# Patient Record
Sex: Female | Born: 1955
Health system: Southern US, Community
[De-identification: ages and names within clinical notes are randomized; demographics above are authoritative.]

## PROBLEM LIST (undated history)

## (undated) DIAGNOSIS — E785 Hyperlipidemia, unspecified: Secondary | ICD-10-CM

## (undated) DIAGNOSIS — J45909 Unspecified asthma, uncomplicated: Secondary | ICD-10-CM

## (undated) DIAGNOSIS — R5381 Other malaise: Secondary | ICD-10-CM

## (undated) DIAGNOSIS — E559 Vitamin D deficiency, unspecified: Secondary | ICD-10-CM

## (undated) DIAGNOSIS — R569 Unspecified convulsions: Secondary | ICD-10-CM

## (undated) DIAGNOSIS — I10 Essential (primary) hypertension: Secondary | ICD-10-CM

## (undated) DIAGNOSIS — I498 Other specified cardiac arrhythmias: Secondary | ICD-10-CM

## (undated) DIAGNOSIS — M1990 Unspecified osteoarthritis, unspecified site: Secondary | ICD-10-CM

## (undated) DIAGNOSIS — G40909 Epilepsy, unspecified, not intractable, without status epilepticus: Secondary | ICD-10-CM

## (undated) DIAGNOSIS — Z9289 Personal history of other medical treatment: Secondary | ICD-10-CM

## (undated) DIAGNOSIS — D649 Anemia, unspecified: Secondary | ICD-10-CM

## (undated) DIAGNOSIS — T7840XA Allergy, unspecified, initial encounter: Secondary | ICD-10-CM

## (undated) DIAGNOSIS — R001 Bradycardia, unspecified: Secondary | ICD-10-CM

## (undated) DIAGNOSIS — R5383 Other fatigue: Secondary | ICD-10-CM

## (undated) HISTORY — DX: Hyperlipidemia, unspecified: E78.5

## (undated) HISTORY — DX: Allergy, unspecified, initial encounter: T78.40XA

## (undated) HISTORY — DX: Unspecified osteoarthritis, unspecified site: M19.90

## (undated) HISTORY — DX: Other malaise: R53.83

## (undated) HISTORY — PX: VAGINAL HYSTERECTOMY: SUR661

## (undated) HISTORY — DX: Essential (primary) hypertension: I10

## (undated) HISTORY — DX: Anemia, unspecified: D64.9

## (undated) HISTORY — PX: APPENDECTOMY: SHX54

## (undated) HISTORY — PX: ABDOMINAL HYSTERECTOMY: SHX81

## (undated) HISTORY — DX: Vitamin D deficiency, unspecified: E55.9

## (undated) HISTORY — DX: Other specified cardiac arrhythmias: I49.8

## (undated) HISTORY — PX: BREAST EXCISIONAL BIOPSY: SUR124

## (undated) HISTORY — DX: Other malaise: R53.81

## (undated) HISTORY — DX: Unspecified convulsions: R56.9

## (undated) HISTORY — PX: BREAST LUMPECTOMY: SHX2

## (undated) HISTORY — DX: Bradycardia, unspecified: R00.1

---

## 1995-06-26 HISTORY — PX: BREAST LUMPECTOMY: SHX2

## 2004-06-25 DIAGNOSIS — I498 Other specified cardiac arrhythmias: Secondary | ICD-10-CM

## 2004-06-25 HISTORY — DX: Other specified cardiac arrhythmias: I49.8

## 2008-08-04 ENCOUNTER — Emergency Department (HOSPITAL_COMMUNITY): Admission: EM | Admit: 2008-08-04 | Discharge: 2008-08-04 | Payer: Self-pay | Admitting: Family Medicine

## 2008-09-03 ENCOUNTER — Emergency Department (HOSPITAL_COMMUNITY): Admission: EM | Admit: 2008-09-03 | Discharge: 2008-09-03 | Payer: Self-pay | Admitting: Family Medicine

## 2009-02-01 ENCOUNTER — Emergency Department (HOSPITAL_COMMUNITY): Admission: EM | Admit: 2009-02-01 | Discharge: 2009-02-01 | Payer: Self-pay | Admitting: Emergency Medicine

## 2009-02-16 ENCOUNTER — Ambulatory Visit: Payer: Self-pay | Admitting: Internal Medicine

## 2009-02-16 ENCOUNTER — Encounter (INDEPENDENT_AMBULATORY_CARE_PROVIDER_SITE_OTHER): Payer: Self-pay | Admitting: Family Medicine

## 2009-02-16 LAB — CONVERTED CEMR LAB
ALT: 23 units/L (ref 0–35)
CO2: 22 meq/L (ref 19–32)
Calcium: 9.2 mg/dL (ref 8.4–10.5)
Chloride: 105 meq/L (ref 96–112)
Cholesterol: 217 mg/dL — ABNORMAL HIGH (ref 0–200)
Sodium: 141 meq/L (ref 135–145)
Total Protein: 7.9 g/dL (ref 6.0–8.3)
VLDL: 40 mg/dL (ref 0–40)

## 2009-02-21 ENCOUNTER — Ambulatory Visit: Payer: Self-pay | Admitting: *Deleted

## 2009-03-03 ENCOUNTER — Ambulatory Visit: Payer: Self-pay | Admitting: Internal Medicine

## 2009-04-14 ENCOUNTER — Ambulatory Visit: Payer: Self-pay | Admitting: Family Medicine

## 2009-04-14 ENCOUNTER — Encounter (INDEPENDENT_AMBULATORY_CARE_PROVIDER_SITE_OTHER): Payer: Self-pay | Admitting: Family Medicine

## 2009-04-14 LAB — CONVERTED CEMR LAB
Albumin: 4.3 g/dL (ref 3.5–5.2)
Alkaline Phosphatase: 70 units/L (ref 39–117)
CO2: 23 meq/L (ref 19–32)
Glucose, Bld: 141 mg/dL — ABNORMAL HIGH (ref 70–99)
LDL Cholesterol: 122 mg/dL — ABNORMAL HIGH (ref 0–99)
Potassium: 4 meq/L (ref 3.5–5.3)
Sodium: 142 meq/L (ref 135–145)
Total Protein: 7.1 g/dL (ref 6.0–8.3)
Triglycerides: 106 mg/dL (ref <150)

## 2009-05-04 ENCOUNTER — Ambulatory Visit: Payer: Self-pay | Admitting: Internal Medicine

## 2009-05-06 ENCOUNTER — Ambulatory Visit: Payer: Self-pay | Admitting: Internal Medicine

## 2009-05-18 ENCOUNTER — Ambulatory Visit (HOSPITAL_COMMUNITY): Admission: RE | Admit: 2009-05-18 | Discharge: 2009-05-18 | Payer: Self-pay | Admitting: Internal Medicine

## 2009-05-30 ENCOUNTER — Encounter (INDEPENDENT_AMBULATORY_CARE_PROVIDER_SITE_OTHER): Payer: Self-pay | Admitting: Adult Health

## 2009-05-30 ENCOUNTER — Ambulatory Visit: Payer: Self-pay | Admitting: Internal Medicine

## 2009-05-30 LAB — CONVERTED CEMR LAB
CO2: 22 meq/L (ref 19–32)
Chloride: 106 meq/L (ref 96–112)
Creatinine, Ser: 0.71 mg/dL (ref 0.40–1.20)
Potassium: 3.7 meq/L (ref 3.5–5.3)

## 2009-07-22 ENCOUNTER — Ambulatory Visit: Payer: Self-pay | Admitting: Internal Medicine

## 2009-07-22 ENCOUNTER — Encounter (INDEPENDENT_AMBULATORY_CARE_PROVIDER_SITE_OTHER): Payer: Self-pay | Admitting: Adult Health

## 2009-07-22 LAB — CONVERTED CEMR LAB
Albumin: 4.3 g/dL (ref 3.5–5.2)
BUN: 8 mg/dL (ref 6–23)
CO2: 23 meq/L (ref 19–32)
Calcium: 9.1 mg/dL (ref 8.4–10.5)
Chloride: 103 meq/L (ref 96–112)
Cholesterol: 191 mg/dL (ref 0–200)
Glucose, Bld: 168 mg/dL — ABNORMAL HIGH (ref 70–99)
HDL: 53 mg/dL (ref >39)
Potassium: 3.7 meq/L (ref 3.5–5.3)
Triglycerides: 118 mg/dL (ref <150)

## 2010-05-23 ENCOUNTER — Ambulatory Visit: Payer: Self-pay | Admitting: Cardiology

## 2010-05-23 ENCOUNTER — Inpatient Hospital Stay (HOSPITAL_COMMUNITY): Admission: AC | Admit: 2010-05-23 | Discharge: 2010-05-26 | Payer: Self-pay

## 2010-05-25 ENCOUNTER — Encounter: Payer: Self-pay | Admitting: Cardiology

## 2010-09-05 LAB — GLUCOSE, CAPILLARY
Glucose-Capillary: 102 mg/dL — ABNORMAL HIGH (ref 70–99)
Glucose-Capillary: 228 mg/dL — ABNORMAL HIGH (ref 70–99)
Glucose-Capillary: 257 mg/dL — ABNORMAL HIGH (ref 70–99)
Glucose-Capillary: 266 mg/dL — ABNORMAL HIGH (ref 70–99)
Glucose-Capillary: 354 mg/dL — ABNORMAL HIGH (ref 70–99)
Glucose-Capillary: 167 mg/dL — ABNORMAL HIGH (ref 70–99)
Glucose-Capillary: 178 mg/dL — ABNORMAL HIGH (ref 70–99)
Glucose-Capillary: 227 mg/dL — ABNORMAL HIGH (ref 70–99)
Glucose-Capillary: 308 mg/dL — ABNORMAL HIGH (ref 70–99)
Glucose-Capillary: 316 mg/dL — ABNORMAL HIGH (ref 70–99)

## 2010-09-05 LAB — POCT I-STAT, CHEM 8
BUN: 9 mg/dL (ref 6–23)
Calcium, Ion: 1.09 mmol/L — ABNORMAL LOW (ref 1.12–1.32)
Glucose, Bld: 311 mg/dL — ABNORMAL HIGH (ref 70–99)
HCT: 43 % (ref 36.0–46.0)
TCO2: 22 mmol/L (ref 0–100)

## 2010-09-05 LAB — CARDIAC PANEL(CRET KIN+CKTOT+MB+TROPI)
CK, MB: 6.5 ng/mL (ref 0.3–4.0)
CK, MB: 9.1 ng/mL (ref 0.3–4.0)
Relative Index: 1.4 (ref 0.0–2.5)
Total CK: 1045 U/L — ABNORMAL HIGH (ref 7–177)
Total CK: 951 U/L — ABNORMAL HIGH (ref 7–177)

## 2010-09-05 LAB — BASIC METABOLIC PANEL
BUN: 9 mg/dL (ref 6–23)
CO2: 26 mEq/L (ref 19–32)
Calcium: 8.2 mg/dL — ABNORMAL LOW (ref 8.4–10.5)
Chloride: 107 mEq/L (ref 96–112)
Creatinine, Ser: 0.57 mg/dL (ref 0.4–1.2)
GFR calc Af Amer: >60 mL/min (ref >60)
GFR calc non Af Amer: >60 mL/min (ref >60)
Glucose, Bld: 263 mg/dL — ABNORMAL HIGH (ref 70–99)
Potassium: 4.3 mEq/L (ref 3.5–5.1)
Sodium: 139 mEq/L (ref 135–145)
Sodium: 139 mEq/L (ref 135–145)

## 2010-09-05 LAB — COMPREHENSIVE METABOLIC PANEL
Albumin: 3.5 g/dL (ref 3.5–5.2)
Alkaline Phosphatase: 99 U/L (ref 39–117)
BUN: 9 mg/dL (ref 6–23)
Calcium: 9.1 mg/dL (ref 8.4–10.5)
Glucose, Bld: 302 mg/dL — ABNORMAL HIGH (ref 70–99)
Potassium: 3.4 meq/L — ABNORMAL LOW (ref 3.5–5.1)
Total Protein: 7.2 g/dL (ref 6.0–8.3)

## 2010-09-05 LAB — CBC
HCT: 39.9 % (ref 36.0–46.0)
MCH: 25.5 pg — ABNORMAL LOW (ref 26.0–34.0)
MCHC: 32.3 g/dL (ref 30.0–36.0)
MCHC: 32.3 g/dL (ref 30.0–36.0)
MCV: 78.9 fL (ref 78.0–100.0)
MCV: 79 fL (ref 78.0–100.0)
Platelets: 187 10*3/uL (ref 150–400)
Platelets: 227 10*3/uL (ref 150–400)
RBC: 4.27 MIL/uL (ref 3.87–5.11)
RDW: 14 % (ref 11.5–15.5)
WBC: 11.1 10*3/uL — ABNORMAL HIGH (ref 4.0–10.5)

## 2010-09-05 LAB — PROTIME-INR
INR: 0.98 (ref 0.00–1.49)
Prothrombin Time: 13.2 s (ref 11.6–15.2)

## 2010-09-05 LAB — LIPID PANEL
Triglycerides: 132 mg/dL (ref <150)
VLDL: 26 mg/dL (ref 0–40)

## 2010-09-05 LAB — CK TOTAL AND CKMB (NOT AT ARMC)
CK, MB: 10.5 ng/mL (ref 0.3–4.0)
Relative Index: 1.6 (ref 0.0–2.5)
Total CK: 639 U/L — ABNORMAL HIGH (ref 7–177)

## 2010-09-05 LAB — HEMOGLOBIN A1C
Hgb A1c MFr Bld: 12.8 % — ABNORMAL HIGH (ref <5.7)
Mean Plasma Glucose: 321 mg/dL — ABNORMAL HIGH (ref <117)

## 2010-09-05 LAB — BRAIN NATRIURETIC PEPTIDE: Pro B Natriuretic peptide (BNP): <30 pg/mL (ref 0.0–100.0)

## 2010-09-05 LAB — LACTIC ACID, PLASMA: Lactic Acid, Venous: 6.2 mmol/L — ABNORMAL HIGH (ref 0.5–2.2)

## 2010-10-10 LAB — POCT I-STAT, CHEM 8
Calcium, Ion: 1.14 mmol/L (ref 1.12–1.32)
Chloride: 105 mEq/L (ref 96–112)
HCT: 42 % (ref 36.0–46.0)
Sodium: 141 mEq/L (ref 135–145)
TCO2: 26 mmol/L (ref 0–100)

## 2011-05-07 ENCOUNTER — Encounter: Payer: Self-pay | Admitting: *Deleted

## 2011-05-07 ENCOUNTER — Emergency Department (HOSPITAL_COMMUNITY)
Admission: EM | Admit: 2011-05-07 | Discharge: 2011-05-07 | Disposition: A | Discharge status: Home / Self Care | Payer: 59 | Attending: Emergency Medicine | Admitting: Emergency Medicine

## 2011-05-07 ENCOUNTER — Other Ambulatory Visit: Payer: Self-pay

## 2011-05-07 DIAGNOSIS — Z794 Long term (current) use of insulin: Secondary | ICD-10-CM | POA: Insufficient documentation

## 2011-05-07 DIAGNOSIS — E119 Type 2 diabetes mellitus without complications: Secondary | ICD-10-CM | POA: Insufficient documentation

## 2011-05-07 DIAGNOSIS — Z9114 Patient's other noncompliance with medication regimen: Secondary | ICD-10-CM

## 2011-05-07 DIAGNOSIS — Z79899 Other long term (current) drug therapy: Secondary | ICD-10-CM | POA: Insufficient documentation

## 2011-05-07 DIAGNOSIS — Z91199 Patient's noncompliance with other medical treatment and regimen due to unspecified reason: Secondary | ICD-10-CM | POA: Insufficient documentation

## 2011-05-07 DIAGNOSIS — Z9119 Patient's noncompliance with other medical treatment and regimen: Secondary | ICD-10-CM | POA: Insufficient documentation

## 2011-05-07 DIAGNOSIS — F29 Unspecified psychosis not due to a substance or known physiological condition: Secondary | ICD-10-CM | POA: Insufficient documentation

## 2011-05-07 DIAGNOSIS — G40909 Epilepsy, unspecified, not intractable, without status epilepticus: Secondary | ICD-10-CM | POA: Insufficient documentation

## 2011-05-07 DIAGNOSIS — I10 Essential (primary) hypertension: Secondary | ICD-10-CM | POA: Insufficient documentation

## 2011-05-07 DIAGNOSIS — R569 Unspecified convulsions: Secondary | ICD-10-CM

## 2011-05-07 HISTORY — DX: Epilepsy, unspecified, not intractable, without status epilepticus: G40.909

## 2011-05-07 LAB — URINALYSIS, ROUTINE W REFLEX MICROSCOPIC
Bilirubin Urine: NEGATIVE
Glucose, UA: 250 mg/dL — AB
Hgb urine dipstick: NEGATIVE
Specific Gravity, Urine: 1.021 (ref 1.005–1.030)
Urobilinogen, UA: 0.2 mg/dL (ref 0.0–1.0)
pH: 6 (ref 5.0–8.0)

## 2011-05-07 LAB — URINE MICROSCOPIC-ADD ON

## 2011-05-07 LAB — POCT I-STAT, CHEM 8
BUN: 9 mg/dL (ref 6–23)
Chloride: 106 mEq/L (ref 96–112)
Creatinine, Ser: 0.7 mg/dL (ref 0.50–1.10)
Glucose, Bld: 171 mg/dL — ABNORMAL HIGH (ref 70–99)
Potassium: 3.2 mEq/L — ABNORMAL LOW (ref 3.5–5.1)
Sodium: 141 mEq/L (ref 135–145)

## 2011-05-07 MED ORDER — SODIUM CHLORIDE 0.9 % IV SOLN
Freq: Once | INTRAVENOUS | Status: AC
  Administered 2011-05-07: 11:00:00 via INTRAVENOUS

## 2011-05-07 MED ORDER — LEVETIRACETAM 500 MG PO TABS: 500.0000 mg | ORAL_TABLET | Freq: Two times a day (BID) | ORAL | Status: DC

## 2011-05-07 MED ORDER — LEVETIRACETAM 500 MG PO TABS
500.0000 mg | ORAL_TABLET | Freq: Once | ORAL | Status: AC
  Administered 2011-05-07: 500 mg via ORAL
  Filled 2011-05-07: qty 1

## 2011-05-07 NOTE — ED Notes (Signed)
[facility] PTA 167

## 2011-05-07 NOTE — ED Notes (Signed)
Patients ekg given to dr. Patrica Duel.

## 2011-05-07 NOTE — ED Provider Notes (Signed)
Medical screening examination/treatment/procedure(s) were performed by non-physician practitioner and as supervising physician I was immediately available for consultation/collaboration.   Jerrell Hart A. Patrica Duel, MD 05/07/11 1556

## 2011-05-07 NOTE — ED Notes (Signed)
Family at bedside. 

## 2011-05-07 NOTE — ED Notes (Signed)
While at work, she yelled out, fell from standing position & had seizure. Witnessed

## 2011-05-07 NOTE — ED Notes (Signed)
Patient is resting comfortably. 

## 2011-05-07 NOTE — ED Notes (Signed)
Patient is resting comfortably. No sei

## 2011-05-07 NOTE — ED Provider Notes (Signed)
History     CSN: [id number] Arrival date & time: 05/07/2011 10:21 AM   First MD Initiated Contact with Patient 05/07/11 1043      Chief Complaint  Patient presents with  . Seizures    (Consider location/radiation/quality/duration/timing/severity/associated sxs/prior treatment) Patient is a 55 y.o. female presenting with seizures. The history is provided by the patient.  Seizures  This is a recurrent problem. The current episode started 1 to 2 hours ago. The problem has been gradually improving. There was 1 (The patient has a history of seizures and has had no Keppra since May of this year. This is the first seizure in that time. ) seizure. Associated symptoms include confusion. The seizures did not continue in the ED. Possible causes include missed seizure meds. There has been no fever.    Past Medical History  Diagnosis Date  . Diabetes mellitus   . Seizure disorder     No past surgical history on file.  No family history on file.  History  Substance Use Topics  . Smoking status: Never Smoker   . Smokeless tobacco: Not on file  . Alcohol Use: No    OB History    Grav Para Term Preterm Abortions TAB SAB Ect Mult Living                  Review of Systems  Constitutional: Negative for fever and chills.  HENT: Negative.   Respiratory: Negative.   Cardiovascular: Negative.   Gastrointestinal: Negative.   Musculoskeletal: Negative.   Skin: Negative.   Neurological: Positive for seizures.  Psychiatric/Behavioral: Positive for confusion.    Allergies  Review of patient's allergies indicates not on file.  Home Medications   Current Outpatient Rx  Name Route Sig Dispense Refill  . INSULIN GLARGINE 100 UNIT/ML [place] SOLN Subcutaneous Inject into the skin at bedtime. Per sliding scale     . LEVETIRACETAM 500 MG PO TABS Oral Take 500 mg by mouth 2 (two) times daily.      Marland Kitchen LISINOPRIL 10 MG PO TABS Oral Take 10 mg by mouth daily.      Marland Kitchen METOPROLOL TARTRATE 25 MG PO  TABS Oral Take 25 mg by mouth 2 (two) times daily.      Marland Kitchen NAPROXEN 500 MG PO TABS Oral Take 500 mg by mouth 2 (two) times daily with a meal.      . SAXAGLIPTIN-METFORMIN 2.10-998 MG PO TB24 Oral Take 2 tablets by mouth every evening. With meals       BP 144/94  Pulse 108  Temp(Src) 98 F (36.7 C) (Oral)  Resp 17  SpO2 100%  Physical Exam  Constitutional: She is oriented to person, place, and time. She appears well-developed and well-nourished.  HENT:  Head: Normocephalic.  Eyes: Pupils are equal, round, and reactive to light.  Neck: Normal range of motion. Neck supple.  Cardiovascular: Normal rate and regular rhythm.   Pulmonary/Chest: Effort normal and breath sounds normal.  Abdominal: Soft. Bowel sounds are normal. There is no tenderness. There is no rebound and no guarding.  Musculoskeletal: Normal range of motion.  Neurological: She is alert and oriented to person, place, and time. She has normal strength and normal reflexes. No cranial nerve deficit or sensory deficit. She displays a negative Romberg sign. Coordination normal.  Skin: Skin is warm and dry. No rash noted.  Psychiatric: She has a normal mood and affect.    ED Course  Procedures (including critical care time)  Labs Reviewed  POCT  I-STAT, CHEM 8 - Abnormal; Notable for the following:    Potassium 3.2 (*)    Glucose, Bld 171 (*)    All other components within normal limits  I-STAT, CHEM 8  URINALYSIS, ROUTINE W REFLEX MICROSCOPIC   No results found.   No diagnosis found.    MDM   Date: 05/07/2011  Rate: 117  Rhythm: sinus tachycardia  QRS Axis: normal  Intervals: normal  ST/T Wave abnormalities: nonspecific T wave changes  Conduction Disutrbances:none  Narrative Interpretation:   Old EKG Reviewed: none available Results for orders placed during the hospital encounter of 05/07/11  POCT I-STAT, CHEM 8      Component Value Range   Sodium 141  135 - 145 (mEq/L)   Potassium 3.2 (*) 3.5 - 5.1  (mEq/L)   Chloride 106  96 - 112 (mEq/L)   BUN 9  6 - 23 (mg/dL)   Creatinine, Ser 1.61  0.50 - 1.10 (mg/dL)   Glucose, Bld 096 (*) 70 - 99 (mg/dL)   Calcium, Ion 0.45  4.09 - 1.32 (mmol/L)   TCO2 20  0 - 100 (mmol/L)   Hemoglobin 13.3  12.0 - 15.0 (g/dL)   HCT 81.1  91.4 - 78.2 (%)     Labs normal; no seizure activity in ED. Patient can be discharged home with Keppra Rx. Referred to PCP and to neurology.        Rodena Medin, PA 05/07/11 [phone]

## 2011-05-07 NOTE — ED Notes (Signed)
Witnessed tonic clonic seizure lasting approx 45 seconds. Pt denies injury or pain. No oral trauma or urinary incontinence.  Pt reports has not had meds for "a few months".

## 2011-05-07 NOTE — ED Notes (Signed)
Attempting to obtain info regarding meds, history, allergies & to notify family

## 2011-05-07 NOTE — ED Notes (Signed)
PCP not admitting provider, entered in error

## 2011-05-29 ENCOUNTER — Other Ambulatory Visit: Payer: Self-pay | Admitting: Internal Medicine

## 2011-05-29 DIAGNOSIS — Z1231 Encounter for screening mammogram for malignant neoplasm of breast: Secondary | ICD-10-CM

## 2011-06-08 ENCOUNTER — Ambulatory Visit: Payer: 59

## 2012-09-11 ENCOUNTER — Other Ambulatory Visit: Payer: Self-pay | Admitting: *Deleted

## 2012-09-11 MED ORDER — HYDROCHLOROTHIAZIDE 25 MG PO TABS: 25.0000 mg | ORAL_TABLET | Freq: Every day | ORAL | Status: DC

## 2012-09-11 MED ORDER — LISINOPRIL 20 MG PO TABS: ORAL_TABLET | ORAL | Status: DC

## 2012-09-16 ENCOUNTER — Other Ambulatory Visit: Payer: Self-pay | Admitting: Geriatric Medicine

## 2012-09-16 MED ORDER — HYDROCHLOROTHIAZIDE 25 MG PO TABS: 25.0000 mg | ORAL_TABLET | Freq: Every day | ORAL | Status: DC

## 2012-09-16 MED ORDER — LISINOPRIL 20 MG PO TABS: ORAL_TABLET | ORAL | Status: DC

## 2012-11-04 ENCOUNTER — Ambulatory Visit: Payer: Self-pay | Admitting: Internal Medicine

## 2012-12-10 ENCOUNTER — Other Ambulatory Visit: Payer: Self-pay | Admitting: Internal Medicine

## 2012-12-12 ENCOUNTER — Other Ambulatory Visit: Payer: Self-pay | Admitting: Geriatric Medicine

## 2012-12-29 ENCOUNTER — Other Ambulatory Visit: Payer: Self-pay | Admitting: *Deleted

## 2012-12-29 ENCOUNTER — Encounter: Payer: Self-pay | Admitting: *Deleted

## 2012-12-30 ENCOUNTER — Ambulatory Visit: Payer: Self-pay | Admitting: Internal Medicine

## 2013-01-12 ENCOUNTER — Encounter: Payer: Self-pay | Admitting: *Deleted

## 2013-01-12 ENCOUNTER — Other Ambulatory Visit: Payer: Self-pay | Admitting: Internal Medicine

## 2013-01-13 ENCOUNTER — Ambulatory Visit (INDEPENDENT_AMBULATORY_CARE_PROVIDER_SITE_OTHER): Payer: 59 | Admitting: Internal Medicine

## 2013-01-13 ENCOUNTER — Other Ambulatory Visit: Payer: Self-pay | Admitting: Internal Medicine

## 2013-01-13 ENCOUNTER — Encounter: Payer: Self-pay | Admitting: Internal Medicine

## 2013-01-13 VITALS — BP 142/90 | HR 78 | Temp 98.2°F | Resp 14 | Wt 176.2 lb

## 2013-01-13 DIAGNOSIS — D649 Anemia, unspecified: Secondary | ICD-10-CM

## 2013-01-13 DIAGNOSIS — M549 Dorsalgia, unspecified: Secondary | ICD-10-CM

## 2013-01-13 DIAGNOSIS — G40909 Epilepsy, unspecified, not intractable, without status epilepticus: Secondary | ICD-10-CM

## 2013-01-13 DIAGNOSIS — E119 Type 2 diabetes mellitus without complications: Secondary | ICD-10-CM

## 2013-01-13 DIAGNOSIS — I10 Essential (primary) hypertension: Secondary | ICD-10-CM

## 2013-01-13 MED ORDER — LEVETIRACETAM 500 MG PO TABS: 500.0000 mg | ORAL_TABLET | Freq: Two times a day (BID) | ORAL | Status: DC

## 2013-01-13 MED ORDER — HYDROCHLOROTHIAZIDE 25 MG PO TABS: 25.0000 mg | ORAL_TABLET | Freq: Two times a day (BID) | ORAL | Status: DC

## 2013-01-13 MED ORDER — TRAMADOL HCL 50 MG PO TABS: 50.0000 mg | ORAL_TABLET | Freq: Two times a day (BID) | ORAL | Status: DC | PRN

## 2013-01-13 NOTE — Progress Notes (Signed)
Patient ID: Sarah Brock, female   DOB: [birth year], 57 y.o.   MRN: [id number]   Code Status: full code  Allergies  Allergen Reactions  . Shrimp (Shellfish Allergy)     Chief Complaint  Patient presents with  . Medical Managment of Chronic Issues    HPI:  57 y/o female patient is here for routine visit. She remains seizure free. Se has not checked her bp at home. bp elevated this office visit. Blood sugar controlled and , 200. Compliant with her meds  Review of Systems  Constitutional: Negative for fever, chills, weight loss, malaise/fatigue and diaphoresis.  HENT: Negative for congestion.   Eyes: Negative for blurred vision.  Respiratory: Negative for cough and shortness of breath.   Cardiovascular: Negative for chest pain and leg swelling.  Gastrointestinal: Negative for heartburn, abdominal pain, diarrhea and constipation.  Genitourinary: Negative for dysuria and flank pain.  Musculoskeletal: Negative for myalgias and falls.  Skin: Negative for itching and rash.  Neurological: Negative for dizziness, sensory change, weakness and headaches.  Psychiatric/Behavioral: Negative for depression and memory loss. The patient is not nervous/anxious.      Past Medical History  Diagnosis Date  . Diabetes mellitus   . Seizure disorder   . Other specified cardiac dysrhythmias(427.89)   . Osteoarthrosis, unspecified whether generalized or localized, unspecified site   . Unspecified vitamin D deficiency   . Other and unspecified hyperlipidemia   . Anemia, unspecified   . Unspecified essential hypertension   . Other convulsions   . Other malaise and fatigue    Past Surgical History  Procedure Laterality Date  . Abdominal hysterectomy    . Breast lumpectomy Right 1997   Social History:   reports that she has never smoked. She does not have any smokeless tobacco history on file. She reports that she does not drink alcohol or use illicit drugs.  Family History  Problem Relation  Age of Onset  . Cancer Father     Esophageal    Medications: Patient's Medications  New Prescriptions   No medications on file  Previous Medications   ASPIRIN 81 MG TABLET    Take 81 mg by mouth daily.   HYDROCHLOROTHIAZIDE (HYDRODIURIL) 25 MG TABLET    Take 1 tablet (25 mg total) by mouth daily.   LEVETIRACETAM (KEPPRA) 500 MG TABLET    Take 1 tablet (500 mg total) by mouth every 12 (twelve) hours.   LISINOPRIL (PRINIVIL,ZESTRIL) 20 MG TABLET    Take one tablet by mouth once daily for blood pressure   METFORMIN (GLUCOPHAGE-XR) 500 MG 24 HR TABLET    TAKE 1 TABLET BY MOUTH DAILY   NAPROXEN (NAPROSYN) 500 MG TABLET    Take 500 mg by mouth 2 (two) times daily with a meal.    Modified Medications   No medications on file  Discontinued Medications   INSULIN GLARGINE (LANTUS) 100 UNIT/ML INJECTION    Inject into the skin at bedtime. Per sliding scale    METOPROLOL TARTRATE (LOPRESSOR) 25 MG TABLET    Take 25 mg by mouth 2 (two) times daily.     SAXAGLIPTIN-METFORMIN (KOMBIGLYZE XR) 2.10-998 MG TB24    Take 2 tablets by mouth every evening. With meals       Filed Vitals:   01/13/13 1555  BP: 142/90  Pulse: 78  Temp: 98.2 F (36.8 C)  TempSrc: Oral  Resp: 14  Weight: 176 lb 3.2 oz (79.924 kg)   Physical Exam  Constitutional: She is oriented to person,  place, and time. She appears well-developed and well-nourished. No distress.  HENT:  Head: Normocephalic and atraumatic.  Mouth/Throat: Oropharynx is clear and moist. No oropharyngeal exudate.  Eyes: Conjunctivae and EOM are normal. Pupils are equal, round, and reactive to light.  Neck: Normal range of motion. Neck supple. No JVD present. No thyromegaly present.  Cardiovascular: Normal rate, regular rhythm, normal heart sounds and intact distal pulses.   No murmur heard. Pulmonary/Chest: Effort normal and breath sounds normal. No respiratory distress. She has no wheezes. She has no rales. She exhibits no tenderness.  Abdominal:  Soft. Bowel sounds are normal. She exhibits no distension and no mass.  Musculoskeletal: Normal range of motion. She exhibits no edema and no tenderness.  Lymphadenopathy:    She has no cervical adenopathy.  Neurological: She is alert and oriented to person, place, and time. No cranial nerve deficit.  Skin: Skin is warm and dry. She is not diaphoretic. No erythema.  Psychiatric: She has a normal mood and affect. Her behavior is normal.     Assessment/Plan  Seizure- continue keppra at 500 mg bid for now. Consider decreasing the dose next visit if remains seizure free  Dm- recheck a1c today. Continue metformin for now and monitor cbg. On lisinopril and asa. ldl at goal last visit. Recheck flp  htn- bp remains elevated on recheck. Goal , 130/80. Will change hctz to 25 mg bid and continue lisinopril  Back pain- will d/c naprosyn as this can also elevate the bp. Will start tramadol 50 mg every 12 hr prn for now and reassess  Labs/tests ordered- a1c, cmp, cbc

## 2013-01-23 LAB — CBC WITH DIFFERENTIAL
Basos: 1 % (ref 0–3)
Eos: 6 % — ABNORMAL HIGH (ref 0–5)
Hemoglobin: 10.9 g/dL — ABNORMAL LOW (ref 11.1–15.9)
Immature Grans (Abs): 0 10*3/uL (ref 0.0–0.1)
Immature Granulocytes: 0 % (ref 0–2)
Lymphs: 34 % (ref 14–46)
Neutrophils Relative %: 53 % (ref 40–74)
Platelets: 271 10*3/uL (ref 150–379)
RBC: 4.38 x10E6/uL (ref 3.77–5.28)
WBC: 6.6 10*3/uL (ref 3.4–10.8)

## 2013-01-23 LAB — COMPREHENSIVE METABOLIC PANEL
ALT: 22 IU/L (ref 0–32)
AST: 26 IU/L (ref 0–40)
Albumin/Globulin Ratio: 1.4 (ref 1.1–2.5)
CO2: 27 mmol/L (ref 18–29)
Calcium: 9.6 mg/dL (ref 8.7–10.2)
Creatinine, Ser: 0.89 mg/dL (ref 0.57–1.00)
GFR calc non Af Amer: 72 mL/min/{1.73_m2} (ref >59)
Glucose: 93 mg/dL (ref 65–99)
Potassium: 3.7 mmol/L (ref 3.5–5.2)
Sodium: 144 mmol/L (ref 134–144)
Total Protein: 7.1 g/dL (ref 6.0–8.5)

## 2013-02-11 ENCOUNTER — Telehealth: Payer: Self-pay | Admitting: Geriatric Medicine

## 2013-02-11 ENCOUNTER — Other Ambulatory Visit: Payer: Self-pay | Admitting: Geriatric Medicine

## 2013-02-11 DIAGNOSIS — D649 Anemia, unspecified: Secondary | ICD-10-CM

## 2013-02-12 ENCOUNTER — Other Ambulatory Visit: Payer: Self-pay

## 2013-02-18 ENCOUNTER — Other Ambulatory Visit: Payer: 59

## 2013-02-18 DIAGNOSIS — D649 Anemia, unspecified: Secondary | ICD-10-CM

## 2013-02-19 LAB — IRON AND TIBC
TIBC: 272 ug/dL (ref 250–450)
UIBC: 203 ug/dL (ref 150–375)

## 2013-02-19 LAB — FERRITIN: Ferritin: 32 ng/mL (ref 15–150)

## 2013-03-19 ENCOUNTER — Other Ambulatory Visit: Payer: Self-pay | Admitting: *Deleted

## 2013-03-19 MED ORDER — HYDROCHLOROTHIAZIDE 25 MG PO TABS: ORAL_TABLET | ORAL | Status: DC

## 2013-03-19 MED ORDER — LISINOPRIL 20 MG PO TABS: ORAL_TABLET | ORAL | Status: DC

## 2013-04-15 ENCOUNTER — Ambulatory Visit (INDEPENDENT_AMBULATORY_CARE_PROVIDER_SITE_OTHER): Payer: 59 | Admitting: Internal Medicine

## 2013-04-15 ENCOUNTER — Encounter: Payer: Self-pay | Admitting: Internal Medicine

## 2013-04-15 VITALS — BP 136/84 | HR 86 | Temp 98.0°F | Resp 16 | Ht 62.25 in | Wt 172.2 lb

## 2013-04-15 DIAGNOSIS — G40909 Epilepsy, unspecified, not intractable, without status epilepticus: Secondary | ICD-10-CM

## 2013-04-15 DIAGNOSIS — I10 Essential (primary) hypertension: Secondary | ICD-10-CM

## 2013-04-15 DIAGNOSIS — K5900 Constipation, unspecified: Secondary | ICD-10-CM

## 2013-04-15 DIAGNOSIS — E119 Type 2 diabetes mellitus without complications: Secondary | ICD-10-CM

## 2013-04-15 DIAGNOSIS — D649 Anemia, unspecified: Secondary | ICD-10-CM

## 2013-04-15 NOTE — Progress Notes (Signed)
Patient ID: Sarah Brock, female   DOB: [birth year], 57 y.o.   MRN: [id number]  Chief Complaint  Patient presents with  . Medical Managment of Chronic Issues    3 month f/u, labs printed    Allergies  Allergen Reactions  . Shrimp [Shellfish Allergy]    HPI:   57 y/o female patient is here for routine visit. She remains seizure free. bp well controlled in office today. She mentions about a small palpable lump in in RUQ few days back while straining to have a bowel movement and this was reducible. She has not felt the lump thereafter. Denies any pain or tenderness associated with this  Review of Systems  Constitutional: Negative for fever, chills, weight loss, malaise/fatigue and diaphoresis.  HENT: Negative for congestion.   Eyes: Negative for blurred vision.  Respiratory: Negative for cough and shortness of breath.   Cardiovascular: Negative for chest pain and leg swelling.  Gastrointestinal: Negative for heartburn, abdominal pain, diarrhea and constipation.  Genitourinary: Negative for dysuria and flank pain.  Musculoskeletal: Negative for myalgias and falls.  Skin: Negative for itching and rash.  Neurological: Negative for dizziness, sensory change, weakness and headaches.  Psychiatric/Behavioral: Negative for depression and memory loss. The patient is not nervous/anxious.    Past Medical History  Diagnosis Date  . Diabetes mellitus   . Seizure disorder   . Other specified cardiac dysrhythmias(427.89)   . Osteoarthrosis, unspecified whether generalized or localized, unspecified site   . Unspecified vitamin D deficiency   . Other and unspecified hyperlipidemia   . Anemia, unspecified   . Unspecified essential hypertension   . Other convulsions   . Other malaise and fatigue    Past Surgical History  Procedure Laterality Date  . Abdominal hysterectomy    . Breast lumpectomy Right 1997   Current Outpatient Prescriptions on File Prior to Visit  Medication Sig Dispense  Refill  . aspirin 81 MG tablet Take 81 mg by mouth daily.      . hydrochlorothiazide (HYDRODIURIL) 25 MG tablet Take one tablet by mouth twice daily to control blood pressure  60 tablet  5  . levETIRAcetam (KEPPRA) 500 MG tablet Take 1 tablet (500 mg total) by mouth every 12 (twelve) hours.  60 tablet  6  . lisinopril (PRINIVIL,ZESTRIL) 20 MG tablet Take one tablet by mouth once daily for blood pressure  30 tablet  5  . metFORMIN (GLUCOPHAGE-XR) 500 MG 24 hr tablet TAKE 1 TABLET BY MOUTH DAILY  30 tablet  5  . traMADol (ULTRAM) 50 MG tablet Take 1 tablet (50 mg total) by mouth every 12 (twelve) hours as needed for pain.  60 tablet  0   No current facility-administered medications on file prior to visit.    Physical Exam  BP 136/84  Pulse 86  Temp(Src) 98 F (36.7 C) (Oral)  Resp 16  Ht 5' 2.25" (1.581 m)  Wt 172 lb 3.2 oz (78.109 kg)  BMI 31.25 kg/m2  SpO2 96%  Constitutional: She is oriented to person, place, and time. She appears well-developed and well-nourished. No distress.  HENT:   Head: Normocephalic and atraumatic.   Mouth/Throat: Oropharynx is clear and moist. No oropharyngeal exudate.  Eyes: Conjunctivae and EOM are normal. Pupils are equal, round, and reactive to light.  Neck: Normal range of motion. Neck supple. No JVD present. No thyromegaly present.  Cardiovascular: Normal rate, regular rhythm, normal heart sounds and intact distal pulses.    No murmur heard. Pulmonary/Chest: Effort normal and breath  sounds normal. No respiratory distress. She has no wheezes. She has no rales. She exhibits no tenderness.  Abdominal: Soft. Bowel sounds are normal. She exhibits no distension and no mass.  Musculoskeletal: Normal range of motion. She exhibits no edema and no tenderness.  Lymphadenopathy:    She has no cervical adenopathy.  Neurological: She is alert and oriented to person, place, and time. No cranial nerve deficit.  Skin: Skin is warm and dry. She is not diaphoretic. No  erythema.  Psychiatric: She has a normal mood and affect. Her behavior is normal.   Labs- CBC    Component Value Date/Time   WBC 6.6 01/13/2013 1637   WBC 8.6 05/24/2010 0615   RBC 4.38 01/13/2013 1637   RBC 4.27 05/24/2010 0615   HGB 10.9* 01/13/2013 1637   HCT 33.9* 01/13/2013 1637   PLT 271 01/13/2013 1637   MCV 77* 01/13/2013 1637   MCH 24.9* 01/13/2013 1637   MCH 25.5* 05/24/2010 0615   MCHC 32.2 01/13/2013 1637   MCHC 32.3 05/24/2010 0615   RDW 14.4 01/13/2013 1637   RDW 13.9 05/24/2010 0615   LYMPHSABS 2.3 01/13/2013 1637   EOSABS 0.4 01/13/2013 1637   BASOSABS 0.1 01/13/2013 1637    CMP     Component Value Date/Time   NA 144 01/13/2013 1637   NA 141 05/07/2011 1225   K 3.7 01/13/2013 1637   CL 104 01/13/2013 1637   CO2 27 01/13/2013 1637   GLUCOSE 93 01/13/2013 1637   GLUCOSE 171* 05/07/2011 1225   BUN 11 01/13/2013 1637   BUN 9 05/07/2011 1225   CREATININE 0.89 01/13/2013 1637   CALCIUM 9.6 01/13/2013 1637   PROT 7.1 01/13/2013 1637   PROT 7.2 05/23/2010 1603   ALBUMIN 3.5 05/23/2010 1603   AST 26 01/13/2013 1637   ALT 22 01/13/2013 1637   ALKPHOS 64 01/13/2013 1637   BILITOT 0.2 01/13/2013 1637   GFRNONAA 72 01/13/2013 1637   GFRAA 83 01/13/2013 1637    Assessment/Plan  Hypertension- bp well controlled. Continue lisinopril 20 mg daily and hctz 25 mg daily  Seizure- continue keppra at 500 mg bid for now.   Dm- recheck a1c today. Continue metformin for now and monitor cbg. On lisinopril and asa. ldl at goal last visit. Normal foot exam. Check urine microalbumin  Constipation- encouraged fiber intake for now and adequate water intake

## 2013-04-16 LAB — HEMOGLOBIN A1C: Hgb A1c MFr Bld: 7 % — ABNORMAL HIGH (ref 4.8–5.6)

## 2013-04-16 LAB — MICROALBUMIN / CREATININE URINE RATIO
MICROALB/CREAT RATIO: 5.9 mg/g creat (ref 0.0–30.0)
Microalbumin, Urine: 7.7 ug/mL (ref 0.0–17.0)

## 2013-04-17 ENCOUNTER — Encounter: Payer: Self-pay | Admitting: *Deleted

## 2013-04-17 ENCOUNTER — Other Ambulatory Visit: Payer: Self-pay | Admitting: *Deleted

## 2013-04-17 MED ORDER — METFORMIN HCL 500 MG PO TABS: 500.0000 mg | ORAL_TABLET | Freq: Two times a day (BID) | ORAL | Status: DC

## 2013-05-07 ENCOUNTER — Encounter: Payer: Self-pay | Admitting: Internal Medicine

## 2013-06-24 ENCOUNTER — Other Ambulatory Visit: Payer: Self-pay | Admitting: *Deleted

## 2013-06-24 DIAGNOSIS — E119 Type 2 diabetes mellitus without complications: Secondary | ICD-10-CM

## 2013-06-24 MED ORDER — GLUCOSE BLOOD VI STRP: ORAL_STRIP | Status: DC

## 2013-06-24 NOTE — Telephone Encounter (Signed)
rx sent to the pharmacy. 

## 2013-08-19 ENCOUNTER — Ambulatory Visit: Payer: Self-pay | Admitting: Internal Medicine

## 2013-09-03 ENCOUNTER — Other Ambulatory Visit: Payer: Self-pay | Admitting: Internal Medicine

## 2013-10-22 ENCOUNTER — Other Ambulatory Visit: Payer: Self-pay | Admitting: Internal Medicine

## 2013-10-23 ENCOUNTER — Other Ambulatory Visit: Payer: Self-pay | Admitting: *Deleted

## 2013-10-23 ENCOUNTER — Telehealth: Payer: Self-pay | Admitting: *Deleted

## 2013-10-23 MED ORDER — LEVETIRACETAM 500 MG PO TABS: 500.0000 mg | ORAL_TABLET | Freq: Two times a day (BID) | ORAL | Status: DC

## 2013-10-23 NOTE — Telephone Encounter (Signed)
Spoke with patient regarding medication, explained to her that I would call Dr. Bubba Camp to get a okay for the [facility] script.

## 2013-10-27 ENCOUNTER — Other Ambulatory Visit: Payer: Self-pay | Admitting: *Deleted

## 2013-10-27 ENCOUNTER — Ambulatory Visit (INDEPENDENT_AMBULATORY_CARE_PROVIDER_SITE_OTHER): Payer: 59 | Admitting: Internal Medicine

## 2013-10-27 VITALS — BP 144/80 | HR 84 | Temp 98.2°F | Wt 180.4 lb

## 2013-10-27 DIAGNOSIS — E119 Type 2 diabetes mellitus without complications: Secondary | ICD-10-CM

## 2013-10-27 DIAGNOSIS — K5900 Constipation, unspecified: Secondary | ICD-10-CM

## 2013-10-27 DIAGNOSIS — D649 Anemia, unspecified: Secondary | ICD-10-CM

## 2013-10-27 DIAGNOSIS — G40909 Epilepsy, unspecified, not intractable, without status epilepticus: Secondary | ICD-10-CM

## 2013-10-27 DIAGNOSIS — I10 Essential (primary) hypertension: Secondary | ICD-10-CM

## 2013-10-27 MED ORDER — METFORMIN HCL 500 MG PO TABS: ORAL_TABLET | ORAL | Status: DC

## 2013-10-27 MED ORDER — LEVETIRACETAM 500 MG PO TABS: 500.0000 mg | ORAL_TABLET | Freq: Two times a day (BID) | ORAL | Status: DC

## 2013-10-27 NOTE — Progress Notes (Signed)
Patient ID: Sarah Brock, female   DOB: 30-Nov-1955, 58 y.o.   MRN: [id number]     Chief Complaint  Patient presents with  . Follow-up    f/u and medication refills   Allergies  Allergen Reactions  . Shrimp [Shellfish Allergy]    HPI 58 y/o female patient is here for follow up. She has hx of seizure, HTN, DM. She denies any seizure activities at present. Has not been checking her cbg. She has been feeling good overall. bp has been stable.  Review of Systems  Constitutional: Negative for fever, chills, weight loss, malaise/fatigue and diaphoresis.  HENT: Negative for congestion, hearing loss and sore throat.   Eyes: Negative for blurred vision, double vision and discharge.  Respiratory: Negative for cough, sputum production, shortness of breath and wheezing.   Cardiovascular: Negative for chest pain, palpitations, orthopnea and leg swelling.  Gastrointestinal: Negative for heartburn, nausea, vomiting, abdominal pain, diarrhea and constipation.  Genitourinary: Negative for dysuria, urgency, frequency and flank pain.  Musculoskeletal: Negative for back pain, falls, joint pain and myalgias.  Skin: Negative for itching and rash.  Neurological: Negative for dizziness, tingling, focal weakness and headaches.  Psychiatric/Behavioral: Negative for depression and memory loss. The patient is not nervous/anxious.    Past Medical History  Diagnosis Date  . Diabetes mellitus   . Seizure disorder   . Other specified cardiac dysrhythmias(427.89)   . Osteoarthrosis, unspecified whether generalized or localized, unspecified site   . Unspecified vitamin D deficiency   . Other and unspecified hyperlipidemia   . Anemia, unspecified   . Unspecified essential hypertension   . Other convulsions   . Other malaise and fatigue    Past Surgical History  Procedure Laterality Date  . Abdominal hysterectomy    . Breast lumpectomy Right 1997   Medication reviewed. See [facility]  Physical exam BP 144/80   Pulse 84  Temp(Src) 98.2 F (36.8 C) (Oral)  Wt 180 lb 6.4 oz (81.829 kg)  SpO2 98%  Constitutional: She is oriented to person, place, and time. She appears well-developed and well-nourished. No distress.   HENT:   Head: Normocephalic and atraumatic.   Mouth/Throat: Oropharynx is clear and moist. No oropharyngeal exudate.   Eyes: Conjunctivae and EOM are normal. Pupils are equal, round, and reactive to light.   Neck: Normal range of motion. Neck supple. No JVD present. No thyromegaly present.   Cardiovascular: Normal rate, regular rhythm, normal heart sounds and intact distal pulses.    No murmur heard. Pulmonary/Chest: Effort normal and breath sounds normal. No respiratory distress. She has no wheezes. She has no rales. She exhibits no tenderness.   Abdominal: Soft. Bowel sounds are normal. She exhibits no distension and no mass.  Musculoskeletal: Normal range of motion. She exhibits no edema and no tenderness.  Lymphadenopathy:    She has no cervical adenopathy.  Neurological: She is alert and oriented to person, place, and time. No cranial nerve deficit.   Skin: Skin is warm and dry. She is not diaphoretic. No erythema.  Psychiatric: She has a normal mood and affect. Her behavior is normal.   Labs- Lab Results  Component Value Date   HGBA1C 7.0* 04/15/2013   CMP     Component Value Date/Time   NA 144 01/13/2013 1637   NA 141 05/07/2011 1225   K 3.7 01/13/2013 1637   CL 104 01/13/2013 1637   CO2 27 01/13/2013 1637   GLUCOSE 93 01/13/2013 1637   GLUCOSE 171* 05/07/2011 1225   BUN  11 01/13/2013 1637   BUN 9 05/07/2011 1225   CREATININE 0.89 01/13/2013 1637   CALCIUM 9.6 01/13/2013 1637   PROT 7.1 01/13/2013 1637   PROT 7.2 05/23/2010 1603   ALBUMIN 3.5 05/23/2010 1603   AST 26 01/13/2013 1637   ALT 22 01/13/2013 1637   ALKPHOS 64 01/13/2013 1637   BILITOT 0.2 01/13/2013 1637   GFRNONAA 72 01/13/2013 1637   GFRAA 83 01/13/2013 1637   Assessment/plan  1. Seizure  disorder Continue keppra 500 mg bid, check keppra level. Patient has remained seizure free for more than 2 years, if level is therapeutic, consider lower the dose - CBC with Differential - CMP - Vitamin D, 1,25-dihydroxy - Hemoglobin A1c - Levetiracetam level  2. Anemia, unspecified Stable. Check cbc. Also has had low vit d level in past, check vit d level - Vitamin D, 1,25-dihydroxy  3. Diabetes mellitus type 2, controlled, without complications Continue metformin with lisinopril and ASA. Check a1c and urine microalbumin - Hemoglobin A1c - Microalbumin/Creatinine Ratio, Urine  4. Unspecified constipation Stable. Off all medications  5. Unspecified essential hypertension Continue lisinopril and assess renal function. ekg next visit

## 2013-10-27 NOTE — Telephone Encounter (Signed)
[facility]

## 2013-10-29 LAB — CBC WITH DIFFERENTIAL/PLATELET
BASOS ABS: 0.1 10*3/uL (ref 0.0–0.2)
Basos: 1 %
EOS ABS: 0.5 10*3/uL — AB (ref 0.0–0.4)
Eos: 8 %
HCT: 32.5 % — ABNORMAL LOW (ref 34.0–46.6)
Hemoglobin: 10.9 g/dL — ABNORMAL LOW (ref 11.1–15.9)
Immature Grans (Abs): 0 10*3/uL (ref 0.0–0.1)
Immature Granulocytes: 0 %
LYMPHS ABS: 2.4 10*3/uL (ref 0.7–3.1)
LYMPHS: 36 %
MCH: 25.1 pg — AB (ref 26.6–33.0)
MCHC: 33.5 g/dL (ref 31.5–35.7)
MCV: 75 fL — ABNORMAL LOW (ref 79–97)
Monocytes Absolute: 0.4 10*3/uL (ref 0.1–0.9)
Monocytes: 6 %
Neutrophils Absolute: 3.4 10*3/uL (ref 1.4–7.0)
Neutrophils Relative %: 49 %
RBC: 4.35 x10E6/uL (ref 3.77–5.28)
RDW: 14.9 % (ref 12.3–15.4)
WBC: 6.7 10*3/uL (ref 3.4–10.8)

## 2013-10-29 LAB — COMPREHENSIVE METABOLIC PANEL
ALBUMIN: 4.1 g/dL (ref 3.5–5.5)
ALK PHOS: 66 IU/L (ref 39–117)
ALT: 17 IU/L (ref 0–32)
AST: 22 IU/L (ref 0–40)
Albumin/Globulin Ratio: 1.5 (ref 1.1–2.5)
BUN / CREAT RATIO: 16 (ref 9–23)
BUN: 13 mg/dL (ref 6–24)
CO2: 25 mmol/L (ref 18–29)
CREATININE: 0.81 mg/dL (ref 0.57–1.00)
Calcium: 9.3 mg/dL (ref 8.7–10.2)
Chloride: 102 mmol/L (ref 97–108)
GFR calc Af Amer: 93 mL/min/{1.73_m2} (ref >59)
GFR calc non Af Amer: 80 mL/min/{1.73_m2} (ref >59)
GLOBULIN, TOTAL: 2.7 g/dL (ref 1.5–4.5)
Glucose: 99 mg/dL (ref 65–99)
Potassium: 3.4 mmol/L — ABNORMAL LOW (ref 3.5–5.2)
Sodium: 141 mmol/L (ref 134–144)
Total Bilirubin: 0.2 mg/dL (ref 0.0–1.2)
Total Protein: 6.8 g/dL (ref 6.0–8.5)

## 2013-10-29 LAB — MICROALBUMIN / CREATININE URINE RATIO
CREATININE UR: 213 mg/dL (ref 15.0–278.0)
MICROALB/CREAT RATIO: 11.2 mg/g creat (ref 0.0–30.0)
MICROALBUM., U, RANDOM: 23.9 ug/mL — AB (ref 0.0–17.0)

## 2013-10-29 LAB — LEVETIRACETAM LEVEL: LEVETIRACETAM: 17 ug/mL (ref 5.0–63.0)

## 2013-10-29 LAB — VITAMIN D 1,25 DIHYDROXY: Vit D, 1,25-Dihydroxy: 48.6 pg/mL (ref 19.9–79.3)

## 2013-10-29 LAB — HEMOGLOBIN A1C
Est. average glucose Bld gHb Est-mCnc: 146 mg/dL
HEMOGLOBIN A1C: 6.7 % — AB (ref 4.8–5.6)

## 2013-11-06 ENCOUNTER — Telehealth: Payer: Self-pay | Admitting: *Deleted

## 2013-11-06 NOTE — Telephone Encounter (Signed)
Patient called this afternoon (3:45) and stated that she had severe chest pains this morning, but went on to work at [employer]. Stated that she was not having them bad now, but wanted to know if she could be seen. Told patient that our office closed at 12 today and that I would advise her to go to the urgent care to be evaluated. She agreed.

## 2013-11-11 ENCOUNTER — Ambulatory Visit: Payer: 59 | Admitting: Internal Medicine

## 2013-11-26 ENCOUNTER — Other Ambulatory Visit: Payer: Self-pay | Admitting: Internal Medicine

## 2013-11-26 NOTE — Telephone Encounter (Signed)
Patient Requested Refill

## 2014-01-04 ENCOUNTER — Other Ambulatory Visit: Payer: Self-pay

## 2014-01-05 ENCOUNTER — Other Ambulatory Visit: Payer: 59

## 2014-01-05 DIAGNOSIS — E876 Hypokalemia: Secondary | ICD-10-CM

## 2014-01-06 LAB — BASIC METABOLIC PANEL
BUN / CREAT RATIO: 16 (ref 9–23)
BUN: 14 mg/dL (ref 6–24)
CHLORIDE: 99 mmol/L (ref 97–108)
CO2: 24 mmol/L (ref 18–29)
Calcium: 9.5 mg/dL (ref 8.7–10.2)
Creatinine, Ser: 0.86 mg/dL (ref 0.57–1.00)
GFR calc Af Amer: 86 mL/min/{1.73_m2} (ref >59)
GFR calc non Af Amer: 75 mL/min/{1.73_m2} (ref >59)
Glucose: 207 mg/dL — ABNORMAL HIGH (ref 65–99)
POTASSIUM: 3.5 mmol/L (ref 3.5–5.2)
SODIUM: 140 mmol/L (ref 134–144)

## 2014-02-24 ENCOUNTER — Ambulatory Visit (INDEPENDENT_AMBULATORY_CARE_PROVIDER_SITE_OTHER): Payer: 59 | Admitting: Internal Medicine

## 2014-02-24 ENCOUNTER — Encounter: Payer: Self-pay | Admitting: Internal Medicine

## 2014-02-24 VITALS — BP 142/88 | HR 95 | Temp 97.9°F | Resp 20 | Ht 62.5 in | Wt 179.0 lb

## 2014-02-24 DIAGNOSIS — E118 Type 2 diabetes mellitus with unspecified complications: Secondary | ICD-10-CM

## 2014-02-24 DIAGNOSIS — R5381 Other malaise: Secondary | ICD-10-CM

## 2014-02-24 DIAGNOSIS — R5383 Other fatigue: Secondary | ICD-10-CM

## 2014-02-24 DIAGNOSIS — L659 Nonscarring hair loss, unspecified: Secondary | ICD-10-CM

## 2014-02-24 DIAGNOSIS — E1165 Type 2 diabetes mellitus with hyperglycemia: Secondary | ICD-10-CM

## 2014-02-24 DIAGNOSIS — IMO0002 Reserved for concepts with insufficient information to code with codable children: Secondary | ICD-10-CM

## 2014-02-24 MED ORDER — GLUCOSE BLOOD VI STRP: ORAL_STRIP | Status: DC

## 2014-02-24 NOTE — Progress Notes (Signed)
Patient ID: Sarah Brock, female   DOB: 1956/04/11, 58 y.o.   MRN: [id number]   Chief Complaint  Patient presents with  . Acute Visit    hair loss   Allergies  Allergen Reactions  . Shrimp [Shellfish Allergy]    HPI 58 y/o female pt is here with concerns for hair loss. She has been losing a lot of hair everytime she brushes her hair. She has been using a new hair relaxer kit for few months. Earlier she used to go to a Probation officer but has not been able to do this for past 3-4 months and has now been noticing more hair loss She also feels low in term of her energy. occassional constipation now.  Taking her seizure meds  ROS No fever or chills Denies dyspnea denies chest pain or palpitations bp reading stable  Past Medical History  Diagnosis Date  . Diabetes mellitus   . Seizure disorder   . Other specified cardiac dysrhythmias(427.89)   . Osteoarthrosis, unspecified whether generalized or localized, unspecified site   . Unspecified vitamin D deficiency   . Other and unspecified hyperlipidemia   . Anemia, unspecified   . Unspecified essential hypertension   . Other convulsions   . Other malaise and fatigue    Current Outpatient Prescriptions on File Prior to Visit  Medication Sig Dispense Refill  . aspirin 81 MG tablet Take 81 mg by mouth daily.      . hydrochlorothiazide (HYDRODIURIL) 25 MG tablet Take one tablet by mouth twice daily to control blood pressure  60 tablet  5  . levETIRAcetam (KEPPRA) 500 MG tablet Take 1 tablet (500 mg total) by mouth every 12 (twelve) hours.  60 tablet  2  . lisinopril (PRINIVIL,ZESTRIL) 20 MG tablet TAKE 1 TABLET BY MOUTH ONCE DAILY FOR BLOOD PRESSURE  30 tablet  4  . metFORMIN (GLUCOPHAGE) 500 MG tablet TAKE 1 TABLET BY MOUTH TWICE DAILY WITH MEALS  60 tablet  4   No current facility-administered medications on file prior to visit.   Physical exam BP 142/88  Pulse 95  Temp(Src) 97.9 F (36.6 C) (Oral)  Resp 20  Ht 5' 2.5" (1.588 m)   Wt 179 lb (81.194 kg)  BMI 32.20 kg/m2  SpO2 96%  General- elderly female in no acute distress Head- atraumatic, normocephalic, no patches of hair loss, no signs of skin infection on scalp Neck- no lymphadenopathy, no thyromegaly Cardiovascular- normal s1,s2, no murmurs Respiratory- bilateral clear to auscultation Musculoskeletal- able to move all 4 extremities Neurological- no focal deficit Skin- warm and dry Psychiatry- alert and oriented to person, place and time, normal mood and affect  Lab Results  Component Value Date   TSH 1.526 05/25/2010   CBC Latest Ref Rng 10/27/2013 01/13/2013 05/07/2011  WBC 3.4 - 10.8 x10E3/uL 6.7 6.6 -  Hemoglobin 11.1 - 15.9 g/dL 10.9(L) 10.9(L) 13.3  Hematocrit 34.0 - 46.6 % 32.5(L) 33.9(L) 39.0  Platelets 150 - 379 x10E3/uL - 271 -   Lab Results  Component Value Date   HGBA1C 6.7* 10/27/2013    Assessment/plan  1. Type II or unspecified type diabetes mellitus with unspecified complication, uncontrolled - glucose blood test strip; Use as instructed  Dispense: 100 each; Refill: 12 - Hemoglobin A1c - continue metformin  2. Hair loss Rule out thyroid problem and anemia. Advised to avoid chemical treatment to her hair for now and consider seeing hair stylist for hair friendly products - TSH - CBC with Differential  3. Other malaise  and fatigue Rule out hypothyroidism. Has hx of anemia in past and been on iron supplement in past. Recheck cbc - TSH - CBC with Differential

## 2014-02-25 LAB — CBC WITH DIFFERENTIAL/PLATELET
BASOS: 1 %
Basophils Absolute: 0 10*3/uL (ref 0.0–0.2)
Eos: 6 %
Eosinophils Absolute: 0.4 10*3/uL (ref 0.0–0.4)
HCT: 34 % (ref 34.0–46.6)
Hemoglobin: 11.1 g/dL (ref 11.1–15.9)
Immature Grans (Abs): 0 10*3/uL (ref 0.0–0.1)
Immature Granulocytes: 0 %
LYMPHS ABS: 1.9 10*3/uL (ref 0.7–3.1)
Lymphs: 28 %
MCH: 24.9 pg — AB (ref 26.6–33.0)
MCHC: 32.6 g/dL (ref 31.5–35.7)
MCV: 76 fL — AB (ref 79–97)
Monocytes Absolute: 0.5 10*3/uL (ref 0.1–0.9)
Monocytes: 8 %
NEUTROS ABS: 3.8 10*3/uL (ref 1.4–7.0)
NEUTROS PCT: 57 %
RBC: 4.46 x10E6/uL (ref 3.77–5.28)
RDW: 14.5 % (ref 12.3–15.4)
WBC: 6.7 10*3/uL (ref 3.4–10.8)

## 2014-02-25 LAB — HEMOGLOBIN A1C
ESTIMATED AVERAGE GLUCOSE: 157 mg/dL
Hgb A1c MFr Bld: 7.1 % — ABNORMAL HIGH (ref 4.8–5.6)

## 2014-02-25 LAB — TSH: TSH: 1.15 u[IU]/mL (ref 0.450–4.500)

## 2014-02-26 ENCOUNTER — Telehealth: Payer: Self-pay | Admitting: *Deleted

## 2014-02-26 ENCOUNTER — Other Ambulatory Visit: Payer: Self-pay | Admitting: *Deleted

## 2014-02-26 MED ORDER — FERROUS SULFATE 325 (65 FE) MG PO TABS: 325.0000 mg | ORAL_TABLET | Freq: Two times a day (BID) | ORAL | Status: AC

## 2014-02-26 NOTE — Telephone Encounter (Signed)
Message copied by Eilene Ghazi on Fri Feb 26, 2014  1:11 PM ------      Message from: [place], [place]: Thu Feb 25, 2014 11:13 PM       Your blood work has normal thyroid level. You have anemia. i would like to start you on ferrous sulfate 325 mg twice a day for now. Your sugar has worsened from before. Continue metofmrin for now and check your blood sugar atleast twice a week and bring reading sfor review on next OV ------

## 2014-02-26 NOTE — Telephone Encounter (Signed)
Called left message for patient to call the office for lab results and prescription has been sent to the pharmacy for pick up.

## 2014-03-02 NOTE — Telephone Encounter (Signed)
Patient called back and addressed labs. Agreed.

## 2014-04-05 ENCOUNTER — Other Ambulatory Visit: Payer: Self-pay | Admitting: Internal Medicine

## 2014-04-05 NOTE — Telephone Encounter (Signed)
Patient aware rx sent to pharmacy.  

## 2014-04-07 ENCOUNTER — Other Ambulatory Visit: Payer: Self-pay | Admitting: Internal Medicine

## 2014-05-06 ENCOUNTER — Other Ambulatory Visit: Payer: Self-pay | Admitting: Nurse Practitioner

## 2014-05-11 ENCOUNTER — Encounter: Payer: 59 | Admitting: Internal Medicine

## 2014-05-19 ENCOUNTER — Ambulatory Visit (INDEPENDENT_AMBULATORY_CARE_PROVIDER_SITE_OTHER): Payer: 59 | Admitting: Internal Medicine

## 2014-05-19 ENCOUNTER — Encounter: Payer: Self-pay | Admitting: Internal Medicine

## 2014-05-19 VITALS — BP 150/100 | HR 94 | Temp 98.3°F | Resp 20 | Ht 62.5 in | Wt 176.4 lb

## 2014-05-19 DIAGNOSIS — Z0000 Encounter for general adult medical examination without abnormal findings: Secondary | ICD-10-CM

## 2014-05-19 DIAGNOSIS — G40909 Epilepsy, unspecified, not intractable, without status epilepticus: Secondary | ICD-10-CM

## 2014-05-19 DIAGNOSIS — Z23 Encounter for immunization: Secondary | ICD-10-CM

## 2014-05-19 DIAGNOSIS — E119 Type 2 diabetes mellitus without complications: Secondary | ICD-10-CM

## 2014-05-19 DIAGNOSIS — I10 Essential (primary) hypertension: Secondary | ICD-10-CM

## 2014-05-19 DIAGNOSIS — D509 Iron deficiency anemia, unspecified: Secondary | ICD-10-CM | POA: Insufficient documentation

## 2014-05-19 NOTE — Progress Notes (Signed)
Patient ID: Sarah Brock, female   DOB: 1956-04-29, 58 y.o.   MRN: [id number]    Chief Complaint  Patient presents with  . Annual Exam   Allergies  Allergen Reactions  . Shrimp [Shellfish Allergy]    HPI 58 y/o female patient is here for annual exam. She has hx of HTN, DM type 2, seizures, iron def anemia. She denies any concerns today. She has been taking her iron supplement. Her energy level is improved. Her joint aches has also improved. Complaint with her meds. Her bp is elevated today, pt mentions bp at home < 140/90. She also mentions rushing today, has been busy cleaning house, doing shopping as her kids are coming over and feels excited overall. Denies chest pain, sob, abdominal pain or headache. Repeat bp check unchanged  Health maintenance Colonoscopy- never done mammogram- not done in several years Influenza vaccine in sept 2015 dexa scan not done  Review of Systems  Constitutional: Negative for fever, chills, weight loss, malaise/fatigue and diaphoresis.  HENT: Negative for congestion, hearing loss and sore throat.   Eyes: Negative for blurred vision, double vision and discharge. Has not seen an eye doctor in more than a year  Respiratory: Negative for cough, sputum production, shortness of breath and wheezing.   Cardiovascular: Negative for chest pain, palpitations, orthopnea and leg swelling.  Gastrointestinal: Negative for heartburn, nausea, vomiting, abdominal pain, diarrhea and constipation.  Genitourinary: Negative for dysuria, urgency, frequency and flank pain.  Musculoskeletal: Negative for back pain, falls, joint pain and myalgias.  Skin: Negative for itching and rash.  Neurological: Negative for dizziness, tingling, focal weakness and headaches.  Psychiatric/Behavioral: Negative for depression and memory loss. The patient is not nervous/anxious.    Past Medical History  Diagnosis Date  . Diabetes mellitus   . Seizure disorder   . Other specified cardiac  dysrhythmias(427.89)   . Osteoarthrosis, unspecified whether generalized or localized, unspecified site   . Unspecified vitamin D deficiency   . Other and unspecified hyperlipidemia   . Anemia, unspecified   . Unspecified essential hypertension   . Other convulsions   . Other malaise and fatigue    Past Surgical History  Procedure Laterality Date  . Abdominal hysterectomy    . Breast lumpectomy Right 1997   Current Outpatient Prescriptions on File Prior to Visit  Medication Sig Dispense Refill  . aspirin 81 MG tablet Take 81 mg by mouth daily.    . ferrous sulfate 325 (65 FE) MG tablet Take 1 tablet (325 mg total) by mouth 2 (two) times daily with a meal. 60 tablet 2  . glucose blood test strip Use as instructed 100 each 12  . hydrochlorothiazide (HYDRODIURIL) 25 MG tablet TAKE 1 TABLET BY MOUTH TWICE DAILY TO CONTROL BLOOD PRESSURE 60 tablet 5  . levETIRAcetam (KEPPRA) 500 MG tablet TAKE 1 TABLET BY MOUTH EVERY 12 HOURS 60 tablet 5  . lisinopril (PRINIVIL,ZESTRIL) 20 MG tablet TAKE 1 TABLET BY MOUTH ONCE DAILY FOR BLOOD PRESSURE 30 tablet 4  . metFORMIN (GLUCOPHAGE) 500 MG tablet TAKE 1 TABLET BY MOUTH TWICE DAILY WITH MEALS 60 tablet 5   No current facility-administered medications on file prior to visit.   History   Social History  . Marital Status: Widowed    Spouse Name: N/A    Number of Children: N/A  . Years of Education: N/A   Occupational History  . Not on file.   Social History Main Topics  . Smoking status: Never Smoker   .  Smokeless tobacco: Not on file  . Alcohol Use: No  . Drug Use: No  . Sexual Activity: Not on file   Other Topics Concern  . Not on file   Social History Narrative   Family History  Problem Relation Age of Onset  . Cancer Father     Esophageal   Physical exam BP 150/100 mmHg  Pulse 114  Temp(Src) 98.3 F (36.8 C) (Oral)  Resp 20  Ht 5' 2.5" (1.588 m)  Wt 176 lb 6.4 oz (80.015 kg)  BMI 31.73 kg/m2  SpO2 97%  General- adult  female in no acute distress Head- atraumatic, normocephalic Eyes- PERRLA, EOMI, no pallor, no icterus, no discharge Ears- left ear normal tympanic membrane and normal external ear canal , right ear normal tympanic membrane and normal external ear canal Neck- no lymphadenopathy, no thyromegaly, no jugular vein distension, no carotid bruit Nose- normal nasal mucosa, no maxillary sinus tenderness, no frontal sinus tenderness Mouth- normal mucus membrane, no oral thrush, normal oropharynx, sore on her hard palate Chest- no chest wall deformities, no chest wall tenderness Breast- no masses, no palpable lumps, normal nipple and areola exam, no axillary lymphadenopathy Cardiovascular- normal s1,s2, no murmurs/ rubs/ gallops, normal distal pulses Respiratory- bilateral clear to auscultation, no wheeze, no rhonchi, no crackles Abdomen- bowel sounds present, soft, non tender, no organomegaly, no abdominal bruits, no guarding or rigidity, no CVA tenderness Pelvic exam- refused Musculoskeletal- able to move all 4 extremities, no spinal and paraspinal tenderness, steady gait, no use of assistive device, normal range of motion, no leg edema Neurological- no focal deficit, normal reflexes, normal muscle strength, normal sensation to fine touch and vibration Skin- warm and dry skin Psychiatry- alert and oriented to person, place and time, normal mood and affect  Labs- CBC    Component Value Date/Time   WBC 6.7 02/24/2014 1001   WBC 8.6 05/24/2010 0615   RBC 4.46 02/24/2014 1001   RBC 4.27 05/24/2010 0615   HGB 11.1 02/24/2014 1001   HCT 34.0 02/24/2014 1001   PLT 271 01/13/2013 1637   MCV 76* 02/24/2014 1001   MCH 24.9* 02/24/2014 1001   MCH 25.5* 05/24/2010 0615   MCHC 32.6 02/24/2014 1001   MCHC 32.3 05/24/2010 0615   RDW 14.5 02/24/2014 1001   RDW 13.9 05/24/2010 0615   LYMPHSABS 1.9 02/24/2014 1001   EOSABS 0.4 02/24/2014 1001   BASOSABS 0.0 02/24/2014 1001    CMP     Component Value  Date/Time   NA 140 01/05/2014 0822   NA 141 05/07/2011 1225   K 3.5 01/05/2014 0822   CL 99 01/05/2014 0822   CO2 24 01/05/2014 0822   GLUCOSE 207* 01/05/2014 0822   GLUCOSE 171* 05/07/2011 1225   BUN 14 01/05/2014 0822   BUN 9 05/07/2011 1225   CREATININE 0.86 01/05/2014 0822   CALCIUM 9.5 01/05/2014 0822   PROT 6.8 10/27/2013 1641   PROT 7.2 05/23/2010 1603   ALBUMIN 3.5 05/23/2010 1603   AST 22 10/27/2013 1641   ALT 17 10/27/2013 1641   ALKPHOS 66 10/27/2013 1641   BILITOT 0.2 10/27/2013 1641   GFRNONAA 75 01/05/2014 0822   GFRAA 86 01/05/2014 0822   Lab Results  Component Value Date   HGBA1C 7.1* 02/24/2014   Lipid Panel     Component Value Date/Time   CHOL  05/25/2010 0453    171        ATP III CLASSIFICATION:  <200     mg/dL  Desirable  200-239  mg/dL   Borderline High  >=240    mg/dL   High          TRIG 132 05/25/2010 0453   HDL 43 05/25/2010 0453   CHOLHDL 4.0 05/25/2010 0453   VLDL 26 05/25/2010 0453   LDLCALC * 05/25/2010 0453    102        Total Cholesterol/HDL:CHD Risk Coronary Heart Disease Risk Table                     Men   Women  1/2 Average Risk   3.4   3.3  Average Risk       5.0   4.4  2 X Average Risk   9.6   7.1  3 X Average Risk  23.4   11.0        Use the calculated Patient Ratio above and the CHD Risk Table to determine the patient's CHD Risk.        ATP III CLASSIFICATION (LDL):  <100     mg/dL   Optimal  100-129  mg/dL   Near or Above                    Optimal  130-159  mg/dL   Borderline  160-189  mg/dL   High  >190     mg/dL   Very High   Lab Results  Component Value Date   TSH 1.150 02/24/2014   Assessment/plan  1. Diabetes mellitus type 2, controlled, without complications E3P reviewed. Continue metformin 500 mg bid for now with aspirin. Has microalbuminuira. Continue lisinopril 20 mg daily. Not on statin, check lipid . Give prevnar today. uptodate on flu vaccine. Advised to avoid skipping meals as she did this  today and yesterday. Pt agrees  2. Seizure disorder Stable, remains seizure free. Continue keppra current regimen  3. Anemia, iron deficiency Continue ferrous sulfate for now, regular bowel movement, monitor clinically  4. Essential hypertension Elevated bp in office. Denies associated symptom. Does not want med adjsutment. Her anxiousness could be contributing to this. Advised on bp check and cutting down on salt intake at home.  Continue hctz and lisinopril.  5. Annual physical exam the patient was counseled regarding the appropriate use of alcohol, regular self-examination of the breasts on a monthly basis, prevention of dental and periodontal disease, diet, regular sustained exercise for at least 30 minutes 5 times per week, routine screening interval for mammogram as recommended by the [facility] and ACOG, the proper use of sunscreen and protective clothing, tobacco use, and recommended schedule for GI hemoccult testing, colonoscopy, cholesterol, thyroid and diabetes screening. Refused colonoscopy. FOBT card provided. Mammogram and dexa to be scheduled. Pt mentions having pap smear in 2013- will check in old system. uptodate with influenza vaccine. Give prevnar today

## 2014-06-04 ENCOUNTER — Encounter: Payer: Self-pay | Admitting: Internal Medicine

## 2014-10-07 ENCOUNTER — Other Ambulatory Visit: Payer: Self-pay | Admitting: Internal Medicine

## 2014-12-10 ENCOUNTER — Other Ambulatory Visit: Payer: Self-pay | Admitting: Internal Medicine

## 2014-12-10 NOTE — Telephone Encounter (Signed)
Patient called and requested to be sent to pharmacy. Stated that she was out of her seizure medication.

## 2015-01-26 ENCOUNTER — Ambulatory Visit (INDEPENDENT_AMBULATORY_CARE_PROVIDER_SITE_OTHER): Payer: 59 | Admitting: Internal Medicine

## 2015-01-26 ENCOUNTER — Encounter: Payer: Self-pay | Admitting: Internal Medicine

## 2015-01-26 VITALS — BP 150/90 | HR 77 | Temp 97.9°F | Resp 18 | Ht 63.0 in | Wt 173.8 lb

## 2015-01-26 DIAGNOSIS — D509 Iron deficiency anemia, unspecified: Secondary | ICD-10-CM

## 2015-01-26 DIAGNOSIS — I10 Essential (primary) hypertension: Secondary | ICD-10-CM

## 2015-01-26 DIAGNOSIS — E119 Type 2 diabetes mellitus without complications: Secondary | ICD-10-CM | POA: Diagnosis not present

## 2015-01-26 DIAGNOSIS — G40909 Epilepsy, unspecified, not intractable, without status epilepticus: Secondary | ICD-10-CM | POA: Diagnosis not present

## 2015-01-26 NOTE — Progress Notes (Signed)
Patient ID: Sarah Brock, female   DOB: [birth year], 59 y.o.   MRN: [id number]    Location:    [facility]   Place of Service:   OFFICE  Chief Complaint  Patient presents with  . Medical Management of Chronic Issues    follw-up for medications    HPI:  59 yo female seen today for f/u. She reports feeling well overall. Maintains a healthy diet and is exercising. She take iron supplement as Rx and does not feel as tired. Hair feels brittle. She drinks about 1.5 bottles of water daily. BS are not checked at home. She takes metformin and reports no diarrhea or low BS reactions. No diaphoresis or increased jitteriness. No seizures x few years. She takes keppra BID as Rx  Checks her BP occasionally at work and usually SBP 132/81. 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 Diabetes mellitus   . Seizure disorder   . Other specified cardiac dysrhythmias(427.89)   . Osteoarthrosis, unspecified whether generalized or localized, unspecified site   . Unspecified vitamin D deficiency   . Other and unspecified hyperlipidemia   . Anemia, unspecified   . Unspecified essential hypertension   . Other convulsions   . Other malaise and fatigue     Past Surgical History  Procedure Laterality Date  . Abdominal hysterectomy    . Breast lumpectomy Right 1997    Patient Care Team: Gildardo Cranker, DO as PCP - General (Internal Medicine)  History   Social History  . Marital Status: Widowed    Spouse Name: N/A  . Number of Children: N/A  . Years of Education: N/A   Occupational History  . Not on file.   Social History Main Topics  . Smoking status: Never Smoker   . Smokeless tobacco: Never Used  . Alcohol Use: No  . Drug Use: No  . Sexual Activity: Not on file   Other Topics Concern  . Not on file   Social History Narrative     reports that she has never smoked. She has never used smokeless tobacco. She reports that she does not drink alcohol  or use illicit drugs.  Allergies  Allergen Reactions  . Shrimp [Shellfish Allergy]     Medications: Patient's Medications  New Prescriptions   No medications on file  Previous Medications   ASPIRIN 81 MG TABLET    Take 81 mg by mouth daily.   FERROUS SULFATE 325 (65 FE) MG TABLET    Take 1 tablet (325 mg total) by mouth 2 (two) times daily with a meal.   GLUCOSE BLOOD TEST STRIP    Use as instructed   HYDROCHLOROTHIAZIDE (HYDRODIURIL) 25 MG TABLET    TAKE 1 TABLET BY MOUTH TWICE DAILY TO CONTROL BLOOD PRESSURE   LEVETIRACETAM (KEPPRA) 500 MG TABLET    Take one tablet by mouth every 12 hours for seizures   LISINOPRIL (PRINIVIL,ZESTRIL) 20 MG TABLET    Take one tablet by mouth once daily for blood pressure   METFORMIN (GLUCOPHAGE) 500 MG TABLET    Take one tablet by mouth twice daily with meals to control blood sugar  Modified Medications   No medications on file  Discontinued Medications   No medications on file    Review of Systems  Constitutional: Negative for fever, chills, diaphoresis, activity change, appetite change and fatigue.  HENT: Negative for ear pain and sore throat.   Eyes: Negative for visual disturbance.  Respiratory: Negative for cough, chest tightness and shortness of breath.   Cardiovascular: Negative for chest pain, palpitations and leg swelling.  Gastrointestinal: Negative for nausea, vomiting, abdominal pain, diarrhea, constipation and blood in stool.  Genitourinary: Negative for dysuria.  Musculoskeletal: Negative for arthralgias.  Neurological: Negative for dizziness, tremors, seizures, numbness and headaches.  Psychiatric/Behavioral: Negative for sleep disturbance. The patient is not nervous/anxious.     Filed Vitals:   01/26/15 1646  BP: 150/90  Pulse: 77  Temp: 97.9 F (36.6 C)  TempSrc: Oral  Resp: 18  Height: 5\' 3"  (1.6 m)  Weight: 173 lb 12.8 oz (78.835 kg)  SpO2: 98%   Body mass index is 30.79 kg/(m^2).  Physical Exam  Constitutional:  She is oriented to person, place, and time. She appears well-developed and well-nourished.  HENT:  Mouth/Throat: Oropharynx is clear and moist. No oropharyngeal exudate.  Eyes: Pupils are equal, round, and reactive to light. No scleral icterus.  Neck: Neck supple. Carotid bruit is not present. No tracheal deviation present. No thyromegaly present.  Cardiovascular: Normal rate, regular rhythm, normal heart sounds and intact distal pulses.  Exam reveals no gallop and no friction rub.   No murmur heard. No LE edema b/l. no calf TTP.   Pulmonary/Chest: Effort normal and breath sounds normal. No stridor. No respiratory distress. She has no wheezes. She has no rales.  Abdominal: Soft. Bowel sounds are normal. She exhibits no distension and no mass. There is no hepatomegaly. There is no tenderness. There is no rebound and no guarding.  Lymphadenopathy:    She has no cervical adenopathy.  Neurological: She is alert and oriented to person, place, and time. She has normal reflexes.  Skin: Skin is warm and dry. No rash noted.  Psychiatric: She has a normal mood and affect. Her behavior is normal. Judgment and thought content normal.   Diabetic Foot Exam - Simple   Simple Foot Form  Diabetic Foot exam was performed with the following findings:  Yes 01/26/2015  5:15 PM  Visual Inspection  No deformities, no ulcerations, no other skin breakdown bilaterally:  Yes  Sensation Testing  Intact to touch and monofilament testing bilaterally:  Yes  Pulse Check  Posterior Tibialis and Dorsalis pulse intact bilaterally:  Yes  Comments       Labs reviewed: No visits with results within 3 Month(s) from this visit. Latest known visit with results is:  Office Visit on 02/24/2014  Component Date Value Ref Range Status  . TSH 02/24/2014 1.150  0.450 - 4.500 uIU/mL Final  . WBC 02/24/2014 6.7  3.4 - 10.8 x10E3/uL Final  . RBC 02/24/2014 4.46  3.77 - 5.28 x10E6/uL Final  . Hemoglobin 02/24/2014 11.1  11.1 -  15.9 g/dL Final  . HCT 02/24/2014 34.0  34.0 - 46.6 % Final  . MCV 02/24/2014 76* 79 - 97 fL Final  . MCH 02/24/2014 24.9* 26.6 - 33.0 pg Final  . MCHC 02/24/2014 32.6  31.5 - 35.7 g/dL Final  . RDW 02/24/2014 14.5  12.3 - 15.4 % Final  . Neutrophils Relative % 02/24/2014 57   Final  . Lymphs 02/24/2014 28   Final  . Monocytes 02/24/2014 8   Final  . Eos 02/24/2014 6   Final  . Basos 02/24/2014 1   Final  . Neutrophils Absolute 02/24/2014 3.8  1.4 - 7.0 x10E3/uL Final  . Lymphocytes Absolute 02/24/2014 1.9  0.7 - 3.1 x10E3/uL Final  . Monocytes Absolute 02/24/2014 0.5  0.1 - 0.9 x10E3/uL  Final  . Eosinophils Absolute 02/24/2014 0.4  0.0 - 0.4 x10E3/uL Final  . Basophils Absolute 02/24/2014 0.0  0.0 - 0.2 x10E3/uL Final  . Immature Granulocytes 02/24/2014 0   Final  . Immature Grans (Abs) 02/24/2014 0.0  0.0 - 0.1 x10E3/uL Final  . Hgb A1c MFr Bld 02/24/2014 7.1* 4.8 - 5.6 % Final   Comment:          Increased risk for diabetes: 5.7 - 6.4                                   Diabetes: >6.4                                   Glycemic control for adults with diabetes: <7.0  . Est. average glucose Bld gHb Est-m* 02/24/2014 157   Final    No results found.   Assessment/Plan   ICD-9-CM ICD-10-CM   1. Diabetes mellitus type 2, controlled, without complications - stable 426.83 E11.9 CMP     Lipid Panel     Hemoglobin A1c     TSH  2. Essential hypertension - stable 401.9 I10 CMP     TSH  3. Seizure disorder - controlled 345.90 G40.909 CMP  4. Anemia, iron deficiency - stable 280.9 D50.9 CBC with Differential     Ferritin     Iron and TIBC   Continue current medications as ordered  Recommend check BS at home at least weekly  Return for fasting labs in 2-3 weeks  Follow up in 3-4 mos for CPE  [facility] S. Perlie Gold  [facility] [address] [place], [place] [address] [phone] Cell (Monday-Friday 8 AM - 5 PM) [phone]  After 5 PM and follow prompts

## 2015-01-26 NOTE — Patient Instructions (Signed)
Continue current medications as ordered  Return for fasting labs in 2-3 weeks  Follow up in 3-4 mos for CPE

## 2015-02-21 ENCOUNTER — Other Ambulatory Visit: Payer: 59

## 2015-02-21 DIAGNOSIS — I10 Essential (primary) hypertension: Secondary | ICD-10-CM

## 2015-02-21 DIAGNOSIS — E119 Type 2 diabetes mellitus without complications: Secondary | ICD-10-CM

## 2015-02-21 DIAGNOSIS — D509 Iron deficiency anemia, unspecified: Secondary | ICD-10-CM

## 2015-02-21 DIAGNOSIS — G40909 Epilepsy, unspecified, not intractable, without status epilepticus: Secondary | ICD-10-CM

## 2015-02-22 LAB — CBC WITH DIFFERENTIAL/PLATELET
BASOS ABS: 0.1 10*3/uL (ref 0.0–0.2)
Basos: 1 %
EOS (ABSOLUTE): 0.6 10*3/uL — ABNORMAL HIGH (ref 0.0–0.4)
Eos: 9 %
Hematocrit: 35.2 % (ref 34.0–46.6)
Hemoglobin: 11.5 g/dL (ref 11.1–15.9)
IMMATURE GRANS (ABS): 0 10*3/uL (ref 0.0–0.1)
Immature Granulocytes: 0 %
LYMPHS: 29 %
Lymphocytes Absolute: 2 10*3/uL (ref 0.7–3.1)
MCH: 25.6 pg — AB (ref 26.6–33.0)
MCHC: 32.7 g/dL (ref 31.5–35.7)
MCV: 78 fL — ABNORMAL LOW (ref 79–97)
Monocytes Absolute: 0.5 10*3/uL (ref 0.1–0.9)
Monocytes: 7 %
NEUTROS ABS: 3.7 10*3/uL (ref 1.4–7.0)
Neutrophils: 54 %
PLATELETS: 270 10*3/uL (ref 150–379)
RBC: 4.5 x10E6/uL (ref 3.77–5.28)
RDW: 15 % (ref 12.3–15.4)
WBC: 6.8 10*3/uL (ref 3.4–10.8)

## 2015-02-22 LAB — LIPID PANEL
CHOLESTEROL TOTAL: 221 mg/dL — AB (ref 100–199)
Chol/HDL Ratio: 4 ratio units (ref 0.0–4.4)
HDL: 55 mg/dL (ref >39)
LDL Calculated: 146 mg/dL — ABNORMAL HIGH (ref 0–99)
TRIGLYCERIDES: 100 mg/dL (ref 0–149)
VLDL CHOLESTEROL CAL: 20 mg/dL (ref 5–40)

## 2015-02-22 LAB — COMPREHENSIVE METABOLIC PANEL
ALK PHOS: 65 IU/L (ref 39–117)
ALT: 21 IU/L (ref 0–32)
AST: 19 IU/L (ref 0–40)
Albumin/Globulin Ratio: 1.3 (ref 1.1–2.5)
Albumin: 4.1 g/dL (ref 3.5–5.5)
BILIRUBIN TOTAL: 0.2 mg/dL (ref 0.0–1.2)
BUN/Creatinine Ratio: 15 (ref 9–23)
BUN: 14 mg/dL (ref 6–24)
CHLORIDE: 100 mmol/L (ref 97–108)
CO2: 25 mmol/L (ref 18–29)
Calcium: 9.8 mg/dL (ref 8.7–10.2)
Creatinine, Ser: 0.92 mg/dL (ref 0.57–1.00)
GFR calc Af Amer: 79 mL/min/{1.73_m2} (ref >59)
GFR calc non Af Amer: 68 mL/min/{1.73_m2} (ref >59)
Globulin, Total: 3.2 g/dL (ref 1.5–4.5)
Glucose: 161 mg/dL — ABNORMAL HIGH (ref 65–99)
Potassium: 3.5 mmol/L (ref 3.5–5.2)
Sodium: 139 mmol/L (ref 134–144)
Total Protein: 7.3 g/dL (ref 6.0–8.5)

## 2015-02-22 LAB — HEMOGLOBIN A1C
Est. average glucose Bld gHb Est-mCnc: 171 mg/dL
Hgb A1c MFr Bld: 7.6 % — ABNORMAL HIGH (ref 4.8–5.6)

## 2015-02-22 LAB — IRON AND TIBC
IRON SATURATION: 22 % (ref 15–55)
Iron: 63 ug/dL (ref 27–159)
Total Iron Binding Capacity: 286 ug/dL (ref 250–450)
UIBC: 223 ug/dL (ref 131–425)

## 2015-02-22 LAB — TSH: TSH: 1.59 u[IU]/mL (ref 0.450–4.500)

## 2015-02-22 LAB — FERRITIN: Ferritin: 43 ng/mL (ref 15–150)

## 2015-02-23 ENCOUNTER — Other Ambulatory Visit: Payer: Self-pay

## 2015-02-23 MED ORDER — SIMVASTATIN 10 MG PO TABS: ORAL_TABLET | ORAL | Status: DC

## 2015-03-09 ENCOUNTER — Telehealth: Payer: Self-pay | Admitting: *Deleted

## 2015-03-09 NOTE — Telephone Encounter (Signed)
Patient stated that the Simvastatin is causing serious pain in stomach and back end. Stated that she stopped taking it last week and the pain is better. Please Advise.

## 2015-03-15 NOTE — Telephone Encounter (Signed)
Noted. Please add med to allergy list. Needs to maintain tight low fat low cholesterol diet. Please refer to nutritionist.

## 2015-03-16 NOTE — Telephone Encounter (Signed)
LMOM to return call.

## 2015-03-17 ENCOUNTER — Other Ambulatory Visit: Payer: Self-pay | Admitting: Internal Medicine

## 2015-03-25 NOTE — Telephone Encounter (Signed)
LMOM to return call.

## 2015-05-06 ENCOUNTER — Encounter: Payer: Self-pay | Admitting: Internal Medicine

## 2015-05-23 ENCOUNTER — Telehealth: Payer: Self-pay | Admitting: *Deleted

## 2015-05-23 NOTE — Telephone Encounter (Signed)
Patient called and left message stating that she has an appointment on Thursday with Oral Surgeon for a tooth extraction. She stated that she was walking up the stairs this morning and had heart palpitations. No chest pains, No dizziness, No sweating. I tried calling her back and LMOM to return call.

## 2015-06-17 ENCOUNTER — Other Ambulatory Visit: Payer: Self-pay | Admitting: Internal Medicine

## 2015-06-17 ENCOUNTER — Other Ambulatory Visit: Payer: Self-pay | Admitting: *Deleted

## 2015-06-17 MED ORDER — METFORMIN HCL 500 MG PO TABS: ORAL_TABLET | ORAL | Status: DC

## 2015-06-17 MED ORDER — LISINOPRIL 20 MG PO TABS: ORAL_TABLET | ORAL | Status: DC

## 2015-06-17 MED ORDER — LEVETIRACETAM 500 MG PO TABS: ORAL_TABLET | ORAL | Status: DC

## 2015-06-17 NOTE — Telephone Encounter (Signed)
Patient requested and faxed to pharmacy 

## 2015-07-15 MED FILL — LISINOPRIL 20 MG TABLET: 20 | 30 days supply | Qty: 30 | Fill #1

## 2015-07-15 MED FILL — metFORMIN HCL 500 MG TABS: 500 | 30 days supply | Qty: 60 | Fill #1

## 2015-07-15 MED FILL — HYDROCHLOROTHIAZIDE 25 MG T: 25 | 90 days supply | Qty: 180 | Fill #1

## 2015-07-15 MED FILL — levETIRAcetam 500 MG TABS: 500 | 30 days supply | Qty: 60 | Fill #1

## 2015-07-20 ENCOUNTER — Encounter: Payer: Self-pay | Admitting: Internal Medicine

## 2015-07-21 ENCOUNTER — Encounter: Payer: Self-pay | Admitting: Internal Medicine

## 2015-08-10 ENCOUNTER — Telehealth: Payer: Self-pay

## 2015-08-10 NOTE — Telephone Encounter (Signed)
Letter mailed to patient regarding overdue mammogram. Sarah Brock 

## 2015-08-18 MED FILL — levETIRAcetam 500 MG TABS: 500 | 30 days supply | Qty: 60 | Fill #2

## 2015-08-18 MED FILL — metFORMIN HCL 500 MG TABS: 500 | 30 days supply | Qty: 60 | Fill #2

## 2015-08-18 MED FILL — LISINOPRIL 20 MG TABLET: 20 | 30 days supply | Qty: 30 | Fill #2

## 2015-08-24 ENCOUNTER — Ambulatory Visit (INDEPENDENT_AMBULATORY_CARE_PROVIDER_SITE_OTHER): Payer: 59 | Admitting: Internal Medicine

## 2015-08-24 ENCOUNTER — Encounter: Payer: Self-pay | Admitting: Internal Medicine

## 2015-08-24 VITALS — BP 150/100 | HR 92 | Temp 97.8°F | Resp 18 | Ht 63.0 in | Wt 174.2 lb

## 2015-08-24 DIAGNOSIS — Z0000 Encounter for general adult medical examination without abnormal findings: Secondary | ICD-10-CM

## 2015-08-24 DIAGNOSIS — D509 Iron deficiency anemia, unspecified: Secondary | ICD-10-CM | POA: Diagnosis not present

## 2015-08-24 DIAGNOSIS — J301 Allergic rhinitis due to pollen: Secondary | ICD-10-CM

## 2015-08-24 DIAGNOSIS — Z1211 Encounter for screening for malignant neoplasm of colon: Secondary | ICD-10-CM

## 2015-08-24 DIAGNOSIS — G40909 Epilepsy, unspecified, not intractable, without status epilepticus: Secondary | ICD-10-CM | POA: Diagnosis not present

## 2015-08-24 DIAGNOSIS — Z1239 Encounter for other screening for malignant neoplasm of breast: Secondary | ICD-10-CM

## 2015-08-24 DIAGNOSIS — E119 Type 2 diabetes mellitus without complications: Secondary | ICD-10-CM | POA: Diagnosis not present

## 2015-08-24 DIAGNOSIS — I10 Essential (primary) hypertension: Secondary | ICD-10-CM

## 2015-08-24 MED ORDER — LISINOPRIL 30 MG PO TABS: ORAL_TABLET | ORAL | Status: DC

## 2015-08-24 NOTE — Patient Instructions (Addendum)
Encouraged her to exercise 30-45 minutes 4-5 times per week. Eat a well balanced diet. Avoid smoking. Limit alcohol intake. Wear seatbelt when riding in the car. Wear sun block (SPF >50) when spending extended times outside.  Increase lisinopril to 30mg  daily  Continue zyrtec daily for seasonal allergy  Use benadryl as needed for breakthrough allergy symptoms  Continue other medications as ordered  Return to office for fasting labs in 1-2 weeks   Follow up in 1 month for HTN

## 2015-08-24 NOTE — Progress Notes (Signed)
Patient ID: Sarah Brock, female   DOB: 1956-04-14, 60 y.o.   MRN: [id number] Subjective:     Sarah Brock is a 60 y.o. female and is here for a comprehensive physical exam. The patient reports problems - seasonal allergy sx's.  She take iron supplement as Rx and does not feel as tired. Hair feels brittle. She drinks about 1.5 bottles of water daily. BS are not checked at home. She takes metformin and reports no diarrhea or low BS reactions. No diaphoresis or increased jitteriness. No seizures x few years. She takes keppra BID as Rx  Checks her BP occasionally at work and usually SBP 150s. She works at [employer] in [employer]. she has maintained healthy diet but has not really been exercising outside of work.  Past Medical History  Diagnosis Date  . Diabetes mellitus   . Seizure disorder ([place])   . Other specified cardiac dysrhythmias(427.89)   . Osteoarthrosis, unspecified whether generalized or localized, unspecified site   . Unspecified vitamin D deficiency   . Other and unspecified hyperlipidemia   . Anemia, unspecified   . Unspecified essential hypertension   . Other convulsions   . Other malaise and fatigue    Past Surgical History  Procedure Laterality Date  . Abdominal hysterectomy    . Breast lumpectomy Right 1997   Family History  Problem Relation Age of Onset  . Cancer Father     Esophageal    Social History   Social History  . Marital Status: Widowed    Spouse Name: N/A  . Number of Children: N/A  . Years of Education: N/A   Occupational History  . Not on file.   Social History Main Topics  . Smoking status: Never Smoker   . Smokeless tobacco: Never Used  . Alcohol Use: No  . Drug Use: No  . Sexual Activity: Not on file   Other Topics Concern  . Not on file   Social History Narrative   Health Maintenance  Topic Date Due  . Hepatitis C Screening  1956-03-18  . PNEUMOCOCCAL POLYSACCHARIDE VACCINE (1) 09/09/1957  . OPHTHALMOLOGY EXAM   09/09/1965  . HIV Screening  09/10/1970  . COLONOSCOPY  09/09/2005  . MAMMOGRAM  05/19/2011  . HEMOGLOBIN A1C  08/23/2015  . INFLUENZA VACCINE  01/24/2016  . FOOT EXAM  01/26/2016  . PAP SMEAR  08/10/2018  . TETANUS/TDAP  06/26/2019   Current Outpatient Prescriptions on File Prior to Visit  Medication Sig Dispense Refill  . ferrous sulfate 325 (65 FE) MG tablet Take 1 tablet (325 mg total) by mouth 2 (two) times daily with a meal. 60 tablet 2  . glucose blood test strip Use as instructed 100 each 12  . hydrochlorothiazide (HYDRODIURIL) 25 MG tablet TAKE 1 TABLET BY MOUTH TWICE DAILY TO CONTROL BLOOD PRESSURE 60 tablet 5  . levETIRAcetam (KEPPRA) 500 MG tablet Take one tablet by mouth every 12 hours for seizures 60 tablet 5  . lisinopril (PRINIVIL,ZESTRIL) 20 MG tablet Take one tablet by mouth once daily for blood pressure 30 tablet 5  . metFORMIN (GLUCOPHAGE) 500 MG tablet Take one tablet by mouth twice daily with meals to control blood sugar 60 tablet 5   No current facility-administered medications on file prior to visit.     Review of Systems   Review of Systems  Constitutional: Negative for fever, chills and malaise/fatigue.  HENT: Negative for sore throat and tinnitus.   Eyes: Negative for blurred vision and  double vision.  Respiratory: Negative for cough, shortness of breath and wheezing.   Cardiovascular: Negative for chest pain, palpitations, orthopnea and leg swelling.  Gastrointestinal: Negative for heartburn, nausea, vomiting, abdominal pain, diarrhea, constipation and blood in stool.  Genitourinary: Negative for dysuria, urgency, frequency and hematuria.  Musculoskeletal: Negative for myalgias, joint pain and falls.  Skin: Negative for rash.  Neurological: Negative for dizziness, tingling, tremors, sensory change, focal weakness, seizures, loss of consciousness, weakness and headaches.  Endo/Heme/Allergies: Positive for environmental allergies. Does not bruise/bleed  easily.  Psychiatric/Behavioral: Negative for depression and memory loss. The patient is not nervous/anxious and does not have insomnia.      Objective:   Filed Vitals:   08/24/15 1511  BP: 150/100  Pulse: 92  Temp: 97.8 F (36.6 C)  TempSrc: Oral  Resp: 18  Height: 5\' 3"  (1.6 m)  Weight: 174 lb 3.2 oz (79.017 kg)  SpO2: 98%       Physical Exam  Constitutional: She is oriented to person, place, and time and well-developed, well-nourished, and in no distress.  HENT:  Head: Normocephalic and atraumatic.  Right Ear: External ear normal.  Left Ear: External ear normal.  Mouth/Throat: Oropharynx is clear and moist. No oropharyngeal exudate.  Eyes: Conjunctivae and EOM are normal. Pupils are equal, round, and reactive to light. No scleral icterus.  Neck: Normal range of motion. Neck supple. Carotid bruit is not present. No tracheal deviation present. No thyromegaly present.  Cardiovascular: Normal rate, regular rhythm, normal heart sounds and intact distal pulses.  Exam reveals no gallop and no friction rub.   No murmur heard. Pulmonary/Chest: Effort normal and breath sounds normal. She has no wheezes. She has no rhonchi. She has no rales. She exhibits no tenderness. Right breast exhibits no inverted nipple, no mass, no nipple discharge, no skin change and no tenderness. Left breast exhibits no inverted nipple, no mass, no nipple discharge, no skin change and no tenderness. Breasts are symmetrical.  Abdominal: Soft. Bowel sounds are normal. She exhibits no distension and no mass. There is no hepatosplenomegaly. There is no tenderness. There is no rebound and no guarding.  Genitourinary:  deferred  Musculoskeletal: Normal range of motion.  Lymphadenopathy:    She has no cervical adenopathy.  Neurological: She is alert and oriented to person, place, and time. She has normal reflexes. Gait normal.  Skin: Skin is warm and dry. No rash noted.  Psychiatric: Mood, memory, affect and  judgment normal.   Diabetic Foot Exam - Simple   Simple Foot Form  Diabetic Foot exam was performed with the following findings:  Yes 08/24/2015 12:15 PM  Visual Inspection  No deformities, no ulcerations, no other skin breakdown bilaterally:  Yes  Sensation Testing  Pulse Check  Posterior Tibialis and Dorsalis pulse intact bilaterally:  Yes  Comments         Assessment:    Healthy female exam.       ICD-9-CM ICD-10-CM   1. Well adult exam V70.0 Z00.00   2. Essential hypertension - suboptimally controlled 401.9 I10 lisinopril (PRINIVIL,ZESTRIL) 30 MG tablet     Lipid Panel  3. Allergic rhinitis due to pollen 477.0 J30.1   4. Controlled type 2 diabetes mellitus without complication, without long-term current use of insulin (HCC) 250.00 [id number] Basic Metabolic Panel     ALT     Hemoglobin A1c     Lipid Panel     Urinalysis with Reflex Microscopic     Microalbumin/Creatinine Ratio, Urine  5.  Seizure disorder ([place]) 345.90 G40.909   6. Anemia, iron deficiency 280.9 D50.9   7. Screening for colon cancer V76.51 Z12.11 Ambulatory referral to Gastroenterology  8. Breast cancer screening V76.10 Z12.39 MM DIGITAL SCREENING BILATERAL    Plan:     See After Visit Summary for Counseling Recommendations    Pt is UTD on health maintenance. Vaccinations are UTD. Pt maintains a healthy lifestyle. Encouraged pt to exercise 30-45 minutes 4-5 times per week. Eat a well balanced diet. Avoid smoking. Limit alcohol intake. Wear seatbelt when riding in the car. Wear sun block (SPF >50) when spending extended times outside.  Increase lisinopril to 30mg  daily  Continue zyrtec daily for seasonal allergy  Use benadryl as needed for breakthrough allergy symptoms  Continue other medications as ordered  Return to office for fasting labs in 1-2 weeks   Follow up in 1 month for HTN  Aime Carreras S. Perlie Gold  [facility] [address] [place], [place] [address] [phone] Cell (Monday-Friday 8 AM - 5 PM) [phone] After 5 PM and follow prompts

## 2015-09-14 ENCOUNTER — Other Ambulatory Visit: Payer: Self-pay | Admitting: Internal Medicine

## 2015-09-14 ENCOUNTER — Other Ambulatory Visit: Payer: 59

## 2015-09-14 DIAGNOSIS — E119 Type 2 diabetes mellitus without complications: Secondary | ICD-10-CM

## 2015-09-14 DIAGNOSIS — I10 Essential (primary) hypertension: Secondary | ICD-10-CM

## 2015-09-15 LAB — BASIC METABOLIC PANEL
BUN / CREAT RATIO: 13 (ref 11–26)
BUN: 11 mg/dL (ref 8–27)
CO2: 22 mmol/L (ref 18–29)
CREATININE: 0.84 mg/dL (ref 0.57–1.00)
Calcium: 9.5 mg/dL (ref 8.7–10.3)
Chloride: 96 mmol/L (ref 96–106)
GFR, EST AFRICAN AMERICAN: 87 mL/min/{1.73_m2} (ref >59)
GFR, EST NON AFRICAN AMERICAN: 76 mL/min/{1.73_m2} (ref >59)
Glucose: 167 mg/dL — ABNORMAL HIGH (ref 65–99)
Potassium: 3.5 mmol/L (ref 3.5–5.2)
Sodium: 139 mmol/L (ref 134–144)

## 2015-09-15 LAB — LIPID PANEL
CHOLESTEROL TOTAL: 225 mg/dL — AB (ref 100–199)
Chol/HDL Ratio: 3.9 ratio units (ref 0.0–4.4)
HDL: 57 mg/dL (ref >39)
LDL CALC: 142 mg/dL — AB (ref 0–99)
TRIGLYCERIDES: 130 mg/dL (ref 0–149)
VLDL Cholesterol Cal: 26 mg/dL (ref 5–40)

## 2015-09-15 LAB — ALT: ALT: 21 IU/L (ref 0–32)

## 2015-09-15 LAB — HEMOGLOBIN A1C
ESTIMATED AVERAGE GLUCOSE: 166 mg/dL
Hgb A1c MFr Bld: 7.4 % — ABNORMAL HIGH (ref 4.8–5.6)

## 2015-09-15 MED FILL — LISINOPRIL 30 MG TABLET: 30 | 30 days supply | Qty: 30 | Fill #0

## 2015-09-15 MED FILL — metFORMIN HCL 500 MG TABS: 500 | 30 days supply | Qty: 60 | Fill #3

## 2015-09-15 MED FILL — levETIRAcetam 500 MG TABS: 500 | 30 days supply | Qty: 60 | Fill #3

## 2015-09-21 ENCOUNTER — Encounter: Payer: Self-pay | Admitting: Internal Medicine

## 2015-09-29 ENCOUNTER — Encounter: Payer: Self-pay | Admitting: Internal Medicine

## 2015-09-30 ENCOUNTER — Ambulatory Visit: Payer: 59 | Admitting: Internal Medicine

## 2015-10-03 ENCOUNTER — Telehealth: Payer: Self-pay | Admitting: Internal Medicine

## 2015-10-03 NOTE — Telephone Encounter (Signed)
FYI,   Several attempts have been made by [facility] and our office to schedule patient for her referral, including sending a letter to the patient. Patient not responding to attempts to schedule referral appointment.

## 2015-10-10 ENCOUNTER — Telehealth: Payer: Self-pay | Admitting: Internal Medicine

## 2015-10-10 NOTE — Telephone Encounter (Signed)
Noted  

## 2015-10-10 NOTE — Telephone Encounter (Signed)
Several attempts have been made by [facility] and our office to schedule patient for her referral, including sending a letter to the patient. Patient not responding to attempts to schedule appointment

## 2015-10-17 ENCOUNTER — Other Ambulatory Visit: Payer: Self-pay | Admitting: Internal Medicine

## 2015-10-17 ENCOUNTER — Other Ambulatory Visit: Payer: Self-pay | Admitting: *Deleted

## 2015-10-17 MED ORDER — LEVETIRACETAM 500 MG PO TABS: ORAL_TABLET | ORAL | Status: DC

## 2015-10-17 MED ORDER — HYDROCHLOROTHIAZIDE 25 MG PO TABS: ORAL_TABLET | ORAL | Status: DC

## 2015-10-17 MED FILL — LISINOPRIL 30 MG TABLET: 30 | 90 days supply | Qty: 90 | Fill #1

## 2015-10-17 MED FILL — metFORMIN HCL 500 MG TABS: 500 | 90 days supply | Qty: 180 | Fill #4

## 2015-10-17 MED FILL — levETIRAcetam 500 MG TABS: 500 | 90 days supply | Qty: 180 | Fill #0

## 2015-10-17 MED FILL — HYDROCHLOROTHIAZIDE 25 MG T: 25 | 90 days supply | Qty: 180 | Fill #0

## 2015-10-17 NOTE — Telephone Encounter (Signed)
[facility] called wanting refills on medications. Given.

## 2015-10-31 MED ADMIN — midazolam (VERSED) injection: 2 | INTRAVENOUS | @ 20:00:00 | NDC 00641605701

## 2015-10-31 MED ADMIN — vancomycin (VANCOCIN) injection: 1000 | INTRAVENOUS | @ 20:00:00 | NDC 00409653301

## 2015-10-31 MED ADMIN — dexamethasone (DECADRON) injection: 4 | INTRAVENOUS | @ 20:00:00 | NDC 63323016505

## 2015-10-31 MED ADMIN — ondansetron (ZOFRAN) injection: 4 | INTRAVENOUS | @ 22:00:00 | NDC 23155054731

## 2015-10-31 MED ADMIN — fentaNYL (PF) (SUBLIMAZE) injection 25-50 mcg: 25 ug | INTRAVENOUS | @ 23:00:00 | NDC 00641602701

## 2015-10-31 MED ADMIN — HYDROmorphone (DILAUDID) injection syringe 0.2-0.6 mg: 0.5 mg | INTRAVENOUS | @ 23:00:00 | NDC 00409128303

## 2015-10-31 MED ADMIN — phenylephrine (NEO-SYNEPHRINE) 10 mg in sodium chloride 0.9 % 100 mL infusion (Anesthesia Provider ONLY-Not for RN use): 25 mg | INTRAVENOUS | @ 20:00:00 | NDC 00641614201

## 2015-10-31 MED ADMIN — lidocaine (PF) (XYLOCAINE) 100 mg/5 mL (2 %) injection syringe: 60 | INTRAVENOUS | @ 20:00:00 | NDC 00409132305

## 2015-10-31 MED ADMIN — ceFAZolin (ANCEF) injection: 2000 | INTRAVENOUS | @ 20:00:00 | NDC 00143992490

## 2015-11-01 MED ADMIN — 0.9 % sodium chloride flush injection syringe: 3 mL | INTRAVENOUS | @ 04:00:00

## 2015-11-01 MED ADMIN — polyvinyl alcohol (LIQUITEARS) 1.4 % ophthalmic solution 2 drop: 2 [drp] | OPHTHALMIC | @ 03:00:00 | NDC 00904649235

## 2015-11-01 MED ADMIN — acetaminophen (TYLENOL) tablet/gelcap 500 mg: 500 mg | ORAL | @ 03:00:00 | NDC 00904581660

## 2015-11-01 MED ADMIN — 0.9 % sodium chloride flush injection syringe: 3 mL | INTRAVENOUS | @ 13:00:00

## 2015-11-01 MED ADMIN — traMADol (ULTRAM) tablet 50-100 mg: 50 mg | ORAL | @ 04:00:00 | NDC 51079099101

## 2015-11-28 ENCOUNTER — Ambulatory Visit (INDEPENDENT_AMBULATORY_CARE_PROVIDER_SITE_OTHER): Payer: 59 | Admitting: Nurse Practitioner

## 2015-11-28 ENCOUNTER — Encounter: Payer: Self-pay | Admitting: Nurse Practitioner

## 2015-11-28 VITALS — BP 148/90 | HR 91 | Temp 97.9°F | Resp 18 | Ht 63.0 in | Wt 175.8 lb

## 2015-11-28 DIAGNOSIS — L600 Ingrowing nail: Secondary | ICD-10-CM | POA: Diagnosis not present

## 2015-11-28 MED ORDER — DOXYCYCLINE HYCLATE 100 MG PO TABS: 100.0000 mg | ORAL_TABLET | Freq: Two times a day (BID) | ORAL | Status: DC

## 2015-11-28 MED FILL — DOXYCYCLINE HYCLATE 100 MG: 100 | 7 days supply | Qty: 14 | Fill #0

## 2015-11-28 NOTE — Patient Instructions (Signed)
Epsom salt soaks three times daily for 20- 30 mins  Doxycycline 100 mg twice daily for 1 week  Ingrown Toenail An ingrown toenail occurs when the corner or sides of your toenail grow into the surrounding skin. The big toe is most commonly affected, but it can happen to any of your toes. If your ingrown toenail is not treated, you will be at risk for infection. CAUSES This condition may be caused by:  Wearing shoes that are too small or tight.  Injury or trauma, such as stubbing your toe or having your toe stepped on.  Improper cutting or care of your toenails.  Being born with (congenital) nail or foot abnormalities, such as having a nail that is too big for your toe. RISK FACTORS Risk factors for an ingrown toenail include:  Age. Your nails tend to thicken as you get older, so ingrown nails are more common in older people.  Diabetes.  Cutting your toenails incorrectly.  Blood circulation problems. SYMPTOMS Symptoms may include:  Pain, soreness, or tenderness.  Redness.  Swelling.  Hardening of the skin surrounding the toe. Your ingrown toenail may be infected if there is fluid, pus, or drainage. DIAGNOSIS  An ingrown toenail may be diagnosed by medical history and physical exam. If your toenail is infected, your health care provider may test a sample of the drainage. TREATMENT Treatment depends on the severity of your ingrown toenail. Some ingrown toenails may be treated at home. More severe or infected ingrown toenails may require surgery to remove all or part of the nail. Infected ingrown toenails may also be treated with antibiotic medicines. HOME CARE INSTRUCTIONS  If you were prescribed an antibiotic medicine, finish all of it even if you start to feel better.  Soak your foot in warm soapy water for 20 minutes, 3 times per day or as directed by your health care provider.  Carefully lift the edge of the nail away from the sore skin by wedging a small piece of  cotton under the corner of the nail. This may help with the pain. Be careful not to cause more injury to the area.  Wear shoes that fit well. If your ingrown toenail is causing you pain, try wearing sandals, if possible.  Trim your toenails regularly and carefully. Do not cut them in a curved shape. Cut your toenails straight across. This prevents injury to the skin at the corners of the toenail.  Keep your feet clean and dry.  If you are having trouble walking and are given crutches by your health care provider, use them as directed.  Do not pick at your toenail or try to remove it yourself.  Take medicines only as directed by your health care provider.  Keep all follow-up visits as directed by your health care provider. This is important. SEEK MEDICAL CARE IF:  Your symptoms do not improve with treatment. SEEK IMMEDIATE MEDICAL CARE IF:  You have red streaks that start at your foot and go up your leg.  You have a fever.  You have increased redness, swelling, or pain.  You have fluid, blood, or pus coming from your toenail.   This information is not intended to replace advice given to you by your health care provider. Make sure you discuss any questions you have with your health care provider.   Document Released: 06/08/2000 Document Revised: 10/26/2014 Document Reviewed: 05/05/2014 Elsevier Interactive Patient Education [employer].

## 2015-11-28 NOTE — Progress Notes (Signed)
Patient ID: Sarah Brock, female   DOB: [birth year], 60 y.o.   MRN: [id number]    PCP: Gildardo Cranker, DO  Advanced Directive information Does patient have an advance directive?: Yes, Type of Advance Directive: Healthcare Power of Attorney  Allergies  Allergen Reactions  . Shrimp [Shellfish Allergy]   . Simvastatin Other (See Comments)    Stomach pain, low back pain     Chief Complaint  Patient presents with  . Acute Visit    right great toe pain; possible ingrown toenail. bleeding, draining pus x 2 weeks     HPI: Patient is a 60 y.o. female seen in the office today due to ongoing right toe pain. Pt with hx of DM, HTN, hyperlipidemia.  Pulled part toenail off about 3 weeks ago to left great toe and has been irritated since. 3 weeks ago draining puss and blood and she applied mupirocin ointment which helped. Still swollen and tender. Increased bleeding 3 days. Has stayed off of foot over the last 3 days. Keeping area dry and letting it get air. No fevers or chills.    Review of Systems:  Review of Systems  Constitutional: Negative for fever, chills, activity change, appetite change and fatigue.  Respiratory: Negative for shortness of breath.   Cardiovascular: Negative for chest pain and leg swelling.  Musculoskeletal: Positive for myalgias.  Skin: Positive for wound. Rash: open area beside nail to right great toe.    Past Medical History  Diagnosis Date  . Diabetes mellitus   . Seizure disorder ([place])   . Other specified cardiac dysrhythmias(427.89)   . Osteoarthrosis, unspecified whether generalized or localized, unspecified site   . Unspecified vitamin D deficiency   . Other and unspecified hyperlipidemia   . Anemia, unspecified   . Unspecified essential hypertension   . Other convulsions   . Other malaise and fatigue    Past Surgical History  Procedure Laterality Date  . Abdominal hysterectomy    . Breast lumpectomy Right 1997   Social History:   reports that  she has never smoked. She has never used smokeless tobacco. She reports that she does not drink alcohol or use illicit drugs.  Family History  Problem Relation Age of Onset  . Cancer Father     Esophageal    Medications: Patient's Medications  New Prescriptions   No medications on file  Previous Medications   FERROUS SULFATE 325 (65 FE) MG TABLET    Take 1 tablet (325 mg total) by mouth 2 (two) times daily with a meal.   GLUCOSE BLOOD TEST STRIP    Use as instructed   HYDROCHLOROTHIAZIDE (HYDRODIURIL) 25 MG TABLET    Take one tablet by mouth twice daily to control blood pressure   LEVETIRACETAM (KEPPRA) 500 MG TABLET    Take one tablet by mouth every 12 hours for seizures   LISINOPRIL (PRINIVIL,ZESTRIL) 30 MG TABLET    Take one tablet by mouth once daily for blood pressure   METFORMIN (GLUCOPHAGE) 500 MG TABLET    Take one tablet by mouth twice daily with meals to control blood sugar  Modified Medications   No medications on file  Discontinued Medications   No medications on file     Physical Exam:  Filed Vitals:   11/28/15 1106  BP: 148/90  Pulse: 91  Temp: 97.9 F (36.6 C)  TempSrc: Oral  Resp: 18  Height: 5\' 3"  (1.6 m)  Weight: 175 lb 12.8 oz (79.742 kg)  SpO2: 97%  Body mass index is 31.15 kg/(m^2).  Physical Exam  Constitutional: She is oriented to person, place, and time. She appears well-developed and well-nourished. No distress.  Cardiovascular: Normal rate, regular rhythm and normal heart sounds.   Pulmonary/Chest: Effort normal and breath sounds normal.  Musculoskeletal: She exhibits no edema.  Neurological: She is alert and oriented to person, place, and time.  Skin: Skin is warm and dry. She is not diaphoretic.  Exposed tissue to medial aspect of right great toenail, slight tenderness noted. no drainage noted.    Psychiatric: She has a normal mood and affect.    Labs reviewed: Basic Metabolic Panel:  Recent Labs  02/21/15 0810 09/14/15 0802    NA 139 139  K 3.5 3.5  CL 100 96  CO2 25 22  GLUCOSE 161* 167*  BUN 14 11  CREATININE 0.92 0.84  CALCIUM 9.8 9.5  TSH 1.590  --    Liver Function Tests:  Recent Labs  02/21/15 0810 09/14/15 0802  AST 19  --   ALT 21 21  ALKPHOS 65  --   BILITOT 0.2  --   PROT 7.3  --   ALBUMIN 4.1  --    No results for input(s): LIPASE, AMYLASE in the last 8760 hours. No results for input(s): AMMONIA in the last 8760 hours. CBC:  Recent Labs  02/21/15 0810  WBC 6.8  NEUTROABS 3.7  HCT 35.2  MCV 78*  PLT 270   Lipid Panel:  Recent Labs  02/21/15 0810 09/14/15 0802  CHOL 221* 225*  HDL 55 57  LDLCALC 146* 142*  TRIG 100 130  CHOLHDL 4.0 3.9   TSH:  Recent Labs  02/21/15 0810  TSH 1.590   A1C: Lab Results  Component Value Date   HGBA1C 7.4* 09/14/2015     Assessment/Plan 1. Ingrown right greater toenail -pt with hx of diabetes will treat with doxycycline 100 mg BID for 7 days  -warm epsom salt soaks 20 mins three times daily -avoid tight shoes -to not pick at toenails  - doxycycline (VIBRA-TABS) 100 MG tablet; Take 1 tablet (100 mg total) by mouth 2 (two) times daily.  Dispense: 14 tablet; Refill: 0 - to call if no improvement after 3-4 days may need podiatry referral  -call sooner if worsening -follow up in 1 week  Anna-Marie Coller K. Harle Battiest  [facility] [phone] 8 am - 5 pm) [phone] (after hours)

## 2015-12-05 ENCOUNTER — Encounter: Payer: Self-pay | Admitting: Nurse Practitioner

## 2015-12-05 ENCOUNTER — Ambulatory Visit (INDEPENDENT_AMBULATORY_CARE_PROVIDER_SITE_OTHER): Payer: 59 | Admitting: Nurse Practitioner

## 2015-12-05 VITALS — BP 128/88 | HR 73 | Temp 97.9°F | Resp 18 | Ht 63.0 in | Wt 175.2 lb

## 2015-12-05 DIAGNOSIS — L600 Ingrowing nail: Secondary | ICD-10-CM | POA: Diagnosis not present

## 2015-12-05 NOTE — Progress Notes (Signed)
Patient ID: RONIQUA MARRUFFO, female   DOB: 1956/04/03, 60 y.o.   MRN: [id number]    PCP: Gildardo Cranker, DO  Advanced Directive information Does patient have an advance directive?: No  Allergies  Allergen Reactions  . Shrimp [Shellfish Allergy]   . Simvastatin Other (See Comments)    Stomach pain, low back pain     Chief Complaint  Patient presents with  . Medical Management of Chronic Issues    Follow up on R large toe. No pain but there is a small amt of blood/drainage after bumping it     HPI: Patient is a 60 y.o. female seen in the office today for follow up right toe ingrown toenail. Hx of diabetes and was seen last week due to nonhealing right ingrown toenail with drainage. Pt completes her doxycycline today. Doing much better.  No pain, tenderness or drainage noted. Tripped yesterday and bumped her toe and thought it may become tender but it did not. No fever noted.   Review of Systems:  Review of Systems  Constitutional: Negative for fever, chills, activity change, appetite change and fatigue.  Respiratory: Negative for shortness of breath.   Cardiovascular: Negative for chest pain and leg swelling.  Musculoskeletal: Negative for myalgias.  Skin: Positive for wound (still remains with small open area beside great toenail).    Past Medical History  Diagnosis Date  . Diabetes mellitus   . Seizure disorder ([place])   . Other specified cardiac dysrhythmias(427.89)   . Osteoarthrosis, unspecified whether generalized or localized, unspecified site   . Unspecified vitamin D deficiency   . Other and unspecified hyperlipidemia   . Anemia, unspecified   . Unspecified essential hypertension   . Other convulsions   . Other malaise and fatigue    Past Surgical History  Procedure Laterality Date  . Abdominal hysterectomy    . Breast lumpectomy Right 1997   Social History:   reports that she has never smoked. She has never used smokeless tobacco. She reports that she does  not drink alcohol or use illicit drugs.  Family History  Problem Relation Age of Onset  . Cancer Father     Esophageal    Medications: Patient's Medications  New Prescriptions   No medications on file  Previous Medications   FERROUS SULFATE 325 (65 FE) MG TABLET    Take 1 tablet (325 mg total) by mouth 2 (two) times daily with a meal.   GLUCOSE BLOOD TEST STRIP    Use as instructed   HYDROCHLOROTHIAZIDE (HYDRODIURIL) 25 MG TABLET    Take one tablet by mouth twice daily to control blood pressure   LEVETIRACETAM (KEPPRA) 500 MG TABLET    Take one tablet by mouth every 12 hours for seizures   LISINOPRIL (PRINIVIL,ZESTRIL) 30 MG TABLET    Take one tablet by mouth once daily for blood pressure   METFORMIN (GLUCOPHAGE) 500 MG TABLET    Take one tablet by mouth twice daily with meals to control blood sugar  Modified Medications   No medications on file  Discontinued Medications   DOXYCYCLINE (VIBRA-TABS) 100 MG TABLET    Take 1 tablet (100 mg total) by mouth 2 (two) times daily.     Physical Exam:  Filed Vitals:   12/05/15 1621  BP: 128/88  Pulse: 73  Temp: 97.9 F (36.6 C)  TempSrc: Oral  Resp: 18  Height: 5\' 3"  (1.6 m)  Weight: 175 lb 3.2 oz (79.47 kg)  SpO2: 98%   Body mass  index is 31.04 kg/(m^2).  Physical Exam  Constitutional: She is oriented to person, place, and time. She appears well-developed and well-nourished. No distress.  Cardiovascular: Normal rate, regular rhythm and normal heart sounds.   Pulmonary/Chest: Effort normal and breath sounds normal.  Musculoskeletal: She exhibits no edema.  Neurological: She is alert and oriented to person, place, and time.  Skin: Skin is warm and dry. She is not diaphoretic.  Small amt of exposed tissue to medial aspect of right great toenail, nontender and no drainage noted.    Psychiatric: She has a normal mood and affect.    Labs reviewed: Basic Metabolic Panel:  Recent Labs  02/21/15 0810 09/14/15 0802  NA 139  139  K 3.5 3.5  CL 100 96  CO2 25 22  GLUCOSE 161* 167*  BUN 14 11  CREATININE 0.92 0.84  CALCIUM 9.8 9.5  TSH 1.590  --    Liver Function Tests:  Recent Labs  02/21/15 0810 09/14/15 0802  AST 19  --   ALT 21 21  ALKPHOS 65  --   BILITOT 0.2  --   PROT 7.3  --   ALBUMIN 4.1  --    No results for input(s): LIPASE, AMYLASE in the last 8760 hours. No results for input(s): AMMONIA in the last 8760 hours. CBC:  Recent Labs  02/21/15 0810  WBC 6.8  NEUTROABS 3.7  HCT 35.2  MCV 78*  PLT 270   Lipid Panel:  Recent Labs  02/21/15 0810 09/14/15 0802  CHOL 221* 225*  HDL 55 57  LDLCALC 146* 142*  TRIG 100 130  CHOLHDL 4.0 3.9   TSH:  Recent Labs  02/21/15 0810  TSH 1.590   A1C: Lab Results  Component Value Date   HGBA1C 7.4* 09/14/2015     Assessment/Plan 1. Ingrown right greater toenail -improved, great toe still with minimal exposed tissue but no erythema, drainage or tenderness. To cont epsom salt soaks for an additional week. To go to podiatry if does not completely heal after 1 week, tenderness or redness reoccurs.     Carlos American. Harle Battiest  [facility] [phone] 8 am - 5 pm) [phone] (after hours)

## 2015-12-05 NOTE — Patient Instructions (Signed)
Cont epsom salt soaks for another week or until completely healed Podiatry if pain or redness reoccurs or does not completely heal

## 2016-01-19 MED FILL — LISINOPRIL 30 MG TABLET: 30 | 90 days supply | Qty: 90 | Fill #2

## 2016-01-19 MED FILL — HYDROCHLOROTHIAZIDE 25 MG T: 25 | 90 days supply | Qty: 180 | Fill #1

## 2016-01-19 MED FILL — metFORMIN HCL 500 MG TABS: 500 | 90 days supply | Qty: 180 | Fill #5

## 2016-01-19 MED FILL — levETIRAcetam 500 MG TABS: 500 | 90 days supply | Qty: 180 | Fill #1

## 2016-02-17 ENCOUNTER — Emergency Department (HOSPITAL_COMMUNITY)
Admission: EM | Admit: 2016-02-17 | Discharge: 2016-02-17 | Disposition: A | Discharge status: Home / Self Care | Payer: 59 | Attending: Emergency Medicine | Admitting: Emergency Medicine

## 2016-02-17 ENCOUNTER — Emergency Department (HOSPITAL_COMMUNITY): Payer: 59

## 2016-02-17 ENCOUNTER — Encounter (HOSPITAL_COMMUNITY): Payer: Self-pay | Admitting: Vascular Surgery

## 2016-02-17 DIAGNOSIS — I10 Essential (primary) hypertension: Secondary | ICD-10-CM | POA: Insufficient documentation

## 2016-02-17 DIAGNOSIS — Z7984 Long term (current) use of oral hypoglycemic drugs: Secondary | ICD-10-CM | POA: Diagnosis not present

## 2016-02-17 DIAGNOSIS — E119 Type 2 diabetes mellitus without complications: Secondary | ICD-10-CM | POA: Diagnosis not present

## 2016-02-17 DIAGNOSIS — K8020 Calculus of gallbladder without cholecystitis without obstruction: Secondary | ICD-10-CM | POA: Diagnosis not present

## 2016-02-17 DIAGNOSIS — Z79899 Other long term (current) drug therapy: Secondary | ICD-10-CM | POA: Insufficient documentation

## 2016-02-17 DIAGNOSIS — M545 Low back pain, unspecified: Secondary | ICD-10-CM

## 2016-02-17 DIAGNOSIS — K8050 Calculus of bile duct without cholangitis or cholecystitis without obstruction: Secondary | ICD-10-CM | POA: Diagnosis not present

## 2016-02-17 DIAGNOSIS — R1030 Lower abdominal pain, unspecified: Secondary | ICD-10-CM | POA: Diagnosis not present

## 2016-02-17 LAB — COMPREHENSIVE METABOLIC PANEL
ALK PHOS: 64 U/L (ref 38–126)
ALT: 18 U/L (ref 14–54)
AST: 18 U/L (ref 15–41)
Albumin: 4 g/dL (ref 3.5–5.0)
Anion gap: 11 (ref 5–15)
BUN: 11 mg/dL (ref 6–20)
CALCIUM: 9.4 mg/dL (ref 8.9–10.3)
CHLORIDE: 99 mmol/L — AB (ref 101–111)
CO2: 26 mmol/L (ref 22–32)
CREATININE: 0.86 mg/dL (ref 0.44–1.00)
GFR calc Af Amer: >60 mL/min (ref >60)
GFR calc non Af Amer: >60 mL/min (ref >60)
Glucose, Bld: 167 mg/dL — ABNORMAL HIGH (ref 65–99)
Potassium: 3.3 mmol/L — ABNORMAL LOW (ref 3.5–5.1)
SODIUM: 136 mmol/L (ref 135–145)
Total Bilirubin: 0.8 mg/dL (ref 0.3–1.2)
Total Protein: 7.7 g/dL (ref 6.5–8.1)

## 2016-02-17 LAB — CBC
HCT: 38.2 % (ref 36.0–46.0)
Hemoglobin: 12.1 g/dL (ref 12.0–15.0)
MCH: 25.3 pg — AB (ref 26.0–34.0)
MCHC: 31.7 g/dL (ref 30.0–36.0)
MCV: 79.9 fL (ref 78.0–100.0)
PLATELETS: 284 10*3/uL (ref 150–400)
RBC: 4.78 MIL/uL (ref 3.87–5.11)
RDW: 13.4 % (ref 11.5–15.5)
WBC: 7.4 10*3/uL (ref 4.0–10.5)

## 2016-02-17 LAB — URINALYSIS, ROUTINE W REFLEX MICROSCOPIC
BILIRUBIN URINE: NEGATIVE
GLUCOSE, UA: NEGATIVE mg/dL
HGB URINE DIPSTICK: NEGATIVE
KETONES UR: 40 mg/dL — AB
Leukocytes, UA: NEGATIVE
Nitrite: NEGATIVE
PROTEIN: NEGATIVE mg/dL
Specific Gravity, Urine: 1.027 (ref 1.005–1.030)
pH: 5.5 (ref 5.0–8.0)

## 2016-02-17 LAB — I-STAT TROPONIN, ED: Troponin i, poc: 0.01 ng/mL (ref 0.00–0.08)

## 2016-02-17 LAB — LIPASE, BLOOD: LIPASE: 24 U/L (ref 11–51)

## 2016-02-17 MED ORDER — GI COCKTAIL ~~LOC~~
30.0000 mL | Freq: Once | ORAL | Status: AC
  Administered 2016-02-17: 30 mL via ORAL
  Filled 2016-02-17: qty 30

## 2016-02-17 MED ORDER — CYCLOBENZAPRINE HCL 10 MG PO TABS: 10.0000 mg | ORAL_TABLET | Freq: Two times a day (BID) | ORAL | 0 refills | Status: DC | PRN

## 2016-02-17 MED ORDER — ONDANSETRON 4 MG PO TBDP: 4.0000 mg | ORAL_TABLET | Freq: Three times a day (TID) | ORAL | 0 refills | Status: DC | PRN

## 2016-02-17 MED ORDER — SODIUM CHLORIDE 0.9 % IV BOLUS (SEPSIS)
1000.0000 mL | Freq: Once | INTRAVENOUS | Status: AC
  Administered 2016-02-17: 1000 mL via INTRAVENOUS

## 2016-02-17 MED ORDER — IBUPROFEN 800 MG PO TABS: 800.0000 mg | ORAL_TABLET | Freq: Three times a day (TID) | ORAL | 0 refills | Status: DC

## 2016-02-17 MED ORDER — MORPHINE SULFATE (PF) 4 MG/ML IV SOLN
4.0000 mg | Freq: Once | INTRAVENOUS | Status: AC
Start: 2016-02-17 — End: 2016-02-17
  Administered 2016-02-17: 4 mg via INTRAVENOUS
  Filled 2016-02-17: qty 1

## 2016-02-17 MED ORDER — OXYCODONE-ACETAMINOPHEN 5-325 MG PO TABS: 1.0000 | ORAL_TABLET | Freq: Three times a day (TID) | ORAL | 0 refills | Status: DC | PRN

## 2016-02-17 MED ORDER — ONDANSETRON HCL 4 MG/2ML IJ SOLN
4.0000 mg | Freq: Once | INTRAMUSCULAR | Status: AC
  Administered 2016-02-17: 4 mg via INTRAVENOUS
  Filled 2016-02-17: qty 2

## 2016-02-17 NOTE — ED Notes (Signed)
PA at bedside.

## 2016-02-17 NOTE — ED Notes (Signed)
Pt out of room to xray 

## 2016-02-17 NOTE — ED Triage Notes (Signed)
Pt reports to the ED for eval of generalized abd pain and low back pain since Saturday. She reports the pain started in her right flank area and now she has progressively developed abd pain and low back pain. Reports nausea but denies any active V/D. She has been urinating more frequently. Denies any dysuria. Pt A&Ox4, resp e/u, and skin warm and dry. Hx of gall stones, states this feels similar.

## 2016-02-17 NOTE — ED Notes (Signed)
EDP at bedside  

## 2016-02-17 NOTE — ED Provider Notes (Signed)
[facility] DEPT Provider Note   CSN: [id number] Arrival date & time: 02/17/16  1442     History   Chief Complaint Chief Complaint  Patient presents with  . Back Pain    HPI Sarah Brock is a 60 y.o. female.  HPI  Sarah Brock is a 56-year-old female with history of diabetes, seizure disorder, and hypertension, she presents emergency Department with complaint of rapidly worsening low back pain with radiation to her side and abdomen. She describes generalized abdominal pain currently that is constant throughout the day associated with nausea and dyspepsia. She denies urinary symptoms, normal bowel movement this morning without melena or hematochezia. She denies vomiting.  Her pain initially started in her low back and feels achy like a muscle spasm. It has intermittently radiated sharp pains to her flank, shoulder blades, and right upper quadrant.  This occurred 2 times today.  Pain is 10/10, worse with laying flat on her back and improved with sitting upright and leaning forward.  She denies any reflux symptoms.  She did not eat much today, secondary to nausea, however before coming to the ER she did eat a banana and states that she did not notice any change in her pain.  Patient denies any fever, sweats, chills, chest pain, shortness of breath.  No other acute or associated symptoms.   Past Medical History:  Diagnosis Date  . Anemia, unspecified   . Diabetes mellitus   . Osteoarthrosis, unspecified whether generalized or localized, unspecified site   . Other and unspecified hyperlipidemia   . Other convulsions   . Other malaise and fatigue   . Other specified cardiac dysrhythmias(427.89)   . Seizure disorder ([place])   . Unspecified essential hypertension   . Unspecified vitamin D deficiency     Patient Active Problem List   Diagnosis Date Noted  . Anemia, iron deficiency 05/19/2014  . Essential hypertension 05/19/2014  . Annual physical exam 05/19/2014  . Unspecified  constipation 04/15/2013  . Diabetes mellitus type 2, controlled, without complications ([place]) 99991111  . Seizure disorder ([place])   . Anemia, unspecified   . Unspecified essential hypertension     Past Surgical History:  Procedure Laterality Date  . ABDOMINAL HYSTERECTOMY    . BREAST LUMPECTOMY Right 1997    OB History    No data available       Home Medications    Prior to Admission medications   Medication Sig Start Date End Date Taking? Authorizing Provider  cyclobenzaprine (FLEXERIL) 10 MG tablet Take 1 tablet (10 mg total) by mouth 2 (two) times daily as needed for muscle spasms. 02/17/16   Delsa Grana, PA-C  ferrous sulfate 325 (65 FE) MG tablet Take 1 tablet (325 mg total) by mouth 2 (two) times daily with a meal. 02/26/14   Blanchie Serve, MD  glucose blood test strip Use as instructed 02/24/14   Blanchie Serve, MD  hydrochlorothiazide (HYDRODIURIL) 25 MG tablet Take one tablet by mouth twice daily to control blood pressure 10/17/15   Gildardo Cranker, DO  ibuprofen (ADVIL,MOTRIN) 800 MG tablet Take 1 tablet (800 mg total) by mouth 3 (three) times daily. 02/17/16   Delsa Grana, PA-C  levETIRAcetam (KEPPRA) 500 MG tablet Take one tablet by mouth every 12 hours for seizures 10/17/15   Gildardo Cranker, DO  lisinopril (PRINIVIL,ZESTRIL) 30 MG tablet Take one tablet by mouth once daily for blood pressure 08/24/15   Gildardo Cranker, DO  metFORMIN (GLUCOPHAGE) 500 MG tablet Take one tablet by mouth twice  daily with meals to control blood sugar 06/17/15   Gildardo Cranker, DO  ondansetron (ZOFRAN ODT) 4 MG disintegrating tablet Take 1 tablet (4 mg total) by mouth every 8 (eight) hours as needed for nausea or vomiting. 02/17/16   Delsa Grana, PA-C  oxyCODONE-acetaminophen (PERCOCET) 5-325 MG tablet Take 1 tablet by mouth every 8 (eight) hours as needed for severe pain. 02/17/16   Delsa Grana, PA-C    Family History Family History  Problem Relation Age of Onset  . Cancer Father     Esophageal     Social History Social History  Substance Use Topics  . Smoking status: Never Smoker  . Smokeless tobacco: Never Used  . Alcohol use No     Allergies   Shrimp [shellfish allergy] and Simvastatin   Review of Systems Review of Systems  All other systems reviewed and are negative.    Physical Exam Updated Vital Signs BP 175/89   Pulse 72   Temp 98.3 F (36.8 C) (Oral)   Resp 14   Ht 5\' 2"  (1.575 m)   Wt 79 kg   SpO2 94%   BMI 31.86 kg/m   Physical Exam  Constitutional: She is oriented to person, place, and time. She appears well-developed and well-nourished. No distress.  Uncomfortable appearing female, holding upper abdomen, rocking back and forth in ER gurney, non-toxic appearing, NAD  HENT:  Head: Normocephalic and atraumatic.  Nose: Nose normal.  Mouth/Throat: Oropharynx is clear and moist. No oropharyngeal exudate.  Eyes: Conjunctivae and EOM are normal. Pupils are equal, round, and reactive to light. Right eye exhibits no discharge. Left eye exhibits no discharge. No scleral icterus.  Neck: Normal range of motion. Neck supple. No JVD present.  Cardiovascular: Normal rate, regular rhythm, normal heart sounds and intact distal pulses.  Exam reveals no gallop and no friction rub.   No murmur heard. Pulmonary/Chest: Effort normal and breath sounds normal. No respiratory distress. She has no wheezes. She has no rales. She exhibits no tenderness.  Abdominal: Soft. Bowel sounds are normal. She exhibits no distension and no mass. There is tenderness. There is no rebound.  Obese abdomen, nondistended, soft, generalized tenderness to palpation, most tender with voluntary guarding in right upper quadrant.  Abdomen somewhat tympanic to percussion, no CVA tenderness bilaterally  Musculoskeletal: Normal range of motion. She exhibits no edema or tenderness.  Lymphadenopathy:    She has no cervical adenopathy.  Neurological: She is alert and oriented to person, place, and  time. She exhibits normal muscle tone. Coordination normal.  Skin: Skin is warm and dry. Capillary refill takes less than 2 seconds. No rash noted. She is not diaphoretic. No erythema. No pallor.  Psychiatric: She has a normal mood and affect. Her behavior is normal. Judgment and thought content normal.  Nursing note and vitals reviewed.    ED Treatments / Results  Labs (all labs ordered are listed, but only abnormal results are displayed) Labs Reviewed  COMPREHENSIVE METABOLIC PANEL - Abnormal; Notable for the following:       Result Value   Potassium 3.3 (*)    Chloride 99 (*)    Glucose, Bld 167 (*)    All other components within normal limits  CBC - Abnormal; Notable for the following:    MCH 25.3 (*)    All other components within normal limits  URINALYSIS, ROUTINE W REFLEX MICROSCOPIC (NOT AT [facility]) - Abnormal; Notable for the following:    Ketones, ur 40 (*)    All  other components within normal limits  LIPASE, BLOOD  I-STAT TROPOININ, ED    EKG  EKG Interpretation None       Radiology Dg Abdomen Acute W/chest  Result Date: 02/17/2016 CLINICAL DATA:  Lower abdominal pain, nausea for 1 week. EXAM: DG ABDOMEN ACUTE W/ 1V CHEST COMPARISON:  None. FINDINGS: Cardiomediastinal silhouette is normal. Lungs are clear, no pleural effusions. No pneumothorax. Soft tissue planes and included osseous structures are unremarkable. Bowel gas pattern is nondilated and nonobstructive. No intra-abdominal mass effect, or free air. At least 4 cm faintly calcified potentially laminated mass RIGHT mid abdomen. Soft tissue planes and included osseous structures are non-suspicious. IMPRESSION: Normal chest. Normal bowel gas pattern. Calcified mass RIGHT abdomen likely represents gallstone. Electronically Signed   By: Elon Alas M.D.   On: 02/17/2016 22:44   US Abdomen Limited Ruq  Result Date: 02/17/2016 CLINICAL DATA:  Nausea for 1 week, RIGHT upper quadrant tenderness. EXAM: US ABDOMEN  LIMITED - RIGHT UPPER QUADRANT COMPARISON:  None. FINDINGS: Gallbladder: 4.3 cm echogenic gallstone with acoustic shadowing. Additional 2.1 cm gallstone at the gallbladder neck. Layering gallbladder sludge. Mild gallbladder wall thickening. No gallbladder distension. No pericholecystic fluid or sonographic Murphy's sign elicited. Common bile duct: Diameter: 3 mm Liver: No focal lesion identified. Within normal limits in parenchymal echogenicity. Hepatopetal portal vein. IMPRESSION: Cholelithiasis and gallbladder wall thickening without corroborative findings of acute cholecystitis. Electronically Signed   By: Elon Alas M.D.   On: 02/17/2016 22:46    Procedures Procedures (including critical care time)  Medications Ordered in ED Medications  sodium chloride 0.9 % bolus 1,000 mL (0 mLs Intravenous Stopped 02/17/16 2323)  ondansetron (ZOFRAN) injection 4 mg (4 mg Intravenous Given 02/17/16 2214)  morphine 4 MG/ML injection 4 mg (4 mg Intravenous Given 02/17/16 2214)  gi cocktail (Maalox,Lidocaine,Donnatal) (30 mLs Oral Given 02/17/16 2214)     Initial Impression / Assessment and Plan / ED Course  I have reviewed the triage vital signs and the nursing notes.  Pertinent labs & imaging results that were available during my care of the patient were reviewed by me and considered in my medical decision making (see chart for details).  Clinical Course  60 year old female history of diabetes, hypertension and seizure disorder, presents with 6 days of gradually worsening low back pain with radiation to right side, flank and now generalized abdominal pain, has intermittent episodes of briefly worsening pain. Labs obtained prior to time of evaluation I significant for mild hypokalemia and urinalysis were known for mild ketones otherwise unremarkable. On exam patient has generalized abdominal tenderness to palpation, more tender in right upper quadrant with voluntary guarding, she is unable to lay flat  stating this exacerbates her pain.   Will evaluate with plain films of chest and abdomen, RUQ [place], EKG was obtained earlier, will add troponin, with 60 y/o female with DM. Pain managed with morphine, zofran given, GI cocktail given, will re-evaluated   Patient feels much better after fluids and pain medication. Ultrasound was significant for cholelithiasis without evidence of cholecystitis. Symptoms are consistent with biliary colic, will send her to general surgery for follow-up.  Patient is tolerating PO's.  Discharged in good condition.  Return precautions reviewed.  Final Clinical Impressions(s) / ED Diagnoses   Final diagnoses:  Biliary colic  Midline low back pain without sciatica    New Prescriptions New Prescriptions   CYCLOBENZAPRINE (FLEXERIL) 10 MG TABLET    Take 1 tablet (10 mg total) by mouth 2 (two) times daily  as needed for muscle spasms.   IBUPROFEN (ADVIL,MOTRIN) 800 MG TABLET    Take 1 tablet (800 mg total) by mouth 3 (three) times daily.   ONDANSETRON (ZOFRAN ODT) 4 MG DISINTEGRATING TABLET    Take 1 tablet (4 mg total) by mouth every 8 (eight) hours as needed for nausea or vomiting.   OXYCODONE-ACETAMINOPHEN (PERCOCET) 5-325 MG TABLET    Take 1 tablet by mouth every 8 (eight) hours as needed for severe pain.     Delsa Grana, PA-C 02/17/16 2325    Dorie Rank, MD 02/18/16 [phone]

## 2016-02-23 ENCOUNTER — Ambulatory Visit: Payer: Self-pay | Admitting: General Surgery

## 2016-02-23 DIAGNOSIS — K8020 Calculus of gallbladder without cholecystitis without obstruction: Secondary | ICD-10-CM | POA: Diagnosis not present

## 2016-02-24 ENCOUNTER — Encounter (HOSPITAL_COMMUNITY): Payer: Self-pay

## 2016-02-24 ENCOUNTER — Encounter (HOSPITAL_COMMUNITY)
Admission: RE | Admit: 2016-02-24 | Discharge: 2016-02-24 | Disposition: A | Discharge status: Home / Self Care | Payer: 59 | Source: Ambulatory Visit | Attending: General Surgery | Admitting: General Surgery

## 2016-02-24 DIAGNOSIS — Z01812 Encounter for preprocedural laboratory examination: Secondary | ICD-10-CM | POA: Insufficient documentation

## 2016-02-24 DIAGNOSIS — K8000 Calculus of gallbladder with acute cholecystitis without obstruction: Secondary | ICD-10-CM | POA: Diagnosis not present

## 2016-02-24 HISTORY — DX: Personal history of other medical treatment: Z92.89

## 2016-02-24 HISTORY — DX: Unspecified asthma, uncomplicated: J45.909

## 2016-02-24 LAB — GLUCOSE, CAPILLARY: Glucose-Capillary: 199 mg/dL — ABNORMAL HIGH (ref 65–99)

## 2016-02-24 NOTE — Pre-Procedure Instructions (Addendum)
Sarah Brock  02/24/2016     Your procedure is scheduled on Tuesday, September 5.  Report to [facility] Admitting at  9:45 A.M.                Your surgery or procedure is scheduled for 11:45 AM   Call this number if you have problems the morning of [phone]   Remember:  Do not eat food or drink liquids after midnight Monday, September 4.  Take these medicines the morning of surgery with A SIP OF WATER :levETIRAcetam (KEPPRA).               Take if needed:cyclobenzaprine (FLEXERIL), ondansetron (ZOFRAN ODT). Take if needed and you tolerate on an empty stomach: oxyCODONE-acetaminophen (PERCOCET).                Do not take oral diabetes medicines (pills) the morning of surgery. top taking Aspirin,and Herbal  medications.  Do not take any NSAIDs ie: Ibuprofen,  Advil,Naproxen or any medication containing Aspirin. How to Manage Your Diabetes Before and After Surgery Why is it important to control my blood sugar before and after surgery? . Improving blood sugar levels before and after surgery helps healing and can limit problems. . A way of improving blood sugar control is eating a healthy diet by: o  Eating less sugar and carbohydrates o  Increasing activity/exercise o  Talking with your doctor about reaching your blood sugar goals . High blood sugars (greater than 180 mg/dL) can raise your risk of infections and slow your recovery, so you will need to focus on controlling your diabetes during the weeks before surgery. . Make sure that the doctor who takes care of your diabetes knows about your planned surgery including the date and location.  How do I manage my blood sugar before surgery? . Check your blood sugar at least 4 times a day, starting 2 days before surgery, to make sure that the level is not too high or low. o Check your blood sugar the morning of your surgery when you wake up and every 2 hours until you get to the Short Stay unit. . If your blood  sugar is less than 70 mg/dL, you will need to treat for low blood sugar: o Do not take insulin. o Treat a low blood sugar (less than 70 mg/dL) with  cup of clear juice (cranberry or apple), 4 glucose tablets, OR glucose gel. o Recheck blood sugar in 15 minutes after treatment (to make sure it is greater than 70 mg/dL). If your blood sugar is not greater than 70 mg/dL on recheck, call [phone] for further instructions. . Report your blood sugar to the short stay nurse when you get to Short Stay.  . If you are admitted to the hospital after surgery: o Your blood sugar will be checked by the staff and you will probably be given insulin after surgery (instead of oral diabetes medicines) to make sure you have good blood sugar levels. o The goal for blood sugar control after surgery is 80-180 mg/dL.  WHAT DO I DO ABOUT MY DIABETES MEDICATION?  Marland Kitchen Do not take oral diabetes medicines (pills) the morning of surgery. . The day of surgery, do not take other diabetes injectables, including Byetta (exenatide), Bydureon (exenatide ER), Victoza (liraglutide), or Trulicity (dulaglutide).  Patient Signature:  Date:   Nurse Signature:  Date:   Do not wear jewelry, make-up or nail polish.  Do not wear lotions, powders,  or perfumes, or deoderant.  Do not shave 48 hours prior to surgery.  Men may shave face and neck.  Do not bring valuables to the hospital.  [facility] is not responsible for any belongings or valuables.  Contacts, dentures or bridgework may not be worn into surgery.  Leave your suitcase in the car.  After surgery it may be brought to your room.  For patients admitted to the hospital, discharge time will be determined by your treatment team.  Patients discharged the day of surgery will not be allowed to drive home.  Special instructions: Review  [name] - Preparing For Surgery. Please read over the following fact sheets that you were given:  Review  [name] - Preparing For  Surgery , Pain Management, Coughing and Deep Breathing

## 2016-02-25 LAB — HEMOGLOBIN A1C
Hgb A1c MFr Bld: 9.5 % — ABNORMAL HIGH (ref 4.8–5.6)
Mean Plasma Glucose: 226 mg/dL

## 2016-02-28 ENCOUNTER — Ambulatory Visit (HOSPITAL_COMMUNITY): Payer: 59 | Admitting: Certified Registered Nurse Anesthetist

## 2016-02-28 ENCOUNTER — Ambulatory Visit (HOSPITAL_COMMUNITY)
Admission: RE | Admit: 2016-02-28 | Discharge: 2016-02-28 | Disposition: A | Discharge status: Home / Self Care | Payer: 59 | Source: Ambulatory Visit | Attending: General Surgery | Admitting: General Surgery

## 2016-02-28 ENCOUNTER — Encounter (HOSPITAL_COMMUNITY)
Admission: RE | Disposition: A | Discharge status: Home / Self Care | Payer: Self-pay | Source: Ambulatory Visit | Attending: General Surgery

## 2016-02-28 ENCOUNTER — Encounter (HOSPITAL_COMMUNITY): Payer: Self-pay | Admitting: *Deleted

## 2016-02-28 DIAGNOSIS — I10 Essential (primary) hypertension: Secondary | ICD-10-CM | POA: Diagnosis not present

## 2016-02-28 DIAGNOSIS — G40909 Epilepsy, unspecified, not intractable, without status epilepticus: Secondary | ICD-10-CM | POA: Insufficient documentation

## 2016-02-28 DIAGNOSIS — Z7984 Long term (current) use of oral hypoglycemic drugs: Secondary | ICD-10-CM | POA: Insufficient documentation

## 2016-02-28 DIAGNOSIS — K810 Acute cholecystitis: Secondary | ICD-10-CM | POA: Diagnosis not present

## 2016-02-28 DIAGNOSIS — M1990 Unspecified osteoarthritis, unspecified site: Secondary | ICD-10-CM | POA: Diagnosis not present

## 2016-02-28 DIAGNOSIS — Z90710 Acquired absence of both cervix and uterus: Secondary | ICD-10-CM | POA: Insufficient documentation

## 2016-02-28 DIAGNOSIS — E785 Hyperlipidemia, unspecified: Secondary | ICD-10-CM | POA: Insufficient documentation

## 2016-02-28 DIAGNOSIS — E119 Type 2 diabetes mellitus without complications: Secondary | ICD-10-CM | POA: Insufficient documentation

## 2016-02-28 DIAGNOSIS — K8010 Calculus of gallbladder with chronic cholecystitis without obstruction: Secondary | ICD-10-CM | POA: Diagnosis not present

## 2016-02-28 DIAGNOSIS — K8012 Calculus of gallbladder with acute and chronic cholecystitis without obstruction: Secondary | ICD-10-CM | POA: Insufficient documentation

## 2016-02-28 HISTORY — PX: CHOLECYSTECTOMY: SHX55

## 2016-02-28 LAB — CBC WITH DIFFERENTIAL/PLATELET
BASOS ABS: 0.1 10*3/uL (ref 0.0–0.1)
BASOS PCT: 1 %
Eosinophils Absolute: 0.3 10*3/uL (ref 0.0–0.7)
Eosinophils Relative: 5 %
HEMATOCRIT: 39.5 % (ref 36.0–46.0)
HEMOGLOBIN: 12.6 g/dL (ref 12.0–15.0)
Lymphocytes Relative: 36 %
Lymphs Abs: 2.6 10*3/uL (ref 0.7–4.0)
MCH: 25.6 pg — ABNORMAL LOW (ref 26.0–34.0)
MCHC: 31.9 g/dL (ref 30.0–36.0)
MCV: 80.1 fL (ref 78.0–100.0)
Monocytes Absolute: 0.4 10*3/uL (ref 0.1–1.0)
Monocytes Relative: 5 %
NEUTROS ABS: 3.9 10*3/uL (ref 1.7–7.7)
NEUTROS PCT: 53 %
Platelets: 426 10*3/uL — ABNORMAL HIGH (ref 150–400)
RBC: 4.93 MIL/uL (ref 3.87–5.11)
RDW: 13.3 % (ref 11.5–15.5)
WBC: 7.2 10*3/uL (ref 4.0–10.5)

## 2016-02-28 LAB — GLUCOSE, CAPILLARY
GLUCOSE-CAPILLARY: 184 mg/dL — AB (ref 65–99)
Glucose-Capillary: 189 mg/dL — ABNORMAL HIGH (ref 65–99)

## 2016-02-28 SURGERY — LAPAROSCOPIC CHOLECYSTECTOMY
Anesthesia: General | Site: Abdomen

## 2016-02-28 MED ORDER — LACTATED RINGERS IV SOLN
INTRAVENOUS | Status: DC
  Administered 2016-02-28 (×3): via INTRAVENOUS

## 2016-02-28 MED ORDER — MIDAZOLAM HCL 2 MG/2ML IJ SOLN
INTRAMUSCULAR | Status: AC
  Filled 2016-02-28: qty 2

## 2016-02-28 MED ORDER — FENTANYL CITRATE (PF) 100 MCG/2ML IJ SOLN
INTRAMUSCULAR | Status: DC
Start: 2016-02-28 — End: 2016-02-28
  Filled 2016-02-28: qty 2

## 2016-02-28 MED ORDER — PROPOFOL 10 MG/ML IV BOLUS
INTRAVENOUS | Status: AC
  Filled 2016-02-28: qty 20

## 2016-02-28 MED ORDER — LIDOCAINE 2% (20 MG/ML) 5 ML SYRINGE
INTRAMUSCULAR | Status: AC
  Filled 2016-02-28: qty 20

## 2016-02-28 MED ORDER — 0.9 % SODIUM CHLORIDE (POUR BTL) OPTIME
TOPICAL | Status: DC | PRN
  Administered 2016-02-28: 1000 mL

## 2016-02-28 MED ORDER — CEFOTETAN DISODIUM-DEXTROSE 2-2.08 GM-% IV SOLR
2.0000 g | INTRAVENOUS | Status: AC
  Administered 2016-02-28: 2 g via INTRAVENOUS
  Filled 2016-02-28: qty 50

## 2016-02-28 MED ORDER — IBUPROFEN 800 MG PO TABS: 800.0000 mg | ORAL_TABLET | Freq: Three times a day (TID) | ORAL | 0 refills | Status: DC | PRN

## 2016-02-28 MED ORDER — ONDANSETRON HCL 4 MG/2ML IJ SOLN
INTRAMUSCULAR | Status: DC | PRN
  Administered 2016-02-28: 4 mg via INTRAVENOUS

## 2016-02-28 MED ORDER — FENTANYL CITRATE (PF) 100 MCG/2ML IJ SOLN
25.0000 ug | INTRAMUSCULAR | Status: DC | PRN
  Administered 2016-02-28: 50 ug via INTRAVENOUS
  Administered 2016-02-28 (×2): 25 ug via INTRAVENOUS

## 2016-02-28 MED ORDER — CHLORHEXIDINE GLUCONATE CLOTH 2 % EX PADS: 6.0000 | MEDICATED_PAD | Freq: Once | CUTANEOUS | Status: DC

## 2016-02-28 MED ORDER — MIDAZOLAM HCL 5 MG/5ML IJ SOLN
INTRAMUSCULAR | Status: DC | PRN
  Administered 2016-02-28: 2 mg via INTRAVENOUS

## 2016-02-28 MED ORDER — LIDOCAINE 2% (20 MG/ML) 5 ML SYRINGE
INTRAMUSCULAR | Status: DC | PRN
  Administered 2016-02-28: 60 mg via INTRAVENOUS

## 2016-02-28 MED ORDER — CELECOXIB 200 MG PO CAPS
400.0000 mg | ORAL_CAPSULE | ORAL | Status: AC
  Administered 2016-02-28: 400 mg via ORAL
  Filled 2016-02-28: qty 2

## 2016-02-28 MED ORDER — SODIUM CHLORIDE 0.9 % IJ SOLN
INTRAMUSCULAR | Status: AC
  Filled 2016-02-28: qty 10

## 2016-02-28 MED ORDER — CEFAZOLIN SODIUM 1 G IJ SOLR
INTRAMUSCULAR | Status: AC
  Filled 2016-02-28: qty 20

## 2016-02-28 MED ORDER — SUGAMMADEX SODIUM 200 MG/2ML IV SOLN
INTRAVENOUS | Status: DC | PRN
  Administered 2016-02-28: 200 mg via INTRAVENOUS

## 2016-02-28 MED ORDER — SUGAMMADEX SODIUM 200 MG/2ML IV SOLN
INTRAVENOUS | Status: AC
  Filled 2016-02-28: qty 2

## 2016-02-28 MED ORDER — FENTANYL CITRATE (PF) 100 MCG/2ML IJ SOLN
INTRAMUSCULAR | Status: DC | PRN
  Administered 2016-02-28 (×4): 50 ug via INTRAVENOUS

## 2016-02-28 MED ORDER — GABAPENTIN 300 MG PO CAPS
300.0000 mg | ORAL_CAPSULE | ORAL | Status: AC
  Administered 2016-02-28: 300 mg via ORAL
  Filled 2016-02-28: qty 1

## 2016-02-28 MED ORDER — PROPOFOL 10 MG/ML IV BOLUS
INTRAVENOUS | Status: DC | PRN
  Administered 2016-02-28: 170 mg via INTRAVENOUS

## 2016-02-28 MED ORDER — BUPIVACAINE-EPINEPHRINE 0.25% -1:200000 IJ SOLN
INTRAMUSCULAR | Status: DC | PRN
  Administered 2016-02-28: 18 mL

## 2016-02-28 MED ORDER — ONDANSETRON HCL 4 MG/2ML IJ SOLN
INTRAMUSCULAR | Status: AC
  Filled 2016-02-28: qty 8

## 2016-02-28 MED ORDER — SODIUM CHLORIDE 0.9 % IR SOLN
Status: DC | PRN
  Administered 2016-02-28: 1000 mL

## 2016-02-28 MED ORDER — ROCURONIUM BROMIDE 10 MG/ML (PF) SYRINGE
PREFILLED_SYRINGE | INTRAVENOUS | Status: DC | PRN
  Administered 2016-02-28: 10 mg via INTRAVENOUS
  Administered 2016-02-28: 50 mg via INTRAVENOUS

## 2016-02-28 MED ORDER — BUPIVACAINE-EPINEPHRINE (PF) 0.25% -1:200000 IJ SOLN
INTRAMUSCULAR | Status: AC
  Filled 2016-02-28: qty 30

## 2016-02-28 MED ORDER — KETOROLAC TROMETHAMINE 30 MG/ML IJ SOLN
INTRAMUSCULAR | Status: AC
  Filled 2016-02-28: qty 1

## 2016-02-28 MED ORDER — FENTANYL CITRATE (PF) 100 MCG/2ML IJ SOLN
INTRAMUSCULAR | Status: AC
  Filled 2016-02-28: qty 4

## 2016-02-28 MED ORDER — PHENYLEPHRINE 40 MCG/ML (10ML) SYRINGE FOR IV PUSH (FOR BLOOD PRESSURE SUPPORT)
PREFILLED_SYRINGE | INTRAVENOUS | Status: AC
  Filled 2016-02-28: qty 20

## 2016-02-28 MED ORDER — KETOROLAC TROMETHAMINE 30 MG/ML IJ SOLN
INTRAMUSCULAR | Status: DC | PRN
  Administered 2016-02-28: 30 mg via INTRAVENOUS

## 2016-02-28 MED ORDER — PROMETHAZINE HCL 25 MG/ML IJ SOLN: 6.2500 mg | INTRAMUSCULAR | Status: DC | PRN

## 2016-02-28 MED ORDER — ROCURONIUM BROMIDE 10 MG/ML (PF) SYRINGE
PREFILLED_SYRINGE | INTRAVENOUS | Status: AC
  Filled 2016-02-28: qty 10

## 2016-02-28 MED ORDER — SUCCINYLCHOLINE CHLORIDE 200 MG/10ML IV SOSY
PREFILLED_SYRINGE | INTRAVENOUS | Status: AC
  Filled 2016-02-28: qty 10

## 2016-02-28 MED ORDER — HYDROCODONE-ACETAMINOPHEN 5-325 MG PO TABS: 1.0000 | ORAL_TABLET | Freq: Four times a day (QID) | ORAL | 0 refills | Status: DC | PRN

## 2016-02-28 MED ORDER — ACETAMINOPHEN 500 MG PO TABS
1000.0000 mg | ORAL_TABLET | ORAL | Status: AC
  Administered 2016-02-28: 1000 mg via ORAL
  Filled 2016-02-28: qty 2

## 2016-02-28 SURGICAL SUPPLY — 34 items
CANISTER SUCTION 2500CC (MISCELLANEOUS) ×3 IMPLANT
CHLORAPREP W/TINT 26ML (MISCELLANEOUS) ×3 IMPLANT
CLIP LIGATING HEMO LOK XL GOLD (MISCELLANEOUS) ×3 IMPLANT
COVER SURGICAL LIGHT HANDLE (MISCELLANEOUS) ×3 IMPLANT
ELECT REM PT RETURN 9FT ADLT (ELECTROSURGICAL) ×3
ELECTRODE REM PT RTRN 9FT ADLT (ELECTROSURGICAL) ×1 IMPLANT
GLOVE BIOGEL PI IND STRL 6.5 (GLOVE) ×2 IMPLANT
GLOVE BIOGEL PI IND STRL 7.0 (GLOVE) ×3 IMPLANT
GLOVE BIOGEL PI INDICATOR 6.5 (GLOVE) ×4
GLOVE BIOGEL PI INDICATOR 7.0 (GLOVE) ×6
GLOVE ECLIPSE 7.5 STRL STRAW (GLOVE) ×3 IMPLANT
GLOVE SURG SS PI 6.5 STRL IVOR (GLOVE) ×9 IMPLANT
GLOVE SURG SS PI 7.0 STRL IVOR (GLOVE) ×3 IMPLANT
GOWN STRL REUS W/ TWL LRG LVL3 (GOWN DISPOSABLE) ×4 IMPLANT
GOWN STRL REUS W/TWL LRG LVL3 (GOWN DISPOSABLE) ×8
GRASPER SUT TROCAR 14GX15 (MISCELLANEOUS) ×3 IMPLANT
KIT BASIN OR (CUSTOM PROCEDURE TRAY) ×3 IMPLANT
KIT ROOM TURNOVER OR (KITS) ×3 IMPLANT
LIQUID BAND (GAUZE/BANDAGES/DRESSINGS) ×3 IMPLANT
NS IRRIG 1000ML POUR BTL (IV SOLUTION) ×3 IMPLANT
PAD ARMBOARD 7.5X6 YLW CONV (MISCELLANEOUS) ×3 IMPLANT
POUCH RETRIEVAL ECOSAC 10 (ENDOMECHANICALS) ×1 IMPLANT
POUCH RETRIEVAL ECOSAC 10MM (ENDOMECHANICALS) ×2
SCISSORS LAP 5X35 DISP (ENDOMECHANICALS) ×3 IMPLANT
SET IRRIG TUBING LAPAROSCOPIC (IRRIGATION / IRRIGATOR) ×3 IMPLANT
SLEEVE ENDOPATH XCEL 5M (ENDOMECHANICALS) ×6 IMPLANT
SPECIMEN JAR SMALL (MISCELLANEOUS) ×3 IMPLANT
SUT MNCRL AB 4-0 PS2 18 (SUTURE) ×3 IMPLANT
TOWEL OR 17X24 6PK STRL BLUE (TOWEL DISPOSABLE) ×3 IMPLANT
TOWEL OR 17X26 10 PK STRL BLUE (TOWEL DISPOSABLE) ×3 IMPLANT
TRAY LAPAROSCOPIC MC (CUSTOM PROCEDURE TRAY) ×3 IMPLANT
TROCAR XCEL 12X100 BLDLESS (ENDOMECHANICALS) ×3 IMPLANT
TROCAR XCEL NON-BLD 5MMX100MML (ENDOMECHANICALS) ×3 IMPLANT
TUBING INSUFFLATION (TUBING) ×3 IMPLANT

## 2016-02-28 NOTE — Op Note (Signed)
PATIENT:  Sarah Brock  60 y.o. female  PRE-OPERATIVE DIAGNOSIS:  acute cholecystitis w calculus  POST-OPERATIVE DIAGNOSIS:  acute cholecystitis w calculus  PROCEDURE:  Procedure(s): LAPAROSCOPIC CHOLECYSTECTOMY   SURGEON:  Surgeon(s): Arta Bruce Jadore Mcguffin, MD  ASSISTANT: none  ANESTHESIA:   local and general  Indications for procedure: Sarah Brock is a 59 y.o. female with symptoms of Abdominal pain and RUQ pain consistent with gallbladder disease, Confirmed by Ultrasound.  Description of procedure: The patient was brought into the operative suite, placed supine. Anesthesia was administered with endotracheal tube. Patient was strapped in place and foot board was secured. All pressure points were offloaded by foam padding. The patient was prepped and draped in the usual sterile fashion.  A small incision was made to the right of the umbilicus. A 27mm trocar was inserted into the peritoneal cavity with optical entry. Pneumoperitoneum was applied with high flow low pressure. 2 36mm trocars were placed in the RUQ. A 35mm trocar was placed in the subxiphoid space. All trocars sites were first anesthesized with 0.25% marcaine with epinephrine in the subcutaneous and preperitoneal layers. Next the patient was placed in reverse trendelenberg. The gallbladder was tense and inflamed. On retraction the tip of the gallbladder tore and large amount of thick bilious fluid was suctioned out allowing better retraction.  The gallbladder was retracted cephalad and lateral. The peritoneum was reflected off the infundibulum working lateral to medial. The cystic duct and cystic artery were identified and further dissection revealed a critical view. The cystic duct and cystic artery were doubly clipped and ligated.   The gallbladder was removed off the liver bed with cautery. The Gallbladder was placed in a specimen bag. The gallbladder fossa was irrigated and hemostasis was applied with cautery. The  gallbladder was removed via the 48mm trocar. The fascial defect was closed with interrupted 0 vicryl suture via laparoscopic trans-fascial suture passer. Pneumoperitoneum was removed, all trocar were removed. All incisions were closed with 4-0 monocryl subcuticular stitch. The patient woke from anesthesia and was brought to PACU in stable condition. All counts were correct  Findings: inflamed gallbladder  Specimen: gallbladder  Blood loss: Total I/O In: 1000 [I.V.:1000] Out: -  ml  Local anesthesia: 18 ml 0.25% marcaine with epinephrine  Complications: none  PLAN OF CARE: Discharge to home after PACU  PATIENT DISPOSITION:  PACU - hemodynamically stable.  Gurney Maxin, M.D. General, Bariatric, & Minimally Invasive Surgery [facility]

## 2016-02-28 NOTE — Anesthesia Postprocedure Evaluation (Signed)
Anesthesia Post Note  Patient: Sarah Brock  Procedure(s) Performed: Procedure(s) (LRB): LAPAROSCOPIC CHOLECYSTECTOMY (N/A)  Patient location during evaluation: PACU Anesthesia Type: General Level of consciousness: awake and alert Pain management: pain level controlled Vital Signs Assessment: post-procedure vital signs reviewed and stable Respiratory status: spontaneous breathing, nonlabored ventilation, respiratory function stable and patient connected to nasal cannula oxygen Cardiovascular status: blood pressure returned to baseline and stable Postop Assessment: no signs of nausea or vomiting Anesthetic complications: no    Last Vitals:  Vitals:   02/28/16 1405 02/28/16 1420  BP: 135/70 135/75  Pulse: 93 95  Resp: 18 18  Temp:      Last Pain:  Vitals:   02/28/16 1430  TempSrc:   PainSc: 5                  Catalina Gravel

## 2016-02-28 NOTE — Anesthesia Preprocedure Evaluation (Addendum)
Anesthesia Evaluation  Patient identified by MRN, date of birth, ID band Patient awake    Reviewed: Allergy & Precautions, NPO status , Patient's Chart, lab work & pertinent test results  Airway Mallampati: II  TM Distance: >3 FB Neck ROM: Full    Dental  (+) Teeth Intact, Dental Advisory Given, Partial Upper, Missing   Pulmonary asthma ,    Pulmonary exam normal breath sounds clear to auscultation       Cardiovascular hypertension, Pt. on medications Normal cardiovascular exam Rhythm:Regular Rate:Normal     Neuro/Psych Seizures - (last sz 3-14yrs ago), Well Controlled,  negative psych ROS   GI/Hepatic negative GI ROS, Neg liver ROS,   Endo/Other  diabetes, Type 2, Oral Hypoglycemic AgentsObesity   Renal/GU negative Renal ROS     Musculoskeletal  (+) Arthritis , Osteoarthritis,    Abdominal   Peds  Hematology negative hematology ROS (+)   Anesthesia Other Findings Day of surgery medications reviewed with the patient.  Reproductive/Obstetrics negative OB ROS                            Anesthesia Physical Anesthesia Plan  ASA: III  Anesthesia Plan: General   Post-op Pain Management:    Induction: Intravenous  Airway Management Planned: Oral ETT  Additional Equipment:   Intra-op Plan:   Post-operative Plan: Extubation in OR  Informed Consent: I have reviewed the patients History and Physical, chart, labs and discussed the procedure including the risks, benefits and alternatives for the proposed anesthesia with the patient or authorized representative who has indicated his/her understanding and acceptance.   Dental advisory given  Plan Discussed with: CRNA  Anesthesia Plan Comments: (Risks/benefits of general anesthesia discussed with patient including risk of damage to teeth, lips, gum, and tongue, nausea/vomiting, allergic reactions to medications, and the possibility of heart  attack, stroke and death.  All patient questions answered.  Patient wishes to proceed.)        Anesthesia Quick Evaluation

## 2016-02-28 NOTE — H&P (Signed)
Sarah Brock is an 60 y.o. female.   Chief Complaint: abdominal pain HPI: 61 yo female with 1 month of intermittent abdominal pain. She has had multiple similar bouts in the past. She was scheduled to have her gallbladder removed in the past, but cancelled due to social situation. She has nausea but not vomiting. She denies diarrhea or fevers.  Past Medical History:  Diagnosis Date  . Anemia, unspecified   . Asthma    as a child  . Diabetes mellitus    Type II  . History of blood transfusion    pre hysterectomy  . Osteoarthrosis, unspecified whether generalized or localized, unspecified site   . Other and unspecified hyperlipidemia   . Other convulsions    last one 2013  . Other malaise and fatigue   . Other specified cardiac dysrhythmias(427.89) 2006   "history of Bradycardia" .  wore a mointor nothing found  . Seizure disorder ([place])   . Unspecified essential hypertension   . Unspecified vitamin D deficiency     Past Surgical History:  Procedure Laterality Date  . ABDOMINAL HYSTERECTOMY    . BREAST LUMPECTOMY Right 1997    Family History  Problem Relation Age of Onset  . Cancer Father     Esophageal   Social History:  reports that she has never smoked. She has never used smokeless tobacco. She reports that she does not drink alcohol or use drugs.  Allergies:  Allergies  Allergen Reactions  . Simvastatin Other (See Comments)    Stomach pain, low back pain   . Shrimp [Shellfish Allergy] Other (See Comments)    UNSPECIFIED REACTION     Medications Prior to Admission  Medication Sig Dispense Refill  . cyclobenzaprine (FLEXERIL) 10 MG tablet Take 1 tablet (10 mg total) by mouth 2 (two) times daily as needed for muscle spasms. 20 tablet 0  . ferrous sulfate 325 (65 FE) MG tablet Take 1 tablet (325 mg total) by mouth 2 (two) times daily with a meal. 60 tablet 2  . glucose blood test strip Use as instructed 100 each 12  . hydrochlorothiazide (HYDRODIURIL) 25 MG tablet  Take one tablet by mouth twice daily to control blood pressure 180 tablet 3  . ibuprofen (ADVIL,MOTRIN) 800 MG tablet Take 1 tablet (800 mg total) by mouth 3 (three) times daily. 21 tablet 0  . levETIRAcetam (KEPPRA) 500 MG tablet Take one tablet by mouth every 12 hours for seizures 180 tablet 3  . lisinopril (PRINIVIL,ZESTRIL) 30 MG tablet Take one tablet by mouth once daily for blood pressure 30 tablet 6  . metFORMIN (GLUCOPHAGE) 500 MG tablet Take one tablet by mouth twice daily with meals to control blood sugar 60 tablet 5  . ondansetron (ZOFRAN ODT) 4 MG disintegrating tablet Take 1 tablet (4 mg total) by mouth every 8 (eight) hours as needed for nausea or vomiting. 30 tablet 0  . oxyCODONE-acetaminophen (PERCOCET) 5-325 MG tablet Take 1 tablet by mouth every 8 (eight) hours as needed for severe pain. 10 tablet 0    Results for orders placed or performed during the hospital encounter of 02/28/16 (from the past 48 hour(s))  CBC WITH DIFFERENTIAL     Status: Abnormal   Collection Time: 02/28/16  9:48 AM  Result Value Ref Range   WBC 7.2 4.0 - 10.5 K/uL   RBC 4.93 3.87 - 5.11 MIL/uL   Hemoglobin 12.6 12.0 - 15.0 g/dL   HCT 39.5 36.0 - 46.0 %   MCV 80.1  78.0 - 100.0 fL   MCH 25.6 (L) 26.0 - 34.0 pg   MCHC 31.9 30.0 - 36.0 g/dL   RDW 13.3 11.5 - 15.5 %   Platelets 426 (H) 150 - 400 K/uL   Neutrophils Relative % 53 %   Neutro Abs 3.9 1.7 - 7.7 K/uL   Lymphocytes Relative 36 %   Lymphs Abs 2.6 0.7 - 4.0 K/uL   Monocytes Relative 5 %   Monocytes Absolute 0.4 0.1 - 1.0 K/uL   Eosinophils Relative 5 %   Eosinophils Absolute 0.3 0.0 - 0.7 K/uL   Basophils Relative 1 %   Basophils Absolute 0.1 0.0 - 0.1 K/uL  Glucose, capillary     Status: Abnormal   Collection Time: 02/28/16  9:49 AM  Result Value Ref Range   Glucose-Capillary 184 (H) 65 - 99 mg/dL   No results found.  Review of Systems  Constitutional: Negative for chills and fever.  HENT: Negative for hearing loss.   Eyes:  Negative for blurred vision and double vision.  Respiratory: Negative for cough and hemoptysis.   Cardiovascular: Negative for chest pain and palpitations.  Gastrointestinal: Positive for abdominal pain and nausea. Negative for vomiting.  Genitourinary: Negative for dysuria and urgency.  Musculoskeletal: Negative for myalgias and neck pain.  Skin: Negative for itching and rash.  Neurological: Negative for dizziness, tingling and headaches.  Endo/Heme/Allergies: Does not bruise/bleed easily.  Psychiatric/Behavioral: Negative for depression and suicidal ideas.    Blood pressure 131/78, pulse (!) 105, temperature 98 F (36.7 C), temperature source Oral, resp. rate 20, height 5\' 2"  (1.575 m), weight 76.2 kg (168 lb), SpO2 100 %. Physical Exam  Vitals reviewed. Constitutional: She is oriented to person, place, and time. She appears well-developed and well-nourished.  HENT:  Head: Normocephalic and atraumatic.  Eyes: Conjunctivae and EOM are normal. Pupils are equal, round, and reactive to light.  Neck: Normal range of motion. Neck supple.  Cardiovascular: Normal rate and regular rhythm.   Respiratory: Effort normal and breath sounds normal.  GI: Soft. Bowel sounds are normal. She exhibits no distension. There is tenderness.  Musculoskeletal: Normal range of motion.  Neurological: She is alert and oriented to person, place, and time.  Skin: Skin is warm and dry.  Psychiatric: She has a normal mood and affect. Her behavior is normal.     Assessment/Plan 60 yo female with acute cholecysititis -lap chole  Mickeal Skinner, MD 02/28/2016, 10:31 AM

## 2016-02-28 NOTE — Anesthesia Procedure Notes (Signed)
Procedure Name: Intubation Date/Time: 02/28/2016 12:02 PM Performed by: Everlean Cherry A Pre-anesthesia Checklist: Patient identified, Emergency Drugs available, Suction available and Patient being monitored Patient Re-evaluated:Patient Re-evaluated prior to inductionOxygen Delivery Method: Circle system utilized Preoxygenation: Pre-oxygenation with 100% oxygen Intubation Type: IV induction Ventilation: Mask ventilation without difficulty Laryngoscope Size: Miller and 2 Grade View: Grade II Tube size: 7.0 mm Number of attempts: 1 Airway Equipment and Method: Stylet Placement Confirmation: ETT inserted through vocal cords under direct vision,  positive ETCO2 and breath sounds checked- equal and bilateral Secured at: 23 cm Tube secured with: Tape Dental Injury: Teeth and Oropharynx as per pre-operative assessment

## 2016-02-28 NOTE — Transfer of Care (Signed)
Immediate Anesthesia Transfer of Care Note  Patient: Sarah Brock  Procedure(s) Performed: Procedure(s): LAPAROSCOPIC CHOLECYSTECTOMY (N/A)  Patient Location: PACU  Anesthesia Type:General  Level of Consciousness: awake, alert , oriented and patient cooperative  Airway & Oxygen Therapy: Patient Spontanous Breathing and Patient connected to nasal cannula oxygen  Post-op Assessment: Report given to RN and Post -op Vital signs reviewed and stable  Post vital signs: Reviewed and stable  Last Vitals:  Vitals:   02/28/16 1010 02/28/16 1335  BP: 131/78 (!) 149/74  Pulse: (!) 105 96  Resp: 20 20  Temp: 36.7 C (P) 36.1 C    Last Pain:  Vitals:   02/28/16 1335  TempSrc:   PainSc: (P) 0-No pain         Complications: No apparent anesthesia complications

## 2016-02-29 ENCOUNTER — Encounter (HOSPITAL_COMMUNITY): Payer: Self-pay | Admitting: General Surgery

## 2016-04-27 MED FILL — metFORMIN HCL 500 MG TABS: 500 | 60 days supply | Qty: 120 | Fill #6

## 2016-05-14 MED FILL — levETIRAcetam 500 MG TABS: 500 | 90 days supply | Qty: 180 | Fill #2

## 2016-05-18 ENCOUNTER — Other Ambulatory Visit: Payer: Self-pay | Admitting: Internal Medicine

## 2016-05-18 DIAGNOSIS — I10 Essential (primary) hypertension: Secondary | ICD-10-CM

## 2016-05-21 MED FILL — LISINOPRIL 30 MG TABLET: 30 | 90 days supply | Qty: 90 | Fill #0

## 2016-08-06 ENCOUNTER — Other Ambulatory Visit: Payer: Self-pay | Admitting: Internal Medicine

## 2016-08-06 MED ORDER — LEVETIRACETAM 500 MG PO TABS: ORAL_TABLET | ORAL | 0 refills | Status: DC

## 2016-08-06 MED FILL — metFORMIN HCL 500 MG TABS: 500 | 30 days supply | Qty: 60 | Fill #0

## 2016-08-06 MED FILL — levETIRAcetam 500 MG TABS: 500 | 90 days supply | Qty: 180 | Fill #0

## 2016-08-06 NOTE — Telephone Encounter (Signed)
[place].

## 2016-08-16 ENCOUNTER — Telehealth: Payer: Self-pay

## 2016-08-16 NOTE — Telephone Encounter (Signed)
I called patient to remind her that she has a mammogram ordered. Left message asking patient to call the office to discuss need for mammogram.

## 2016-08-30 MED FILL — LISINOPRIL 30 MG TABLET: 30 | 90 days supply | Qty: 90 | Fill #1

## 2016-09-03 ENCOUNTER — Encounter: Payer: Self-pay | Admitting: Nurse Practitioner

## 2016-09-07 ENCOUNTER — Other Ambulatory Visit: Payer: Self-pay | Admitting: Internal Medicine

## 2016-09-18 ENCOUNTER — Ambulatory Visit (INDEPENDENT_AMBULATORY_CARE_PROVIDER_SITE_OTHER): Payer: 59 | Admitting: Nurse Practitioner

## 2016-09-18 ENCOUNTER — Encounter: Payer: Self-pay | Admitting: Nurse Practitioner

## 2016-09-18 VITALS — BP 136/88 | HR 92 | Temp 97.9°F | Resp 18 | Ht 62.0 in | Wt 169.6 lb

## 2016-09-18 DIAGNOSIS — D509 Iron deficiency anemia, unspecified: Secondary | ICD-10-CM | POA: Diagnosis not present

## 2016-09-18 DIAGNOSIS — Z0000 Encounter for general adult medical examination without abnormal findings: Secondary | ICD-10-CM | POA: Diagnosis not present

## 2016-09-18 DIAGNOSIS — G40909 Epilepsy, unspecified, not intractable, without status epilepticus: Secondary | ICD-10-CM

## 2016-09-18 DIAGNOSIS — E1165 Type 2 diabetes mellitus with hyperglycemia: Secondary | ICD-10-CM

## 2016-09-18 DIAGNOSIS — I10 Essential (primary) hypertension: Secondary | ICD-10-CM | POA: Diagnosis not present

## 2016-09-18 DIAGNOSIS — Z1211 Encounter for screening for malignant neoplasm of colon: Secondary | ICD-10-CM

## 2016-09-18 DIAGNOSIS — E1139 Type 2 diabetes mellitus with other diabetic ophthalmic complication: Secondary | ICD-10-CM

## 2016-09-18 DIAGNOSIS — IMO0002 Reserved for concepts with insufficient information to code with codable children: Secondary | ICD-10-CM

## 2016-09-18 MED ORDER — METFORMIN HCL 1000 MG PO TABS: ORAL_TABLET | ORAL | 0 refills | Status: DC

## 2016-09-18 MED FILL — metFORMIN HCL 1000 MG TABS: 1000 | 45 days supply | Qty: 90 | Fill #0

## 2016-09-18 NOTE — Progress Notes (Signed)
Provider: Gildardo Cranker, DO  Patient Care Team: Gildardo Cranker, DO as PCP - General (Internal Medicine)  Extended Emergency Contact Information Primary Emergency Contact: [facility] Address: [address]          [place], [place] [address] John[place]te Litter of [place] Phone: [phone] Relation: Relative Secondary Emergency Contact: Thomas,Michael Address: [address]          [place], [place] [address] [place] Phone: [phone] Mobile Phone: [phone] Relation: Son Allergies  Allergen Reactions  . Simvastatin Other (See Comments)    Stomach pain, low back pain   . Shrimp [Shellfish Allergy] Other (See Comments)    UNSPECIFIED REACTION    Code Status: FULL Goals of Care: Advanced Directive information Advanced Directives 09/18/2016  Does Patient Have a Medical Advance Directive? No  Type of Advance Directive -  Does patient want to make changes to medical advance directive? -  Copy of [place] in Chart? -  Would patient like information on creating a medical advance directive? -     Chief Complaint  Patient presents with  . Medical Management of Chronic Issues    Pt is being seen for complete physical.     HPI: Patient is a 61 y.o. female seen in today for an annual wellness exam.   Major illnesses or hospitalization in the last year - had gallbladder removed Sept 2017  Depression screen [facility] 2/9 09/18/2016 12/05/2015 11/28/2015 08/24/2015 05/19/2014  Decreased Interest 0 0 0 0 0  Down, Depressed, Hopeless 0 0 0 0 0  PHQ - 2 Score 0 0 0 0 0    Fall Risk  09/18/2016 12/05/2015 11/28/2015 08/24/2015 01/26/2015  Falls in the past year? _0      Health Maintenance  Topic Date Due  . Hepatitis C Screening  Aug 06, 1955  . PNEUMOCOCCAL POLYSACCHARIDE VACCINE (1) 09/09/1957  . OPHTHALMOLOGY EXAM  09/09/1965  . HIV Screening  09/10/1970  . COLONOSCOPY  09/09/2005  . MAMMOGRAM  05/19/2011  . INFLUENZA VACCINE   01/24/2016  . FOOT EXAM  08/23/2016  . HEMOGLOBIN A1C  08/23/2016  . PAP SMEAR  08/10/2018  . TETANUS/TDAP  06/26/2019   Diet? diabetic diet.   Visual Acuity Screening   Right eye Left eye Both eyes  Without correction: 20/50 20/40 20/40  With correction:     Comments: Pt is supposed to wear glasses but she did not have them on today.   Exercise: no routine exercise due to strenuous job.  Dentition: following with dentist routinely.  Pain: in her legs, on them a lot for work  Past Medical History:  Diagnosis Date  . Anemia, unspecified   . Asthma    as a child  . Diabetes mellitus    Type II  . History of blood transfusion    pre hysterectomy  . Osteoarthrosis, unspecified whether generalized or localized, unspecified site   . Other and unspecified hyperlipidemia   . Other convulsions    last one 2013  . Other malaise and fatigue   . Other specified cardiac dysrhythmias(427.89) 2006   "history of Bradycardia" .  wore a mointor nothing found  . Seizure disorder ([place])   . Unspecified essential hypertension   . Unspecified vitamin D deficiency     Past Surgical History:  Procedure Laterality Date  . ABDOMINAL HYSTERECTOMY    . BREAST LUMPECTOMY Right 1997  . CHOLECYSTECTOMY N/A 02/28/2016   Procedure: LAPAROSCOPIC CHOLECYSTECTOMY;  Surgeon: Lurena Joiner  Sondra Come, MD;  [facility];  Service: General;  Laterality: N/A;    Social History   Social History  . Marital status: Widowed    Spouse name: N/A  . Number of children: N/A  . Years of education: N/A   Social History Main Topics  . Smoking status: Never Smoker  . Smokeless tobacco: Never Used  . Alcohol use No     Comment: rarely- none on years  . Drug use: No  . Sexual activity: Not Currently   Other Topics Concern  . None   Social History Narrative  . None    Family History  Problem Relation Age of Onset  . Cancer Father     Esophageal    Review of Systems:  Review of Systems    Constitutional: Negative for activity change, appetite change, chills, diaphoresis, fatigue and fever.  HENT: Negative for ear pain and sore throat.   Eyes: Negative for visual disturbance.  Respiratory: Negative for cough, chest tightness and shortness of breath.   Cardiovascular: Negative for chest pain, palpitations and leg swelling.  Gastrointestinal: Positive for diarrhea (after gallbladder surgery). Negative for abdominal pain, blood in stool, constipation, nausea and vomiting.  Genitourinary: Negative for dysuria.  Musculoskeletal: Negative for arthralgias.       Leg spasms  Neurological: Positive for seizures (hx of seizures, none recently). Negative for dizziness, tremors, numbness and headaches.  Psychiatric/Behavioral: Negative for sleep disturbance. The patient is not nervous/anxious.      Allergies as of 09/18/2016      Reactions   Simvastatin Other (See Comments)   Stomach pain, low back pain    Shrimp [shellfish Allergy] Other (See Comments)   UNSPECIFIED REACTION       Medication List       Accurate as of 09/18/16  2:08 PM. Always use your most recent med list.          cyclobenzaprine 10 MG tablet Commonly known as:  FLEXERIL Take 1 tablet (10 mg total) by mouth 2 (two) times daily as needed for muscle spasms.   ferrous sulfate 325 (65 FE) MG tablet Take 1 tablet (325 mg total) by mouth 2 (two) times daily with a meal.   glucose blood test strip Use as instructed   levETIRAcetam 500 MG tablet Commonly known as:  KEPPRA Take one tablet by mouth every 12 hours for seizures   lisinopril 30 MG tablet Commonly known as:  PRINIVIL,ZESTRIL TAKE 1 TABLET BY MOUTH ONCE DAILY FOR BLOOD PRESSURE PRESSURE   metFORMIN 500 MG tablet Commonly known as:  GLUCOPHAGE TAKE ONE TABLET BY MOUTH TWICE DAILY WITH MEALS TO CONTROL BLOOD SUGAR         Physical Exam: Vitals:   09/18/16 1403  BP: 136/88  Pulse: 92  Resp: 18  Temp: 97.9 F (36.6 C)  TempSrc: Oral   SpO2: 97%  Weight: 169 lb 9.6 oz (76.9 kg)  Height: '5\' 2"'$  (1.575 m)   Body mass index is 31.02 kg/m. Physical Exam  Constitutional: She is oriented to person, place, and time. She appears well-developed and well-nourished.  HENT:  Head: Normocephalic and atraumatic.  Right Ear: External ear normal.  Left Ear: External ear normal.  Nose: Nose normal.  Mouth/Throat: Oropharynx is clear and moist. No oropharyngeal exudate.  Eyes: Conjunctivae and EOM are normal. Pupils are equal, round, and reactive to light. No scleral icterus.  Neck: Normal range of motion. Neck supple. No thyromegaly present.  Cardiovascular: Normal rate, regular rhythm,  normal heart sounds and intact distal pulses.   No LE edema b/l. no calf TTP.   Pulmonary/Chest: Effort normal and breath sounds normal. No respiratory distress. She has no wheezes. She has no rales.  Abdominal: Soft. Bowel sounds are normal. She exhibits no distension and no mass. There is no hepatomegaly. There is no tenderness. There is no rebound and no guarding.  Musculoskeletal: She exhibits no edema or tenderness.  Lymphadenopathy:    She has no cervical adenopathy.  Neurological: She is alert and oriented to person, place, and time. She has normal reflexes.  Skin: Skin is warm and dry. No rash noted.  Psychiatric: She has a normal mood and affect. Her behavior is normal. Judgment and thought content normal.    Labs reviewed: Basic Metabolic Panel:  Recent Labs  02/17/16 1523  NA 136  K 3.3*  CL 99*  CO2 26  GLUCOSE 167*  BUN 11  CREATININE 0.86  CALCIUM 9.4   Liver Function Tests:  Recent Labs  02/17/16 1523  AST 18  ALT 18  ALKPHOS 64  BILITOT 0.8  PROT 7.7  ALBUMIN 4.0    Recent Labs  02/17/16 1523  LIPASE 24   No results for input(s): AMMONIA in the last 8760 hours. CBC:  Recent Labs  02/17/16 1523 02/28/16 0948  WBC 7.4 7.2  NEUTROABS  --  3.9  HGB 12.1 12.6  HCT 38.2 39.5  MCV 79.9 80.1  PLT 284  426*   Lipid Panel: No results for input(s): CHOL, HDL, LDLCALC, TRIG, CHOLHDL, LDLDIRECT in the last 8760 hours. Lab Results  Component Value Date   HGBA1C 9.5 (H) 02/24/2016    Procedures: No results found.  Assessment/Plan 1. Well adult exam Doing well. Has not had time to get mammogram or go to ophthalmologist due to being out of work for gallbladder surgery. Has had a hysterectomy. Has been followed by the dentist regularly.   The patient was counseled regarding the appropriate use of alcohol, regular self-examination of the breasts on a monthly basis, prevention of dental and periodontal disease, diet, regular sustained exercise for at least 30 minutes 5 times per week,  smoking cessation, tobacco use,  and recommended schedule for GI hemoccult testing, colonoscopy, cholesterol, thyroid and diabetes screening. - Lipid panel; Future  2. Essential hypertension Blood pressure stable today; cont lisinopril daily and dietary modifications.   3. 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  - Ambulatory referral to Ophthalmology - CMP with eGFR; Future - Hemoglobin A1c; Future  4. Iron deficiency anemia, unspecified iron deficiency anemia type - conts on iron twice daily - CBC with Differential/Platelet; Future  5. Seizure disorder ([place]) No recent seizures, conts on keppra  6. Screening for colon cancer -completed form for cologuard.   7. Diarrhea -to increase fiber, may use benefiber 1-2 times daily   Kyson Kupper K. Harle Battiest  [facility] [phone] 8 am - 5 pm) [phone] (after hours)

## 2016-09-18 NOTE — Patient Instructions (Addendum)
Please schedule fasting blood work for next week. -- next Friday 09/28/2016-- also you can get pneumonia vaccine this day too (61)  To use benefiber 1-2 times daily Increase metformin to 1000 mg in am and 500 in pm for 1 week then increase to 1000 mg twice daily   Make appt for eye exam Make appt for mammogram 30 mins of cardiovascular activity 5 days a week  Health Maintenance, Female Adopting a healthy lifestyle and getting preventive care can go a long way to promote health and wellness. Talk with your health care provider about what schedule of regular examinations is right for you. This is a good chance for you to check in with your provider about disease prevention and staying healthy. In between checkups, there are plenty of things you can do on your own. Experts have done a lot of research about which lifestyle changes and preventive measures are most likely to keep you healthy. Ask your health care provider for more information. Weight and diet Eat a healthy diet  Be sure to include plenty of vegetables, fruits, low-fat dairy products, and lean protein.  Do not eat a lot of foods high in solid fats, added sugars, or salt.  Get regular exercise. This is one of the most important things you can do for your health.  Most adults should exercise for at least 150 minutes each week. The exercise should increase your heart rate and make you sweat (moderate-intensity exercise).  Most adults should also do strengthening exercises at least twice a week. This is in addition to the moderate-intensity exercise. Maintain a healthy weight  Body mass index (BMI) is a measurement that can be used to identify possible weight problems. It estimates body fat based on height and weight. Your health care provider can help determine your BMI and help you achieve or maintain a healthy weight.  For females 61 years of age and older:  A BMI below 18.5 is considered underweight.  A BMI of 18.5 to 24.9 is  normal.  A BMI of 25 to 29.9 is considered overweight.  A BMI of 30 and above is considered obese. Watch levels of cholesterol and blood lipids  You should start having your blood tested for lipids and cholesterol at 61 years of age, then have this test every 5 years.  You may need to have your cholesterol levels checked more often if:  Your lipid or cholesterol levels are high.  You are older than 61 years of age.  You are at high risk for heart disease. Cancer screening Lung Cancer  Lung cancer screening is recommended for adults 61-65 years old who are at high risk for lung cancer because of a history of smoking.  A yearly low-dose CT scan of the lungs is recommended for people who:  Currently smoke.  Have quit within the past 15 years.  Have at least a 30-pack-year history of smoking. A pack year is smoking an average of one pack of cigarettes a day for 1 year.  Yearly screening should continue until it has been 15 years since you quit.  Yearly screening should stop if you develop a health problem that would prevent you from having lung cancer treatment. Breast Cancer  Practice breast self-awareness. This means understanding how your breasts normally appear and feel.  It also means doing regular breast self-exams. Let your health care provider know about any changes, no matter how small.  If you are in your 20s or 30s, you should have  a clinical breast exam (CBE) by a health care provider every 1-3 years as part of a regular health exam.  If you are 42 or older, have a CBE every year. Also consider having a breast X-ray (mammogram) every year.  If you have a family history of breast cancer, talk to your health care provider about genetic screening.  If you are at high risk for breast cancer, talk to your health care provider about having an MRI and a mammogram every year.  Breast cancer gene (BRCA) assessment is recommended for women who have family members with  BRCA-related cancers. BRCA-related cancers include:  Breast.  Ovarian.  Tubal.  Peritoneal cancers.  Results of the assessment will determine the need for genetic counseling and BRCA1 and BRCA2 testing. Cervical Cancer  Your health care provider may recommend that you be screened regularly for cancer of the pelvic organs (ovaries, uterus, and vagina). This screening involves a pelvic examination, including checking for microscopic changes to the surface of your cervix (Pap test). You may be encouraged to have this screening done every 3 years, beginning at age 61.  For women ages 61-65, health care providers may recommend pelvic exams and Pap testing every 3 years, or they may recommend the Pap and pelvic exam, combined with testing for human papilloma virus (HPV), every 5 years. Some types of HPV increase your risk of cervical cancer. Testing for HPV may also be done on women of any age with unclear Pap test results.  Other health care providers may not recommend any screening for nonpregnant women who are considered low risk for pelvic cancer and who do not have symptoms. Ask your health care provider if a screening pelvic exam is right for you.  If you have had past treatment for cervical cancer or a condition that could lead to cancer, you need Pap tests and screening for cancer for at least 20 years after your treatment. If Pap tests have been discontinued, your risk factors (such as having a new sexual partner) need to be reassessed to determine if screening should resume. Some women have medical problems that increase the chance of getting cervical cancer. In these cases, your health care provider may recommend more frequent screening and Pap tests. Colorectal Cancer  This type of cancer can be detected and often prevented.  Routine colorectal cancer screening usually begins at 61 years of age and continues through 61 years of age.  Your health care provider may recommend screening at  an earlier age if you have risk factors for colon cancer.  Your health care provider may also recommend using home test kits to check for hidden blood in the stool.  A small camera at the end of a tube can be used to examine your colon directly (sigmoidoscopy or colonoscopy). This is done to check for the earliest forms of colorectal cancer.  Routine screening usually begins at age 53.  Direct examination of the colon should be repeated every 5-10 years through 61 years of age. However, you may need to be screened more often if early forms of precancerous polyps or small growths are found. Skin Cancer  Check your skin from head to toe regularly.  Tell your health care provider about any new moles or changes in moles, especially if there is a change in a mole's shape or color.  Also tell your health care provider if you have a mole that is larger than the size of a pencil eraser.  Always use sunscreen. Apply  sunscreen liberally and repeatedly throughout the day.  Protect yourself by wearing long sleeves, pants, a wide-brimmed hat, and sunglasses whenever you are outside. Heart disease, diabetes, and high blood pressure  High blood pressure causes heart disease and increases the risk of stroke. High blood pressure is more likely to develop in:  People who have blood pressure in the high end of the normal range (130-139/85-89 mm Hg).  People who are overweight or obese.  People who are African American.  If you are 9-25 years of age, have your blood pressure checked every 3-5 years. If you are 75 years of age or older, have your blood pressure checked every year. You should have your blood pressure measured twice-once when you are at a hospital or clinic, and once when you are not at a hospital or clinic. Record the average of the two measurements. To check your blood pressure when you are not at a hospital or clinic, you can use:  An automated blood pressure machine at a pharmacy.  A  home blood pressure monitor.  If you are between 44 years and 36 years old, ask your health care provider if you should take aspirin to prevent strokes.  Have regular diabetes screenings. This involves taking a blood sample to check your fasting blood sugar level.  If you are at a normal weight and have a low risk for diabetes, have this test once every three years after 61 years of age.  If you are overweight and have a high risk for diabetes, consider being tested at a younger age or more often. Preventing infection Hepatitis B  If you have a higher risk for hepatitis B, you should be screened for this virus. You are considered at high risk for hepatitis B if:  You were born in a country where hepatitis B is common. Ask your health care provider which countries are considered high risk.  Your parents were born in a high-risk country, and you have not been immunized against hepatitis B (hepatitis B vaccine).  You have HIV or AIDS.  You use needles to inject street drugs.  You live with someone who has hepatitis B.  You have had sex with someone who has hepatitis B.  You get hemodialysis treatment.  You take certain medicines for conditions, including cancer, organ transplantation, and autoimmune conditions. Hepatitis C  Blood testing is recommended for:  Everyone born from [birth year] through 1965.  Anyone with known risk factors for hepatitis C. Sexually transmitted infections (STIs)  You should be screened for sexually transmitted infections (STIs) including gonorrhea and chlamydia if:  You are sexually active and are younger than 61 years of age.  You are older than 61 years of age and your health care provider tells you that you are at risk for this type of infection.  Your sexual activity has changed since you were last screened and you are at an increased risk for chlamydia or gonorrhea. Ask your health care provider if you are at risk.  If you do not have HIV, but are  at risk, it may be recommended that you take a prescription medicine daily to prevent HIV infection. This is called pre-exposure prophylaxis (PrEP). You are considered at risk if:  You are sexually active and do not regularly use condoms or know the HIV status of your partner(s).  You take drugs by injection.  You are sexually active with a partner who has HIV. Talk with your health care provider about whether you are  at high risk of being infected with HIV. If you choose to begin PrEP, you should first be tested for HIV. You should then be tested every 3 months for as long as you are taking PrEP. Pregnancy  If you are premenopausal and you may become pregnant, ask your health care provider about preconception counseling.  If you may become pregnant, take 400 to 800 micrograms (mcg) of folic acid every day.  If you want to prevent pregnancy, talk to your health care provider about birth control (contraception). Osteoporosis and menopause  Osteoporosis is a disease in which the bones lose minerals and strength with aging. This can result in serious bone fractures. Your risk for osteoporosis can be identified using a bone density scan.  If you are 44 years of age or older, or if you are at risk for osteoporosis and fractures, ask your health care provider if you should be screened.  Ask your health care provider whether you should take a calcium or vitamin D supplement to lower your risk for osteoporosis.  Menopause may have certain physical symptoms and risks.  Hormone replacement therapy may reduce some of these symptoms and risks. Talk to your health care provider about whether hormone replacement therapy is right for you. Follow these instructions at home:  Schedule regular health, dental, and eye exams.  Stay current with your immunizations.  Do not use any tobacco products including cigarettes, chewing tobacco, or electronic cigarettes.  If you are pregnant, do not drink  alcohol.  If you are breastfeeding, limit how much and how often you drink alcohol.  Limit alcohol intake to no more than 1 drink per day for nonpregnant women. One drink equals 12 ounces of beer, 5 ounces of wine, or 1 ounces of hard liquor.  Do not use street drugs.  Do not share needles.  Ask your health care provider for help if you need support or information about quitting drugs.  Tell your health care provider if you often feel depressed.  Tell your health care provider if you have ever been abused or do not feel safe at home. This information is not intended to replace advice given to you by your health care provider. Make sure you discuss any questions you have with your health care provider. Document Released: 12/25/2010 Document Revised: 11/17/2015 Document Reviewed: 03/15/2015 [employer] Interactive Patient Education  2017 [employer].

## 2016-09-26 ENCOUNTER — Other Ambulatory Visit: Payer: 59

## 2016-09-26 ENCOUNTER — Ambulatory Visit (INDEPENDENT_AMBULATORY_CARE_PROVIDER_SITE_OTHER): Payer: 59

## 2016-09-26 DIAGNOSIS — E1139 Type 2 diabetes mellitus with other diabetic ophthalmic complication: Secondary | ICD-10-CM | POA: Diagnosis not present

## 2016-09-26 DIAGNOSIS — Z0000 Encounter for general adult medical examination without abnormal findings: Secondary | ICD-10-CM

## 2016-09-26 DIAGNOSIS — Z23 Encounter for immunization: Secondary | ICD-10-CM

## 2016-09-26 DIAGNOSIS — IMO0002 Reserved for concepts with insufficient information to code with codable children: Secondary | ICD-10-CM

## 2016-09-26 DIAGNOSIS — D509 Iron deficiency anemia, unspecified: Secondary | ICD-10-CM | POA: Diagnosis not present

## 2016-09-26 DIAGNOSIS — E1165 Type 2 diabetes mellitus with hyperglycemia: Secondary | ICD-10-CM

## 2016-09-26 LAB — CBC WITH DIFFERENTIAL/PLATELET
BASOS PCT: 1 %
Basophils Absolute: 62 cells/uL (ref 0–200)
EOS PCT: 7 %
Eosinophils Absolute: 434 cells/uL (ref 15–500)
HCT: 38.4 % (ref 35.0–45.0)
HEMOGLOBIN: 12.2 g/dL (ref 11.7–15.5)
LYMPHS ABS: 2046 {cells}/uL (ref 850–3900)
Lymphocytes Relative: 33 %
MCH: 25.2 pg — ABNORMAL LOW (ref 27.0–33.0)
MCHC: 31.8 g/dL — AB (ref 32.0–36.0)
MCV: 79.2 fL — ABNORMAL LOW (ref 80.0–100.0)
MONO ABS: 310 {cells}/uL (ref 200–950)
MPV: 10.2 fL (ref 7.5–12.5)
Monocytes Relative: 5 %
NEUTROS ABS: 3348 {cells}/uL (ref 1500–7800)
NEUTROS PCT: 54 %
Platelets: 297 10*3/uL (ref 140–400)
RBC: 4.85 MIL/uL (ref 3.80–5.10)
RDW: 14.9 % (ref 11.0–15.0)
WBC: 6.2 10*3/uL (ref 3.8–10.8)

## 2016-09-26 LAB — COMPLETE METABOLIC PANEL WITH GFR
ALBUMIN: 4.1 g/dL (ref 3.6–5.1)
ALK PHOS: 78 U/L (ref 33–130)
ALT: 14 U/L (ref 6–29)
AST: 14 U/L (ref 10–35)
BUN: 13 mg/dL (ref 7–25)
CHLORIDE: 106 mmol/L (ref 98–110)
CO2: 22 mmol/L (ref 20–31)
CREATININE: 0.92 mg/dL (ref 0.50–0.99)
Calcium: 9.1 mg/dL (ref 8.6–10.4)
GFR, Est African American: 78 mL/min (ref >=60)
GFR, Est Non African American: 67 mL/min (ref >=60)
GLUCOSE: 138 mg/dL — AB (ref 65–99)
POTASSIUM: 4.5 mmol/L (ref 3.5–5.3)
SODIUM: 138 mmol/L (ref 135–146)
Total Bilirubin: 0.3 mg/dL (ref 0.2–1.2)
Total Protein: 7.1 g/dL (ref 6.1–8.1)

## 2016-09-26 LAB — LIPID PANEL
CHOL/HDL RATIO: 3.7 ratio (ref <5.0)
Cholesterol: 199 mg/dL (ref <200)
HDL: 54 mg/dL (ref >50)
LDL Cholesterol: 120 mg/dL — ABNORMAL HIGH (ref <100)
Triglycerides: 126 mg/dL (ref <150)
VLDL: 25 mg/dL (ref <30)

## 2016-09-27 ENCOUNTER — Ambulatory Visit: Payer: Self-pay

## 2016-09-27 ENCOUNTER — Other Ambulatory Visit: Payer: Self-pay

## 2016-09-27 LAB — HEMOGLOBIN A1C
HEMOGLOBIN A1C: 7.8 % — AB (ref <5.7)
MEAN PLASMA GLUCOSE: 177 mg/dL

## 2016-10-01 ENCOUNTER — Other Ambulatory Visit: Payer: Self-pay

## 2016-10-01 DIAGNOSIS — E119 Type 2 diabetes mellitus without complications: Secondary | ICD-10-CM

## 2016-10-01 DIAGNOSIS — D509 Iron deficiency anemia, unspecified: Secondary | ICD-10-CM

## 2016-10-01 DIAGNOSIS — E782 Mixed hyperlipidemia: Secondary | ICD-10-CM

## 2016-10-02 NOTE — Progress Notes (Signed)
DATE OF VISIT:  10/02/2016      CHIEF COMPLAINT: Mobitz II AV block status post pacemaker, atrial fibrillation      I had the pleasure of seeing [name] in Cardiac Electrophysiology today for further evaluation of symptomatic bradycardia, status post dual-chamber pacemaker.    [name] is a 61 y.o. woman with history of thyroid nodule, IBS, and hysterectomy who presented with exertional fatigue and mild shortness of breath, as well as occasional palpitations that awakened her in the middle of the night. The exertional symptoms, which started on 08/23/2015 and persisted thereafter, were also sometimes associated with lightheadedness.  She had previously been very active, feeding and riding her horse nearly every day.  Evaluation was notable for a baseline right bundle branch block, and periods of symptomatic 2:1 AV block while reaching peak exercise on a treadmill stress test (see details below).  Holter monitoring also reportedly showed blocked PAC's with resultant bradycardia, though to my review blocked atrial activity was not typically premature (see details below).    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  A dual-chamber device was implanted without complication on 10/31/2015.  Thereafter, she noticed significant improvement in her symptoms, but had one episode very similar to her pre-pacemaker symptoms on 11/18/2015 and several milder episodes afterwards aborted by resting.  Device function was normal -- at follow-up 11/2015, she paced 5% in the RA and 1% in the RV.  She underwent breast augmentation surgery on 04/18/2016.      More recently, [name] has had some new arrhythmia issues.  She reports having a "wide complex tachycardia" for 5 minutes (up to 115 bpm) after a colonoscopy 06/2016.  She has strips of the event but did not bring them today.  She also  started having episodes of palpitations in February and specifically recalls symptomatic episodes on 2/12 and 2/24 -- the latter correlated with a >1 hour episode of atrial fibrillation recorded by the device.  She was started on aspirin 81 mg daily and recommended to take metoprolol 12.5 mg twice daily, though she opted not to start the metoprolol until after her visit today.  She notes that the palpitations have gotten better more recently and wonders if increased anxiety at work potentially played a role.  She also says that rare alcohol seems to be a trigger, as well as physical exertion.    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 - Oral    Class: Historical Med    estradiol (ESTRACE) 1 mg tablet        Sig - Route: Take 1 mg by mouth Daily. - Oral    Class: Historical Med    estradiol (  ESTRING) 2 mg (7.5 mcg /24 hour) vaginal ring        Sig - Route: Place vaginally every 3 (three) months. Use as instructed - Vaginal    Class: Historical Med          ALLERGIES     Allergies as of 10/02/2016  Review Complete On: 10/02/2016 By: Mike Craze, MD      Severity Noted Reaction Type Reactions    Sulfamethoxazole-trimethoprim Not Specified 10/18/2015    Itching    Insomnia          SOCIAL HISTORY     She reports that she has never smoked. She has never used smokeless tobacco. She reports that she drinks about 0.6 oz of alcohol per week . She reports that she does not use drugs.    She is divorced.  She has 3 children.  She lives in [place], [place].  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    She has 2 horses (a Film/video editor and a [place], ages 50 and 45).  She also wakeboards in the summer.    FAMILY HISTORY     Her father underwent CABG in his late 36's and has cardiomyopathy.  He was a heavy smoker.  He is alive at 50.    Her mother had a heart attack around age 47.  She died after complications from a head injury in June 2016 after being knocked down by a horse.  Her grandfather had colon cancer.    REVIEW OF SYSTEMS     Constitutional:   No recent change in appetite.  No fevers, chills, or diaphoresis.  No unexpected weight change.   Skin:  No rashes.   Eyes:  No swelling, pain, or redness.  No photophobia or visual disturbance.  + reading glasses.  + fuzzy vision sometimes (over the past several years).  HENT:  No neck pain or stiffness.  No hearing loss or tinnitus.  No nosebleeds.   Respiratory:  No shortness of breath at rest.    Cardiovascular:  As detailed above.  Gastrointestinal:  + IBS with cramping and gas.  No blood in the stool.  + polyps on colonoscopy in 2015.  Genitourinary:  No difficulty urinating, dysuria, or hematuria.   Neurological:  No vertigo or syncope.  + occasional headaches.  No facial asymmetry or speech difficulty.  + occasional tingling left hand.  No focal weakness.  No seizures.  Hematological:  + easy bruising on backs of hands since 2012.  Musculoskeletal:  No arthralgias or joint swelling.      Psychiatric:  No change in concentration.  No confusion.  No agitation or anxiety.     PHYSICAL EXAMINATION     Blood pressure 144/76, pulse 61, temperature 36.9 C (98.4 F), temperature source Oral, resp. rate 15, height 162.6 cm ( ), weight 58.6 kg (129 lb 3.2 oz), SpO2 100 %.    General: Well-appearing Caucasian female in no apparent distress.  Eyes: Conjunctivae and lids normal.  Pupils equal, round, and reactive to light.  ENMT:  Ears and nose externally normal.  Hearing intact.  Oropharynx  clear.   Neck:  Small mass on thyroid, right side.    Respiratory:  Normal respiratory effort.  Lungs clear to auscultation bilaterally, without rales, rhonchi, or wheezes.    Cardiovascular:  PMI not-displaced.  Regular rate and rhythm, with no murmurs.  No lower extremity edema bilaterally.  Carotid upstrokes normal, without bruits.  Distal pulses intact bilaterally.  Gastrointestinal:  Soft, non-tender, and non-distended.   Musculoskeletal:  Normal gait and station.  Digits and nails without clubbing, cyanosis, petechiae, or nodes.   Skin:  Left pectoral implant site well-healed, with no evidence of impending erosion, migration, or infection.  No rashes or ecchymoses.  Neurologic:  Strength grossly intact. Sensation grossly intact.  Psychiatric:  Normal judgement and insight.  Normal mood and affect.  Oriented to person, place, and time.    DATA     I personally reviewed a 12-lead EKG performed in the office today, 10/02/2016, which showed atrial pacing at 61 bpm with intact AV conduction and right bundle branch block.    Her Medtronic dual-chamber pacemaker was interrogated today (see separate full report in [facility]).  She paces 8.4% in the RA and 3.4% in the RV.  Atrial fibrillation burden is <0.1%.  The longest episode was 1 hour 8 minutes and correlated with her symptoms.  A second episode on 2/25 appears more consistent with atrial tachycardia with 2:1 AV block (ACL 420).  There have been 2 episodes of VT, the longest lasting 4 seconds.    I reviewed a 12-lead EKG performed 11/29/2015, which showed normal sinus rhythm with right bundle branch block at 64 bpm.    I reviewed a 12-lead EKG performed 10/18/2015, which showed sinus bradycardia at 57 bpm with right bundle branch block.    I reviewed a 12-lead EKG from 08/29/2015, which showed sinus bradycardia at 57 bpm with right bundle branch block.    I reviewed the report and tracings from a 24-hour Holter monitor 08/31/2015 [facility]):  - Average HR 64.   Minimum 38.  Maximum 97.  Predominant rhythm sinus.  - Single PVC's occasional; no runs.  - Frequent blocked PACs with resultant bradycardia.  No SVT or atrial fibrillation.    - Occasional shortness of breath during periods of bradycardia with blocked PACs.  - On my review of tracings, several strips appear more consistent with sinus rhythm with 2:1 block than with blocked PAC's based on timing (though P-wave morphology is subtly different with blocked beats).  On tracings with both 2:1 and 1:1 conduction, the P-wave CL remains constant and ventricular rate exactly 1/2 during 2:1 block.  When transitioning from 1:1 to 2:1 block, the first blocked P-wave comes slightly early, but possibly due to increase in sinus rate.  - Less frequently (particularly 2:26am), bradycardia appears due to blocked PAC's in bigeminy.  - One period of 3:2 conduction (9:28am) without clear PR prolongation    I reviewed the reports of the following tests:   Exercise stress echocardiogram 09/23/2015 [facility]):  - Resting EKG sinus rhythm with right bundle branch block   - Resting echocardiogram: Normal LV size, wall thickness, and function, EF 60-65%.  Atria normal size.  RV normal.  Mild anterior leaflet MVP and mild MR.  No other significant valvular pathology.  No pericardial effusion.  - Walked 8 min 11 sec on [place], with peak HR 118 bpm early but development of 2:1 AV block at peak exercise with HR in the 60s and associated shortness of breath (2:1 block versus atrial bigeminy with block).  Returned to 1:1 conduction in recovery.  Episodes of supraventricular bigeminy noted.  - I reviewed the tracings:  - At 2:24, there is a period of 3:2 conduction with constant P-P interval and no clear PR prolongation, that appears to persist through 5:50  - HR then drops from 89  to 74 but P-waves difficult to see  - At 6:50 there appears to be 2:1 block with occasional outflow tract PVC's  - At 7:50 there is suspicion for 2:1  conduction with blocked P-waves in prior T-waves.  - At 15 seconds into recovery there are periods with 1:1 conduction at double the rate of final exercise EKGs (5:4 and 6:5 conduction, but with little to no change in PR interval and R-R interval around blocked P-wave exactly double that of conducted intervals).  This apparent Mobitz type II second degree AV block becomes more apparent as the rate continues to slow in recovery.  - By 2 minutes into recovery there is clear 2:1 AV block that persists to 3 minutes, though with one episode of 3:2 block that appears slightly more consistent with Wenckebach based on the PR intervals and length of pause  - Stress echocardiogram:  Appropriate increase in contractility of all segments; no evidence of ischemia.     Chest CTA 09/22/2015:  - Negative for PE or other explanation for shortness of breath.  - Few liver lesions, probably cysts.       PFTs 09/08/2015:  - FVC, FEV1, FEV1/FVC ratio, and FEF25-75% within normal limits  - TLC within normal limits  - FRC and RV increased  - With bronchodilators, there is significant response.  - Diffusing capacity high.  - Conclusions: Increased diffusing capacity consistent with left heart failure or shunting.  Although flow rates are within normal limits, the overinflation and response to bronchodilators are characteristic of reactive airways ("minimal")    I reviewed laboratory evaluation from [facility] dated 08/29/2015:  Na 136, K 4.1, Cl 100, CO2 31, BUN 13, Cr 0.6, Glu 86  LFTs normal  WBC 4.1, Hb 13.9, Plt 145    ASSESSMENT AND RECOMMENDATIONS     In summary, [name] is a 61 y.o. woman with exertional fatigue and shortness of breath, with evidence of Mobitz type II second degree AV block and periods of 2:1 block on stress testing and Holter monitoring, now status post dual-chamber pacemaker implant on 10/31/2015.  She more recently had a >1 hour long episode of symptomatic atrial fibrillation.      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  Her ventricular pacing burden is low overall.    - She will continue routine pacemaker checks with Dr. Denton Brick in [place].    2) Atrial fibrillation:  - She had a prior history of relatively infrequent palpitations, but no arrhythmia episodes recorded by the device until a recent episode of symptomatic atrial fibrillation.  - I discussed the pathophysiology of atrial fibrillation and multiple management options with [name].   - Given her associated symptoms, I would recommend that efforts be directed to maintaining sinus rhythm.   - Options include: a) AV nodal blocking agents such as beta blocker or calcium channel blocker; b) addition of antiarrhythmic medication; or c) ablation of atrial fibrillation (and perhaps atrial tachycardia if a consistent trigger).      - At this point, given the low overall burden and probable PAC and atrial tachycardia triggers, a trial of low-dose beta blocker therapy would be very reasonable.  Should she have uncomfortable side effects with the medication, or increasing arrhythmia burden, we will re-visit more aggressive  rhythm-control options such as antiarrhythmic drugs or ablation.  - She will start metoprolol 12.5 mg twice daily as previously prescribed by Dr. Monna Fam.  - We will plan for remote device follow-up in the next several months to re-assess arrhythmia burden.    3) Stroke prevention:  - Her CHA2DS2-VASc score is 1, for gender only.  This corresponds to a yearly stroke risk of 1.5% with no therapy, 1.2% with aspirin alone, and 0.3-0.5% on therapeutic anticoagulation.  - Given the low burden, I think aspirin alone is appropriate for now.  However, I did advise her that my general preference is a more aggressive approach to  anticoagulation given the safety of the newer medications and the potentially devastating effects of stroke -- should she have significantly increasing burden despite metoprolol therapy, we may re-visit the potential risks and benefits of NOAC therapy.      It is a pleasure participating in [name]'s care.  Please do not hesitate to contact me with any questions or concerns.    Alric Seton. Rachelle Hora, MD, [facility], [facility]  Associate Professor of Clinical Medicine  Division of Cardiology  Cardiac Electrophysiology

## 2016-10-02 NOTE — Patient Instructions (Signed)
1.  I recommend you try the metoprolol 12.5 mg twice daily.    2.  Continue aspirin    3.  Return in 6 months to re-assess symptoms and arrhythmia burden.

## 2016-10-02 NOTE — Progress Notes (Signed)
See device clinic note under "Procedures" tab

## 2016-10-03 LAB — ECG 12-LEAD
Atrial Rate: 61 {beats}/min
Calculated P Axis: 71 degrees
Calculated R Axis: 88 degrees
Calculated T Axis: 44 degrees
P-R Interval: 156 ms
QRS Duration: 122 ms
QT Interval: 442 ms
QTcb: 444 ms
Ventricular Rate: 61 {beats}/min

## 2016-10-04 ENCOUNTER — Encounter: Payer: Self-pay | Admitting: Nurse Practitioner

## 2016-10-22 MED FILL — HYDROCODON-APAP 7.5-325: 7.5-325 | 2 days supply | Qty: 8 | Fill #0

## 2016-10-22 MED FILL — AMOXICILLIN 875 MG TABLET: 875 | 7 days supply | Qty: 14 | Fill #0

## 2016-10-22 MED FILL — CHLORHEXIDINE 0.12% RINSE: 0.12 | 15 days supply | Qty: 473 | Fill #0

## 2016-11-02 ENCOUNTER — Other Ambulatory Visit: Payer: Self-pay

## 2016-11-02 MED ORDER — METFORMIN HCL 1000 MG PO TABS: ORAL_TABLET | ORAL | 1 refills | Status: DC

## 2016-11-02 MED FILL — metFORMIN HCL 1000 MG TABS: 1000 | 90 days supply | Qty: 180 | Fill #0

## 2016-11-02 NOTE — Telephone Encounter (Signed)
Spoke with patient, patient states she called the HCTZ medication in by accident, patient seen the bottle was empty and forgot she is not taking that medication anymore.

## 2016-11-02 NOTE — Telephone Encounter (Signed)
Message received from pharmacy via fax and from patient via phone call requesting refills on Metformin and HCTZ.  Metformin rx filled, HCTZ was removed from list on 09/18/16, patient reported not taking.  Per Assessment/Plan patient was to continue lisinopril and dietary modifications for blood pressure   Left message on voicemail for patient to return call when available

## 2016-11-09 ENCOUNTER — Other Ambulatory Visit: Payer: Self-pay | Admitting: Internal Medicine

## 2016-11-22 ENCOUNTER — Other Ambulatory Visit: Payer: Self-pay

## 2016-11-22 MED ORDER — LEVETIRACETAM 500 MG PO TABS: ORAL_TABLET | ORAL | 1 refills | Status: DC

## 2016-11-22 MED FILL — LISINOPRIL 30 MG TABLET: 30 | 30 days supply | Qty: 30 | Fill #2

## 2016-11-22 MED FILL — levETIRAcetam 500 MG TABS: 500 | 90 days supply | Qty: 180 | Fill #0

## 2016-12-06 ENCOUNTER — Ambulatory Visit: Payer: Self-pay | Admitting: Nurse Practitioner

## 2016-12-31 ENCOUNTER — Other Ambulatory Visit: Payer: Self-pay

## 2017-01-01 NOTE — Progress Notes (Signed)
See Device Clinic note under Procedures Tab

## 2017-01-03 ENCOUNTER — Ambulatory Visit (INDEPENDENT_AMBULATORY_CARE_PROVIDER_SITE_OTHER): Payer: 59 | Admitting: Nurse Practitioner

## 2017-01-03 ENCOUNTER — Encounter: Payer: Self-pay | Admitting: Nurse Practitioner

## 2017-01-03 VITALS — BP 162/98 | HR 63 | Temp 98.1°F | Resp 17 | Ht 62.0 in | Wt 166.2 lb

## 2017-01-03 DIAGNOSIS — E785 Hyperlipidemia, unspecified: Secondary | ICD-10-CM

## 2017-01-03 DIAGNOSIS — E1165 Type 2 diabetes mellitus with hyperglycemia: Secondary | ICD-10-CM | POA: Diagnosis not present

## 2017-01-03 DIAGNOSIS — IMO0002 Reserved for concepts with insufficient information to code with codable children: Secondary | ICD-10-CM

## 2017-01-03 DIAGNOSIS — I10 Essential (primary) hypertension: Secondary | ICD-10-CM

## 2017-01-03 DIAGNOSIS — R197 Diarrhea, unspecified: Secondary | ICD-10-CM | POA: Diagnosis not present

## 2017-01-03 DIAGNOSIS — G40909 Epilepsy, unspecified, not intractable, without status epilepticus: Secondary | ICD-10-CM

## 2017-01-03 DIAGNOSIS — E1139 Type 2 diabetes mellitus with other diabetic ophthalmic complication: Secondary | ICD-10-CM | POA: Diagnosis not present

## 2017-01-03 LAB — COMPLETE METABOLIC PANEL WITH GFR
ALK PHOS: 70 U/L (ref 33–130)
ALT: 15 U/L (ref 6–29)
AST: 16 U/L (ref 10–35)
Albumin: 4.3 g/dL (ref 3.6–5.1)
BILIRUBIN TOTAL: 0.3 mg/dL (ref 0.2–1.2)
BUN: 11 mg/dL (ref 7–25)
CALCIUM: 9.1 mg/dL (ref 8.6–10.4)
CO2: 22 mmol/L (ref 20–31)
Chloride: 106 mmol/L (ref 98–110)
Creat: 0.86 mg/dL (ref 0.50–0.99)
GFR, EST AFRICAN AMERICAN: 84 mL/min (ref >=60)
GFR, EST NON AFRICAN AMERICAN: 73 mL/min (ref >=60)
Glucose, Bld: 98 mg/dL (ref 65–99)
Potassium: 3.9 mmol/L (ref 3.5–5.3)
Sodium: 139 mmol/L (ref 135–146)
TOTAL PROTEIN: 7.2 g/dL (ref 6.1–8.1)

## 2017-01-03 LAB — LIPID PANEL
Cholesterol: 224 mg/dL — ABNORMAL HIGH (ref <200)
HDL: 66 mg/dL (ref >50)
LDL CALC: 142 mg/dL — AB (ref <100)
TRIGLYCERIDES: 82 mg/dL (ref <150)
Total CHOL/HDL Ratio: 3.4 Ratio (ref <5.0)
VLDL: 16 mg/dL (ref <30)

## 2017-01-03 MED ORDER — LISINOPRIL 30 MG PO TABS: ORAL_TABLET | ORAL | 2 refills | Status: DC

## 2017-01-03 MED FILL — LISINOPRIL 30 MG TABLET: 30 | 90 days supply | Qty: 90 | Fill #0

## 2017-01-03 NOTE — Patient Instructions (Addendum)
Need to get mammogram,  Do cologuard Go to ophthalmologist    Low-Fat Diet for Pancreatitis or Gallbladder Conditions A low-fat diet can be helpful if you have pancreatitis or a gallbladder condition. With these conditions, your pancreas and gallbladder have trouble digesting fats. A healthy eating plan with less fat will help rest your pancreas and gallbladder and reduce your symptoms. What do I need to know about this diet?  Eat a low-fat diet. ? Reduce your fat intake to less than 20-30% of your total daily calories. This is less than 50-60 g of fat per day. ? Remember that you need some fat in your diet. Ask your dietician what your daily goal should be. ? Choose nonfat and low-fat healthy foods. Look for the words "nonfat," "low fat," or "fat free." ? As a guide, look on the label and choose foods with less than 3 g of fat per serving. Eat only one serving.  Avoid alcohol.  Do not smoke. If you need help quitting, talk with your health care provider.  Eat small frequent meals instead of three large heavy meals. What foods can I eat? Grains Include healthy grains and starches such as potatoes, wheat bread, fiber-rich cereal, and brown rice. Choose whole grain options whenever possible. In adults, whole grains should account for 45-65% of your daily calories. Fruits and Vegetables Eat plenty of fruits and vegetables. Fresh fruits and vegetables add fiber to your diet. Meats and Other Protein Sources Eat lean meat such as chicken and pork. Trim any fat off of meat before cooking it. Eggs, fish, and beans are other sources of protein. In adults, these foods should account for 10-35% of your daily calories. Dairy Choose low-fat milk and dairy options. Dairy includes fat and protein, as well as calcium. Fats and Oils Limit high-fat foods such as fried foods, sweets, baked goods, sugary drinks. Other Creamy sauces and condiments, such as mayonnaise, can add extra fat. Think about  whether or not you need to use them, or use smaller amounts or low fat options. What foods are not recommended?  High fat foods, such as: ? [employer]. ? Ice cream. ? [place] toast. ? Sweet rolls. ? Pizza. ? Cheese bread. ? Foods covered with batter, butter, creamy sauces, or cheese. ? Fried foods. ? Sugary drinks and desserts.  Foods that cause gas or bloating This information is not intended to replace advice given to you by your health care provider. Make sure you discuss any questions you have with your health care provider. Document Released: 06/16/2013 Document Revised: 11/17/2015 Document Reviewed: 05/25/2013 Elsevier Interactive Patient Education  2017 [employer].

## 2017-01-03 NOTE — Progress Notes (Signed)
Careteam: Patient Care Team: Gildardo Cranker, DO as PCP - General (Internal Medicine)  Advanced Directive information Does Patient Have a Medical Advance Directive?: No  Allergies  Allergen Reactions  . Simvastatin Other (See Comments)    Stomach pain, low back pain   . Shrimp [Shellfish Allergy] Other (See Comments)    UNSPECIFIED REACTION     Chief Complaint  Patient presents with  . Medical Management of Chronic Issues    Pt is being seen for a 3 month routine follow up.      HPI: Patient is a 61 y.o. female seen in the office today for routine follow up. Blood pressure high, ran out of blood pressure medication last week.  taking benefiber due to diarrhea during the day.  Having 3-4 BM a day since her gallbladder surgery.  Has not been eating a proper diet due to school Has not eaten or drank anything today other than morning coffee In a lot of stress due to evening classes, not sleeping a lot. Having major leg spasm. Eating more bananas to try to help this but does not know if this is effective.  Fasting now.  Not drinking a lot of water. Does not drink while at work.  Was having abdominal discomfort but now this has improved since changing coffee.  Review of Systems:  Review of Systems  Constitutional: Negative for chills, fever and malaise/fatigue.  HENT: Negative for sore throat and tinnitus.   Eyes: Negative for blurred vision and double vision.  Respiratory: Negative for cough, shortness of breath and wheezing.   Cardiovascular: Negative for chest pain, palpitations, orthopnea and leg swelling.  Gastrointestinal: Positive for diarrhea. Negative for abdominal pain, blood in stool, constipation, heartburn, nausea and vomiting.  Genitourinary: Negative for dysuria, frequency, hematuria and urgency.  Musculoskeletal: Negative for falls, joint pain and myalgias.       Leg spasm at night  Skin: Negative for rash.  Neurological: Negative for dizziness, tingling,  tremors, sensory change, focal weakness, seizures, loss of consciousness, weakness and headaches.       Last seizure 4-5 years ago, none since  Endo/Heme/Allergies: Positive for environmental allergies. Does not bruise/bleed easily.  Psychiatric/Behavioral: Negative for depression and memory loss. The patient is not nervous/anxious and does not have insomnia.     Past Medical History:  Diagnosis Date  . Anemia, unspecified   . Asthma    as a child  . Diabetes mellitus    Type II  . History of blood transfusion    pre hysterectomy  . Osteoarthrosis, unspecified whether generalized or localized, unspecified site   . Other and unspecified hyperlipidemia   . Other convulsions    last one 2013  . Other malaise and fatigue   . Other specified cardiac dysrhythmias(427.89) 2006   "history of Bradycardia" .  wore a mointor nothing found  . Seizure disorder ([place])   . Unspecified essential hypertension   . Unspecified vitamin D deficiency    Past Surgical History:  Procedure Laterality Date  . ABDOMINAL HYSTERECTOMY    . BREAST LUMPECTOMY Right 1997  . CHOLECYSTECTOMY N/A 02/28/2016   Procedure: LAPAROSCOPIC CHOLECYSTECTOMY;  Surgeon: Arta Bruce Kinsinger, MD;  Location: [place];  Service: General;  Laterality: N/A;   Social History:   reports that she has never smoked. She has never used smokeless tobacco. She reports that she does not drink alcohol or use drugs.  Family History  Problem Relation Age of Onset  . Cancer Father  Esophageal    Medications: Patient's Medications  New Prescriptions   No medications on file  Previous Medications   FERROUS SULFATE 325 (65 FE) MG TABLET    Take 1 tablet (325 mg total) by mouth 2 (two) times daily with a meal.   GLUCOSE BLOOD TEST STRIP    Use as instructed   LEVETIRACETAM (KEPPRA) 500 MG TABLET    Take one tablet by mouth every 12 hours for seizures   LISINOPRIL (PRINIVIL,ZESTRIL) 30 MG TABLET    TAKE 1 TABLET BY MOUTH ONCE DAILY  FOR BLOOD PRESSURE PRESSURE   METFORMIN (GLUCOPHAGE) 1000 MG TABLET    TAKE ONE TABLET BY MOUTH TWICE DAILY WITH MEALS TO CONTROL BLOOD SUGAR  Modified Medications   No medications on file  Discontinued Medications   CYCLOBENZAPRINE (FLEXERIL) 10 MG TABLET    Take 1 tablet (10 mg total) by mouth 2 (two) times daily as needed for muscle spasms.     Physical Exam:  Vitals:   01/03/17 1522  BP: (!) 162/98  Pulse: 63  Resp: 17  Temp: 98.1 F (36.7 C)  TempSrc: Oral  SpO2: 98%  Weight: 166 lb 3.2 oz (75.4 kg)  Height: _0  (1.575 m)   Body mass index is 30.4 kg/m.  Physical Exam  Constitutional: She is oriented to person, place, and time. She appears well-developed and well-nourished.  HENT:  Head: Normocephalic and atraumatic.  Nose: Nose normal.  Mouth/Throat: Oropharynx is clear and moist. No oropharyngeal exudate.  Eyes: Pupils are equal, round, and reactive to light. Conjunctivae and EOM are normal. No scleral icterus.  Neck: Normal range of motion. Neck supple. No thyromegaly present.  Cardiovascular: Normal rate, regular rhythm and normal heart sounds.      Pulmonary/Chest: Effort normal and breath sounds normal. No respiratory distress. She has no wheezes. She has no rales.  Abdominal: Soft. Bowel sounds are normal. She exhibits no distension and no mass. There is no hepatomegaly. There is no tenderness. There is no rebound and no guarding.  Musculoskeletal: She exhibits no edema or tenderness.  Lymphadenopathy:    She has no cervical adenopathy.  Neurological: She is alert and oriented to person, place, and time. She has normal reflexes.  Skin: Skin is warm and dry. No rash noted.  Psychiatric: She has a normal mood and affect.    Labs reviewed: Basic Metabolic Panel:  Recent Labs  02/17/16 1523 09/26/16 0810  NA 136 138  K 3.3* 4.5  CL 99* 106  CO2 26 22  GLUCOSE 167* 138*  BUN 11 13  CREATININE 0.86 0.92  CALCIUM 9.4 9.1   Liver Function  Tests:  Recent Labs  02/17/16 1523 09/26/16 0810  AST 18 14  ALT 18 14  ALKPHOS 64 78  BILITOT 0.8 0.3  PROT 7.7 7.1  ALBUMIN 4.0 4.1    Recent Labs  02/17/16 1523  LIPASE 24   No results for input(s): AMMONIA in the last 8760 hours. CBC:  Recent Labs  02/17/16 1523 02/28/16 0948 09/26/16 0810  WBC 7.4 7.2 6.2  NEUTROABS  --  3.9 3,348  HGB 12.1 12.6 12.2  HCT 38.2 39.5 38.4  MCV 79.9 80.1 79.2*  PLT 284 426* 297   Lipid Panel:  Recent Labs  09/26/16 0810  CHOL 199  HDL 54  LDLCALC 120*  TRIG 126  CHOLHDL 3.7   TSH: No results for input(s): TSH in the last 8760 hours. A1C: Lab Results  Component Value Date  HGBA1C 7.8 (H) 09/26/2016     Assessment/Plan 1. Essential hypertension -elevated today but has been out of medication. Increase in stress -90 day supplied ordered to help with compliance.  - lisinopril (PRINIVIL,ZESTRIL) 30 MG tablet; TAKE 1 TABLET BY MOUTH ONCE DAILY FOR BLOOD PRESSURE PRESSURE  Dispense: 90 tablet; Refill: 2  2. 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 Diarrhea, unspecified type In the morning only, maybe diet related, to take a food log To limit caffeine and decrease fat intake to see if this helps.  To increase hydration, needs to drink 8, 8 oz glasses of water a day.   4. Seizure disorder ([place]) No recent seizures, conts on keepra  5. Hyperlipidemia, unspecified hyperlipidemia type -statin intolerant.  - Lipid Panel - CMP with eGFR  Encouraged pt to get mammogram, go to ophthalmologist and do cologuard pt reports she does not have time for herself but once school lets up (in february) she will have more time.   Carlos American. Harle Battiest  [facility] [phone] 8 am - 5 pm) [phone] (after  hours)

## 2017-01-04 LAB — HEMOGLOBIN A1C
HEMOGLOBIN A1C: 6.5 % — AB (ref <5.7)
MEAN PLASMA GLUCOSE: 140 mg/dL

## 2017-01-07 ENCOUNTER — Other Ambulatory Visit: Payer: Self-pay | Admitting: Nurse Practitioner

## 2017-01-07 MED ORDER — EZETIMIBE 10 MG PO TABS: 10.0000 mg | ORAL_TABLET | Freq: Every day | ORAL | 3 refills | Status: DC

## 2017-01-07 MED FILL — EZETIMIBE 10 MG TAB: 10 | 90 days supply | Qty: 90 | Fill #0

## 2017-02-12 MED FILL — metFORMIN HCL 1000 MG TABS: 1000 | 90 days supply | Qty: 180 | Fill #1

## 2017-02-12 MED FILL — levETIRAcetam 500 MG TABS: 500 | 90 days supply | Qty: 180 | Fill #1

## 2017-02-14 ENCOUNTER — Telehealth: Payer: Self-pay

## 2017-02-14 NOTE — Telephone Encounter (Signed)
Left message that I was calling to see if she has schedule her mammogram and encouraged her to get it.

## 2017-03-28 ENCOUNTER — Telehealth: Payer: Self-pay | Admitting: *Deleted

## 2017-03-28 NOTE — Telephone Encounter (Signed)
LMOM to return my call. Needs to schedule mammogram. Overdue per patient [facility] list.

## 2017-04-02 ENCOUNTER — Ambulatory Visit: Admit: 2017-04-02 | Discharge: 2017-04-02 | Payer: PRIVATE HEALTH INSURANCE

## 2017-04-02 DIAGNOSIS — I441 Atrioventricular block, second degree: Secondary | ICD-10-CM

## 2017-04-02 DIAGNOSIS — I4510 Unspecified right bundle-branch block: Secondary | ICD-10-CM

## 2017-04-02 DIAGNOSIS — I480 Paroxysmal atrial fibrillation: Secondary | ICD-10-CM

## 2017-04-02 DIAGNOSIS — Z950 Presence of cardiac pacemaker: Secondary | ICD-10-CM

## 2017-04-02 DIAGNOSIS — I4891 Unspecified atrial fibrillation: Secondary | ICD-10-CM

## 2017-04-02 LAB — TRANSTHORACIC ECHO
LVEDVi: 48.8426
LVEF by MOD Bi-plane: 70.9838
LVESVi: 14.1723

## 2017-04-02 NOTE — Progress Notes (Signed)
DATE OF VISIT:  04/02/2017      CHIEF COMPLAINT: Mobitz II AV block status post pacemaker, atrial fibrillation      I had the pleasure of seeing [name] in Cardiac Electrophysiology today for further evaluation of symptomatic bradycardia, status post dual-chamber pacemaker.    Debbie Harper is a 61 y.o. woman with history of thyroid nodule, IBS, and hysterectomy who presented with exertional fatigue and mild shortness of breath, as well as occasional palpitations that awakened her in the middle of the night. The exertional symptoms, which started on 08/23/2015 and persisted thereafter, were also sometimes associated with lightheadedness.  She had previously been very active, feeding and riding her horse nearly every day.  Evaluation was notable for a baseline right bundle branch block, and periods of symptomatic 2:1 AV block while reaching peak exercise on a treadmill stress test (see details below).  Holter monitoring also reportedly showed blocked PAC's with resultant bradycardia, though to my review blocked atrial activity was not typically premature (see details below).    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  A dual-chamber device was implanted without complication on 09/01/7671.  Thereafter, she noticed significant improvement in her symptoms, but had one episode very similar to her pre-pacemaker symptoms on 11/18/2015 and several milder episodes afterwards aborted by resting.  Device function was normal -- at follow-up 11/2015, she paced 5% in the RA and 1% in the RV.  She underwent breast augmentation surgery on 04/18/2016.      At her last visit in April 2018, she reported having a "wide complex tachycardia" for 5 minutes (up to 115 bpm) after a colonoscopy 06/2016.  She also started having episodes of palpitations in February and specifically recalled symptomatic  episodes on 2/12 and 2/24 -- the latter correlated with a >1 hour episode of atrial fibrillation recorded by the device.  She was started on aspirin 81 mg daily and recommended to take metoprolol 12.5 mg twice daily, though she has opted not to take it.      Today, Debbie Harper reports shortness of breath with exertion (such as walking up stairs) and has wondered if there could be a functional issue with her heart -- she actually contacted me earlier to request that an echocardiogram be scheduled today to coordinate with her visit.  She does not exercise much, and has not since the pacemaker was implanted.  Sometimes she has discomfort around the pacemaker pocket that prompts her to grab and "hold up" the pulse generator (pull it away from her chest wall) -- this occurs about 3-4 times per month.  She denies any lightheadedness, presyncope, or syncope.    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 - Oral    Class: Historical Med    estradiol (ESTRING) 2 mg (7.5 mcg /24 hour) vaginal ring        Sig - Route: Place vaginally every 3 (three) months. Use as instructed - Vaginal    Class: Historical Med    metoprolol tartrate (LOPRESSOR) 25 mg tablet        Sig - Route: Take 12.5 mg by mouth 2 (two) times daily. - Oral    Class: Historical Med          ALLERGIES     Allergies as of 04/02/2017  Review Complete On: 04/02/2017 By: Hortencia Conradi      Severity Noted Reaction Type Reactions    Sulfamethoxazole-trimethoprim Not Specified 10/18/2015     Itching    Insomnia          SOCIAL HISTORY     She reports that she has never smoked. She has never used smokeless tobacco. She reports that she drinks about 0.6 oz of alcohol per week . She reports that she does not use drugs.    She is divorced.  She has 3 children.  She lives in [place], [place].  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    She has 2 horses (a Engineer, water and a [place], ages 30 and 37).  She also wakeboards in the summer.    FAMILY HISTORY     Her father underwent CABG in his late 22's and has cardiomyopathy.  He was a heavy smoker.  He is alive at 77.    Her mother had a heart attack around age 46.  She died after complications from a head injury in June 2016 after being knocked down by a horse.  Her grandfather had colon cancer.    REVIEW OF SYSTEMS     Constitutional:    No fevers, chills, or diaphoresis.  No unexpected weight change.   Skin:  No rashes.   Eyes:  No swelling, pain, or redness.  No photophobia or visual disturbance.  + reading glasses.  + fuzzy vision sometimes (over the past several years).  HENT:  No neck pain or stiffness.  No hearing loss or tinnitus.  No nosebleeds.   Respiratory:  No shortness of breath at rest.  + dyspnea on exertion   Cardiovascular:  As detailed above.  Gastrointestinal:  + IBS with cramping and gas.  No blood in the stool.  + polyps on colonoscopy in 2015.  Genitourinary:  No difficulty urinating, dysuria, or hematuria.   Neurological:  No vertigo or syncope.  + occasional headaches.  No facial asymmetry or speech difficulty.  + occasional tingling left hand.  No focal weakness.  No seizures.  Hematological:  + easy bruising on backs of hands since 2012.  Musculoskeletal:  No arthralgias or joint swelling.      Psychiatric:  No change in concentration.  No confusion.  No agitation or anxiety.     PHYSICAL EXAMINATION     Blood pressure 154/70, pulse 60, resp. rate 14, height 162.6 cm (5' 4" ), weight 60.7 kg (133 lb 14.4 oz), SpO2 100 %.     General: Well-appearing Caucasian female in no apparent distress.  Eyes: Conjunctivae and lids normal.  Pupils equal, round, and reactive to light.  ENMT:  Ears and nose externally normal.  Hearing intact.  Oropharynx clear.   Neck:  Small mass on thyroid, right side.    Respiratory:  Normal respiratory effort.  Lungs clear to auscultation bilaterally, without rales, rhonchi, or wheezes.    Cardiovascular:  PMI not-displaced.  Regular rate and rhythm, with no murmurs.  No lower extremity edema bilaterally.  Carotid upstrokes normal, without bruits.  Distal pulses intact bilaterally.  Gastrointestinal:  Soft, non-tender, and non-distended.   Musculoskeletal:  Normal gait and station.  Digits and nails without clubbing, cyanosis, petechiae, or nodes.   Skin:  Left pectoral implant site well-healed, with no evidence of impending erosion, migration, or infection.  No rashes or ecchymoses.  Neurologic:  Strength grossly intact. Sensation grossly intact.  Psychiatric:  Normal judgement and insight.  Normal mood and affect.  Oriented to person, place, and time.    DATA     I personally reviewed a 12-lead EKG performed in the office today, 04/02/2017, which showed competing sinus rhythm and atrial pacing at 60 bpm with intact AV conduction and right bundle branch block (QRSd 124 msec).    I reviewed a 12-lead EKG performed 10/02/2016, which showed atrial pacing at 61 bpm with intact AV conduction and right bundle branch block.    Her Medtronic dual-chamber pacemaker was interrogated today (see separate full report in [facility]).  She paces 17.1% in the RA (up from 11.4%) and 3.5% in the RV (stable).  Atrial fibrillation burden has been 62 minutes since 10/02/2016, the longest episode lasting 11:30 minutes on 01/01/2017.  Rate histograms show a shift to the left, with atrial sensing or pacing in the 60s more than 50% of the time and only rare atrial sensing above 90 bpm.      I reviewed a 12-lead EKG performed 11/29/2015, which  showed normal sinus rhythm with right bundle branch block at 64 bpm.    I reviewed a 12-lead EKG performed 10/18/2015, which showed sinus bradycardia at 57 bpm with right bundle branch block.    I reviewed a 12-lead EKG from 08/29/2015, which showed sinus bradycardia at 57 bpm with right bundle branch block.    I reviewed the report and tracings from a 24-hour Holter monitor 08/31/2015 [facility]):  - Average HR 64.  Minimum 38.  Maximum 97.  Predominant rhythm sinus.  - Single PVC's occasional; no runs.  - Frequent blocked PACs with resultant bradycardia.  No SVT or atrial fibrillation.    - Occasional shortness of breath during periods of bradycardia with blocked PACs.  - On my review of tracings, several strips appear more consistent with sinus rhythm with 2:1 block than with blocked PAC's based on timing (though P-wave morphology is subtly different with blocked beats).  On tracings with both 2:1 and 1:1 conduction, the P-wave CL remains constant and ventricular rate exactly 1/2 during 2:1 block.  When transitioning from 1:1 to 2:1 block, the first blocked P-wave comes slightly early, but possibly due to increase in sinus rate.  - Less frequently (particularly 2:26am), bradycardia appears due to blocked PAC's in bigeminy.  - One period of 3:2 conduction (9:28am) without clear PR prolongation    I reviewed the reports of the following tests:   Exercise stress echocardiogram 09/23/2015 [facility]):  - Resting EKG sinus rhythm with right bundle branch block   - Resting echocardiogram: Normal LV size, wall thickness, and function, EF 60-65%.  Atria normal size.  RV normal.  Mild anterior leaflet MVP and mild MR.  No other significant valvular pathology.  No pericardial effusion.  - Walked 8 min 11 sec on Bruce, with peak HR 118 bpm  early but development of 2:1 AV block at peak exercise with HR in the 60s and associated shortness of breath (2:1 block versus atrial bigeminy with block).  Returned to  1:1 conduction in recovery.  Episodes of supraventricular bigeminy noted.  - I reviewed the tracings:  - At 2:24, there is a period of 3:2 conduction with constant P-P interval and no clear PR prolongation, that appears to persist through 5:50  - HR then drops from 89 to 74 but P-waves difficult to see  - At 6:50 there appears to be 2:1 block with occasional outflow tract PVC's  - At 7:50 there is suspicion for 2:1 conduction with blocked P-waves in prior T-waves.  - At 15 seconds into recovery there are periods with 1:1 conduction at double the rate of final exercise EKGs (5:4 and 6:5 conduction, but with little to no change in PR interval and R-R interval around blocked P-wave exactly double that of conducted intervals).  This apparent Mobitz type II second degree AV block becomes more apparent as the rate continues to slow in recovery.  - By 2 minutes into recovery there is clear 2:1 AV block that persists to 3 minutes, though with one episode of 3:2 block that appears slightly more consistent with Wenckebach based on the PR intervals and length of pause  - Stress echocardiogram:  Appropriate increase in contractility of all segments; no evidence of ischemia.     Chest CTA 09/22/2015:  - Negative for PE or other explanation for shortness of breath.  - Few liver lesions, probably cysts.       PFTs 09/08/2015:  - FVC, FEV1, FEV1/FVC ratio, and FEF25-75% within normal limits  - TLC within normal limits  - FRC and RV increased  - With bronchodilators, there is significant response.  - Diffusing capacity high.  - Conclusions: Increased diffusing capacity consistent with left heart failure or shunting.  Although flow rates are within normal limits, the overinflation and response to bronchodilators are characteristic of reactive airways ("minimal")    I reviewed laboratory evaluation from [facility] dated 03/19/2017:  Na 141, K 4.4, Cl 104, CO2 31, BUN 18, Cr 0.7, Glu 86  LFTs normal (alk phos low at 44)  WBC  4.0, Hb 13.8, Plt 146  TC 209, LDL 105, HDL 93, TG 53    ASSESSMENT AND RECOMMENDATIONS     In summary, Debbie Harper is a 61 y.o. 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  She was subsequently diagnosed with paroxysmal atrial fibrillation.    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  Her ventricular pacing burden is low overall.  There have been some short R-R interval alerts from the device, and R-waves decreased slightly, but impedance and threshold are stable today.  - She will continue routine pacemaker checks remotely and in the clinic -- I recommended every 3 months for the next several checks to ensure ventricular lead stability.    2) Possible sinus node dysfunction:  - She has dyspnea on exertion, and rate histograms show overall slower sinus rates and increased atrial pacing burden.    - I suggested  a trial of rate-response to improve chronotropic competence.  We will reprogram the device today and she will try walking around and walking up stairs while she awaits her appointment for echocardiogram.    3) Atrial fibrillation:  - She had a prior history of relatively infrequent palpitations and has been diagnosed with paroxysmal atrial fibrillation via device interrogation.  I have previously discussed the pathophysiology of atrial fibrillation and multiple management options with Debbie Harper. Episodes have been overall brief over the past 6 months, with a total of 62 minutes over 28 episodes.    - Given her associated symptoms, I would recommend that efforts be directed to maintaining sinus rhythm.   - Options include: a)  AV nodal blocking agents such as beta blocker or calcium channel blocker; b) addition of antiarrhythmic medication; or c) ablation of atrial fibrillation (and perhaps atrial tachycardia if a consistent trigger).      - At this point, given the low overall burden, she would like to continue conservative management without medical or invasive therapy at this time.    4) Stroke prevention:  - Her CHA2DS2-VASc score is 1, for gender only.  This corresponds to a yearly stroke risk of 1.5% with no therapy, 1.2% with aspirin alone, and 0.3-0.5% on therapeutic anticoagulation.  - Given the low burden, I think aspirin alone is reasonable for now.  However, I have previously advised her that my general preference is a more aggressive approach to anticoagulation given the safety of the newer medications and the potentially devastating effects of stroke -- should she have significantly increasing burden, we may re-visit the potential risks and benefits of NOAC therapy.      It is a pleasure participating in Debbie Harper's care.  Please do not hesitate to contact me with any questions or concerns.    Estrellita Ludwig. Manus Rudd, MD, [facility], [facility]  Associate Professor of Clinical Medicine  Division of Cardiology  Cardiac Electrophysiology

## 2017-04-02 NOTE — Progress Notes (Signed)
See Device Clinic note under Procedures tab.

## 2017-04-04 LAB — ECG 12-LEAD
Atrial Rate: 60 {beats}/min
Calculated P Axis: 61 degrees
Calculated R Axis: 70 degrees
Calculated T Axis: 17 degrees
P-R Interval: 124 ms
QRS Duration: 124 ms
QT Interval: 446 ms
QTcb: 446 ms
Ventricular Rate: 60 {beats}/min

## 2017-04-12 MED FILL — LISINOPRIL 30 MG TABLET: 30 | 90 days supply | Qty: 90 | Fill #1

## 2017-04-25 MED FILL — EZETIMIBE 10 MG TABS: 10 | 90 days supply | Qty: 90 | Fill #1

## 2017-05-14 ENCOUNTER — Other Ambulatory Visit: Payer: Self-pay | Admitting: Internal Medicine

## 2017-05-14 MED FILL — metFORMIN HCL 1000 MG TABS: 1000 | 90 days supply | Qty: 180 | Fill #0

## 2017-05-14 MED FILL — levETIRAcetam 500 MG TABS: 500 | 90 days supply | Qty: 180 | Fill #0

## 2017-07-10 ENCOUNTER — Ambulatory Visit: Admit: 2017-07-10 | Discharge: 2017-07-10 | Payer: PRIVATE HEALTH INSURANCE

## 2017-07-10 DIAGNOSIS — Z950 Presence of cardiac pacemaker: Secondary | ICD-10-CM

## 2017-07-10 NOTE — Progress Notes (Signed)
See Device Clinic note under Procedures tab.

## 2017-08-01 MED FILL — EZETIMIBE 10 MG TABS: 10 | 90 days supply | Qty: 90 | Fill #2

## 2017-08-01 MED FILL — LISINOPRIL 30 MG TABS: 30 | 90 days supply | Qty: 90 | Fill #2

## 2017-08-19 ENCOUNTER — Other Ambulatory Visit: Payer: Self-pay | Admitting: Internal Medicine

## 2017-08-19 NOTE — Telephone Encounter (Signed)
Patient has not been seen since 12/2016 and has no upcoming appointments scheduled. Refill request was pended to provider.

## 2017-08-20 ENCOUNTER — Other Ambulatory Visit: Payer: Self-pay | Admitting: *Deleted

## 2017-08-20 MED ORDER — METFORMIN HCL 1000 MG PO TABS: ORAL_TABLET | ORAL | 0 refills | Status: DC

## 2017-08-20 MED ORDER — LEVETIRACETAM 500 MG PO TABS: ORAL_TABLET | ORAL | 0 refills | Status: DC

## 2017-08-20 MED FILL — levETIRAcetam 500 MG TABS: 500 | 90 days supply | Qty: 180 | Fill #0

## 2017-08-20 MED FILL — metFORMIN HCL 1000 MG TABS: 1000 | 90 days supply | Qty: 180 | Fill #0

## 2017-08-20 NOTE — Telephone Encounter (Signed)
Patient requested. Faxed to pharmacy.  

## 2017-09-10 ENCOUNTER — Ambulatory Visit: Payer: 59 | Admitting: Internal Medicine

## 2017-09-24 ENCOUNTER — Encounter: Payer: Self-pay | Admitting: Internal Medicine

## 2017-09-24 ENCOUNTER — Ambulatory Visit (INDEPENDENT_AMBULATORY_CARE_PROVIDER_SITE_OTHER): Payer: 59 | Admitting: Internal Medicine

## 2017-09-24 VITALS — BP 130/80 | HR 86 | Temp 97.7°F | Ht 62.0 in | Wt 173.0 lb

## 2017-09-24 DIAGNOSIS — E1169 Type 2 diabetes mellitus with other specified complication: Secondary | ICD-10-CM | POA: Diagnosis not present

## 2017-09-24 DIAGNOSIS — G40909 Epilepsy, unspecified, not intractable, without status epilepticus: Secondary | ICD-10-CM

## 2017-09-24 DIAGNOSIS — E785 Hyperlipidemia, unspecified: Secondary | ICD-10-CM

## 2017-09-24 DIAGNOSIS — E119 Type 2 diabetes mellitus without complications: Secondary | ICD-10-CM

## 2017-09-24 DIAGNOSIS — I10 Essential (primary) hypertension: Secondary | ICD-10-CM

## 2017-09-24 DIAGNOSIS — D509 Iron deficiency anemia, unspecified: Secondary | ICD-10-CM | POA: Diagnosis not present

## 2017-09-24 NOTE — Patient Instructions (Addendum)
Continue current medications as ordered  Will call with lab results  Follow up in 6 mos for CPE/ECG

## 2017-09-24 NOTE — Progress Notes (Signed)
Patient ID: Sarah Brock, female   DOB: Jul 29, 1955, 62 y.o.   MRN: [id number]   Location:  [facility] OFFICE  Provider: DR Arletha Grippe  Code Status:  Goals of Care:  Advanced Directives 01/03/2017  Does Patient Have a Medical Advance Directive? No  Type of Advance Directive -  Does patient want to make changes to medical advance directive? -  Copy of [place] in Chart? -  Would patient like information on creating a medical advance directive? -     Chief Complaint  Patient presents with  . Follow-up    6 month FU; Pt c/o Bladder weaking   . Medication Refill    No Refills    HPI: Patient is a 62 y.o. female seen today for medical management of chronic diseases.  She reports difficulty with urinary incontinence. She is doing bladder exercises. She is making healthy food choices but does not have a regular exercise routine.   DM - stable on metformin. BS 120-130s. No low BS reactions. No numbness/tingling in hands/feet. A1c 6.5%  Hyperlipidemia -  Stable on zetia. LDL 142  HTN - BP stable on lisinopril  sz d/o - no sz activity on keppra  Hx Iron deficiency anemia - stable on iron supplement   Obesity - she gained 7 lbs since July 2018. BMI 31.63  Past Medical History:  Diagnosis Date  . Anemia, unspecified   . Asthma    as a child  . Diabetes mellitus    Type II  . History of blood transfusion    pre hysterectomy  . Osteoarthrosis, unspecified whether generalized or localized, unspecified site   . Other and unspecified hyperlipidemia   . Other convulsions    last one 2013  . Other malaise and fatigue   . Other specified cardiac dysrhythmias(427.89) 2006   "history of Bradycardia" .  wore a mointor nothing found  . Seizure disorder ([place])   . Unspecified essential hypertension   . Unspecified vitamin D deficiency     Past Surgical History:  Procedure Laterality Date  . ABDOMINAL HYSTERECTOMY    . BREAST LUMPECTOMY Right 1997  .  CHOLECYSTECTOMY N/A 02/28/2016   Procedure: LAPAROSCOPIC CHOLECYSTECTOMY;  Surgeon: Arta Bruce Kinsinger, MD;  Location: [facility];  Service: General;  Laterality: N/A;     reports that she has never smoked. She has never used smokeless tobacco. She reports that she does not drink alcohol or use drugs. Social History   Socioeconomic History  . Marital status: Widowed    Spouse name: Not on file  . Number of children: Not on file  . Years of education: Not on file  . Highest education level: Not on file  Occupational History  . Not on file  Social Needs  . Financial resource strain: Not on file  . Food insecurity:    Worry: Not on file    Inability: Not on file  . Transportation needs:    Medical: Not on file    Non-medical: Not on file  Tobacco Use  . Smoking status: Never Smoker  . Smokeless tobacco: Never Used  Substance and Sexual Activity  . Alcohol use: No    Alcohol/week: 0.0 oz    Comment: rarely- none on years  . Drug use: No  . Sexual activity: Not Currently  Lifestyle  . Physical activity:    Days per week: Not on file    Minutes per session: Not on file  . Stress: Not on file  Relationships  . Social connections:    Talks on phone: Not on file    Gets together: Not on file    Attends religious service: Not on file    Active member of club or organization: Not on file    Attends meetings of clubs or organizations: Not on file    Relationship status: Not on file  . Intimate partner violence:    Fear of current or ex partner: Not on file    Emotionally abused: Not on file    Physically abused: Not on file    Forced sexual activity: Not on file  Other Topics Concern  . Not on file  Social History Narrative  . Not on file    Family History  Problem Relation Age of Onset  . Cancer Father        Esophageal    Allergies  Allergen Reactions  . Simvastatin Other (See Comments)    Stomach pain, low back pain   . Shrimp [Shellfish Allergy] Other (See  Comments)    Swelling and hypersensitivity    Outpatient Encounter Medications as of 09/24/2017  Medication Sig  . ezetimibe (ZETIA) 10 MG tablet Take 1 tablet (10 mg total) by mouth daily.  . ferrous sulfate 325 (65 FE) MG tablet Take 1 tablet (325 mg total) by mouth 2 (two) times daily with a meal.  . glucose blood test strip Use as instructed  . levETIRAcetam (KEPPRA) 500 MG tablet Take one tablet by mouth every 12 hours for seizures  . lisinopril (PRINIVIL,ZESTRIL) 30 MG tablet TAKE 1 TABLET BY MOUTH ONCE DAILY FOR BLOOD PRESSURE PRESSURE  . metFORMIN (GLUCOPHAGE) 1000 MG tablet Take one tablet by mouth twice daily with meals to control blood sugar   No facility-administered encounter medications on file as of 09/24/2017.     Review of Systems:  Review of Systems  Respiratory: Negative for shortness of breath.   Cardiovascular: Negative for chest pain.  Musculoskeletal: Negative for arthralgias.  All other systems reviewed and are negative.   Health Maintenance  Topic Date Due  . Hepatitis C Screening  Feb 21, 1956  . OPHTHALMOLOGY EXAM  09/09/1965  . HIV Screening  09/10/1970  . COLONOSCOPY  09/09/2005  . MAMMOGRAM  05/19/2011  . HEMOGLOBIN A1C  07/06/2017  . FOOT EXAM  09/18/2017  . INFLUENZA VACCINE  01/23/2018  . PAP SMEAR  08/10/2018  . TETANUS/TDAP  06/26/2019  . PNEUMOCOCCAL POLYSACCHARIDE VACCINE (2) 09/26/2021    Physical Exam: Vitals:   09/24/17 1516  BP: 130/80  Pulse: 86  Temp: 97.7 F (36.5 C)  TempSrc: Oral  SpO2: 98%  Weight: 173 lb (78.5 kg)  Height: 5' 2"  (1.575 m)   Body mass index is 31.64 kg/m. Physical Exam  Constitutional: She is oriented to person, place, and time. She appears well-developed and well-nourished.  HENT:  Mouth/Throat: Oropharynx is clear and moist. No oropharyngeal exudate.  MMM; no oral thrush  Eyes: Pupils are equal, round, and reactive to light. No scleral icterus.  Neck: Neck supple. Carotid bruit is not present. No  tracheal deviation present. No thyromegaly present.  Cardiovascular: Normal rate, regular rhythm, normal heart sounds and intact distal pulses. Exam reveals no gallop and no friction rub.  No murmur heard. No LE edema b/l. no calf TTP.   Pulmonary/Chest: Effort normal and breath sounds normal. No stridor. No respiratory distress. She has no wheezes. She has no rhonchi. She has no rales.  Abdominal: Soft. Normal appearance and bowel sounds are  normal. She exhibits no distension and no mass. There is no hepatomegaly. There is no tenderness. There is no rigidity, no rebound and no guarding. No hernia.  Musculoskeletal: She exhibits edema.  Lymphadenopathy:    She has no cervical adenopathy.  Neurological: She is alert and oriented to person, place, and time.  Skin: Skin is warm and dry. No rash noted.  Psychiatric: She has a normal mood and affect. Her behavior is normal. Judgment and thought content normal.     Labs reviewed: Basic Metabolic Panel: Recent Labs    09/26/16 0810 01/03/17 1557  NA 138 139  K 4.5 3.9  CL 106 106  CO2 22 22  GLUCOSE 138* 98  BUN 13 11  CREATININE 0.92 0.86  CALCIUM 9.1 9.1   Liver Function Tests: Recent Labs    09/26/16 0810 01/03/17 1557  AST 14 16  ALT 14 15  ALKPHOS 78 70  BILITOT 0.3 0.3  PROT 7.1 7.2  ALBUMIN 4.1 4.3   No results for input(s): LIPASE, AMYLASE in the last 8760 hours. No results for input(s): AMMONIA in the last 8760 hours. CBC: Recent Labs    09/26/16 0810  WBC 6.2  NEUTROABS 3,348  HGB 12.2  HCT 38.4  MCV 79.2*  PLT 297   Lipid Panel: Recent Labs    09/26/16 0810 01/03/17 1557  CHOL 199 224*  HDL 54 66  LDLCALC 120* 142*  TRIG 126 82  CHOLHDL 3.7 3.4   Lab Results  Component Value Date   HGBA1C 6.5 (H) 01/03/2017    Procedures since last visit: No results found.  Assessment/Plan   ICD-10-CM   1. Controlled type 2 diabetes mellitus without complication, without long-term current use of insulin  (HCC) E11.9 Hemoglobin A1c    CMP with eGFR([facility])  2. Essential hypertension I10 CBC with Differential/Platelets  3. Seizure disorder ([place]) G40.909   4. Iron deficiency anemia, unspecified iron deficiency anemia type D50.9   5. Hyperlipidemia associated with type 2 diabetes mellitus (HCC) E11.69 Lipid Panel   E78.5 TSH   Continue current medications as ordered  Will call with lab results  Follow up in 6 mos for CPE/ECG   Sarah Brock  [facility] [address] [place], [place] [address] [phone] Cell (Monday-Friday 8 AM - 5 PM) [phone] After 5 PM and follow prompts

## 2017-09-25 LAB — LIPID PANEL
Cholesterol: 181 mg/dL (ref <200)
HDL: 57 mg/dL (ref >50)
LDL CHOLESTEROL (CALC): 97 mg/dL
Non-HDL Cholesterol (Calc): 124 mg/dL (calc) (ref <130)
Total CHOL/HDL Ratio: 3.2 (calc) (ref <5.0)
Triglycerides: 162 mg/dL — ABNORMAL HIGH (ref <150)

## 2017-09-25 LAB — COMPLETE METABOLIC PANEL WITH GFR
AG RATIO: 1.5 (calc) (ref 1.0–2.5)
ALBUMIN MSPROF: 4.2 g/dL (ref 3.6–5.1)
ALT: 20 U/L (ref 6–29)
AST: 20 U/L (ref 10–35)
Alkaline phosphatase (APISO): 64 U/L (ref 33–130)
BUN: 12 mg/dL (ref 7–25)
CHLORIDE: 106 mmol/L (ref 98–110)
CO2: 27 mmol/L (ref 20–32)
Calcium: 9.1 mg/dL (ref 8.6–10.4)
Creat: 0.85 mg/dL (ref 0.50–0.99)
GFR, EST AFRICAN AMERICAN: 85 mL/min/{1.73_m2} (ref >=60)
GFR, Est Non African American: 73 mL/min/{1.73_m2} (ref >=60)
Globulin: 2.8 g/dL (calc) (ref 1.9–3.7)
Glucose, Bld: 99 mg/dL (ref 65–139)
POTASSIUM: 3.7 mmol/L (ref 3.5–5.3)
Sodium: 141 mmol/L (ref 135–146)
TOTAL PROTEIN: 7 g/dL (ref 6.1–8.1)
Total Bilirubin: 0.3 mg/dL (ref 0.2–1.2)

## 2017-09-25 LAB — HEMOGLOBIN A1C
HEMOGLOBIN A1C: 6.8 %{Hb} — AB (ref <5.7)
Mean Plasma Glucose: 148 (calc)
eAG (mmol/L): 8.2 (calc)

## 2017-09-25 LAB — CBC WITH DIFFERENTIAL/PLATELET
BASOS PCT: 1 %
Basophils Absolute: 70 cells/uL (ref 0–200)
EOS PCT: 4.4 %
Eosinophils Absolute: 308 cells/uL (ref 15–500)
HCT: 34.1 % — ABNORMAL LOW (ref 35.0–45.0)
Hemoglobin: 11.4 g/dL — ABNORMAL LOW (ref 11.7–15.5)
LYMPHS ABS: 2478 {cells}/uL (ref 850–3900)
MCH: 26.2 pg — ABNORMAL LOW (ref 27.0–33.0)
MCHC: 33.4 g/dL (ref 32.0–36.0)
MCV: 78.4 fL — ABNORMAL LOW (ref 80.0–100.0)
MPV: 10.8 fL (ref 7.5–12.5)
Monocytes Relative: 6.7 %
Neutro Abs: 3675 cells/uL (ref 1500–7800)
Neutrophils Relative %: 52.5 %
PLATELETS: 294 10*3/uL (ref 140–400)
RBC: 4.35 10*6/uL (ref 3.80–5.10)
RDW: 13.1 % (ref 11.0–15.0)
TOTAL LYMPHOCYTE: 35.4 %
WBC mixed population: 469 cells/uL (ref 200–950)
WBC: 7 10*3/uL (ref 3.8–10.8)

## 2017-09-25 LAB — TSH: TSH: 1.33 mIU/L (ref 0.40–4.50)

## 2017-10-02 ENCOUNTER — Other Ambulatory Visit: Payer: Self-pay

## 2017-10-02 DIAGNOSIS — Z1239 Encounter for other screening for malignant neoplasm of breast: Secondary | ICD-10-CM

## 2017-11-04 NOTE — Telephone Encounter (Signed)
copy sent to patient home address.

## 2017-11-07 ENCOUNTER — Ambulatory Visit: Admit: 2017-11-07 | Discharge: 2017-11-07 | Payer: PRIVATE HEALTH INSURANCE

## 2017-11-07 DIAGNOSIS — Z950 Presence of cardiac pacemaker: Secondary | ICD-10-CM

## 2017-11-15 NOTE — Telephone Encounter (Signed)
DATE OF CALL:  11/15/2017     I called Ms. Dandridge to discuss the recent remote device interrogation, which showed a 20-beat run of NSVT lasting about 7 seconds on 09/18/2017, correlating with an episode of lightheadedness:        She reports other brief and milder episodes of lightheadedness in the past, though none as intense as this episode.    I recommended she try taking the metoprolol 12.5 mg twice daily (which she had actually never started) and follow-up with me in 1-2 months.  If she has more frequent episodes, we will consider cardiac MRI and/or PET, particularly given the paroxysmal AV block and atrial fibrillation, to rule out signs of inflammatory myopathy such as sarcoidosis.      Alric Seton. Rachelle Hora, MD, [facility], [facility]  Associate Professor of Clinical Medicine  Division of Cardiology  Cardiac Electrophysiology

## 2017-11-22 ENCOUNTER — Other Ambulatory Visit: Payer: Self-pay | Admitting: Internal Medicine

## 2017-11-22 ENCOUNTER — Other Ambulatory Visit: Payer: Self-pay | Admitting: Nurse Practitioner

## 2017-11-22 DIAGNOSIS — I10 Essential (primary) hypertension: Secondary | ICD-10-CM

## 2017-11-22 MED FILL — LISINOPRIL 30 MG TABS: 30 | 90 days supply | Qty: 90 | Fill #0

## 2017-11-22 MED FILL — metFORMIN HCL 1000 MG TABS: 1000 | 90 days supply | Qty: 180 | Fill #0

## 2017-12-10 NOTE — Telephone Encounter (Signed)
Called Patient back regarding her appointment. Patient did not pick up the line and there was no answering machine. Patient would like to reschedule her appointments.

## 2017-12-12 ENCOUNTER — Other Ambulatory Visit: Payer: Self-pay | Admitting: Internal Medicine

## 2017-12-12 MED FILL — levETIRAcetam 500 MG TABS: 500 | 90 days supply | Qty: 180 | Fill #0

## 2017-12-16 ENCOUNTER — Ambulatory Visit (INDEPENDENT_AMBULATORY_CARE_PROVIDER_SITE_OTHER): Payer: 59 | Admitting: Internal Medicine

## 2017-12-16 ENCOUNTER — Encounter: Payer: Self-pay | Admitting: Internal Medicine

## 2017-12-16 VITALS — BP 138/80 | HR 94 | Temp 98.6°F | Ht 62.0 in | Wt 168.0 lb

## 2017-12-16 DIAGNOSIS — R6883 Chills (without fever): Secondary | ICD-10-CM

## 2017-12-16 DIAGNOSIS — D509 Iron deficiency anemia, unspecified: Secondary | ICD-10-CM

## 2017-12-16 DIAGNOSIS — R5383 Other fatigue: Secondary | ICD-10-CM

## 2017-12-16 NOTE — Progress Notes (Signed)
Location:  [facility] clinic Provider: Izamar Linden L. Mariea Clonts, D.O., C.M.D.  Goals of Care:  Advanced Directives 01/03/2017  Does Patient Have a Medical Advance Directive? No  Type of Advance Directive -  Does patient want to make changes to medical advance directive? -  Copy of [place] in Chart? -  Would patient like information on creating a medical advance directive? -     Chief Complaint  Patient presents with  . Acute Visit    fever off and on, fatigue x 2weeks    HPI: Patient is a 62 y.o. female patient of Dr. Eulas Post with h/o seizure disorder, HTN, DMII, constipation, iron deficiency anemia seen today for an acute visit for fever off and on and fatigue x 2wks.  She struggles in the morning, once moving she's ok, but near lunchtime, she startes feeling tired again.  Started a little over 2 wks ago with sneezing and a little coughing, some sore back.  She stoppe dand got benadryl, sinuses drained and she felt better.  Back pain was still sore.  Director was massaging her back and it went away.  By the end of the week she was more and more exhausted.  This week, she was too tired to go on.  She felt like someone stripped her of everything she had.  She was freezing and a little delirious Saturday.  She went to go downstairs to get her Pensions consultant.  Teeth were chattering.  As she was walking up the charger, she realized she didn't get the heating pad.  She grabbed blankets.  She was breathing really had.  Got upstairs and called her son.  Got back up and got everything situated and got under all her blankets.  She took some tylenol.  She felt warm.  She did start to feel better after that and was able to do some laundry.  Sunday, still very tired, but no fever--took alka seltzer nighttime which helped.  Fevers have come at night.  She never took her temperature.  She was then hot and sweaty.  She almost drove off the road driving Friday.  She is very tired and drained and feels  dehydrated.  Has been drinking bottles of water, but seems like she can't get enough.    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     Very little cough or drainage left.  She is a little hoarse when she wakes up.    Past Medical History:  Diagnosis Date  . Anemia, unspecified   . Asthma    as a child  . Diabetes mellitus    Type II  . History of blood transfusion    pre hysterectomy  . Osteoarthrosis, unspecified whether generalized or localized, unspecified site   . Other and unspecified hyperlipidemia   . Other convulsions    last one 2013  . Other malaise and fatigue   . Other specified cardiac dysrhythmias(427.89) 2006   "history of Bradycardia" .  wore a mointor nothing found  . Seizure disorder ([place])   . Unspecified essential hypertension   . Unspecified vitamin D deficiency     Past Surgical History:  Procedure Laterality Date  . ABDOMINAL HYSTERECTOMY    . BREAST LUMPECTOMY Right 1997  . CHOLECYSTECTOMY N/A 02/28/2016   Procedure: LAPAROSCOPIC CHOLECYSTECTOMY;  Surgeon: Mickeal Skinner, MD;  Location: [facility];  Service: General;  Laterality: N/A;  Allergies  Allergen Reactions  . Simvastatin Other (See Comments)    Stomach pain, low back pain   . Shrimp [Shellfish Allergy] Other (See Comments)    Swelling and hypersensitivity    Outpatient Encounter Medications as of 12/16/2017  Medication Sig  . ezetimibe (ZETIA) 10 MG tablet Take 1 tablet (10 mg total) by mouth daily.  . ferrous sulfate 325 (65 FE) MG tablet Take 1 tablet (325 mg total) by mouth 2 (two) times daily with a meal.  . glucose blood test strip Use as instructed  . levETIRAcetam (KEPPRA) 500 MG tablet TAKE ONE TABLET BY MOUTH EVERY 12 HOURS FOR SEIZURES  . lisinopril (PRINIVIL,ZESTRIL) 30 MG tablet TAKE 1 TABLET BY MOUTH ONCE DAILY FOR BLOOD PRESSURE  . metFORMIN (GLUCOPHAGE) 1000 MG tablet TAKE ONE TABLET BY MOUTH TWICE DAILY WITH  MEALS TO CONTROL BLOOD SUGAR   No facility-administered encounter medications on file as of 12/16/2017.     Review of Systems:  Review of Systems  Constitutional: Positive for chills, fever and malaise/fatigue. Negative for diaphoresis and weight loss.  HENT: Negative for congestion and sore throat.   Eyes: Negative for blurred vision.  Respiratory: Positive for cough. Negative for hemoptysis, sputum production, shortness of breath and wheezing.   Cardiovascular: Negative for chest pain, palpitations and leg swelling.  Gastrointestinal: Negative for abdominal pain, blood in stool, constipation, diarrhea, melena, nausea and vomiting.       Dark stool due to her iron intake  Genitourinary: Negative for dysuria, frequency and urgency.       Reports chronic "old lady bladder" but no changes  Musculoskeletal: Positive for back pain. Negative for myalgias.       Resolved now  Skin: Negative for rash.  Neurological: Negative for seizures, loss of consciousness and headaches.    Health Maintenance  Topic Date Due  . Hepatitis C Screening  1955/10/31  . OPHTHALMOLOGY EXAM  09/09/1965  . HIV Screening  09/10/1970  . COLONOSCOPY  09/09/2005  . FOOT EXAM  09/18/2017  . MAMMOGRAM  12/23/2017 (Originally 05/19/2011)  . INFLUENZA VACCINE  01/23/2018  . HEMOGLOBIN A1C  03/26/2018  . PAP SMEAR  08/10/2018  . TETANUS/TDAP  06/26/2019  . PNEUMOCOCCAL POLYSACCHARIDE VACCINE (2) 09/26/2021    Physical Exam: Vitals:   12/16/17 1516  BP: 138/80  Pulse: 94  Temp: 98.6 F (37 C)  TempSrc: Oral  SpO2: 97%  Weight: 168 lb (76.2 kg)  Height: 5\' 2"  (1.575 m)   Body mass index is 30.73 kg/m. Physical Exam  Constitutional: She is oriented to person, place, and time. No distress.  Appears extremely fatigued, sitting with head resting in hand, dark circles beneath eyes  HENT:  Head: Normocephalic and atraumatic.  Right Ear: External ear normal.  Left Ear: External ear normal.  Nose: Nose  normal.  Mouth/Throat: Oropharynx is clear and moist. No oropharyngeal exudate.  Eyes: Conjunctivae are normal.  Neck: Neck supple.  Cardiovascular: Normal rate, regular rhythm, normal heart sounds and intact distal pulses.  Pulmonary/Chest: Effort normal and breath sounds normal. No stridor. No respiratory distress. She has no wheezes. She has no rales.  Abdominal: Bowel sounds are normal.  Musculoskeletal: Normal range of motion.  Lymphadenopathy:    She has no cervical adenopathy.  Neurological: She is alert and oriented to person, place, and time.  Skin: Skin is warm and dry.    Labs reviewed: Basic Metabolic Panel: Recent Labs    01/03/17 1557 09/24/17 1603  NA 139 141  K 3.9 3.7  CL 106 106  CO2 22 27  GLUCOSE 98 99  BUN 11 12  CREATININE 0.86 0.85  CALCIUM 9.1 9.1  TSH  --  1.33   Liver Function Tests: Recent Labs    01/03/17 1557 09/24/17 1603  AST 16 20  ALT 15 20  ALKPHOS 70  --   BILITOT 0.3 0.3  PROT 7.2 7.0  ALBUMIN 4.3  --    No results for input(s): LIPASE, AMYLASE in the last 8760 hours. No results for input(s): AMMONIA in the last 8760 hours. CBC: Recent Labs    09/24/17 1603  WBC 7.0  NEUTROABS 3,675  HGB 11.4*  HCT 34.1*  MCV 78.4*  PLT 294   Lipid Panel: Recent Labs    01/03/17 1557 09/24/17 1603  CHOL 224* 181  HDL 66 57  LDLCALC 142* 97  TRIG 82 162*  CHOLHDL 3.4 3.2   Lab Results  Component Value Date   HGBA1C 6.8 (H) 09/24/2017    Assessment/Plan 1. Other fatigue - extreme--seems like due to either being off iron short term so levels low or more likely due to recent infection leaving her run down -encouraged continued hydration with water, but add electrolytes with vitamin water and improve po intake of food, rest -check labs - CBC with Differential/Platelet - COMPLETE METABOLIC PANEL WITH GFR - Iron, TIBC and Ferritin Panel - TSH  2. Iron deficiency anemia, unspecified iron deficiency anemia type - ongoing, but  last h/h very mildly low and has had anemia or whole life so seems less likely to be cause of fatigue now - Iron, TIBC and Ferritin Panel - TSH  3. Chills - ongoing, but afebrile here and no temps formally taken at home, but has had extreme cold and hot sensations improved with alka seltzer cold with tylenol in it - CBC with Differential/Platelet  Labs/tests ordered:   Orders Placed This Encounter  Procedures  . CBC with Differential/Platelet  . COMPLETE METABOLIC PANEL WITH GFR  . Iron, TIBC and Ferritin Panel  . TSH  CMA will call with labs when return  Next appt:  Prn and keep regular with Dr. Nelly Rout L. Laneka Mcgrory, D.O. [facility] [address], [place] [address] Cell Phone (Mon-Fri 8am-5pm):  [phone] On Call:  [phone] & follow prompts after 5pm & weekends Office Phone:  [phone] Office Fax:  [phone]

## 2017-12-16 NOTE — Patient Instructions (Signed)
Try to rest.  We will call you with your lab results.  Continue to hydrate with water and something else like vitamin water zero.

## 2017-12-17 ENCOUNTER — Encounter: Payer: Self-pay | Admitting: Internal Medicine

## 2017-12-17 ENCOUNTER — Other Ambulatory Visit: Payer: Self-pay

## 2017-12-17 ENCOUNTER — Inpatient Hospital Stay (HOSPITAL_COMMUNITY)
Admission: EM | Admit: 2017-12-17 | Discharge: 2017-12-22 | DRG: 372 | Disposition: A | Discharge status: Home / Self Care | Payer: 59 | Attending: Internal Medicine | Admitting: Internal Medicine

## 2017-12-17 ENCOUNTER — Inpatient Hospital Stay (HOSPITAL_COMMUNITY): Payer: 59

## 2017-12-17 ENCOUNTER — Emergency Department (HOSPITAL_COMMUNITY): Payer: 59

## 2017-12-17 DIAGNOSIS — D638 Anemia in other chronic diseases classified elsewhere: Secondary | ICD-10-CM | POA: Diagnosis present

## 2017-12-17 DIAGNOSIS — R05 Cough: Secondary | ICD-10-CM | POA: Diagnosis not present

## 2017-12-17 DIAGNOSIS — R5383 Other fatigue: Secondary | ICD-10-CM

## 2017-12-17 DIAGNOSIS — R748 Abnormal levels of other serum enzymes: Secondary | ICD-10-CM | POA: Diagnosis not present

## 2017-12-17 DIAGNOSIS — K3533 Acute appendicitis with perforation and localized peritonitis, with abscess: Principal | ICD-10-CM | POA: Diagnosis present

## 2017-12-17 DIAGNOSIS — Z79899 Other long term (current) drug therapy: Secondary | ICD-10-CM | POA: Diagnosis not present

## 2017-12-17 DIAGNOSIS — E872 Acidosis: Secondary | ICD-10-CM | POA: Diagnosis not present

## 2017-12-17 DIAGNOSIS — E785 Hyperlipidemia, unspecified: Secondary | ICD-10-CM | POA: Diagnosis present

## 2017-12-17 DIAGNOSIS — G40909 Epilepsy, unspecified, not intractable, without status epilepticus: Secondary | ICD-10-CM | POA: Diagnosis not present

## 2017-12-17 DIAGNOSIS — E119 Type 2 diabetes mellitus without complications: Secondary | ICD-10-CM | POA: Diagnosis not present

## 2017-12-17 DIAGNOSIS — M545 Low back pain: Secondary | ICD-10-CM | POA: Diagnosis not present

## 2017-12-17 DIAGNOSIS — N179 Acute kidney failure, unspecified: Secondary | ICD-10-CM | POA: Diagnosis not present

## 2017-12-17 DIAGNOSIS — N178 Other acute kidney failure: Secondary | ICD-10-CM

## 2017-12-17 DIAGNOSIS — E876 Hypokalemia: Secondary | ICD-10-CM | POA: Diagnosis present

## 2017-12-17 DIAGNOSIS — D509 Iron deficiency anemia, unspecified: Secondary | ICD-10-CM | POA: Diagnosis not present

## 2017-12-17 DIAGNOSIS — J45909 Unspecified asthma, uncomplicated: Secondary | ICD-10-CM | POA: Diagnosis present

## 2017-12-17 DIAGNOSIS — K3532 Acute appendicitis with perforation and localized peritonitis, without abscess: Secondary | ICD-10-CM | POA: Diagnosis not present

## 2017-12-17 DIAGNOSIS — I10 Essential (primary) hypertension: Secondary | ICD-10-CM | POA: Diagnosis not present

## 2017-12-17 DIAGNOSIS — D72829 Elevated white blood cell count, unspecified: Secondary | ICD-10-CM | POA: Diagnosis present

## 2017-12-17 DIAGNOSIS — Z7984 Long term (current) use of oral hypoglycemic drugs: Secondary | ICD-10-CM | POA: Diagnosis not present

## 2017-12-17 DIAGNOSIS — I517 Cardiomegaly: Secondary | ICD-10-CM

## 2017-12-17 DIAGNOSIS — K651 Peritoneal abscess: Secondary | ICD-10-CM | POA: Diagnosis not present

## 2017-12-17 DIAGNOSIS — R109 Unspecified abdominal pain: Secondary | ICD-10-CM | POA: Diagnosis not present

## 2017-12-17 LAB — URINALYSIS, ROUTINE W REFLEX MICROSCOPIC
BILIRUBIN URINE: NEGATIVE
GLUCOSE, UA: NEGATIVE mg/dL
HGB URINE DIPSTICK: NEGATIVE
Ketones, ur: NEGATIVE mg/dL
LEUKOCYTES UA: NEGATIVE
NITRITE: NEGATIVE
PH: 5 (ref 5.0–8.0)
Protein, ur: 100 mg/dL — AB
Specific Gravity, Urine: 1.027 (ref 1.005–1.030)

## 2017-12-17 LAB — CBC WITH DIFFERENTIAL/PLATELET
Basophils Absolute: 68 cells/uL (ref 0–200)
Basophils Relative: 0.4 %
Eosinophils Absolute: 357 cells/uL (ref 15–500)
Eosinophils Relative: 2.1 %
HCT: 33.1 % — ABNORMAL LOW (ref 35.0–45.0)
Hemoglobin: 10.7 g/dL — ABNORMAL LOW (ref 11.7–15.5)
Lymphs Abs: 2023 cells/uL (ref 850–3900)
MCH: 25.3 pg — ABNORMAL LOW (ref 27.0–33.0)
MCHC: 32.3 g/dL (ref 32.0–36.0)
MCV: 78.3 fL — ABNORMAL LOW (ref 80.0–100.0)
MPV: 10.9 fL (ref 7.5–12.5)
Monocytes Relative: 8.1 %
Neutro Abs: 13175 cells/uL — ABNORMAL HIGH (ref 1500–7800)
Neutrophils Relative %: 77.5 %
Platelets: 313 10*3/uL (ref 140–400)
RBC: 4.23 10*6/uL (ref 3.80–5.10)
RDW: 13.8 % (ref 11.0–15.0)
Total Lymphocyte: 11.9 %
WBC mixed population: 1377 cells/uL — ABNORMAL HIGH (ref 200–950)
WBC: 17 10*3/uL — ABNORMAL HIGH (ref 3.8–10.8)

## 2017-12-17 LAB — COMPLETE METABOLIC PANEL WITH GFR
AG Ratio: 1 (calc) (ref 1.0–2.5)
ALT: 56 U/L — ABNORMAL HIGH (ref 6–29)
AST: 40 U/L — ABNORMAL HIGH (ref 10–35)
Albumin: 3.5 g/dL — ABNORMAL LOW (ref 3.6–5.1)
Alkaline phosphatase (APISO): 182 U/L — ABNORMAL HIGH (ref 33–130)
BUN/Creatinine Ratio: 16 (calc) (ref 6–22)
BUN: 42 mg/dL — ABNORMAL HIGH (ref 7–25)
CO2: 21 mmol/L (ref 20–32)
Calcium: 8.9 mg/dL (ref 8.6–10.4)
Chloride: 103 mmol/L (ref 98–110)
Creat: 2.68 mg/dL — ABNORMAL HIGH (ref 0.50–0.99)
GFR, Est African American: 21 mL/min/{1.73_m2} — ABNORMAL LOW (ref >=60)
GFR, Est Non African American: 18 mL/min/{1.73_m2} — ABNORMAL LOW (ref >=60)
Globulin: 3.5 g/dL (calc) (ref 1.9–3.7)
Glucose, Bld: 173 mg/dL — ABNORMAL HIGH (ref 65–139)
Potassium: 4.3 mmol/L (ref 3.5–5.3)
Sodium: 137 mmol/L (ref 135–146)
Total Bilirubin: 0.3 mg/dL (ref 0.2–1.2)
Total Protein: 7 g/dL (ref 6.1–8.1)

## 2017-12-17 LAB — COMPREHENSIVE METABOLIC PANEL
ALT: 61 U/L — ABNORMAL HIGH (ref 0–44)
AST: 49 U/L — AB (ref 15–41)
Albumin: 2.8 g/dL — ABNORMAL LOW (ref 3.5–5.0)
Alkaline Phosphatase: 190 U/L — ABNORMAL HIGH (ref 38–126)
Anion gap: 10 (ref 5–15)
BILIRUBIN TOTAL: 0.6 mg/dL (ref 0.3–1.2)
BUN: 32 mg/dL — AB (ref 8–23)
CO2: 21 mmol/L — ABNORMAL LOW (ref 22–32)
Calcium: 9.2 mg/dL (ref 8.9–10.3)
Chloride: 107 mmol/L (ref 98–111)
Creatinine, Ser: 1.85 mg/dL — ABNORMAL HIGH (ref 0.44–1.00)
GFR, EST AFRICAN AMERICAN: 33 mL/min — AB (ref >60)
GFR, EST NON AFRICAN AMERICAN: 28 mL/min — AB (ref >60)
Glucose, Bld: 188 mg/dL — ABNORMAL HIGH (ref 70–99)
Potassium: 4.6 mmol/L (ref 3.5–5.1)
Sodium: 138 mmol/L (ref 135–145)
TOTAL PROTEIN: 7.3 g/dL (ref 6.5–8.1)

## 2017-12-17 LAB — CBC
HCT: 35.4 % — ABNORMAL LOW (ref 36.0–46.0)
Hemoglobin: 11 g/dL — ABNORMAL LOW (ref 12.0–15.0)
MCH: 25.1 pg — ABNORMAL LOW (ref 26.0–34.0)
MCHC: 31.1 g/dL (ref 30.0–36.0)
MCV: 80.8 fL (ref 78.0–100.0)
PLATELETS: 298 10*3/uL (ref 150–400)
RBC: 4.38 MIL/uL (ref 3.87–5.11)
RDW: 14.1 % (ref 11.5–15.5)
WBC: 15.4 10*3/uL — AB (ref 4.0–10.5)

## 2017-12-17 LAB — GLUCOSE, CAPILLARY
GLUCOSE-CAPILLARY: 137 mg/dL — AB (ref 70–99)
Glucose-Capillary: 120 mg/dL — ABNORMAL HIGH (ref 70–99)

## 2017-12-17 LAB — IRON,TIBC AND FERRITIN PANEL
%SAT: 10 % (calc) — ABNORMAL LOW (ref 16–45)
Ferritin: 170 ng/mL (ref 16–288)
Iron: 22 ug/dL — ABNORMAL LOW (ref 45–160)
TIBC: 229 mcg/dL (calc) — ABNORMAL LOW (ref 250–450)

## 2017-12-17 LAB — I-STAT TROPONIN, ED: TROPONIN I, POC: 0.07 ng/mL (ref 0.00–0.08)

## 2017-12-17 LAB — TSH: TSH: 1.29 mIU/L (ref 0.40–4.50)

## 2017-12-17 LAB — LACTIC ACID, PLASMA: Lactic Acid, Venous: 0.8 mmol/L (ref 0.5–1.9)

## 2017-12-17 LAB — PROTIME-INR
INR: 1.02
PROTHROMBIN TIME: 13.3 s (ref 11.4–15.2)

## 2017-12-17 LAB — LIPASE, BLOOD: Lipase: 25 U/L (ref 11–51)

## 2017-12-17 MED ORDER — ONDANSETRON HCL 4 MG/2ML IJ SOLN: 4.0000 mg | Freq: Four times a day (QID) | INTRAMUSCULAR | Status: DC | PRN

## 2017-12-17 MED ORDER — BISACODYL 10 MG RE SUPP: 10.0000 mg | Freq: Every day | RECTAL | Status: DC | PRN

## 2017-12-17 MED ORDER — ACETAMINOPHEN 650 MG RE SUPP: 650.0000 mg | Freq: Four times a day (QID) | RECTAL | Status: DC | PRN

## 2017-12-17 MED ORDER — ENSURE ENLIVE PO LIQD
237.0000 mL | Freq: Two times a day (BID) | ORAL | Status: DC
  Administered 2017-12-17 – 2017-12-20 (×3): 237 mL via ORAL

## 2017-12-17 MED ORDER — LEVETIRACETAM 500 MG PO TABS
500.0000 mg | ORAL_TABLET | Freq: Two times a day (BID) | ORAL | Status: DC
  Administered 2017-12-17 – 2017-12-22 (×10): 500 mg via ORAL
  Filled 2017-12-17 (×10): qty 1

## 2017-12-17 MED ORDER — FERROUS SULFATE 325 (65 FE) MG PO TABS
325.0000 mg | ORAL_TABLET | Freq: Two times a day (BID) | ORAL | Status: DC
  Administered 2017-12-17 – 2017-12-22 (×10): 325 mg via ORAL
  Filled 2017-12-17 (×12): qty 1

## 2017-12-17 MED ORDER — INSULIN ASPART 100 UNIT/ML ~~LOC~~ SOLN
0.0000 [IU] | Freq: Three times a day (TID) | SUBCUTANEOUS | Status: DC
  Administered 2017-12-18 – 2017-12-20 (×2): 2 [IU] via SUBCUTANEOUS
  Administered 2017-12-20: 3 [IU] via SUBCUTANEOUS
  Administered 2017-12-20: 2 [IU] via SUBCUTANEOUS
  Administered 2017-12-21: 3 [IU] via SUBCUTANEOUS

## 2017-12-17 MED ORDER — EZETIMIBE 10 MG PO TABS: 10.0000 mg | ORAL_TABLET | Freq: Every day | ORAL | Status: DC

## 2017-12-17 MED ORDER — SENNOSIDES-DOCUSATE SODIUM 8.6-50 MG PO TABS: 1.0000 | ORAL_TABLET | Freq: Every evening | ORAL | Status: DC | PRN

## 2017-12-17 MED ORDER — SODIUM CHLORIDE 0.9 % IV BOLUS
1000.0000 mL | Freq: Once | INTRAVENOUS | Status: AC
  Administered 2017-12-17: 1000 mL via INTRAVENOUS

## 2017-12-17 MED ORDER — SODIUM CHLORIDE 0.9 % IV SOLN
INTRAVENOUS | Status: DC
  Administered 2017-12-17 – 2017-12-22 (×10): via INTRAVENOUS

## 2017-12-17 MED ORDER — HEPARIN SODIUM (PORCINE) 5000 UNIT/ML IJ SOLN
5000.0000 [IU] | Freq: Three times a day (TID) | INTRAMUSCULAR | Status: DC
  Administered 2017-12-17 – 2017-12-18 (×3): 5000 [IU] via SUBCUTANEOUS
  Filled 2017-12-17 (×3): qty 1

## 2017-12-17 MED ORDER — ACETAMINOPHEN 325 MG PO TABS
650.0000 mg | ORAL_TABLET | Freq: Four times a day (QID) | ORAL | Status: DC | PRN
  Administered 2017-12-18: 650 mg via ORAL
  Filled 2017-12-17: qty 2

## 2017-12-17 MED ORDER — INSULIN ASPART 100 UNIT/ML ~~LOC~~ SOLN: 0.0000 [IU] | Freq: Three times a day (TID) | SUBCUTANEOUS | Status: DC

## 2017-12-17 MED ORDER — INSULIN ASPART 100 UNIT/ML ~~LOC~~ SOLN: 0.0000 [IU] | Freq: Every day | SUBCUTANEOUS | Status: DC

## 2017-12-17 MED ORDER — HYDRALAZINE HCL 20 MG/ML IJ SOLN
5.0000 mg | Freq: Three times a day (TID) | INTRAMUSCULAR | Status: DC | PRN
  Administered 2017-12-18: 5 mg via INTRAVENOUS
  Filled 2017-12-17: qty 1

## 2017-12-17 MED ORDER — ONDANSETRON HCL 4 MG PO TABS: 4.0000 mg | ORAL_TABLET | Freq: Four times a day (QID) | ORAL | Status: DC | PRN

## 2017-12-17 MED ORDER — HYDROCODONE-ACETAMINOPHEN 5-325 MG PO TABS
1.0000 | ORAL_TABLET | ORAL | Status: DC | PRN
  Administered 2017-12-17 – 2017-12-22 (×8): 1 via ORAL
  Filled 2017-12-17 (×8): qty 1

## 2017-12-17 NOTE — ED Triage Notes (Signed)
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 Coworkers encouraged pt to see PMD yesterday who drew labs concerning for acute renal failure, elevated WBC they sent pt here for further management. Pt states she's had some SOB, reports recent fever Saturday. Pt awake, alert, appropriate. Pt ambulatory to room.

## 2017-12-17 NOTE — ED Notes (Signed)
Got patient into a gown on the monitor patient is resting with call bell in reach 

## 2017-12-17 NOTE — ED Notes (Signed)
Pt transported to xray 

## 2017-12-17 NOTE — ED Notes (Signed)
Pt ambulatory to RR w steady gait

## 2017-12-17 NOTE — ED Notes (Signed)
Pt rectal temp. 98.5.

## 2017-12-17 NOTE — H&P (Addendum)
History and Physical    Sarah Brock ZOX:[id number] DOB: 10-Mar-1956 DOA: 12/17/2017   PCP: Gildardo Cranker, DO   Patient coming from:  Home    Chief Complaint: "I don't feel well"  HPI: Sarah Brock is a 62 y.o. female diabetes, history of iron deficiency anemia, asthma, seizure disorder on Keppra, presenting with 2 to 3-week history of progressive worsening fatigue, and malaise, treated by upper respiratory infection felt to be viral.  About 2 weeks ago, the patient began to experience sharp lower back pain for which she has been taking significant amount of NSAIDs with Motrin, and she also takes naproxen twice daily 500 mg for "muscle aches ".  For about 1 week, she noted changes in the color of her urine, dysuria without urgency and frequency.  She denies any nausea or vomiting or diarrhea she has subjective fevers, chills, but denies any cough.  She denies any chest pain or palpitations.  She denies any increasing shortness of breath or wheezing She denies any gait instability.  She denies any unilateral weakness.  She denies any lower extremity swelling.  Denies any tobacco, alcohol or recreational drug use.  She does take ACE inhibitor daily dose this morning.  She was seen by her PCP yesterday, who noted that her creatinine was 2.68, and asked her to be seen at the emergency department.  Her coworkers today, suggested that she be seen, in view of her symptoms, and these abnormal labs.   ED Course:  BP (!) 150/86   Pulse 86   Temp 98.6 F (37 C) (Oral) Comment: Simultaneous filing. User may not have seen previous data.  Resp (!) 24   Ht 5\' 2"  (1.575 m)   Wt 76.2 kg (168 lb)   SpO2 99%   BMI 30.73 kg/m   At the ED, the patient was noted to have a creatinine of 1.85 (was 2.68 yesterday), with a BUN of 32, was 42 yesterday Alkaline phosphatase 190 Total bilirubin 0.6, AST 49, ALT 61 Glucose 188 White count 15.4 was 17 yesterday  Iron studies yesterday show iron of 22, TIBC  229, percentage saturation 10, ferritin 170 Chest x-ray EKG shows sinus rhythm, probable left atrial enlargement, and ST elevation, which are new from EKG in 2017 2D echo in 2011 was normal, with normal EF. Chest x-ray was remarkable for mild cardiomegaly Troponin is pending   Review of Systems:  As per HPI otherwise all other systems reviewed and are negative  Past Medical History:  Diagnosis Date  . Anemia, unspecified   . Asthma    as a child  . Diabetes mellitus    Type II  . History of blood transfusion    pre hysterectomy  . Osteoarthrosis, unspecified whether generalized or localized, unspecified site   . Other and unspecified hyperlipidemia   . Other convulsions    last one 2013  . Other malaise and fatigue   . Other specified cardiac dysrhythmias(427.89) 2006   "history of Bradycardia" .  wore a mointor nothing found  . Seizure disorder ([place])   . Unspecified essential hypertension   . Unspecified vitamin D deficiency     Past Surgical History:  Procedure Laterality Date  . ABDOMINAL HYSTERECTOMY    . BREAST LUMPECTOMY Right 1997  . CHOLECYSTECTOMY N/A 02/28/2016   Procedure: LAPAROSCOPIC CHOLECYSTECTOMY;  Surgeon: Arta Bruce Kinsinger, MD;  Location: [facility] OR;  Service: General;  Laterality: N/A;    Social History Social History   Socioeconomic History  .  Marital status: Widowed    Spouse name: Not on file  . Number of children: Not on file  . Years of education: Not on file  . Highest education level: Not on file  Occupational History  . Not on file  Social Needs  . Financial resource strain: Not on file  . Food insecurity:    Worry: Not on file    Inability: Not on file  . Transportation needs:    Medical: Not on file    Non-medical: Not on file  Tobacco Use  . Smoking status: Never Smoker  . Smokeless tobacco: Never Used  Substance and Sexual Activity  . Alcohol use: No    Alcohol/week: 0.0 oz    Comment: rarely- none on years  . Drug use: No    . Sexual activity: Not Currently  Lifestyle  . Physical activity:    Days per week: Not on file    Minutes per session: Not on file  . Stress: Not on file  Relationships  . Social connections:    Talks on phone: Not on file    Gets together: Not on file    Attends religious service: Not on file    Active member of club or organization: Not on file    Attends meetings of clubs or organizations: Not on file    Relationship status: Not on file  . Intimate partner violence:    Fear of current or ex partner: Not on file    Emotionally abused: Not on file    Physically abused: Not on file    Forced sexual activity: Not on file  Other Topics Concern  . Not on file  Social History Narrative  . Not on file     Allergies  Allergen Reactions  . Simvastatin Other (See Comments)    Stomach pain, low back pain   . Shrimp [Shellfish Allergy] Other (See Comments)    Swelling and hypersensitivity    Family History  Problem Relation Age of Onset  . Cancer Father        Esophageal       Prior to Admission medications   Medication Sig Start Date End Date Taking? Authorizing Provider  cetirizine (ZYRTEC) 10 MG tablet Take 10 mg by mouth at bedtime.   Yes [provider]  ezetimibe (ZETIA) 10 MG tablet Take 1 tablet (10 mg total) by mouth daily. 01/07/17  Yes Lauree Chandler, NP  ferrous sulfate 325 (65 FE) MG tablet Take 1 tablet (325 mg total) by mouth 2 (two) times daily with a meal. 02/26/14  Yes Bubba Camp, Mahima, MD  levETIRAcetam (KEPPRA) 500 MG tablet TAKE ONE TABLET BY MOUTH EVERY 12 HOURS FOR SEIZURES 12/12/17  Yes Lauree Chandler, NP  lisinopril (PRINIVIL,ZESTRIL) 30 MG tablet TAKE 1 TABLET BY MOUTH ONCE DAILY FOR BLOOD PRESSURE Patient taking differently: Take 30 mg by mouth daily.  11/22/17  Yes Eulas Post, Monica, DO  metFORMIN (GLUCOPHAGE) 1000 MG tablet TAKE ONE TABLET BY MOUTH TWICE DAILY WITH MEALS TO CONTROL BLOOD SUGAR 11/22/17  Yes Eulas Post, Monica, DO  naproxen  sodium (ALEVE) 220 MG tablet Take 440 mg by mouth 2 (two) times daily as needed (back pain).   Yes [provider]  glucose blood test strip Use as instructed 02/24/14   Blanchie Serve, MD     Physical Exam:  Vitals:   12/17/17 1100 12/17/17 1115 12/17/17 1200 12/17/17 1215  BP: (!) 162/98 (!) 161/93 (!) 143/76 (!) 150/86  Pulse: 82 82  80 86  Resp: (!) 31 (!) 22 16 (!) 24  Temp:      TempSrc:      SpO2: 98% 99% 99% 99%  Weight:      Height:       Constitutional: NAD, calm, comfortable  Eyes: PERRL, lids and conjunctivae normal ENMT: Mucous membranes are moist, without exudate or lesions  Neck: normal, supple, no masses, no thyromegaly Respiratory: clear to auscultation bilaterally, no wheezing, no crackles. Normal respiratory effort  Cardiovascular: Regular rate and rhythm,  murmur, rubs or gallops. No extremity edema. 2+ pedal pulses. No carotid bruits.  Abdomen: Soft, non tender, CVAT R No hepatosplenomegaly. Bowel sounds positive.  Musculoskeletal: no clubbing / cyanosis. Moves all extremities Skin: no jaundice, No lesions.  Neurologic: Sensation intact  Strength equal in all extremities Psychiatric:   Alert and oriented x 3. Normal mood.     Labs on Admission: I have personally reviewed following labs and imaging studies  CBC: Recent Labs  Lab 12/16/17 1600 12/17/17 1136  WBC 17.0* 15.4*  NEUTROABS 13,175*  --   HGB 10.7* 11.0*  HCT 33.1* 35.4*  MCV 78.3* 80.8  PLT 313 939    Basic Metabolic Panel: Recent Labs  Lab 12/16/17 1600 12/17/17 1136  NA 137 138  K 4.3 4.6  CL 103 107  CO2 21 21*  GLUCOSE 173* 188*  BUN 42* 32*  CREATININE 2.68* 1.85*  CALCIUM 8.9 9.2    GFR: Estimated Creatinine Clearance: 30.1 mL/min (A) (by C-G formula based on SCr of 1.85 mg/dL (H)).  Liver Function Tests: Recent Labs  Lab 12/16/17 1600 12/17/17 1136  AST 40* 49*  ALT 56* 61*  ALKPHOS  --  190*  BILITOT 0.3 0.6  PROT 7.0 7.3  ALBUMIN  --  2.8*   No  results for input(s): LIPASE, AMYLASE in the last 168 hours. No results for input(s): AMMONIA in the last 168 hours.  Coagulation Profile: No results for input(s): INR, PROTIME in the last 168 hours.  Cardiac Enzymes: No results for input(s): CKTOTAL, CKMB, CKMBINDEX, TROPONINI in the last 168 hours.  BNP (last 3 results) No results for input(s): PROBNP in the last 8760 hours.  HbA1C: No results for input(s): HGBA1C in the last 72 hours.  CBG: No results for input(s): GLUCAP in the last 168 hours.  Lipid Profile: No results for input(s): CHOL, HDL, LDLCALC, TRIG, CHOLHDL, LDLDIRECT in the last 72 hours.  Thyroid Function Tests: Recent Labs    12/16/17 1600  TSH 1.29    Anemia Panel: Recent Labs    12/16/17 1600  FERRITIN 170  TIBC 229*  IRON 22*    Urine analysis:    Component Value Date/Time   COLORURINE AMBER (A) 12/17/2017 1027   APPEARANCEUR HAZY (A) 12/17/2017 1027   LABSPEC 1.027 12/17/2017 1027   PHURINE 5.0 12/17/2017 1027   GLUCOSEU NEGATIVE 12/17/2017 1027   HGBUR NEGATIVE 12/17/2017 1027   BILIRUBINUR NEGATIVE 12/17/2017 1027   KETONESUR NEGATIVE 12/17/2017 1027   PROTEINUR 100 (A) 12/17/2017 1027   UROBILINOGEN 0.2 05/07/2011 1227   NITRITE NEGATIVE 12/17/2017 1027   LEUKOCYTESUR NEGATIVE 12/17/2017 1027    Sepsis Labs: @LABRCNTIP (procalcitonin:4,lacticidven:4) )No results found for this or any previous visit (from the past 240 hour(s)).   Radiological Exams on Admission: Dg Chest 2 View  Result Date: 12/17/2017 CLINICAL DATA:  Cough.  Chest discomfort. EXAM: CHEST - 2 VIEW COMPARISON:  12/18/2015 FINDINGS: There is new mild cardiomegaly. Pulmonary vascularity is normal and the  lungs are clear. No effusions. Bones appear normal. IMPRESSION: New mild cardiomegaly. Electronically Signed   By: Lorriane Shire M.D.   On: 12/17/2017 11:24    EKG: Independently reviewed.  Assessment/Plan Principal Problem:   Acute renal failure (ARF)  (HCC) Active Problems:   Seizure disorder (HCC)   Diabetes mellitus type 2, controlled, without complications (HCC)   Anemia, iron deficiency   Asthma     Acute Kidney Failure  likely due to dehydration vs.ACEI, vs NSAIDS versus infection versus combination of the above, BL Cr  0.85, was highest at 2.85 yesterday, now at 1.85.  Urinalysis neg for nitrites or leukocytes  received 1 L IVF  Lab Results  Component Value Date   CREATININE 1.85 (H) 12/17/2017   CREATININE 2.68 (H) 12/16/2017   CREATININE 0.85 09/24/2017  IVF BMET in am   Hold Ace inhibitors and diuretics  CT abdomen and pelvis   Leukocytosis,rule out infectious, elevated neutrophils and lymphs  WBC  15 were17 yesterday  Afebrile. NO active disease on CXR . UA neg for nitrities or leuk    IVF   Repeat CBC in AM Blood culture  Check smear      Mild  LFTs elevation  likely due to dehydration.  No history of  Hepatitis.  Alkaline phosphatase 190. Total bilirubin 0.6, AST 49, ALT 61, when compared to prior day is slightly worse  HIV Repeat LFTs in am  Hepatitis panel IVF      Hypertension BP 159/90   Pulse 82    Hold home anti-hypertensive medications  Add Hydralazine Q6 hours as needed  Hyperlipidemia Continue home Zetia  Type II Diabetes Current blood sugar level is 188 Lab Results  Component Value Date   HGBA1C 6.8 (H) 09/24/2017   Hold home oral diabetic medications.  SSI   Anemia of chronic disease Hemoglobin on admission 11 at baseline. Latest Iron studies on 6/24 Iron 22 with Ferritin of 170 . Denies pica  Hcult   Repeat CBC in am  No transfusion is indicated at this time Continue oral iron for now   Abnormal EKG and mild cardiomegaly on CXR .   EKG shows sinus rhythm, probable left atrial enlargement, and ST elevation, which are new from EKG in 2017. TN pending . No CP or palpitations. Denies cardiac history  .   Echo complete  Repeat EKG in am  Check BNP  History of Seizures, on  Keppra Continue present therapy with Keppra    DVT prophylaxis:  Hep sq  Code Status:    Full  Family Communication:  Discussed with patient Disposition Plan: Expect patient to be discharged to home after condition improves Consults called:    None  Admission status: Tele IP    Sharene Butters, PA-C [facility]   Amion text  [phone]   12/17/2017, 1:42 PM

## 2017-12-17 NOTE — ED Provider Notes (Addendum)
[facility] EMERGENCY DEPARTMENT Provider Note   CSN: [id number] Arrival date & time: 12/17/17  4562     History   Chief Complaint Chief Complaint  Patient presents with  . Weakness    HPI Sarah Brock is a 62 y.o. female.  HPI  Patient is a 62 y.o. female with a history of T2 DM, anemia, asthma, seizure disorder (on Keppra) presenting for generalized fatigue.  Patient also sent by primary care provider due to abnormal labs.  Patient reports that over the last 2 to 3 weeks, she has had progressively worsening exhaustion as well as a series of other symptoms that have resolved at present.  Patient reports that 3 weeks ago, she began having significant sinus drainage and took Benadryl daily.  Once is resolved, patient reports that she has severe and sharp lower back pain, worse on the right side approximately 1.5 weeks ago they would resolve with heat massage.  Patient reports that her low back symptoms are now resolved, and she did not have any ambulation difficulties, weakness or numbness in extremities.  Patient also notes that around that time she developed a nonproductive cough.  Patient also had rigors 4 days ago but did not develop a fever.  At present, patient reporting that she mainly feels generalized weakness and fatigue that prevents her from doing activities of daily living in the morning.  Patient denies any decreased urination, flank pain, discoloration of urine, dysuria, urgency, frequency.  Patient does note that over the past 2 to 3 years she has steadily increased her intake of nonsteroidal medications, and she will typically take naproxen twice daily about 500 mg for muscle aches and pain.  Patient is also on lisinopril.  Past Medical History:  Diagnosis Date  . Anemia, unspecified   . Asthma    as a child  . Diabetes mellitus    Type II  . History of blood transfusion    pre hysterectomy  . Osteoarthrosis, unspecified whether generalized or  localized, unspecified site   . Other and unspecified hyperlipidemia   . Other convulsions    last one 2013  . Other malaise and fatigue   . Other specified cardiac dysrhythmias(427.89) 2006   "history of Bradycardia" .  wore a mointor nothing found  . Seizure disorder ([place])   . Unspecified essential hypertension   . Unspecified vitamin D deficiency     Patient Active Problem List   Diagnosis Date Noted  . Anemia, iron deficiency 05/19/2014  . Essential hypertension 05/19/2014  . Annual physical exam 05/19/2014  . Unspecified constipation 04/15/2013  . Diabetes mellitus type 2, controlled, without complications ([place]) 56/38/9373  . Seizure disorder ([place])   . Anemia, unspecified   . Unspecified essential hypertension     Past Surgical History:  Procedure Laterality Date  . ABDOMINAL HYSTERECTOMY    . BREAST LUMPECTOMY Right 1997  . CHOLECYSTECTOMY N/A 02/28/2016   Procedure: LAPAROSCOPIC CHOLECYSTECTOMY;  Surgeon: Arta Bruce Kinsinger, MD;  Location: [facility];  Service: General;  Laterality: N/A;     OB History   None      Home Medications    Prior to Admission medications   Medication Sig Start Date End Date Taking? Authorizing Provider  ezetimibe (ZETIA) 10 MG tablet Take 1 tablet (10 mg total) by mouth daily. 01/07/17   Lauree Chandler, NP  ferrous sulfate 325 (65 FE) MG tablet Take 1 tablet (325 mg total) by mouth 2 (two) times daily with a meal.  02/26/14   Blanchie Serve, MD  glucose blood test strip Use as instructed 02/24/14   Blanchie Serve, MD  levETIRAcetam (KEPPRA) 500 MG tablet TAKE ONE TABLET BY MOUTH EVERY 12 HOURS FOR SEIZURES 12/12/17   Lauree Chandler, NP  lisinopril (PRINIVIL,ZESTRIL) 30 MG tablet TAKE 1 TABLET BY MOUTH ONCE DAILY FOR BLOOD PRESSURE 11/22/17   Gildardo Cranker, DO  metFORMIN (GLUCOPHAGE) 1000 MG tablet TAKE ONE TABLET BY MOUTH TWICE DAILY WITH MEALS TO CONTROL BLOOD SUGAR 11/22/17   Gildardo Cranker, DO    Family History Family History    Problem Relation Age of Onset  . Cancer Father        Esophageal    Social History Social History   Tobacco Use  . Smoking status: Never Smoker  . Smokeless tobacco: Never Used  Substance Use Topics  . Alcohol use: No    Alcohol/week: 0.0 oz    Comment: rarely- none on years  . Drug use: No     Allergies   Simvastatin and Shrimp [shellfish allergy]   Review of Systems Review of Systems  Constitutional: Positive for activity change, appetite change, chills and fever.  HENT: Negative for congestion and rhinorrhea.   Eyes: Negative for visual disturbance.  Respiratory: Positive for cough. Negative for wheezing.   Cardiovascular: Negative for chest pain, palpitations and leg swelling.  Gastrointestinal: Negative for abdominal pain, nausea and vomiting.  Genitourinary: Negative for decreased urine volume, difficulty urinating, dysuria and hematuria.  Musculoskeletal: Positive for arthralgias, back pain and myalgias. Negative for joint swelling, neck pain and neck stiffness.  Skin: Negative for rash.  Neurological: Positive for weakness.       +Generlized weakness     Physical Exam Updated Vital Signs BP (!) 158/88 (BP Location: Right Arm) Comment: Simultaneous filing. User may not have seen previous data.  Pulse 91 Comment: Simultaneous filing. User may not have seen previous data.  Temp 98.6 F (37 C) (Oral) Comment: Simultaneous filing. User may not have seen previous data.  Resp 20 Comment: Simultaneous filing. User may not have seen previous data.  Ht 5\' 2"  (1.575 m)   Wt 76.2 kg (168 lb)   SpO2 100% Comment: Simultaneous filing. User may not have seen previous data.  BMI 30.73 kg/m   Physical Exam  Constitutional: She appears well-developed and well-nourished. No distress.  HENT:  Head: Normocephalic and atraumatic.  Mouth/Throat: Oropharynx is clear and moist.  Eyes: Pupils are equal, round, and reactive to light. Conjunctivae and EOM are normal.  Neck:  Normal range of motion. Neck supple.  Cardiovascular: Normal rate, regular rhythm, S1 normal, S2 normal and intact distal pulses.  No murmur heard. Intact, 2+ pedal pulses.  Pulmonary/Chest: Effort normal and breath sounds normal. She has no wheezes. She has no rales.  Abdominal: Soft. She exhibits no distension. There is no tenderness. There is no guarding.  Musculoskeletal: Normal range of motion. She exhibits no edema or deformity.  Lymphadenopathy:    She has no cervical adenopathy.  Neurological: She is alert.  Cranial nerves grossly intact. Patient moves extremities symmetrically and with good coordination.  Skin: Skin is warm and dry. No rash noted. No erythema.  Psychiatric: She has a normal mood and affect. Her behavior is normal. Judgment and thought content normal.  Nursing note and vitals reviewed.    ED Treatments / Results  Labs (all labs ordered are listed, but only abnormal results are displayed) Labs Reviewed  CBC - Abnormal; Notable for the following components:  Result Value   WBC 15.4 (*)    Hemoglobin 11.0 (*)    HCT 35.4 (*)    MCH 25.1 (*)    All other components within normal limits  URINALYSIS, ROUTINE W REFLEX MICROSCOPIC - Abnormal; Notable for the following components:   Color, Urine AMBER (*)    APPearance HAZY (*)    Protein, ur 100 (*)    Bacteria, UA RARE (*)    All other components within normal limits  COMPREHENSIVE METABOLIC PANEL - Abnormal; Notable for the following components:   CO2 21 (*)    Glucose, Bld 188 (*)    BUN 32 (*)    Creatinine, Ser 1.85 (*)    Albumin 2.8 (*)    AST 49 (*)    ALT 61 (*)    Alkaline Phosphatase 190 (*)    GFR calc non Af Amer 28 (*)    GFR calc Af Amer 33 (*)    All other components within normal limits  PROTIME-INR  LIPASE, BLOOD  I-STAT TROPONIN, ED    EKG EKG Interpretation  Date/Time:  Tuesday December 17 2017 10:06:57 EDT Ventricular Rate:  91 PR Interval:    QRS Duration: 86 QT  Interval:  386 QTC Calculation: 475 R Axis:   30 Text Interpretation:  Sinus rhythm Probable left atrial enlargement ST elevation, consider inferior injury Confirmed by Julianne Rice [phone]) on 12/17/2017 1:15:39 PM   Radiology Dg Chest 2 View  Result Date: 12/17/2017 CLINICAL DATA:  Cough.  Chest discomfort. EXAM: CHEST - 2 VIEW COMPARISON:  12/18/2015 FINDINGS: There is new mild cardiomegaly. Pulmonary vascularity is normal and the lungs are clear. No effusions. Bones appear normal. IMPRESSION: New mild cardiomegaly. Electronically Signed   By: Lorriane Shire M.D.   On: 12/17/2017 11:24    Procedures Procedures (including critical care time)  Medications Ordered in ED Medications - No data to display   Initial Impression / Assessment and Plan / ED Course  I have reviewed the triage vital signs and the nursing notes.  Pertinent labs & imaging results that were available during my care of the patient were reviewed by me and considered in my medical decision making (see chart for details).  Clinical Course as of Dec 18 1734  Tue Dec 17, 2017  1300 Elevated liver enzymes noted.  Added on PT/INR to assess liver function as well as lipase.  Patient has improved creatinine and BUN since her evaluation yesterday, however still has evidence of acute kidney injury.    [AM]  1304 Creatinine(!): 1.85 [AM]  1304 BUN(!): 32 [AM]  1304 Alkaline Phosphatase(!): 190 [AM]  1304 WBC(!): 15.4 [AM]  1733 GFR, Est African American(!): 33 [AM]    Clinical Course User Index [AM] Langston Masker B, PA-C    Differential diagnosis includes acute renal failure secondary to nephrotoxic medication, hypertensive/diabetic nephropathy, infectious etiology, autoimmune disorder given patient's history of low back pain, intermittent fevers, and laboratory evidence of leukocytosis.  See treatment below for values of creatinine.   Lab Results  Component Value Date   CREATININE 1.85 (H) 12/17/2017    CREATININE 2.68 (H) 12/16/2017   CREATININE 0.85 09/24/2017   Fluids initiated in emergency department for acute kidney injury.  Discussed plan of care with patient.  Will seek admission for patient.  1:04 PM Given cardiomegaly and changes in EKG, also in considering post viral myocarditis versus pericarditis. Troponin added.  Will seek admission for patient.  Spoke with PA [facility], who  will admit the patient.  Appreciate [facility] involvement in the care of this patient.  This is a shared visit with Dr. Julianne Rice. Patient was independently evaluated by this attending physician. Attending physician consulted in evaluation and admission management.  Final Clinical Impressions(s) / ED Diagnoses   Final diagnoses:  Acute kidney injury ([place])  Leukocytosis, unspecified type  Elevated blood pressure reading in office with diagnosis of hypertension    ED Discharge Orders    None        Tamala Julian 12/17/17 1736    Julianne Rice, MD 12/20/17 Lurena Nida

## 2017-12-18 DIAGNOSIS — I517 Cardiomegaly: Secondary | ICD-10-CM

## 2017-12-18 DIAGNOSIS — R5383 Other fatigue: Secondary | ICD-10-CM

## 2017-12-18 DIAGNOSIS — E785 Hyperlipidemia, unspecified: Secondary | ICD-10-CM

## 2017-12-18 DIAGNOSIS — D509 Iron deficiency anemia, unspecified: Secondary | ICD-10-CM

## 2017-12-18 LAB — GLUCOSE, CAPILLARY
GLUCOSE-CAPILLARY: 117 mg/dL — AB (ref 70–99)
GLUCOSE-CAPILLARY: 124 mg/dL — AB (ref 70–99)
GLUCOSE-CAPILLARY: 138 mg/dL — AB (ref 70–99)
Glucose-Capillary: 107 mg/dL — ABNORMAL HIGH (ref 70–99)

## 2017-12-18 LAB — BASIC METABOLIC PANEL
ANION GAP: 11 (ref 5–15)
BUN: 20 mg/dL (ref 8–23)
CALCIUM: 8.4 mg/dL — AB (ref 8.9–10.3)
CO2: 17 mmol/L — AB (ref 22–32)
Chloride: 110 mmol/L (ref 98–111)
Creatinine, Ser: 1.25 mg/dL — ABNORMAL HIGH (ref 0.44–1.00)
GFR calc Af Amer: 52 mL/min — ABNORMAL LOW (ref >60)
GFR, EST NON AFRICAN AMERICAN: 45 mL/min — AB (ref >60)
Glucose, Bld: 120 mg/dL — ABNORMAL HIGH (ref 70–99)
Potassium: 4.2 mmol/L (ref 3.5–5.1)
SODIUM: 138 mmol/L (ref 135–145)

## 2017-12-18 LAB — CBC
HCT: 33.1 % — ABNORMAL LOW (ref 36.0–46.0)
HEMOGLOBIN: 10.2 g/dL — AB (ref 12.0–15.0)
MCH: 25 pg — AB (ref 26.0–34.0)
MCHC: 30.8 g/dL (ref 30.0–36.0)
MCV: 81.1 fL (ref 78.0–100.0)
PLATELETS: 283 10*3/uL (ref 150–400)
RBC: 4.08 MIL/uL (ref 3.87–5.11)
RDW: 14.4 % (ref 11.5–15.5)
WBC: 16.1 10*3/uL — AB (ref 4.0–10.5)

## 2017-12-18 LAB — HIV ANTIBODY (ROUTINE TESTING W REFLEX): HIV Screen 4th Generation wRfx: NONREACTIVE

## 2017-12-18 MED ORDER — HYDRALAZINE HCL 20 MG/ML IJ SOLN
10.0000 mg | INTRAMUSCULAR | Status: DC | PRN
  Administered 2017-12-18 – 2017-12-22 (×7): 10 mg via INTRAVENOUS
  Filled 2017-12-18 (×7): qty 1

## 2017-12-18 MED ORDER — HEPARIN SODIUM (PORCINE) 5000 UNIT/ML IJ SOLN: 5000.0000 [IU] | Freq: Three times a day (TID) | INTRAMUSCULAR | Status: DC

## 2017-12-18 MED ORDER — LIDOCAINE HCL 1 % IJ SOLN
INTRAMUSCULAR | Status: AC
  Filled 2017-12-18: qty 20

## 2017-12-18 MED ORDER — SODIUM CHLORIDE 0.9 % IV SOLN: 1.0000 g | INTRAVENOUS | Status: DC

## 2017-12-18 MED ORDER — PIPERACILLIN-TAZOBACTAM 3.375 G IVPB
3.3750 g | Freq: Three times a day (TID) | INTRAVENOUS | Status: DC
  Administered 2017-12-18 – 2017-12-22 (×12): 3.375 g via INTRAVENOUS
  Filled 2017-12-18 (×14): qty 50

## 2017-12-18 MED ORDER — HEPARIN SODIUM (PORCINE) 5000 UNIT/ML IJ SOLN
5000.0000 [IU] | Freq: Three times a day (TID) | INTRAMUSCULAR | Status: AC
  Administered 2017-12-18: 5000 [IU] via SUBCUTANEOUS
  Filled 2017-12-18 (×2): qty 1

## 2017-12-18 NOTE — Progress Notes (Signed)
PROGRESS NOTE    Sarah Brock  YIR:[id number] DOB: [birth year] DOA: 12/17/2017 PCP: Gildardo Cranker, DO   Brief Narrative:   Assessment & Plan:   Principal Problem:   Acute renal failure (ARF) ([place]) Active Problems:   Seizure disorder ([place])   Diabetes mellitus type 2, controlled, without complications ([place])   Anemia, iron deficiency   Asthma   Leukocytosis   Elevated alkaline phosphatase level   Hyperlipidemia   Acute kidney failure ([place])  Acute kidney injury likely secondary to volume depletion use of NSAIDs and lisinopril.  Continue on IV fluids.  Creatinine levels have started to improve.  We will continue to monitor closely.  Patient was counseled against naproxen.  Fatigue weakness tiredness likely secondary to renal failure.  Continue on hydration.  Patient still has symptoms.  Iron panel with low iron and low TIBC.  Ferritin of 170.  HIV was negative.   Fever and chills.  Patient does have leukocytosis T-max noted at 100.3 F.  Send blood cultures, urine cultures and had negative chest x-ray.  Lactate was within normal limits.  Scan of the abdomen and pelvis showed a 4 x 6 x 5 cm thick-walled fluid collection containing gas is identified within the LOWER RIGHT pelvis likely representing abscess. The origin of this probable abscess is difficult to determine but there is a mildly enlarged adjacent appendix noted. A RIGHT tubo-ovarian abscess or small bowel/colonic origin are considerations as these structures lie in this area. There is a 1 cm calcification within this fluid/gas collection could represent an appendicolith.  Will consult surgery.  I spoke with surgery on-call regarding this finding.  We will keep patient n.p.o. for now.  Hypertension.  Accelerated at this time.  Was on lisinopril at home.  Has been discontinued.  Will put patient on PRN hydralazine.  History of seizure disorder.  Continue on Keppra.  Diabetes mellitus type II.  Was on metformin at home.   Patient on sliding scale insulin while in the hospital.  Closely monitor blood glucose levels.  Hold metformin.  DVT prophylaxis: Heparin subcu  Code Status: Full code  Family Communication: With the patient at bedside  Disposition Plan: Home   Consultants: None  Procedures: None  Antimicrobials: Start the patient on Invanz   Subjective:  Complains of ongoing pain with.  Pain radiating to the front of the abdomen.  Still feels fatigued weak and nauseated.  Has dysuria on urinating.  Right lower back pain.  Objective: Vitals:   12/17/17 1540 12/18/17 0048 12/18/17 0534 12/18/17 0816  BP: (!) 167/96 (!) 156/93  (!) 181/107  Pulse: 92 86  98  Resp: 18 16  (!) 28  Temp: 98.9 F (37.2 C) 98.2 F (36.8 C)  100.3 F (37.9 C)  TempSrc: Oral Oral  Oral  SpO2: 98% 100%  99%  Weight: 76.4 kg (168 lb 6.9 oz)  76.4 kg (168 lb 6.4 oz)   Height: 5\' 2"  (1.575 m)       Intake/Output Summary (Last 24 hours) at 12/18/2017 0847 Last data filed at 12/18/2017 0600 Gross per 24 hour  Intake 2625.52 ml  Output -  Net 2625.52 ml   Filed Weights   12/17/17 1007 12/17/17 1540 12/18/17 0534  Weight: 76.2 kg (168 lb) 76.4 kg (168 lb 6.9 oz) 76.4 kg (168 lb 6.4 oz)    Examination: General exam: Appears calm and comfortable ,Not in distress,average built HEENT:PERRL,Oral mucosa moist, Ear/Nose normal on gross exam Respiratory system: Bilateral equal air entry,  normal vesicular breath sounds, no wheezes or crackles  Cardiovascular system: S1 & S2 heard, RRR. No JVD, murmurs, rubs, gallops or clicks. No pedal edema. Gastrointestinal system: Abdomen is nondistended, soft and nontender. No organomegaly or masses felt. Normal bowel sounds heard.  Patient has tenderness over the right lower paraspinal region. Central nervous system: Alert and oriented. No focal neurological deficits. Extremities: No edema, no clubbing ,no cyanosis, distal peripheral pulses palpable. Skin: No rashes, lesions or  ulcers,no icterus ,no pallor MSK: Normal muscle bulk,tone ,power Psychiatry: Judgement and insight appear normal. Mood & affect appropriate.   Data Reviewed: I have personally reviewed following labs and imaging studies  CBC: Recent Labs  Lab 12/16/17 1600 12/17/17 1136 12/18/17 0333  WBC 17.0* 15.4* 16.1*  NEUTROABS 13,175*  --   --   HGB 10.7* 11.0* 10.2*  HCT 33.1* 35.4* 33.1*  MCV 78.3* 80.8 81.1  PLT 313 298 811   Basic Metabolic Panel: Recent Labs  Lab 12/16/17 1600 12/17/17 1136 12/18/17 0333  NA 137 138 138  K 4.3 4.6 4.2  CL 103 107 110  CO2 21 21* 17*  GLUCOSE 173* 188* 120*  BUN 42* 32* 20  CREATININE 2.68* 1.85* 1.25*  CALCIUM 8.9 9.2 8.4*   GFR: Estimated Creatinine Clearance: 44.6 mL/min (A) (by C-G formula based on SCr of 1.25 mg/dL (H)). Liver Function Tests: Recent Labs  Lab 12/16/17 1600 12/17/17 1136  AST 40* 49*  ALT 56* 61*  ALKPHOS  --  190*  BILITOT 0.3 0.6  PROT 7.0 7.3  ALBUMIN  --  2.8*   Recent Labs  Lab 12/17/17 1352  LIPASE 25   No results for input(s): AMMONIA in the last 168 hours. Coagulation Profile: Recent Labs  Lab 12/17/17 1352  INR 1.02   Cardiac Enzymes: No results for input(s): CKTOTAL, CKMB, CKMBINDEX, TROPONINI in the last 168 hours. BNP (last 3 results) No results for input(s): PROBNP in the last 8760 hours. HbA1C: No results for input(s): HGBA1C in the last 72 hours. CBG: Recent Labs  Lab 12/17/17 1612 12/17/17 2100 12/18/17 0757  GLUCAP 120* 137* 117*   Lipid Profile: No results for input(s): CHOL, HDL, LDLCALC, TRIG, CHOLHDL, LDLDIRECT in the last 72 hours. Thyroid Function Tests: Recent Labs    12/16/17 1600  TSH 1.29   Anemia Panel: Recent Labs    12/16/17 1600  FERRITIN 170  TIBC 229*  IRON 22*   Sepsis Labs: Recent Labs  Lab 12/17/17 1904  LATICACIDVEN 0.8    No results found for this or any previous visit (from the past 240 hour(s)).   Radiology Studies: Ct Abdomen  Pelvis Wo Contrast  Result Date: 12/17/2017 CLINICAL DATA:  62 year old female with acute abdominal pain, acute renal failure and elevated LFTs. EXAM: CT ABDOMEN AND PELVIS WITHOUT CONTRAST TECHNIQUE: Multidetector CT imaging of the abdomen and pelvis was performed following the standard protocol without IV contrast. COMPARISON:  05/23/2010 CT FINDINGS: Please note that parenchymal abnormalities may be missed without intravenous contrast. Lower chest: No acute abnormality.  Mild cardiomegaly identified. Hepatobiliary: No hepatic abnormalities identified. The patient is status post cholecystectomy. No biliary dilatation. Pancreas: Unremarkable Spleen: Unremarkable Adrenals/Urinary Tract: The kidneys, adrenal glands and bladder are unremarkable. Stomach/Bowel: A 4 x 6 x 5 cm thick-walled fluid collection containing gas is identified within the LOWER RIGHT pelvis likely representing abscess. The origin of this probable abscess is difficult to determine but there is a mildly enlarged adjacent appendix noted. A RIGHT tubo-ovarian abscess or small  bowel/colonic origin are considerations as these structures lie in this area. There is a 1 cm calcification within this fluid/gas collection could represent an appendicolith. There is no evidence of bowel obstruction or other areas of bowel wall thickening. Colonic diverticulosis is identified. Vascular/Lymphatic: No significant vascular findings are present. No enlarged abdominal or pelvic lymph nodes. Reproductive: Status post hysterectomy.  No LEFT adnexal masses. Other: A trace amount of free pelvic fluid is noted. No pneumoperitoneum. Musculoskeletal: No acute or suspicious bony abnormalities. Degenerative changes in the lumbar spine are identified. IMPRESSION: 1. 4 x 6 x 5 cm probable abscess within the LOWER RIGHT pelvis, of uncertain origin. Considerations include ruptured appendicitis as there is a mildly enlarged adjacent appendix noted. RIGHT tubo-ovarian abscess or  abscess from adjacent small bowel/colon are also considerations. 2. No pneumoperitoneum or bowel obstruction. 3. Mild cardiomegaly. Electronically Signed   By: Margarette Canada M.D.   On: 12/17/2017 17:43   Dg Chest 2 View  Result Date: 12/17/2017 CLINICAL DATA:  Cough.  Chest discomfort. EXAM: CHEST - 2 VIEW COMPARISON:  12/18/2015 FINDINGS: There is new mild cardiomegaly. Pulmonary vascularity is normal and the lungs are clear. No effusions. Bones appear normal. IMPRESSION: New mild cardiomegaly. Electronically Signed   By: Lorriane Shire M.D.   On: 12/17/2017 11:24    Scheduled Meds: . feeding supplement (ENSURE ENLIVE)  237 mL Oral BID BM  . ferrous sulfate  325 mg Oral BID WC  . heparin  5,000 Units Subcutaneous Q8H  . insulin aspart  0-15 Units Subcutaneous TID WC  . insulin aspart  0-5 Units Subcutaneous QHS  . levETIRAcetam  500 mg Oral BID   Continuous Infusions: . sodium chloride 125 mL/hr at 12/18/17 0202     LOS: 1 day   Time spent: More than 50% of that time was spent in counseling and/or coordination of care.  Flora Lipps, MD [facility] Pager [phone]  If 7PM-7AM, please contact night-coverage [email or web address] Password [facility] 12/18/2017, 8:47 AM

## 2017-12-18 NOTE — Progress Notes (Addendum)
Initial Nutrition Assessment  DOCUMENTATION CODES:   Obesity unspecified  INTERVENTION:    Advance diet as medically appropriate   Provide nutrition supplements when/as able  NUTRITION DIAGNOSIS:   Inadequate oral intake related to inability to eat as evidenced by NPO status  GOAL:   Patient will meet greater than or equal to 90% of their needs  MONITOR:   Diet advancement, PO intake, Supplement acceptance, Labs, Skin, Weight trends, I & O's  REASON FOR ASSESSMENT:   Malnutrition Screening Tool  ASSESSMENT:   62 y/o F with a PMH HTN, DM, and seizures who presented to the hospital from her PCP office for renal failure. She endorses a 2-3 week history of allergy-type symptoms and right-sided back pain. Since the weekend patient also reports subjective fever/chills, nausea, 1 episode of emesis after taking Aleve, and central/lower abdominal pain.   RD spoke with patient at bedside. She reports she had a poor appetite for about 2-3 weeks PTA. Pt reveals she had an allergy attack and then started to have back pain.  She also states she's lost about 10 lbs during the above time period. Labs and medications reviewed. UBW is about 173 lbs. CBG's 137-117-107.  Pt is NPO. Had an unopened Ensure supplement bottle on tray table. She is a Adult nurse. Works at [employer]. For intra abdominal abscess drain placement in IR in am.  NUTRITION - FOCUSED PHYSICAL EXAM:  Completed. No muscle or fat depletions noticed.  Diet Order:   Diet Order           Diet NPO time specified Except for: Sips with Meds  Diet effective midnight        Diet NPO time specified  Diet effective now         EDUCATION NEEDS:   No education needs have been identified at this time  Skin:  Skin Assessment: Reviewed RN Assessment  Last BM:  N/A   Intake/Output Summary (Last 24 hours) at 12/18/2017 1651 Last data filed at 12/18/2017 0600 Gross per 24 hour  Intake 1625.52 ml   Output -  Net 1625.52 ml   Height:   Ht Readings from Last 1 Encounters:  12/17/17 5\' 2"  (1.575 m)   Weight:   Wt Readings from Last 1 Encounters:  12/18/17 168 lb 6.4 oz (76.4 kg)   Wt Readings from Last 10 Encounters:  12/18/17 168 lb 6.4 oz (76.4 kg)  12/16/17 168 lb (76.2 kg)  09/24/17 173 lb (78.5 kg)  01/03/17 166 lb 3.2 oz (75.4 kg)  09/18/16 169 lb 9.6 oz (76.9 kg)  02/28/16 168 lb (76.2 kg)  02/24/16 168 lb 8 oz (76.4 kg)  02/17/16 174 lb 3.2 oz (79 kg)  12/05/15 175 lb 3.2 oz (79.5 kg)  11/28/15 175 lb 12.8 oz (79.7 kg)   Ideal Body Weight:  50 kg  BMI:  Body mass index is 30.8 kg/m.  Estimated Nutritional Needs:   Kcal:  1900-2100  Protein:  95-110 gm  Fluid:  1.9-2.1 L  Arthur Holms, RD, LDN Pager #: [phone] After-Hours Pager #: [phone]

## 2017-12-18 NOTE — Consult Note (Signed)
[facility] Consult Note  Sarah Brock 1955/09/13  [id number].    Requesting MD: Louanne Belton, MD Chief Complaint/Reason for Consult: appendicitis with perforation   HPI:  Sarah Brock is a 62 y/o F with a PMH HTN, DM, and seizures who presented to the hospital from her PCP office for renal failure. She endorses a 2-3 week history of allergy-type symptoms and right-sided back pain. Since the weekend patient also reports subjective fever/chills, nausea, 1 episode of emesis after taking Aleve, and central/lower abdominal pain. States that she has never had pain like this before. Pain was not improved with Aleve. Pain exacerbated by movement but does not worsen with PO intake. When her PCP drew labs she had abnormal BUN/Cr, LFTs, and leukocytosis and she was sent to the hospital. Workup of her pain included an abdominal CT scan that was significant for a RLQ abscess (4x6x5 cm) with an enlarged adjacent appendix. General surgery has been asked to consult regarding perforated viscous with abscess. Patient does not take any blood thinners. Nonsmoker. 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 Prior abdominal surgeries include lap chole, hysterectomy, tubal ligation. She has never had a colonoscopy before.  ROS: Review of Systems  Constitutional: Positive for chills and fever.  HENT: Negative.   Respiratory: Negative.   Cardiovascular: Negative.   Gastrointestinal: Positive for abdominal pain. Negative for constipation, diarrhea, nausea and vomiting.  Genitourinary: Negative.   Musculoskeletal: Positive for back pain.  Skin: Negative.   Neurological: Negative.   Endo/Heme/Allergies: Negative.   Psychiatric/Behavioral: Negative.    Family History  Problem Relation Age of Onset  . Cancer Father        Esophageal    Past Medical History:  Diagnosis Date  . Anemia, unspecified   . Asthma    as a child  . Diabetes mellitus    Type II  . History of blood transfusion    pre hysterectomy  .  Osteoarthrosis, unspecified whether generalized or localized, unspecified site   . Other and unspecified hyperlipidemia   . Other convulsions    last one 2013  . Other malaise and fatigue   . Other specified cardiac dysrhythmias(427.89) 2006   "history of Bradycardia" .  wore a mointor nothing found  . Seizure disorder ([place])   . Unspecified essential hypertension   . Unspecified vitamin D deficiency     Past Surgical History:  Procedure Laterality Date  . ABDOMINAL HYSTERECTOMY    . BREAST LUMPECTOMY Right 1997  . CHOLECYSTECTOMY N/A 02/28/2016   Procedure: LAPAROSCOPIC CHOLECYSTECTOMY;  Surgeon: Arta Bruce Kinsinger, MD;  Location: [place];  Service: General;  Laterality: N/A;    Social History:  reports that she has never smoked. She has never used smokeless tobacco. She reports that she does not drink alcohol or use drugs.  Allergies:  Allergies  Allergen Reactions  . Simvastatin Other (See Comments)    Stomach pain, low back pain   . Shrimp [Shellfish Allergy] Other (See Comments)    Swelling and hypersensitivity    Medications Prior to Admission  Medication Sig Dispense Refill  . cetirizine (ZYRTEC) 10 MG tablet Take 10 mg by mouth at bedtime.    Marland Kitchen ezetimibe (ZETIA) 10 MG tablet Take 1 tablet (10 mg total) by mouth daily. 90 tablet 3  . ferrous sulfate 325 (65 FE) MG tablet Take 1 tablet (325 mg total) by mouth 2 (two) times daily with a meal. 60 tablet 2  . levETIRAcetam (KEPPRA) 500 MG tablet TAKE ONE TABLET  BY MOUTH EVERY 12 HOURS FOR SEIZURES 180 tablet 1  . lisinopril (PRINIVIL,ZESTRIL) 30 MG tablet TAKE 1 TABLET BY MOUTH ONCE DAILY FOR BLOOD PRESSURE (Patient taking differently: Take 30 mg by mouth daily. ) 90 tablet 1  . metFORMIN (GLUCOPHAGE) 1000 MG tablet TAKE ONE TABLET BY MOUTH TWICE DAILY WITH MEALS TO CONTROL BLOOD SUGAR 180 tablet 1  . naproxen sodium (ALEVE) 220 MG tablet Take 440 mg by mouth 2 (two) times daily as needed (back pain).    Marland Kitchen glucose blood test  strip Use as instructed 100 each 12    Blood pressure (!) 181/107, pulse 98, temperature 100.3 F (37.9 C), temperature source Oral, resp. rate (!) 28, height 5' 2"  (1.575 m), weight 76.4 kg (168 lb 6.4 oz), SpO2 99 %. Physical Exam: Physical Exam  Constitutional: She is oriented to person, place, and time. She appears well-developed and well-nourished. No distress.  HENT:  Head: Normocephalic and atraumatic.  Nose: Nose normal.  Mouth/Throat: Oropharynx is clear and moist.  Eyes: Pupils are equal, round, and reactive to light. Conjunctivae and EOM are normal. No scleral icterus.  Neck: Normal range of motion. Neck supple. No tracheal deviation present.  Cardiovascular: Normal rate, regular rhythm, normal heart sounds and intact distal pulses.  Pulmonary/Chest: Effort normal and breath sounds normal. No stridor. No respiratory distress. She has no wheezes. She has no rales.  Abdominal: Soft. Normal appearance and bowel sounds are normal. She exhibits no distension. There is no hepatosplenomegaly. There is tenderness in the right lower quadrant and suprapubic area. There is no rebound and no guarding. No hernia.  Musculoskeletal:  Calves soft and nontender without edema  Neurological: She is alert and oriented to person, place, and time. No cranial nerve deficit.  Skin: Skin is warm and dry. No rash noted. She is not diaphoretic.  Psychiatric: She has a normal mood and affect. Her behavior is normal. Judgment and thought content normal.    Results for orders placed or performed during the hospital encounter of 12/17/17 (from the past 48 hour(s))  Urinalysis, Routine w reflex microscopic     Status: Abnormal   Collection Time: 12/17/17 10:27 AM  Result Value Ref Range   Color, Urine AMBER (A) YELLOW    Comment: BIOCHEMICALS MAY BE AFFECTED BY COLOR   APPearance HAZY (A) CLEAR   Specific Gravity, Urine 1.027 1.005 - 1.030   pH 5.0 5.0 - 8.0   Glucose, UA NEGATIVE NEGATIVE mg/dL   Hgb  urine dipstick NEGATIVE NEGATIVE   Bilirubin Urine NEGATIVE NEGATIVE   Ketones, ur NEGATIVE NEGATIVE mg/dL   Protein, ur 100 (A) NEGATIVE mg/dL   Nitrite NEGATIVE NEGATIVE   Leukocytes, UA NEGATIVE NEGATIVE   RBC / HPF 0-5 0 - 5 RBC/hpf   WBC, UA 0-5 0 - 5 WBC/hpf   Bacteria, UA RARE (A) NONE SEEN   Squamous Epithelial / LPF 0-5 0 - 5   Granular Casts, UA PRESENT     Comment: Performed at [facility], [address] [place], [place] [address]  CBC     Status: Abnormal   Collection Time: 12/17/17 11:36 AM  Result Value Ref Range   WBC 15.4 (H) 4.0 - 10.5 K/uL   RBC 4.38 3.87 - 5.11 MIL/uL   Hemoglobin 11.0 (L) 12.0 - 15.0 g/dL   HCT 35.4 (L) 36.0 - 46.0 %   MCV 80.8 78.0 - 100.0 fL   MCH 25.1 (L) 26.0 - 34.0 pg   MCHC 31.1  30.0 - 36.0 g/dL   RDW 14.1 11.5 - 15.5 %   Platelets 298 150 - 400 K/uL    Comment: Performed at [facility], [place] [address], [place], [place] [address]  Comprehensive metabolic panel     Status: Abnormal   Collection Time: 12/17/17 11:36 AM  Result Value Ref Range   Sodium 138 135 - 145 mmol/L   Potassium 4.6 3.5 - 5.1 mmol/L   Chloride 107 98 - 111 mmol/L    Comment: Please note change in reference range.   CO2 21 (L) 22 - 32 mmol/L   Glucose, Bld 188 (H) 70 - 99 mg/dL    Comment: Please note change in reference range.   BUN 32 (H) 8 - 23 mg/dL    Comment: Please note change in reference range.   Creatinine, Ser 1.85 (H) 0.44 - 1.00 mg/dL   Calcium 9.2 8.9 - 10.3 mg/dL   Total Protein 7.3 6.5 - 8.1 g/dL   Albumin 2.8 (L) 3.5 - 5.0 g/dL   AST 49 (H) 15 - 41 U/L   ALT 61 (H) 0 - 44 U/L    Comment: Please note change in reference range.   Alkaline Phosphatase 190 (H) 38 - 126 U/L   Total Bilirubin 0.6 0.3 - 1.2 mg/dL   GFR calc non Af Amer 28 (L) >60 mL/min   GFR calc Af Amer 33 (L) >60 mL/min    Comment: (NOTE) The eGFR has been calculated using the CKD EPI equation. This calculation has not been validated in all clinical  situations. eGFR's persistently <60 mL/min signify possible Chronic Kidney Disease.    Anion gap 10 5 - 15    Comment: Performed at [facility] [address] [place], [place] [address]  Protime-INR     Status: None   Collection Time: 12/17/17  1:52 PM  Result Value Ref Range   Prothrombin Time 13.3 11.4 - 15.2 seconds   INR 1.02     Comment: Performed at [facility] [address], [place], [place] [address]  Lipase, blood     Status: None   Collection Time: 12/17/17  1:52 PM  Result Value Ref Range   Lipase 25 11 - 51 U/L    Comment: Performed at [facility] [address], [place], [place] [address]  I-Stat Troponin, ED (not at [facility])     Status: None   Collection Time: 12/17/17  2:20 PM  Result Value Ref Range   Troponin i, poc 0.07 0.00 - 0.08 ng/mL   Comment 3            Comment: Due to the release kinetics of cTnI, a negative result within the first hours of the onset of symptoms does not rule out myocardial infarction with certainty. If myocardial infarction is still suspected, repeat the test at appropriate intervals.   Glucose, capillary     Status: Abnormal   Collection Time: 12/17/17  4:12 PM  Result Value Ref Range   Glucose-Capillary 120 (H) 70 - 99 mg/dL  HIV antibody (Routine Testing)     Status: None   Collection Time: 12/17/17  4:14 PM  Result Value Ref Range   HIV Screen 4th Generation wRfx Non Reactive Non Reactive    Comment: (NOTE) Performed At: [facility] [address] [place], [place] [id number] Rush Farmer MD [id number] Performed at [facility], [place] [address], [place],  [address]   Lactic acid,  plasma     Status: None   Collection Time: 12/17/17  7:04 PM  Result Value Ref Range   Lactic Acid, Venous 0.8 0.5 - 1.9 mmol/L    Comment: Performed at [facility], [place] [address] [place], [place] [address]  Glucose, capillary     Status: Abnormal   Collection Time: 12/17/17  9:00 PM   Result Value Ref Range   Glucose-Capillary 137 (H) 70 - 99 mg/dL  Basic metabolic panel     Status: Abnormal   Collection Time: 12/18/17  3:33 AM  Result Value Ref Range   Sodium 138 135 - 145 mmol/L   Potassium 4.2 3.5 - 5.1 mmol/L   Chloride 110 98 - 111 mmol/L    Comment: Please note change in reference range.   CO2 17 (L) 22 - 32 mmol/L   Glucose, Bld 120 (H) 70 - 99 mg/dL    Comment: Please note change in reference range.   BUN 20 8 - 23 mg/dL    Comment: Please note change in reference range.   Creatinine, Ser 1.25 (H) 0.44 - 1.00 mg/dL   Calcium 8.4 (L) 8.9 - 10.3 mg/dL   GFR calc non Af Amer 45 (L) >60 mL/min   GFR calc Af Amer 52 (L) >60 mL/min    Comment: (NOTE) The eGFR has been calculated using the CKD EPI equation. This calculation has not been validated in all clinical situations. eGFR's persistently <60 mL/min signify possible Chronic Kidney Disease.    Anion gap 11 5 - 15    Comment: Performed at [facility] [address] [place], [place] [address]  CBC     Status: Abnormal   Collection Time: 12/18/17  3:33 AM  Result Value Ref Range   WBC 16.1 (H) 4.0 - 10.5 K/uL   RBC 4.08 3.87 - 5.11 MIL/uL   Hemoglobin 10.2 (L) 12.0 - 15.0 g/dL   HCT 33.1 (L) 36.0 - 46.0 %   MCV 81.1 78.0 - 100.0 fL   MCH 25.0 (L) 26.0 - 34.0 pg   MCHC 30.8 30.0 - 36.0 g/dL   RDW 14.4 11.5 - 15.5 %   Platelets 283 150 - 400 K/uL    Comment: Performed at [facility], [place] [address], [place], [place] [address]  Glucose, capillary     Status: Abnormal   Collection Time: 12/18/17  7:57 AM  Result Value Ref Range   Glucose-Capillary 117 (H) 70 - 99 mg/dL   Ct Abdomen Pelvis Wo Contrast  Result Date: 12/17/2017 CLINICAL DATA:  62 year old female with acute abdominal pain, acute renal failure and elevated LFTs. EXAM: CT ABDOMEN AND PELVIS WITHOUT CONTRAST TECHNIQUE: Multidetector CT imaging of the abdomen and pelvis was performed following the standard protocol without IV  contrast. COMPARISON:  05/23/2010 CT FINDINGS: Please note that parenchymal abnormalities may be missed without intravenous contrast. Lower chest: No acute abnormality.  Mild cardiomegaly identified. Hepatobiliary: No hepatic abnormalities identified. The patient is status post cholecystectomy. No biliary dilatation. Pancreas: Unremarkable Spleen: Unremarkable Adrenals/Urinary Tract: The kidneys, adrenal glands and bladder are unremarkable. Stomach/Bowel: A 4 x 6 x 5 cm thick-walled fluid collection containing gas is identified within the LOWER RIGHT pelvis likely representing abscess. The origin of this probable abscess is difficult to determine but there is a mildly enlarged adjacent appendix noted. A RIGHT tubo-ovarian abscess or small bowel/colonic origin are considerations as these structures lie in this area. There is a 1 cm calcification within this  fluid/gas collection could represent an appendicolith. There is no evidence of bowel obstruction or other areas of bowel wall thickening. Colonic diverticulosis is identified. Vascular/Lymphatic: No significant vascular findings are present. No enlarged abdominal or pelvic lymph nodes. Reproductive: Status post hysterectomy.  No LEFT adnexal masses. Other: A trace amount of free pelvic fluid is noted. No pneumoperitoneum. Musculoskeletal: No acute or suspicious bony abnormalities. Degenerative changes in the lumbar spine are identified. IMPRESSION: 1. 4 x 6 x 5 cm probable abscess within the LOWER RIGHT pelvis, of uncertain origin. Considerations include ruptured appendicitis as there is a mildly enlarged adjacent appendix noted. RIGHT tubo-ovarian abscess or abscess from adjacent small bowel/colon are also considerations. 2. No pneumoperitoneum or bowel obstruction. 3. Mild cardiomegaly. Electronically Signed   By: Margarette Canada M.D.   On: 12/17/2017 17:43   Dg Chest 2 View  Result Date: 12/17/2017 CLINICAL DATA:  Cough.  Chest discomfort. EXAM: CHEST - 2 VIEW  COMPARISON:  12/18/2015 FINDINGS: There is new mild cardiomegaly. Pulmonary vascularity is normal and the lungs are clear. No effusions. Bones appear normal. IMPRESSION: New mild cardiomegaly. Electronically Signed   By: Lorriane Shire M.D.   On: 12/17/2017 11:24   Assessment/Plan HTN DM Seizure disorder Acute renal failure Metabolic acidosis Right low back pain  RLQ abscess, suspect 2/2 perforated appendicitis - afebrile, HR normal, hypertensive - WBC 16,100  - NPO, IVF  - recommend non-operative management with IV Zosyn and IR consult for percutaneous drainage of abscess. Will continue to follow.  FEN: NPO, IVF ID: Zosyn 6/26 >>  VTE: SCD's, heparin Foley: none   Wellington Hampshire, [facility] 12/18/2017, 10:53 AM Pager: [phone] Consults: [phone] Mon 7:00 am -11:30 AM Tues-Fri 7:00 am-4:30 pm Sat-Sun 7:00 am-11:30 am

## 2017-12-18 NOTE — Consult Note (Signed)
Chief Complaint: Patient was seen in consultation today for intra abdominal abscess drain placement Chief Complaint  Patient presents with  . Weakness   at the request of [facility]   Supervising Physician: Corrie Mckusick  Patient Status: [facility] - In-pt  History of Present Illness: Sarah Brock is a 62 y.o. female   HTN; DM; Sz New onset renal failure Also noted few weeks of Nausea and back pain Fever; chills Was seen by PCP and lab work was abnormal Leukocytosis  Sent for CT IMPRESSION: 1. 4 x 6 x 5 cm probable abscess within the LOWER RIGHT pelvis, of uncertain origin. Considerations include ruptured appendicitis as there is a mildly enlarged adjacent appendix noted. RIGHT tubo-ovarian abscess or abscess from adjacent small bowel/colon are also considerations. 2. No pneumoperitoneum or bowel obstruction. 3. Mild cardiomegaly.  CCM has seen pt and requesting IR drain Dr Barbie Banner has approved procedure Pt has eaten BF this am and did get Hep injection this am Schedule for drain placement in IR in am  Past Medical History:  Diagnosis Date  . Anemia, unspecified   . Asthma    as a child  . Diabetes mellitus    Type II  . History of blood transfusion    pre hysterectomy  . Osteoarthrosis, unspecified whether generalized or localized, unspecified site   . Other and unspecified hyperlipidemia   . Other convulsions    last one 2013  . Other malaise and fatigue   . Other specified cardiac dysrhythmias(427.89) 2006   "history of Bradycardia" .  wore a mointor nothing found  . Seizure disorder ([place])   . Unspecified essential hypertension   . Unspecified vitamin D deficiency     Past Surgical History:  Procedure Laterality Date  . ABDOMINAL HYSTERECTOMY    . BREAST LUMPECTOMY Right 1997  . CHOLECYSTECTOMY N/A 02/28/2016   Procedure: LAPAROSCOPIC CHOLECYSTECTOMY;  Surgeon: Arta Bruce Kinsinger, MD;  Location: [place];  Service: General;  Laterality: N/A;     Allergies: Simvastatin and Shrimp [shellfish allergy]  Medications: Prior to Admission medications   Medication Sig Start Date End Date Taking? Authorizing Provider  cetirizine (ZYRTEC) 10 MG tablet Take 10 mg by mouth at bedtime.   Yes [provider]  ezetimibe (ZETIA) 10 MG tablet Take 1 tablet (10 mg total) by mouth daily. 01/07/17  Yes Lauree Chandler, NP  ferrous sulfate 325 (65 FE) MG tablet Take 1 tablet (325 mg total) by mouth 2 (two) times daily with a meal. 02/26/14  Yes Bubba Camp, Mahima, MD  levETIRAcetam (KEPPRA) 500 MG tablet TAKE ONE TABLET BY MOUTH EVERY 12 HOURS FOR SEIZURES 12/12/17  Yes Lauree Chandler, NP  lisinopril (PRINIVIL,ZESTRIL) 30 MG tablet TAKE 1 TABLET BY MOUTH ONCE DAILY FOR BLOOD PRESSURE Patient taking differently: Take 30 mg by mouth daily.  11/22/17  Yes Eulas Post, Monica, DO  metFORMIN (GLUCOPHAGE) 1000 MG tablet TAKE ONE TABLET BY MOUTH TWICE DAILY WITH MEALS TO CONTROL BLOOD SUGAR 11/22/17  Yes Eulas Post, Monica, DO  naproxen sodium (ALEVE) 220 MG tablet Take 440 mg by mouth 2 (two) times daily as needed (back pain).   Yes [provider]  glucose blood test strip Use as instructed 02/24/14   Blanchie Serve, MD     Family History  Problem Relation Age of Onset  . Cancer Father        Esophageal    Social History   Socioeconomic History  . Marital status: Married    Spouse name: Not  on file  . Number of children: Not on file  . Years of education: Not on file  . Highest education level: Not on file  Occupational History  . Not on file  Social Needs  . Financial resource strain: Not on file  . Food insecurity:    Worry: Not on file    Inability: Not on file  . Transportation needs:    Medical: Not on file    Non-medical: Not on file  Tobacco Use  . Smoking status: Never Smoker  . Smokeless tobacco: Never Used  Substance and Sexual Activity  . Alcohol use: No    Alcohol/week: 0.0 oz    Comment: rarely- none on years  .  Drug use: No  . Sexual activity: Not Currently  Lifestyle  . Physical activity:    Days per week: Not on file    Minutes per session: Not on file  . Stress: Not on file  Relationships  . Social connections:    Talks on phone: Not on file    Gets together: Not on file    Attends religious service: Not on file    Active member of club or organization: Not on file    Attends meetings of clubs or organizations: Not on file    Relationship status: Not on file  Other Topics Concern  . Not on file  Social History Narrative  . Not on file     Review of Systems: A 12 point ROS discussed and pertinent positives are indicated in the HPI above.  All other systems are negative.  Review of Systems  Constitutional: Positive for activity change, appetite change, fatigue and fever.  Respiratory: Negative for shortness of breath.   Cardiovascular: Negative for chest pain.  Gastrointestinal: Positive for abdominal pain and nausea.  Musculoskeletal: Positive for back pain.  Neurological: Positive for weakness.  Psychiatric/Behavioral: Negative for behavioral problems and confusion.    Vital Signs: BP (!) 181/107 (BP Location: Right Arm)   Pulse 98   Temp 100.3 F (37.9 C) (Oral)   Resp (!) 28   Ht 5\' 2"  (1.575 m)   Wt 168 lb 6.4 oz (76.4 kg)   SpO2 99%   BMI 30.80 kg/m   Physical Exam  Constitutional: She is oriented to person, place, and time.  Cardiovascular: Normal rate, regular rhythm and normal heart sounds.  Pulmonary/Chest: Effort normal and breath sounds normal.  Abdominal: Soft. There is tenderness.  Musculoskeletal: Normal range of motion.  Neurological: She is alert and oriented to person, place, and time.  Skin: Skin is warm and dry.  Psychiatric: She has a normal mood and affect. Her behavior is normal. Judgment and thought content normal.  Nursing note and vitals reviewed.   Imaging: Ct Abdomen Pelvis Wo Contrast  Result Date: 12/17/2017 CLINICAL DATA:   62 year old female with acute abdominal pain, acute renal failure and elevated LFTs. EXAM: CT ABDOMEN AND PELVIS WITHOUT CONTRAST TECHNIQUE: Multidetector CT imaging of the abdomen and pelvis was performed following the standard protocol without IV contrast. COMPARISON:  05/23/2010 CT FINDINGS: Please note that parenchymal abnormalities may be missed without intravenous contrast. Lower chest: No acute abnormality.  Mild cardiomegaly identified. Hepatobiliary: No hepatic abnormalities identified. The patient is status post cholecystectomy. No biliary dilatation. Pancreas: Unremarkable Spleen: Unremarkable Adrenals/Urinary Tract: The kidneys, adrenal glands and bladder are unremarkable. Stomach/Bowel: A 4 x 6 x 5 cm thick-walled fluid collection containing gas is identified within the LOWER RIGHT pelvis likely representing abscess. The origin of  this probable abscess is difficult to determine but there is a mildly enlarged adjacent appendix noted. A RIGHT tubo-ovarian abscess or small bowel/colonic origin are considerations as these structures lie in this area. There is a 1 cm calcification within this fluid/gas collection could represent an appendicolith. There is no evidence of bowel obstruction or other areas of bowel wall thickening. Colonic diverticulosis is identified. Vascular/Lymphatic: No significant vascular findings are present. No enlarged abdominal or pelvic lymph nodes. Reproductive: Status post hysterectomy.  No LEFT adnexal masses. Other: A trace amount of free pelvic fluid is noted. No pneumoperitoneum. Musculoskeletal: No acute or suspicious bony abnormalities. Degenerative changes in the lumbar spine are identified. IMPRESSION: 1. 4 x 6 x 5 cm probable abscess within the LOWER RIGHT pelvis, of uncertain origin. Considerations include ruptured appendicitis as there is a mildly enlarged adjacent appendix noted. RIGHT tubo-ovarian abscess or abscess from adjacent small bowel/colon are also  considerations. 2. No pneumoperitoneum or bowel obstruction. 3. Mild cardiomegaly. Electronically Signed   By: Margarette Canada M.D.   On: 12/17/2017 17:43   Dg Chest 2 View  Result Date: 12/17/2017 CLINICAL DATA:  Cough.  Chest discomfort. EXAM: CHEST - 2 VIEW COMPARISON:  12/18/2015 FINDINGS: There is new mild cardiomegaly. Pulmonary vascularity is normal and the lungs are clear. No effusions. Bones appear normal. IMPRESSION: New mild cardiomegaly. Electronically Signed   By: Lorriane Shire M.D.   On: 12/17/2017 11:24    Labs:  CBC: Recent Labs    09/24/17 1603 12/16/17 1600 12/17/17 1136 12/18/17 0333  WBC 7.0 17.0* 15.4* 16.1*  HGB 11.4* 10.7* 11.0* 10.2*  HCT 34.1* 33.1* 35.4* 33.1*  PLT 294 313 298 283    COAGS: Recent Labs    12/17/17 1352  INR 1.02    BMP: Recent Labs    09/24/17 1603 12/16/17 1600 12/17/17 1136 12/18/17 0333  NA 141 137 138 138  K 3.7 4.3 4.6 4.2  CL 106 103 107 110  CO2 27 21 21* 17*  GLUCOSE 99 173* 188* 120*  BUN 12 42* 32* 20  CALCIUM 9.1 8.9 9.2 8.4*  CREATININE 0.85 2.68* 1.85* 1.25*  GFRNONAA 73 18* 28* 45*  GFRAA 85 21* 33* 52*    LIVER FUNCTION TESTS: Recent Labs    01/03/17 1557 09/24/17 1603 12/16/17 1600 12/17/17 1136  BILITOT 0.3 0.3 0.3 0.6  AST 16 20 40* 49*  ALT 15 20 56* 61*  ALKPHOS 70  --   --  190*  PROT 7.2 7.0 7.0 7.3  ALBUMIN 4.3  --   --  2.8*    TUMOR MARKERS: No results for input(s): AFPTM, CEA, CA199, CHROMGRNA in the last 8760 hours.  Assessment and Plan:  abd pain; leukocytosis RLQ abscess per imaging Now scheduled for intra abdominal abscess drain placement Risks and benefits discussed with the patient including bleeding, infection, damage to adjacent structures, bowel perforation/fistula connection, and sepsis.  All of the patient's questions were answered, patient is agreeable to proceed. Consent signed and in chart.   Thank you for this interesting consult.  I greatly enjoyed meeting  Sarah Brock and look forward to participating in their care.  A copy of this report was sent to the requesting provider on this date.  Electronically Signed: Lavonia Drafts, PA-C 12/18/2017, 12:09 PM   I spent a total of 40 Minutes    in face to face in clinical consultation, greater than 50% of which was counseling/coordinating care for abd abscess drain

## 2017-12-19 ENCOUNTER — Inpatient Hospital Stay (HOSPITAL_COMMUNITY): Payer: 59

## 2017-12-19 LAB — COMPREHENSIVE METABOLIC PANEL
ALBUMIN: 2.3 g/dL — AB (ref 3.5–5.0)
ALK PHOS: 224 U/L — AB (ref 38–126)
ALT: 63 U/L — ABNORMAL HIGH (ref 0–44)
ANION GAP: 11 (ref 5–15)
AST: 49 U/L — ABNORMAL HIGH (ref 15–41)
BILIRUBIN TOTAL: 0.8 mg/dL (ref 0.3–1.2)
BUN: 11 mg/dL (ref 8–23)
CALCIUM: 8.1 mg/dL — AB (ref 8.9–10.3)
CO2: 19 mmol/L — ABNORMAL LOW (ref 22–32)
Chloride: 109 mmol/L (ref 98–111)
Creatinine, Ser: 1.15 mg/dL — ABNORMAL HIGH (ref 0.44–1.00)
GFR, EST AFRICAN AMERICAN: 58 mL/min — AB (ref >60)
GFR, EST NON AFRICAN AMERICAN: 50 mL/min — AB (ref >60)
GLUCOSE: 105 mg/dL — AB (ref 70–99)
Potassium: 3.7 mmol/L (ref 3.5–5.1)
Sodium: 139 mmol/L (ref 135–145)
TOTAL PROTEIN: 6.6 g/dL (ref 6.5–8.1)

## 2017-12-19 LAB — CBC
HEMATOCRIT: 32.9 % — AB (ref 36.0–46.0)
Hemoglobin: 10.5 g/dL — ABNORMAL LOW (ref 12.0–15.0)
MCH: 25.2 pg — AB (ref 26.0–34.0)
MCHC: 31.9 g/dL (ref 30.0–36.0)
MCV: 79.1 fL (ref 78.0–100.0)
Platelets: 307 10*3/uL (ref 150–400)
RBC: 4.16 MIL/uL (ref 3.87–5.11)
RDW: 14.2 % (ref 11.5–15.5)
WBC: 18 10*3/uL — ABNORMAL HIGH (ref 4.0–10.5)

## 2017-12-19 LAB — GLUCOSE, CAPILLARY
Glucose-Capillary: 109 mg/dL — ABNORMAL HIGH (ref 70–99)
Glucose-Capillary: 106 mg/dL — ABNORMAL HIGH (ref 70–99)
Glucose-Capillary: 65 mg/dL — ABNORMAL LOW (ref 70–99)
Glucose-Capillary: 130 mg/dL — ABNORMAL HIGH (ref 70–99)
Glucose-Capillary: 137 mg/dL — ABNORMAL HIGH (ref 70–99)

## 2017-12-19 MED ORDER — SODIUM CHLORIDE 0.9% FLUSH
5.0000 mL | Freq: Three times a day (TID) | INTRAVENOUS | Status: DC
  Administered 2017-12-19 – 2017-12-22 (×7): 5 mL

## 2017-12-19 MED ORDER — HEPARIN SODIUM (PORCINE) 5000 UNIT/ML IJ SOLN
5000.0000 [IU] | Freq: Three times a day (TID) | INTRAMUSCULAR | Status: DC
  Administered 2017-12-20 – 2017-12-22 (×7): 5000 [IU] via SUBCUTANEOUS
  Filled 2017-12-19 (×7): qty 1

## 2017-12-19 MED ORDER — FENTANYL CITRATE (PF) 100 MCG/2ML IJ SOLN
INTRAMUSCULAR | Status: AC | PRN
  Administered 2017-12-19: 50 ug via INTRAVENOUS
  Administered 2017-12-19: 25 ug via INTRAVENOUS

## 2017-12-19 MED ORDER — LIDOCAINE HCL 1 % IJ SOLN
INTRAMUSCULAR | Status: AC
  Filled 2017-12-19: qty 20

## 2017-12-19 MED ORDER — FENTANYL CITRATE (PF) 100 MCG/2ML IJ SOLN
INTRAMUSCULAR | Status: AC
Start: 2017-12-19 — End: 2017-12-20
  Filled 2017-12-19: qty 2

## 2017-12-19 MED ORDER — MIDAZOLAM HCL 2 MG/2ML IJ SOLN
INTRAMUSCULAR | Status: AC
  Filled 2017-12-19: qty 2

## 2017-12-19 MED ORDER — MIDAZOLAM HCL 2 MG/2ML IJ SOLN
INTRAMUSCULAR | Status: AC | PRN
  Administered 2017-12-19: 1 mg via INTRAVENOUS
  Administered 2017-12-19: 0.5 mg via INTRAVENOUS

## 2017-12-19 NOTE — Progress Notes (Signed)
[facility] Progress Note     Subjective: CC- perf appy Patient resting this morning. States that she continues to have some right flank and suprapubic pain. Difficulty getting comfortable. She reports mild nausea, no emesis. BM yesterday. WBC up to 18 today, low grade temp 100.3.  Going for perc drain in IR today after lunch time.  Objective: Vital signs in last 24 hours: Temp:  [98.6 F (37 C)-99.3 F (37.4 C)] 99.3 F (37.4 C) (06/27 0834) Pulse Rate:  [90-97] 97 (06/27 0834) Resp:  [20-24] 20 (06/27 0834) BP: (158-190)/(83-98) 184/97 (06/27 0834) SpO2:  [98 %-100 %] 100 % (06/27 0834) Weight:  [168 lb 10.4 oz (76.5 kg)] 168 lb 10.4 oz (76.5 kg) (06/27 0500) Last BM Date: 12/19/17  Intake/Output from previous day: 06/26 0701 - 06/27 0700 In: 2211.7 [P.O.:480; I.V.:1622.2; IV Piggyback:109.5] Out: 3 [Urine:3] Intake/Output this shift: No intake/output data recorded.  PE: Gen:  Alert, NAD HEENT: EOM's intact, pupils equal and round Card:  RRR, no M/G/R heard Pulm:  CTAB, no W/R/R, effort normal Abd: Soft, ND, +BS, no HSM, no hernia, +TTP suprapubic region and RLQ without rebound or guarding Ext:  Calves soft and nontender Psych: A&Ox3  Skin: no rashes noted, warm and dry  Lab Results:  Recent Labs    12/18/17 0333 12/19/17 0306  WBC 16.1* 18.0*  HGB 10.2* 10.5*  HCT 33.1* 32.9*  PLT 283 307   BMET Recent Labs    12/18/17 0333 12/19/17 0306  NA 138 139  K 4.2 3.7  CL 110 109  CO2 17* 19*  GLUCOSE 120* 105*  BUN 20 11  CREATININE 1.25* 1.15*  CALCIUM 8.4* 8.1*   PT/INR Recent Labs    12/17/17 1352  LABPROT 13.3  INR 1.02   CMP     Component Value Date/Time   NA 139 12/19/2017 0306   NA 139 09/14/2015 0802   K 3.7 12/19/2017 0306   CL 109 12/19/2017 0306   CO2 19 (L) 12/19/2017 0306   GLUCOSE 105 (H) 12/19/2017 0306   BUN 11 12/19/2017 0306   BUN 11 09/14/2015 0802   CREATININE 1.15 (H) 12/19/2017 0306   CREATININE 2.68  (H) 12/16/2017 1600   CALCIUM 8.1 (L) 12/19/2017 0306   PROT 6.6 12/19/2017 0306   PROT 7.3 02/21/2015 0810   ALBUMIN 2.3 (L) 12/19/2017 0306   ALBUMIN 4.1 02/21/2015 0810   AST 49 (H) 12/19/2017 0306   ALT 63 (H) 12/19/2017 0306   ALKPHOS 224 (H) 12/19/2017 0306   BILITOT 0.8 12/19/2017 0306   BILITOT 0.2 02/21/2015 0810   GFRNONAA 50 (L) 12/19/2017 0306   GFRNONAA 18 (L) 12/16/2017 1600   GFRAA 58 (L) 12/19/2017 0306   GFRAA 21 (L) 12/16/2017 1600   Lipase     Component Value Date/Time   LIPASE 25 12/17/2017 1352       Studies/Results: Ct Abdomen Pelvis Wo Contrast  Result Date: 12/17/2017 CLINICAL DATA:  62 year old female with acute abdominal pain, acute renal failure and elevated LFTs. EXAM: CT ABDOMEN AND PELVIS WITHOUT CONTRAST TECHNIQUE: Multidetector CT imaging of the abdomen and pelvis was performed following the standard protocol without IV contrast. COMPARISON:  05/23/2010 CT FINDINGS: Please note that parenchymal abnormalities may be missed without intravenous contrast. Lower chest: No acute abnormality.  Mild cardiomegaly identified. Hepatobiliary: No hepatic abnormalities identified. The patient is status post cholecystectomy. No biliary dilatation. Pancreas: Unremarkable Spleen: Unremarkable Adrenals/Urinary Tract: The kidneys, adrenal glands and bladder are unremarkable. Stomach/Bowel: A 4  x 6 x 5 cm thick-walled fluid collection containing gas is identified within the LOWER RIGHT pelvis likely representing abscess. The origin of this probable abscess is difficult to determine but there is a mildly enlarged adjacent appendix noted. A RIGHT tubo-ovarian abscess or small bowel/colonic origin are considerations as these structures lie in this area. There is a 1 cm calcification within this fluid/gas collection could represent an appendicolith. There is no evidence of bowel obstruction or other areas of bowel wall thickening. Colonic diverticulosis is identified.  Vascular/Lymphatic: No significant vascular findings are present. No enlarged abdominal or pelvic lymph nodes. Reproductive: Status post hysterectomy.  No LEFT adnexal masses. Other: A trace amount of free pelvic fluid is noted. No pneumoperitoneum. Musculoskeletal: No acute or suspicious bony abnormalities. Degenerative changes in the lumbar spine are identified. IMPRESSION: 1. 4 x 6 x 5 cm probable abscess within the LOWER RIGHT pelvis, of uncertain origin. Considerations include ruptured appendicitis as there is a mildly enlarged adjacent appendix noted. RIGHT tubo-ovarian abscess or abscess from adjacent small bowel/colon are also considerations. 2. No pneumoperitoneum or bowel obstruction. 3. Mild cardiomegaly. Electronically Signed   By: Margarette Canada M.D.   On: 12/17/2017 17:43   Dg Chest 2 View  Result Date: 12/17/2017 CLINICAL DATA:  Cough.  Chest discomfort. EXAM: CHEST - 2 VIEW COMPARISON:  12/18/2015 FINDINGS: There is new mild cardiomegaly. Pulmonary vascularity is normal and the lungs are clear. No effusions. Bones appear normal. IMPRESSION: New mild cardiomegaly. Electronically Signed   By: Lorriane Shire M.D.   On: 12/17/2017 11:24    Anti-infectives: Anti-infectives (From admission, onward)   Start     Dose/Rate Route Frequency Ordered Stop   12/18/17 1000  piperacillin-tazobactam (ZOSYN) IVPB 3.375 g     3.375 g 12.5 mL/hr over 240 Minutes Intravenous Every 8 hours 12/18/17 0919     12/18/17 0915  ertapenem (INVANZ) 1,000 mg in sodium chloride 0.9 % 100 mL IVPB  Status:  Discontinued     1 g 200 mL/hr over 30 Minutes Intravenous Every 24 hours 12/18/17 0911 12/18/17 0919       Assessment/Plan HTN DM Seizure disorder Acute renal failure - Cr trending down 1.15 from 1.25, continue IVF per primary team  RLQ abscess, suspect 2/2 perforated appendicitis - CT 6/25 showed 4 x 6 x 5 cm probable abscess within the LOWER RIGHT pelvis - WBC up to 18, TMAX 100.3. Blood cx NGTD -  going for perc drain in IR today  FEN: NPO, IVF ID: Zosyn 6/26 >>  VTE: SCD's, heparin Foley: none   Plan - Perc drain in IR today. Continue IV zosyn. Ok to return to clear liquid diet after procedure.   LOS: 2 days    Wellington [place] , [facility] 12/19/2017, 10:10 AM Pager: [phone] Consults: [phone] Mon 7:00 am -11:30 AM Tues-Fri 7:00 am-4:30 pm Sat-Sun 7:00 am-11:30 am

## 2017-12-19 NOTE — Procedures (Signed)
Pre procedural Dx: Right lower abdominal abscess Post procedural Dx: Same  Technically successful CT guided placed of a 10 Fr drainage catheter placement into the right lower abdomen yielding 60 cc of purulent, foul smelling fluid.    All aspirated samples sent to the laboratory for analysis.    EBL: Minimal  Complications: None immediate  Ronny Bacon, MD Pager #: [phone]

## 2017-12-19 NOTE — Progress Notes (Signed)
PROGRESS NOTE    Sarah Brock  XHB:[id number] DOB: [birth year] DOA: 12/17/2017 PCP: Gildardo Cranker, DO   Brief Narrative:  Vision is a 62 years old female with past medical history of diabetes mellitus, iron deficiency anemia, asthma, seizure disorder presented to hospital with 2 to 3 weeks of progressive fatigue malaise and sharp lower abdominal pain radiating towards the front of the abdomen.  He was using NSAIDs and Motrin for pain and had a difficulty urinating and passing bowels.  Patient was noted to have acute kidney injury on arrival to the ED with a creatinine of 2.6 at and was admitted to the hospital.  CT scan of the abdomen and pelvis was performed because of abdominal pain and there was no cough abscess in the right lower quadrant.  Assessment & Plan:  Principal Problem:   AKI (acute kidney injury) ([place]) Active Problems:   Seizure disorder ([place])   Diabetes mellitus type 2, controlled, without complications ([place])   Anemia, iron deficiency   Asthma   Leukocytosis   Elevated alkaline phosphatase level   Hyperlipidemia   Acute kidney failure ([place])   Acute kidney injury likely secondary to volume depletion use of NSAIDs and lisinopril.  Improving with IV fluids.  We will closely monitor renal function.  Fatigue weakness tiredness likely secondary to renal failure and intra-abdominal abscess..  Continue on hydration.  Patient still has symptoms.  Iron panel with low iron and low TIBC.  Ferritin of 170.  HIV was negative.   Fever and chills with abdominal pain.  CT scan of the abdomen and pelvis shows right tubo-ovarian abscess or small bowel/colonic origin.  Patient was seen by surgery and plan is for CT-guided drainage of abscess and possible placement of drain.  This more likely to be appendicular abscess.  He has been started on Zosyn 12/18/2017.  Blood cultures still pending.  Patient does have persistent leukocytosis.  Mildly elevated LFTs.  No signs of biliary  obstruction.  Will repeat CMP in a.m.  Hypertension.   on PRN hydralazine.  Was on lisinopril.  On hold due to AKI.  Will resume Toprol once the AKI improved.  History of seizure disorder.  Continue on Keppra.  No active issues  Diabetes mellitus type II.  Was on metformin at home.  Will hold metformin.  Continue on sliding scale insulin.  Glucose appears to be stable.  DVT prophylaxis: SCD  Code Status: Full code  Family Communication: With the patient at bedside  Disposition Plan: Home   Consultants: Surgery  Procedures: Planning for CT-guided abscess drain  Antimicrobials: On Zosyn since 12/18/17   Subjective:  Patient complains of feeling little better today.  Still has mild lower abdominal pain.  Complains of fatigue nausea.  Objective: Vitals:   12/19/17 0039 12/19/17 0300 12/19/17 0500 12/19/17 0649  BP: (!) 171/98 (!) 163/87  (!) 158/83  Pulse: 90 97    Resp: (!) 24     Temp: 99.1 F (37.3 C)     TempSrc: Oral     SpO2: 100%     Weight:   76.5 kg (168 lb 10.4 oz)   Height:        Intake/Output Summary (Last 24 hours) at 12/19/2017 0831 Last data filed at 12/19/2017 0600 Gross per 24 hour  Intake 2211.68 ml  Output 3 ml  Net 2208.68 ml   Filed Weights   12/17/17 1540 12/18/17 0534 12/19/17 0500  Weight: 76.4 kg (168 lb 6.9 oz) 76.4 kg (168 lb  6.4 oz) 76.5 kg (168 lb 10.4 oz)    Examination:  General exam: Appears calm and comfortable ,Not in distress,average built HEENT:PERRL,Oral mucosa moist, Ear/Nose normal on gross exam Respiratory system: Bilateral equal air entry, normal vesicular breath sounds, no wheezes or crackles  Cardiovascular system: S1 & S2 heard, RRR. No JVD, murmurs, rubs, gallops or clicks. No pedal edema. Gastrointestinal system: Abdomen is nondistended, soft and nontender. No organomegaly or masses felt. Normal bowel sounds heard.  Patient has tenderness over the right lower paraspinal region and right suprapubic region. Central  nervous system: Alert and oriented. No focal neurological deficits. Extremities: No edema, no clubbing ,no cyanosis, distal peripheral pulses palpable. Skin: No rashes, lesions or ulcers,no icterus ,no pallor MSK: Normal muscle bulk,tone ,power Psychiatry: Judgement and insight appear normal. Mood & affect appropriate.   Data Reviewed: I have personally reviewed following labs and imaging studies  CBC: Recent Labs  Lab 12/16/17 1600 12/17/17 1136 12/18/17 0333 12/19/17 0306  WBC 17.0* 15.4* 16.1* 18.0*  NEUTROABS 13,175*  --   --   --   HGB 10.7* 11.0* 10.2* 10.5*  HCT 33.1* 35.4* 33.1* 32.9*  MCV 78.3* 80.8 81.1 79.1  PLT 313 298 283 470   Basic Metabolic Panel: Recent Labs  Lab 12/16/17 1600 12/17/17 1136 12/18/17 0333 12/19/17 0306  NA 137 138 138 139  K 4.3 4.6 4.2 3.7  CL 103 107 110 109  CO2 21 21* 17* 19*  GLUCOSE 173* 188* 120* 105*  BUN 42* 32* 20 11  CREATININE 2.68* 1.85* 1.25* 1.15*  CALCIUM 8.9 9.2 8.4* 8.1*   GFR: Estimated Creatinine Clearance: 48.6 mL/min (A) (by C-G formula based on SCr of 1.15 mg/dL (H)). Liver Function Tests: Recent Labs  Lab 12/16/17 1600 12/17/17 1136 12/19/17 0306  AST 40* 49* 49*  ALT 56* 61* 63*  ALKPHOS  --  190* 224*  BILITOT 0.3 0.6 0.8  PROT 7.0 7.3 6.6  ALBUMIN  --  2.8* 2.3*   Recent Labs  Lab 12/17/17 1352  LIPASE 25   No results for input(s): AMMONIA in the last 168 hours. Coagulation Profile: Recent Labs  Lab 12/17/17 1352  INR 1.02   Cardiac Enzymes: No results for input(s): CKTOTAL, CKMB, CKMBINDEX, TROPONINI in the last 168 hours. BNP (last 3 results) No results for input(s): PROBNP in the last 8760 hours. HbA1C: No results for input(s): HGBA1C in the last 72 hours. CBG: Recent Labs  Lab 12/17/17 2100 12/18/17 0757 12/18/17 1217 12/18/17 1733 12/18/17 2134  GLUCAP 137* 117* 107* 124* 138*   Lipid Profile: No results for input(s): CHOL, HDL, LDLCALC, TRIG, CHOLHDL, LDLDIRECT in the  last 72 hours. Thyroid Function Tests: Recent Labs    12/16/17 1600  TSH 1.29   Anemia Panel: Recent Labs    12/16/17 1600  FERRITIN 170  TIBC 229*  IRON 22*   Sepsis Labs: Recent Labs  Lab 12/17/17 1904  LATICACIDVEN 0.8    No results found for this or any previous visit (from the past 240 hour(s)).   Radiology Studies: Ct Abdomen Pelvis Wo Contrast  Result Date: 12/17/2017 CLINICAL DATA:  62 year old female with acute abdominal pain, acute renal failure and elevated LFTs. EXAM: CT ABDOMEN AND PELVIS WITHOUT CONTRAST TECHNIQUE: Multidetector CT imaging of the abdomen and pelvis was performed following the standard protocol without IV contrast. COMPARISON:  05/23/2010 CT FINDINGS: Please note that parenchymal abnormalities may be missed without intravenous contrast. Lower chest: No acute abnormality.  Mild cardiomegaly identified. Hepatobiliary:  No hepatic abnormalities identified. The patient is status post cholecystectomy. No biliary dilatation. Pancreas: Unremarkable Spleen: Unremarkable Adrenals/Urinary Tract: The kidneys, adrenal glands and bladder are unremarkable. Stomach/Bowel: A 4 x 6 x 5 cm thick-walled fluid collection containing gas is identified within the LOWER RIGHT pelvis likely representing abscess. The origin of this probable abscess is difficult to determine but there is a mildly enlarged adjacent appendix noted. A RIGHT tubo-ovarian abscess or small bowel/colonic origin are considerations as these structures lie in this area. There is a 1 cm calcification within this fluid/gas collection could represent an appendicolith. There is no evidence of bowel obstruction or other areas of bowel wall thickening. Colonic diverticulosis is identified. Vascular/Lymphatic: No significant vascular findings are present. No enlarged abdominal or pelvic lymph nodes. Reproductive: Status post hysterectomy.  No LEFT adnexal masses. Other: A trace amount of free pelvic fluid is noted. No  pneumoperitoneum. Musculoskeletal: No acute or suspicious bony abnormalities. Degenerative changes in the lumbar spine are identified. IMPRESSION: 1. 4 x 6 x 5 cm probable abscess within the LOWER RIGHT pelvis, of uncertain origin. Considerations include ruptured appendicitis as there is a mildly enlarged adjacent appendix noted. RIGHT tubo-ovarian abscess or abscess from adjacent small bowel/colon are also considerations. 2. No pneumoperitoneum or bowel obstruction. 3. Mild cardiomegaly. Electronically Signed   By: Margarette Canada M.D.   On: 12/17/2017 17:43   Dg Chest 2 View  Result Date: 12/17/2017 CLINICAL DATA:  Cough.  Chest discomfort. EXAM: CHEST - 2 VIEW COMPARISON:  12/18/2015 FINDINGS: There is new mild cardiomegaly. Pulmonary vascularity is normal and the lungs are clear. No effusions. Bones appear normal. IMPRESSION: New mild cardiomegaly. Electronically Signed   By: Lorriane Shire M.D.   On: 12/17/2017 11:24    Scheduled Meds: . feeding supplement (ENSURE ENLIVE)  237 mL Oral BID BM  . ferrous sulfate  325 mg Oral BID WC  . insulin aspart  0-15 Units Subcutaneous TID WC  . insulin aspart  0-5 Units Subcutaneous QHS  . levETIRAcetam  500 mg Oral BID   Continuous Infusions: . sodium chloride 125 mL/hr at 12/19/17 0300  . piperacillin-tazobactam (ZOSYN)  IV 3.375 g (12/19/17 0257)     LOS: 2 days   Time spent: More than 50% of that time was spent in counseling and/or coordination of care.  Flora Lipps, MD [facility] Pager [phone]  If 7PM-7AM, please contact night-coverage [email or web address] Password [facility] 12/19/2017, 8:31 AM

## 2017-12-20 LAB — COMPREHENSIVE METABOLIC PANEL
ALBUMIN: 2.1 g/dL — AB (ref 3.5–5.0)
ALT: 49 U/L — ABNORMAL HIGH (ref 0–44)
AST: 36 U/L (ref 15–41)
Alkaline Phosphatase: 187 U/L — ABNORMAL HIGH (ref 38–126)
Anion gap: 10 (ref 5–15)
BUN: 5 mg/dL — AB (ref 8–23)
CHLORIDE: 110 mmol/L (ref 98–111)
CO2: 17 mmol/L — ABNORMAL LOW (ref 22–32)
Calcium: 7.3 mg/dL — ABNORMAL LOW (ref 8.9–10.3)
Creatinine, Ser: 1.01 mg/dL — ABNORMAL HIGH (ref 0.44–1.00)
GFR calc Af Amer: >60 mL/min (ref >60)
GFR, EST NON AFRICAN AMERICAN: 58 mL/min — AB (ref >60)
Glucose, Bld: 160 mg/dL — ABNORMAL HIGH (ref 70–99)
POTASSIUM: 3.2 mmol/L — AB (ref 3.5–5.1)
Sodium: 137 mmol/L (ref 135–145)
Total Bilirubin: 0.7 mg/dL (ref 0.3–1.2)
Total Protein: 5.9 g/dL — ABNORMAL LOW (ref 6.5–8.1)

## 2017-12-20 LAB — URINE CULTURE: Culture: 10000 — AB

## 2017-12-20 LAB — CBC
HCT: 29.6 % — ABNORMAL LOW (ref 36.0–46.0)
Hemoglobin: 9.5 g/dL — ABNORMAL LOW (ref 12.0–15.0)
MCH: 25.3 pg — ABNORMAL LOW (ref 26.0–34.0)
MCHC: 32.1 g/dL (ref 30.0–36.0)
MCV: 78.9 fL (ref 78.0–100.0)
PLATELETS: 294 10*3/uL (ref 150–400)
RBC: 3.75 MIL/uL — AB (ref 3.87–5.11)
RDW: 14.4 % (ref 11.5–15.5)
WBC: 14.3 10*3/uL — AB (ref 4.0–10.5)

## 2017-12-20 LAB — GLUCOSE, CAPILLARY
GLUCOSE-CAPILLARY: 155 mg/dL — AB (ref 70–99)
GLUCOSE-CAPILLARY: 122 mg/dL — AB (ref 70–99)
GLUCOSE-CAPILLARY: 108 mg/dL — AB (ref 70–99)
Glucose-Capillary: 136 mg/dL — ABNORMAL HIGH (ref 70–99)
Glucose-Capillary: 105 mg/dL — ABNORMAL HIGH (ref 70–99)

## 2017-12-20 NOTE — Progress Notes (Signed)
[facility] Progress Note     Subjective: CC:  Having abdominal soreness, worse with coughing and movement. Improved w/ hydrocodone. Pelvic pressure improved compared to yesterday. Having dark, soft BMs (on iron therapy). Ambulating to bathroom. Had some nausea yesterday but kept down some jello. Soft diet at bedside this AM. denies urinary sxs.   Objective: Vital signs in last 24 hours: Temp:  [97.7 F (36.5 C)-99.3 F (37.4 C)] 98.4 F (36.9 C) (06/28 0555) Pulse Rate:  [85-97] 97 (06/28 0555) Resp:  [15-21] 21 (06/28 0555) BP: (159-186)/(84-97) 161/84 (06/28 0555) SpO2:  [99 %-100 %] 100 % (06/28 0555) Weight:  [78.2 kg (172 lb 4.8 oz)] 78.2 kg (172 lb 4.8 oz) (06/28 0500) Last BM Date: 12/19/17  Intake/Output from previous day: 06/27 0701 - 06/28 0700 In: 3139.4 [P.O.:240; I.V.:2676.8; IV Piggyback:212.6] Out: 110 [Drains:110] Intake/Output this shift: No intake/output data recorded.  PE: Gen:  Alert, NAD, pleasant Card:  Regular rate and rhythm, pedal pulses 2+ BL  Pulm:  Normal effort, clear to auscultation bilaterally Abd: Soft, non-distended, appropriately tender, +BS  RLQ drain: 110 cc/24h, sanguinous, cloudy Skin: warm and dry, no rashes  Psych: A&Ox3   Lab Results:  Recent Labs    12/19/17 0306 12/20/17 0311  WBC 18.0* 14.3*  HGB 10.5* 9.5*  HCT 32.9* 29.6*  PLT 307 294   BMET Recent Labs    12/19/17 0306 12/20/17 0311  NA 139 137  K 3.7 3.2*  CL 109 110  CO2 19* 17*  GLUCOSE 105* 160*  BUN 11 5*  CREATININE 1.15* 1.01*  CALCIUM 8.1* 7.3*   PT/INR Recent Labs    12/17/17 1352  LABPROT 13.3  INR 1.02   CMP     Component Value Date/Time   NA 137 12/20/2017 0311   NA 139 09/14/2015 0802   K 3.2 (L) 12/20/2017 0311   CL 110 12/20/2017 0311   CO2 17 (L) 12/20/2017 0311   GLUCOSE 160 (H) 12/20/2017 0311   BUN 5 (L) 12/20/2017 0311   BUN 11 09/14/2015 0802   CREATININE 1.01 (H) 12/20/2017 0311   CREATININE 2.68 (H)  12/16/2017 1600   CALCIUM 7.3 (L) 12/20/2017 0311   PROT 5.9 (L) 12/20/2017 0311   PROT 7.3 02/21/2015 0810   ALBUMIN 2.1 (L) 12/20/2017 0311   ALBUMIN 4.1 02/21/2015 0810   AST 36 12/20/2017 0311   ALT 49 (H) 12/20/2017 0311   ALKPHOS 187 (H) 12/20/2017 0311   BILITOT 0.7 12/20/2017 0311   BILITOT 0.2 02/21/2015 0810   GFRNONAA 58 (L) 12/20/2017 0311   GFRNONAA 18 (L) 12/16/2017 1600   GFRAA >60 12/20/2017 0311   GFRAA 21 (L) 12/16/2017 1600   Lipase     Component Value Date/Time   LIPASE 25 12/17/2017 1352       Studies/Results: Ct Image Guided Drainage By Percutaneous Catheter  Result Date: 12/19/2017 INDICATION: Right lower quadrant abdominal abscess worrisome for ruptured appendicitis in the setting of suspected appendicolith. Please perform CT-guided drainage catheter placement for infection source control purposes. EXAM: CT IMAGE GUIDED DRAINAGE BY PERCUTANEOUS CATHETER COMPARISON:  CT abdomen and pelvis - 12/17/2017 MEDICATIONS: The patient is currently admitted to the hospital and receiving intravenous antibiotics. The antibiotics were administered within an appropriate time frame prior to the initiation of the procedure. ANESTHESIA/SEDATION: Moderate (conscious) sedation was employed during this procedure. A total of Versed 1.5 mg and Fentanyl 75 mcg was administered intravenously. Moderate Sedation Time: 17 minutes. The patient's level of consciousness and  vital signs were monitored continuously by radiology nursing throughout the procedure under my direct supervision. CONTRAST:  None COMPLICATIONS: None immediate. PROCEDURE: Informed written consent was obtained from the patient after a discussion of the risks, benefits and alternatives to treatment. The patient was placed supine on the CT gantry and a pre procedural CT was performed re-demonstrating the known abscess/fluid collection within the right lower abdominal quadrant with dominant component measuring at least 6.8 x  4.5 cm (image 29, series 2). The procedure was planned. A timeout was performed prior to the initiation of the procedure. The skin overlying the ventral aspect of the right lower abdomen/pelvis was prepped and draped in the usual sterile fashion. The overlying soft tissues were anesthetized with 1% lidocaine with epinephrine. Appropriate trajectory was planned with the use of a 22 gauge spinal needle. An 18 gauge trocar needle was advanced into the abscess/fluid collection and a short Amplatz super stiff wire was coiled within the collection. Appropriate positioning was confirmed with a limited CT scan. The tract was serially dilated allowing placement of a 10 [place] all-purpose drainage catheter. Appropriate positioning was confirmed with a limited postprocedural CT scan. Approximately 60 ml of purulent fluid was aspirated. The tube was connected to a drainage bag and sutured in place. A dressing was placed. The patient tolerated the procedure well without immediate post procedural complication. IMPRESSION: Successful CT guided placement of a 10 French all purpose drain catheter into the right lower abdominal/pelvic abscess with aspiration of 60 mL of purulent fluid. Samples were sent to the laboratory as requested by the ordering clinical team. Electronically Signed   By: Sandi Mariscal M.D.   On: 12/19/2017 15:45    Anti-infectives: Anti-infectives (From admission, onward)   Start     Dose/Rate Route Frequency Ordered Stop   12/18/17 1000  piperacillin-tazobactam (ZOSYN) IVPB 3.375 g     3.375 g 12.5 mL/hr over 240 Minutes Intravenous Every 8 hours 12/18/17 0919     12/18/17 0915  ertapenem (INVANZ) 1,000 mg in sodium chloride 0.9 % 100 mL IVPB  Status:  Discontinued     1 g 200 mL/hr over 30 Minutes Intravenous Every 24 hours 12/18/17 0911 12/18/17 0919     Assessment/Plan HTN DM Seizure disorder Acute renal failure - Cr trending down 1.15 from 1.25, continue IVF per primary team  RLQ abscess,  suspect 2/2 perforated appendicitis - CT 6/25 showed 4 x 6 x 5 cm probable abscess within the LOWER RIGHT pelvis - S/P RLQ IR perc drain 6/27, GS w/ GNR, GVR, G+cocci, follow CX/sensitivities; 110 cc/24h  - WBC 14 from 18, fever resolved. Blood cx NGTD - SOFT diet  - mobilize TID, IS   FEN: NPO, IVF ID: Zosyn 6/26 >> VTE: SCD's,heparin Foley: none Follow up: IR drain clinic, Dr. Georgette Dover to discuss interval appendectomy.   Plan - continue drain and IV abx, follow cultures. Anticipate she will be stable for discharge in 1-2 days on PO abx, pending her white count continues to improve and she tolerates a solid diet.     LOS: 3 days    [facility] 12/20/2017, 7:33 AM Pager: [phone] Consults: [phone] Mon-Fri 7:00 am-4:30 pm Sat-Sun 7:00 am-11:30 am

## 2017-12-20 NOTE — Progress Notes (Signed)
Referring Physician(s): [facility] DR B Grandville Silos   Supervising Physician: Arne Cleveland  Patient Status:  Sarah Brock - In-pt  Chief Complaint:  RLQ abscess  Subjective:  perforated appy Intra abd abscess Drain placed in IR 6/26 Pt is feeling sl better today' Less pressure Had soft BM    Allergies: Simvastatin and Shrimp [shellfish allergy]  Medications: Prior to Admission medications   Medication Sig Start Date End Date Taking? Authorizing Provider  cetirizine (ZYRTEC) 10 MG tablet Take 10 mg by mouth at bedtime.   Yes [provider]  ezetimibe (ZETIA) 10 MG tablet Take 1 tablet (10 mg total) by mouth daily. 01/07/17  Yes Lauree Chandler, NP  ferrous sulfate 325 (65 FE) MG tablet Take 1 tablet (325 mg total) by mouth 2 (two) times daily with a meal. 02/26/14  Yes Bubba Camp, Mahima, MD  levETIRAcetam (KEPPRA) 500 MG tablet TAKE ONE TABLET BY MOUTH EVERY 12 HOURS FOR SEIZURES 12/12/17  Yes Lauree Chandler, NP  lisinopril (PRINIVIL,ZESTRIL) 30 MG tablet TAKE 1 TABLET BY MOUTH ONCE DAILY FOR BLOOD PRESSURE Patient taking differently: Take 30 mg by mouth daily.  11/22/17  Yes Eulas Post, Monica, DO  metFORMIN (GLUCOPHAGE) 1000 MG tablet TAKE ONE TABLET BY MOUTH TWICE DAILY WITH MEALS TO CONTROL BLOOD SUGAR 11/22/17  Yes Eulas Post, Monica, DO  naproxen sodium (ALEVE) 220 MG tablet Take 440 mg by mouth 2 (two) times daily as needed (back pain).   Yes [provider]  glucose blood test strip Use as instructed 02/24/14   Blanchie Serve, MD     Vital Signs: BP (!) 173/90 (BP Location: Left Arm)   Pulse 78   Temp 98.4 F (36.9 C) (Oral)   Resp 18   Ht 5\' 2"  (1.575 m)   Wt 172 lb 4.8 oz (78.2 kg)   SpO2 98%   BMI 31.51 kg/m   Physical Exam  Abdominal: Soft. There is tenderness.  Skin: Skin is warm and dry.  Site is clean and dry Tender OP bloody pus 110 cc yesterday' 50 cc in bag Abundant wbcs; Gr - rods; gr+ cocci     Imaging: Ct Abdomen Pelvis Wo  Contrast  Result Date: 12/17/2017 CLINICAL DATA:  62 year old female with acute abdominal pain, acute renal failure and elevated LFTs. EXAM: CT ABDOMEN AND PELVIS WITHOUT CONTRAST TECHNIQUE: Multidetector CT imaging of the abdomen and pelvis was performed following the standard protocol without IV contrast. COMPARISON:  05/23/2010 CT FINDINGS: Please note that parenchymal abnormalities may be missed without intravenous contrast. Lower chest: No acute abnormality.  Mild cardiomegaly identified. Hepatobiliary: No hepatic abnormalities identified. The patient is status post cholecystectomy. No biliary dilatation. Pancreas: Unremarkable Spleen: Unremarkable Adrenals/Urinary Tract: The kidneys, adrenal glands and bladder are unremarkable. Stomach/Bowel: A 4 x 6 x 5 cm thick-walled fluid collection containing gas is identified within the LOWER RIGHT pelvis likely representing abscess. The origin of this probable abscess is difficult to determine but there is a mildly enlarged adjacent appendix noted. A RIGHT tubo-ovarian abscess or small bowel/colonic origin are considerations as these structures lie in this area. There is a 1 cm calcification within this fluid/gas collection could represent an appendicolith. There is no evidence of bowel obstruction or other areas of bowel wall thickening. Colonic diverticulosis is identified. Vascular/Lymphatic: No significant vascular findings are present. No enlarged abdominal or pelvic lymph nodes. Reproductive: Status post hysterectomy.  No LEFT adnexal masses. Other: A trace amount of free pelvic fluid is noted. No pneumoperitoneum. Musculoskeletal: No acute or  suspicious bony abnormalities. Degenerative changes in the lumbar spine are identified. IMPRESSION: 1. 4 x 6 x 5 cm probable abscess within the LOWER RIGHT pelvis, of uncertain origin. Considerations include ruptured appendicitis as there is a mildly enlarged adjacent appendix noted. RIGHT tubo-ovarian abscess or abscess  from adjacent small bowel/colon are also considerations. 2. No pneumoperitoneum or bowel obstruction. 3. Mild cardiomegaly. Electronically Signed   By: Margarette Canada M.D.   On: 12/17/2017 17:43   Dg Chest 2 View  Result Date: 12/17/2017 CLINICAL DATA:  Cough.  Chest discomfort. EXAM: CHEST - 2 VIEW COMPARISON:  12/18/2015 FINDINGS: There is new mild cardiomegaly. Pulmonary vascularity is normal and the lungs are clear. No effusions. Bones appear normal. IMPRESSION: New mild cardiomegaly. Electronically Signed   By: Lorriane Shire M.D.   On: 12/17/2017 11:24   Ct Image Guided Drainage By Percutaneous Catheter  Result Date: 12/19/2017 INDICATION: Right lower quadrant abdominal abscess worrisome for ruptured appendicitis in the setting of suspected appendicolith. Please perform CT-guided drainage catheter placement for infection source control purposes. EXAM: CT IMAGE GUIDED DRAINAGE BY PERCUTANEOUS CATHETER COMPARISON:  CT abdomen and pelvis - 12/17/2017 MEDICATIONS: The patient is currently admitted to the hospital and receiving intravenous antibiotics. The antibiotics were administered within an appropriate time frame prior to the initiation of the procedure. ANESTHESIA/SEDATION: Moderate (conscious) sedation was employed during this procedure. A total of Versed 1.5 mg and Fentanyl 75 mcg was administered intravenously. Moderate Sedation Time: 17 minutes. The patient's level of consciousness and vital signs were monitored continuously by radiology nursing throughout the procedure under my direct supervision. CONTRAST:  None COMPLICATIONS: None immediate. PROCEDURE: Informed written consent was obtained from the patient after a discussion of the risks, benefits and alternatives to treatment. The patient was placed supine on the CT gantry and a pre procedural CT was performed re-demonstrating the known abscess/fluid collection within the right lower abdominal quadrant with dominant component measuring at least  6.8 x 4.5 cm (image 29, series 2). The procedure was planned. A timeout was performed prior to the initiation of the procedure. The skin overlying the ventral aspect of the right lower abdomen/pelvis was prepped and draped in the usual sterile fashion. The overlying soft tissues were anesthetized with 1% lidocaine with epinephrine. Appropriate trajectory was planned with the use of a 22 gauge spinal needle. An 18 gauge trocar needle was advanced into the abscess/fluid collection and a short Amplatz super stiff wire was coiled within the collection. Appropriate positioning was confirmed with a limited CT scan. The tract was serially dilated allowing placement of a 10 [place] all-purpose drainage catheter. Appropriate positioning was confirmed with a limited postprocedural CT scan. Approximately 60 ml of purulent fluid was aspirated. The tube was connected to a drainage bag and sutured in place. A dressing was placed. The patient tolerated the procedure well without immediate post procedural complication. IMPRESSION: Successful CT guided placement of a 10 French all purpose drain catheter into the right lower abdominal/pelvic abscess with aspiration of 60 mL of purulent fluid. Samples were sent to the laboratory as requested by the ordering clinical team. Electronically Signed   By: Sandi Mariscal M.D.   On: 12/19/2017 15:45    Labs:  CBC: Recent Labs    12/17/17 1136 12/18/17 0333 12/19/17 0306 12/20/17 0311  WBC 15.4* 16.1* 18.0* 14.3*  HGB 11.0* 10.2* 10.5* 9.5*  HCT 35.4* 33.1* 32.9* 29.6*  PLT 298 283 307 294    COAGS: Recent Labs    12/17/17 1352  INR 1.02    BMP: Recent Labs    12/17/17 1136 12/18/17 0333 12/19/17 0306 12/20/17 0311  NA 138 138 139 137  K 4.6 4.2 3.7 3.2*  CL 107 110 109 110  CO2 21* 17* 19* 17*  GLUCOSE 188* 120* 105* 160*  BUN 32* 20 11 5*  CALCIUM 9.2 8.4* 8.1* 7.3*  CREATININE 1.85* 1.25* 1.15* 1.01*  GFRNONAA 28* 45* 50* 58*  GFRAA 33* 52* 58* >60     LIVER FUNCTION TESTS: Recent Labs    01/03/17 1557  12/16/17 1600 12/17/17 1136 12/19/17 0306 12/20/17 0311  BILITOT 0.3   < > 0.3 0.6 0.8 0.7  AST 16   < > 40* 49* 49* 36  ALT 15   < > 56* 61* 63* 49*  ALKPHOS 70  --   --  190* 224* 187*  PROT 7.2   < > 7.0 7.3 6.6 5.9*  ALBUMIN 4.3  --   --  2.8* 2.3* 2.1*   < > = values in this interval not displayed.    Assessment and Plan:  Perforated appendix Abscess drain placed 6/26 Need to flush daily at home- 5 cc saline Record OP Will follow in [facility] She will hear from scheduler for time and date  Electronically Signed: Arik Husmann A, PA-C 12/20/2017, 11:15 AM   I spent a total of 15 Minutes at the the patient's bedside AND on the patient's hospital floor or unit, greater than 50% of which was counseling/coordinating care for abscess drain

## 2017-12-20 NOTE — Progress Notes (Addendum)
PROGRESS NOTE    Sarah Brock  TOI:[id number] DOB: [birth year] DOA: 12/17/2017 PCP: Gildardo Cranker, DO   Brief Narrative:  Patient is a 62 years old female with past medical history of diabetes mellitus, iron deficiency anemia, asthma, seizure disorder presented to hospital with 2 to 3 weeks of progressive fatigue malaise and sharp lower abdominal pain radiating towards the front of the abdomen.  Patient was using NSAIDs for pain and had  difficulty urinating and passing bowels.  Patient was noted to have acute kidney injury on arrival to the ED with a creatinine of 2.6  and was admitted to the hospital.  CT scan of the abdomen and pelvis was performed because of abdominal pain and there was evidence of abscess in the right lower quadrant.  Assessment & Plan:  Principal Problem:   AKI (acute kidney injury) ([place]) Active Problems:   Seizure disorder ([place])   Diabetes mellitus type 2, controlled, without complications ([place])   Anemia, iron deficiency   Asthma   Leukocytosis   Elevated alkaline phosphatase level   Hyperlipidemia   Acute kidney failure ([place])   Acute kidney injury likely secondary to volume depletion use of NSAIDs and lisinopril.  Improving with IV fluids. Follow BMP  Fatigue weakness tiredness likely secondary to renal failure and intra-abdominal abscess.improving.  Fever and chills with abdominal pain.  CT scan of the abdomen done likely perforated appendicular abscess.   Status post drain placement by IR on 12/18/17.  on Zosyn since 12/18/2017.  Blood cultures negative so far, and pus cultures still pending.  Patient does have trending down leukocytosis. Patient will need interval appendectomy as outpatient.   Mildly elevated LFTs.  No signs of biliary obstruction.  Slightly improved.  Hypertension.   on PRN hydralazine.  Was on lisinopril.  On hold due to AKI, likely start by am.   Will resume once the AKI improved.   History of seizure disorder.  Continue on Keppra.   No active issues  Diabetes mellitus type II.  Was on metformin at home, now on hold.  Continue on sliding scale insulin.  Glucose appears to be stable.  DVT prophylaxis: heparin  Code Status: Full code  Family Communication: With the patient at bedside  Disposition Plan: Home in 2-3 days if ok with IR and surgery. Plan for antibiotics on discharge. ? Repeat scan prior to discharge.   Consultants: Surgery  Procedures:  CT-guided abscess drain 12/18/17  Antimicrobials: On Zosyn since 12/18/17   Subjective:  Patient complains of feeling little better today.  Still has mild lower abdominal pain.  Complains of fatigue nausea. Still draining reddish brownish fluids.   Objective: Vitals:   12/20/17 0003 12/20/17 0500 12/20/17 0555 12/20/17 0837  BP: (!) 178/97  (!) 161/84 (!) 173/90  Pulse: 97  97 78  Resp: 19  (!) 21 18  Temp: 98.7 F (37.1 C)  98.4 F (36.9 C)   TempSrc: Oral  Oral   SpO2: 99%  100% 98%  Weight:  78.2 kg (172 lb 4.8 oz)    Height:        Intake/Output Summary (Last 24 hours) at 12/20/2017 1411 Last data filed at 12/20/2017 0543 Gross per 24 hour  Intake 3139.4 ml  Output 110 ml  Net 3029.4 ml   Filed Weights   12/18/17 0534 12/19/17 0500 12/20/17 0500  Weight: 76.4 kg (168 lb 6.4 oz) 76.5 kg (168 lb 10.4 oz) 78.2 kg (172 lb 4.8 oz)    Examination: General exam:  Appears calm and comfortable ,Not in distress,average built HEENT:PERRL,Oral mucosa moist, Ear/Nose normal on gross exam Respiratory system: Bilateral equal air entry, normal vesicular breath sounds, no wheezes or crackles  Cardiovascular system: S1 & S2 heard, RRR. No JVD, murmurs, rubs, gallops or clicks. No pedal edema. Gastrointestinal system: status post drain placement. BS+ Central nervous system: Alert and oriented. No focal neurological deficits. Extremities: No edema, no clubbing ,no cyanosis, distal peripheral pulses palpable. Skin: No rashes, lesions or ulcers,no icterus ,no  pallor MSK: Normal muscle bulk,tone ,power Psychiatry: Judgement and insight appear normal. Mood & affect appropriate.   Data Reviewed: I have personally reviewed following labs and imaging studies  CBC: Recent Labs  Lab 12/16/17 1600 12/17/17 1136 12/18/17 0333 12/19/17 0306 12/20/17 0311  WBC 17.0* 15.4* 16.1* 18.0* 14.3*  NEUTROABS 13,175*  --   --   --   --   HGB 10.7* 11.0* 10.2* 10.5* 9.5*  HCT 33.1* 35.4* 33.1* 32.9* 29.6*  MCV 78.3* 80.8 81.1 79.1 78.9  PLT 313 298 283 307 973   Basic Metabolic Panel: Recent Labs  Lab 12/16/17 1600 12/17/17 1136 12/18/17 0333 12/19/17 0306 12/20/17 0311  NA 137 138 138 139 137  K 4.3 4.6 4.2 3.7 3.2*  CL 103 107 110 109 110  CO2 21 21* 17* 19* 17*  GLUCOSE 173* 188* 120* 105* 160*  BUN 42* 32* 20 11 5*  CREATININE 2.68* 1.85* 1.25* 1.15* 1.01*  CALCIUM 8.9 9.2 8.4* 8.1* 7.3*   GFR: Estimated Creatinine Clearance: 55.9 mL/min (A) (by C-G formula based on SCr of 1.01 mg/dL (H)). Liver Function Tests: Recent Labs  Lab 12/16/17 1600 12/17/17 1136 12/19/17 0306 12/20/17 0311  AST 40* 49* 49* 36  ALT 56* 61* 63* 49*  ALKPHOS  --  190* 224* 187*  BILITOT 0.3 0.6 0.8 0.7  PROT 7.0 7.3 6.6 5.9*  ALBUMIN  --  2.8* 2.3* 2.1*   Recent Labs  Lab 12/17/17 1352  LIPASE 25   No results for input(s): AMMONIA in the last 168 hours. Coagulation Profile: Recent Labs  Lab 12/17/17 1352  INR 1.02   Cardiac Enzymes: No results for input(s): CKTOTAL, CKMB, CKMBINDEX, TROPONINI in the last 168 hours. BNP (last 3 results) No results for input(s): PROBNP in the last 8760 hours. HbA1C: No results for input(s): HGBA1C in the last 72 hours. CBG: Recent Labs  Lab 12/19/17 1802 12/19/17 2025 12/19/17 2210 12/20/17 0800 12/20/17 1131  GLUCAP 65* 130* 137* 155* 136*   Lipid Profile: No results for input(s): CHOL, HDL, LDLCALC, TRIG, CHOLHDL, LDLDIRECT in the last 72 hours. Thyroid Function Tests: No results for input(s):  TSH, T4TOTAL, FREET4, T3FREE, THYROIDAB in the last 72 hours. Anemia Panel: No results for input(s): VITAMINB12, FOLATE, FERRITIN, TIBC, IRON, RETICCTPCT in the last 72 hours. Sepsis Labs: Recent Labs  Lab 12/17/17 1904  LATICACIDVEN 0.8    Recent Results (from the past 240 hour(s))  Culture, blood (routine x 2)     Status: None (Preliminary result)   Collection Time: 12/18/17 10:50 AM  Result Value Ref Range Status   Specimen Description BLOOD RIGHT ANTECUBITAL  Final   Special Requests   Final    BOTTLES DRAWN AEROBIC ONLY Blood Culture adequate volume   Culture   Final    NO GROWTH 2 DAYS Performed at [facility], [address], [place], [place] [address]    Report Status PENDING  Incomplete  Culture, blood (routine x 2)  Status: None (Preliminary result)   Collection Time: 12/18/17 11:00 AM  Result Value Ref Range Status   Specimen Description BLOOD LEFT HAND  Final   Special Requests   Final    BOTTLES DRAWN AEROBIC ONLY Blood Culture adequate volume   Culture   Final    NO GROWTH 2 DAYS Performed at [facility], [address], [place], [place] [address]    Report Status PENDING  Incomplete  Culture, Urine     Status: Abnormal   Collection Time: 12/18/17 12:25 PM  Result Value Ref Range Status   Specimen Description URINE, RANDOM  Final   Special Requests   Final    NONE Performed at [facility], [place] [address] [place], [place] [address]    Culture 10,000 COLONIES/mL ESCHERICHIA COLI (A)  Final   Report Status 12/20/2017 FINAL  Final   Organism ID, Bacteria ESCHERICHIA COLI (A)  Final      Susceptibility   Escherichia coli - MIC*    AMPICILLIN >=32 RESISTANT Resistant     CEFAZOLIN <=4 SENSITIVE Sensitive     CEFTRIAXONE <=1 SENSITIVE Sensitive     CIPROFLOXACIN <=0.25 SENSITIVE Sensitive     GENTAMICIN <=1 SENSITIVE Sensitive     IMIPENEM <=0.25 SENSITIVE Sensitive     NITROFURANTOIN <=16 SENSITIVE Sensitive     TRIMETH/SULFA <=20  SENSITIVE Sensitive     AMPICILLIN/SULBACTAM >=32 RESISTANT Resistant     PIP/TAZO <=4 SENSITIVE Sensitive     Extended ESBL NEGATIVE Sensitive     * 10,000 COLONIES/mL ESCHERICHIA COLI  Aerobic/Anaerobic Culture (surgical/deep wound)     Status: None (Preliminary result)   Collection Time: 12/19/17  3:25 PM  Result Value Ref Range Status   Specimen Description ABSCESS RIGHT LOWER ABDOMEN  Final   Special Requests POST CT GUIDED DRAIN PLACEMENT  Final   Gram Stain   Final    ABUNDANT WBC PRESENT, PREDOMINANTLY PMN ABUNDANT GRAM VARIABLE ROD ABUNDANT GRAM NEGATIVE RODS MODERATE GRAM POSITIVE COCCI    Culture   Final    CULTURE REINCUBATED FOR BETTER GROWTH Performed at [facility], [address] [place], [place] [address]    Report Status PENDING  Incomplete     Radiology Studies: Ct Image Guided Drainage By Percutaneous Catheter  Result Date: 12/19/2017 INDICATION: Right lower quadrant abdominal abscess worrisome for ruptured appendicitis in the setting of suspected appendicolith. Please perform CT-guided drainage catheter placement for infection source control purposes. EXAM: CT IMAGE GUIDED DRAINAGE BY PERCUTANEOUS CATHETER COMPARISON:  CT abdomen and pelvis - 12/17/2017 MEDICATIONS: The patient is currently admitted to the hospital and receiving intravenous antibiotics. The antibiotics were administered within an appropriate time frame prior to the initiation of the procedure. ANESTHESIA/SEDATION: Moderate (conscious) sedation was employed during this procedure. A total of Versed 1.5 mg and Fentanyl 75 mcg was administered intravenously. Moderate Sedation Time: 17 minutes. The patient's level of consciousness and vital signs were monitored continuously by radiology nursing throughout the procedure under my direct supervision. CONTRAST:  None COMPLICATIONS: None immediate. PROCEDURE: Informed written consent was obtained from the patient after a discussion of the risks, benefits  and alternatives to treatment. The patient was placed supine on the CT gantry and a pre procedural CT was performed re-demonstrating the known abscess/fluid collection within the right lower abdominal quadrant with dominant component measuring at least 6.8 x 4.5 cm (image 29, series 2). The procedure was planned. A timeout was performed prior to the initiation of the  procedure. The skin overlying the ventral aspect of the right lower abdomen/pelvis was prepped and draped in the usual sterile fashion. The overlying soft tissues were anesthetized with 1% lidocaine with epinephrine. Appropriate trajectory was planned with the use of a 22 gauge spinal needle. An 18 gauge trocar needle was advanced into the abscess/fluid collection and a short Amplatz super stiff wire was coiled within the collection. Appropriate positioning was confirmed with a limited CT scan. The tract was serially dilated allowing placement of a 10 [place] all-purpose drainage catheter. Appropriate positioning was confirmed with a limited postprocedural CT scan. Approximately 60 ml of purulent fluid was aspirated. The tube was connected to a drainage bag and sutured in place. A dressing was placed. The patient tolerated the procedure well without immediate post procedural complication. IMPRESSION: Successful CT guided placement of a 10 French all purpose drain catheter into the right lower abdominal/pelvic abscess with aspiration of 60 mL of purulent fluid. Samples were sent to the laboratory as requested by the ordering clinical team. Electronically Signed   By: Sandi Mariscal M.D.   On: 12/19/2017 15:45    Scheduled Meds: . feeding supplement (ENSURE ENLIVE)  237 mL Oral BID BM  . ferrous sulfate  325 mg Oral BID WC  . heparin  5,000 Units Subcutaneous Q8H  . insulin aspart  0-15 Units Subcutaneous TID WC  . insulin aspart  0-5 Units Subcutaneous QHS  . levETIRAcetam  500 mg Oral BID  . sodium chloride flush  5 mL Intracatheter Q8H    Continuous Infusions: . sodium chloride 125 mL/hr at 12/20/17 1152  . piperacillin-tazobactam (ZOSYN)  IV 3.375 g (12/20/17 0921)     LOS: 3 days   Time spent: More than 50% of that time was spent in counseling and/or coordination of care.  Flora Lipps, MD [facility]  If 7PM-7AM, please contact night-coverage [email or web address] Password [facility] 12/20/2017, 2:11 PM

## 2017-12-20 NOTE — Telephone Encounter (Signed)
Patient left a message with the answering service requesting a a letter be sent to her insurance stating why [facility]. Her MRI is on 12/31/2017. Patient said if there are any questions please call her.

## 2017-12-21 DIAGNOSIS — D72829 Elevated white blood cell count, unspecified: Secondary | ICD-10-CM

## 2017-12-21 DIAGNOSIS — N179 Acute kidney failure, unspecified: Secondary | ICD-10-CM

## 2017-12-21 DIAGNOSIS — K3532 Acute appendicitis with perforation and localized peritonitis, without abscess: Secondary | ICD-10-CM

## 2017-12-21 DIAGNOSIS — E119 Type 2 diabetes mellitus without complications: Secondary | ICD-10-CM

## 2017-12-21 LAB — AEROBIC/ANAEROBIC CULTURE (SURGICAL/DEEP WOUND)

## 2017-12-21 LAB — CBC
HEMATOCRIT: 27.3 % — AB (ref 36.0–46.0)
HEMOGLOBIN: 8.6 g/dL — AB (ref 12.0–15.0)
MCH: 25.4 pg — ABNORMAL LOW (ref 26.0–34.0)
MCHC: 31.5 g/dL (ref 30.0–36.0)
MCV: 80.5 fL (ref 78.0–100.0)
Platelets: 273 10*3/uL (ref 150–400)
RBC: 3.39 MIL/uL — AB (ref 3.87–5.11)
RDW: 14.4 % (ref 11.5–15.5)
WBC: 15.7 10*3/uL — ABNORMAL HIGH (ref 4.0–10.5)

## 2017-12-21 LAB — BASIC METABOLIC PANEL
ANION GAP: 6 (ref 5–15)
BUN: <5 mg/dL — ABNORMAL LOW (ref 8–23)
CO2: 20 mmol/L — ABNORMAL LOW (ref 22–32)
Calcium: 7.2 mg/dL — ABNORMAL LOW (ref 8.9–10.3)
Chloride: 114 mmol/L — ABNORMAL HIGH (ref 98–111)
Creatinine, Ser: 1.03 mg/dL — ABNORMAL HIGH (ref 0.44–1.00)
GFR calc non Af Amer: 57 mL/min — ABNORMAL LOW (ref >60)
GLUCOSE: 97 mg/dL (ref 70–99)
POTASSIUM: 3.5 mmol/L (ref 3.5–5.1)
Sodium: 140 mmol/L (ref 135–145)

## 2017-12-21 LAB — AEROBIC/ANAEROBIC CULTURE W GRAM STAIN (SURGICAL/DEEP WOUND)

## 2017-12-21 LAB — GLUCOSE, CAPILLARY
GLUCOSE-CAPILLARY: 96 mg/dL (ref 70–99)
GLUCOSE-CAPILLARY: 118 mg/dL — AB (ref 70–99)
GLUCOSE-CAPILLARY: 100 mg/dL — AB (ref 70–99)
Glucose-Capillary: 181 mg/dL — ABNORMAL HIGH (ref 70–99)

## 2017-12-21 MED ORDER — LISINOPRIL 20 MG PO TABS
20.0000 mg | ORAL_TABLET | Freq: Every day | ORAL | Status: DC
  Administered 2017-12-21 – 2017-12-22 (×2): 20 mg via ORAL
  Filled 2017-12-21 (×2): qty 1

## 2017-12-21 NOTE — Progress Notes (Signed)
Patient ID: Sarah Brock, female   DOB: 12/19/1955, 62 y.o.   MRN: [id number]                                                                PROGRESS NOTE                                                                                                                                                                                                             Patient Demographics:    Sarah Brock, is a 62 y.o. female, DOB - [birth year], DXI:[id number]  Admit date - 12/17/2017   Admitting Physician Karmen Bongo, MD  Outpatient Primary MD for the patient is Gildardo Cranker, DO  LOS - 4  Outpatient Specialists:     Chief Complaint  Patient presents with  . Weakness       Brief Narrative     62 years old female with past medical history of diabetes mellitus, iron deficiency anemia, asthma, seizure disorder presented to hospital with 2 to 3 weeks of progressive fatigue malaise and sharp lower abdominal pain radiating towards the front of the abdomen.  Patient was using NSAIDs for pain and had  difficulty urinating and passing bowels.  Patient was noted to have acute kidney injury on arrival to the ED with a creatinine of 2.6  and was admitted to the hospital.  CT scan of the abdomen and pelvis was performed because of abdominal pain and there was evidence of abscess in the right lower quadrant.      Subjective:    Sarah Brock today has been afebrile overnite. Drain appears intact.    No headache, No chest pain, No abdominal pain - No Nausea, No new weakness tingling or numbness, No Cough - SOB.   Assessment  & Plan :    Principal Problem:   AKI (acute kidney injury) ([place]) Active Problems:   Seizure disorder ([place])   Diabetes mellitus type 2, controlled, without complications ([place])   Anemia, iron deficiency   Asthma   Leukocytosis   Elevated alkaline phosphatase level   Hyperlipidemia   Acute kidney failure ([place])   ARF secondary to volume depletion, NSAIDS, Lisinopril Tx  with IVF Check cmp in am  Fatigue weakness tiredness likely secondary to renal failure and intra-abdominal abscess. improving.  Perforated  appendicular abscess w fever Fever and chills with abdominal pain.  CT scan of the abdomen done likely perforated appendicular abscess.  -Status post drain placement by IR on 12/18/17.   - on Zosyn since 12/18/2017.   - Blood cultures negative  - check cbc in am, will need appendectomy as outpatient per prior physician  Abnormal liver function Check cmp in am Check acute hepatitis panel  Hypertension.    on PRN hydralazine.  Formerly on lisinopril.  On hold due to AKI, likely start by am.    Start lisinopril 20mg  po qday (6/29)  History of seizure disorder.   Continue on Keppra.  No active issues  Diabetes mellitus type II.   - on metformin at home, now on hold.   - Continue on sliding scale insulin.  Glucose appears to be stable.  DVT prophylaxis: heparin  Code Status: Full code  Family Communication: With the patient at bedside  Disposition Plan: Home in 2-3 days if ok with IR and surgery. Plan for antibiotics on discharge. ? Repeat scan prior to discharge.   Consultants: Surgery  Procedures:  CT-guided abscess drain 12/18/17  Antimicrobials: On Zosyn since 12/18/17    Anti-infectives (From admission, onward)   Start     Dose/Rate Route Frequency Ordered Stop   12/18/17 1000  piperacillin-tazobactam (ZOSYN) IVPB 3.375 g     3.375 g 12.5 mL/hr over 240 Minutes Intravenous Every 8 hours 12/18/17 0919     12/18/17 0915  ertapenem (INVANZ) 1,000 mg in sodium chloride 0.9 % 100 mL IVPB  Status:  Discontinued     1 g 200 mL/hr over 30 Minutes Intravenous Every 24 hours 12/18/17 0911 12/18/17 0919        Objective:   Vitals:   12/20/17 1625 12/21/17 0111 12/21/17 0609 12/21/17 0749  BP: (!) 168/92 (!) 143/78    Pulse: (!) 103 85    Resp: 20 20    Temp: 100 F (37.8 C) 98.3 F (36.8 C)  98.4 F (36.9 C)    TempSrc: Oral Oral  Oral  SpO2: 99% 99%    Weight:   79.3 kg (174 lb 14.4 oz)   Height:        Wt Readings from Last 3 Encounters:  12/21/17 79.3 kg (174 lb 14.4 oz)  12/16/17 76.2 kg (168 lb)  09/24/17 78.5 kg (173 lb)     Intake/Output Summary (Last 24 hours) at 12/21/2017 0935 Last data filed at 12/21/2017 0400 Gross per 24 hour  Intake 3569.54 ml  Output 586 ml  Net 2983.54 ml     Physical Exam  Awake Alert, Oriented X 3, No new F.N deficits, Normal affect [place].AT,PERRAL Supple Neck,No JVD, No cervical lymphadenopathy appriciated.  Symmetrical Chest wall movement, Good air movement bilaterally, CTAB RRR,No Gallops,Rubs or new Murmurs, No Parasternal Heave +ve B.Sounds, Abd Soft, No tenderness, No organomegaly appriciated, No rebound - guarding or rigidity. No Cyanosis, Clubbing or edema, No new Rash or bruise     Data Review:    CBC Recent Labs  Lab 12/16/17 1600 12/17/17 1136 12/18/17 0333 12/19/17 0306 12/20/17 0311 12/21/17 0232  WBC 17.0* 15.4* 16.1* 18.0* 14.3* 15.7*  HGB 10.7* 11.0* 10.2* 10.5* 9.5* 8.6*  HCT 33.1* 35.4* 33.1* 32.9* 29.6* 27.3*  PLT 313 298 283 307 294 273  MCV 78.3* 80.8 81.1 79.1 78.9 80.5  MCH 25.3* 25.1* 25.0* 25.2* 25.3* 25.4*  MCHC 32.3 31.1 30.8 31.9 32.1 31.5  RDW 13.8 14.1 14.4 14.2 14.4 14.4  LYMPHSABS 2,023  --   --   --   --   --   EOSABS 357  --   --   --   --   --   BASOSABS 68  --   --   --   --   --     Chemistries  Recent Labs  Lab 12/16/17 1600 12/17/17 1136 12/18/17 0333 12/19/17 0306 12/20/17 0311 12/21/17 0232  NA 137 138 138 139 137 140  K 4.3 4.6 4.2 3.7 3.2* 3.5  CL 103 107 110 109 110 114*  CO2 21 21* 17* 19* 17* 20*  GLUCOSE 173* 188* 120* 105* 160* 97  BUN 42* 32* 20 11 5* <5*  CREATININE 2.68* 1.85* 1.25* 1.15* 1.01* 1.03*  CALCIUM 8.9 9.2 8.4* 8.1* 7.3* 7.2*  AST 40* 49*  --  49* 36  --   ALT 56* 61*  --  63* 49*  --   ALKPHOS  --  190*  --  224* 187*  --   BILITOT 0.3 0.6  --  0.8 0.7   --    ------------------------------------------------------------------------------------------------------------------ No results for input(s): CHOL, HDL, LDLCALC, TRIG, CHOLHDL, LDLDIRECT in the last 72 hours.  Lab Results  Component Value Date   HGBA1C 6.8 (H) 09/24/2017   ------------------------------------------------------------------------------------------------------------------ No results for input(s): TSH, T4TOTAL, T3FREE, THYROIDAB in the last 72 hours.  Invalid input(s): FREET3 ------------------------------------------------------------------------------------------------------------------ No results for input(s): VITAMINB12, FOLATE, FERRITIN, TIBC, IRON, RETICCTPCT in the last 72 hours.  Coagulation profile Recent Labs  Lab 12/17/17 1352  INR 1.02    No results for input(s): DDIMER in the last 72 hours.  Cardiac Enzymes No results for input(s): CKMB, TROPONINI, MYOGLOBIN in the last 168 hours.  Invalid input(s): CK ------------------------------------------------------------------------------------------------------------------ No results found for: BNP  Inpatient Medications  Scheduled Meds: . feeding supplement (ENSURE ENLIVE)  237 mL Oral BID BM  . ferrous sulfate  325 mg Oral BID WC  . heparin  5,000 Units Subcutaneous Q8H  . insulin aspart  0-15 Units Subcutaneous TID WC  . insulin aspart  0-5 Units Subcutaneous QHS  . levETIRAcetam  500 mg Oral BID  . sodium chloride flush  5 mL Intracatheter Q8H   Continuous Infusions: . sodium chloride 125 mL/hr at 12/21/17 0352  . piperacillin-tazobactam (ZOSYN)  IV 3.375 g (12/21/17 0212)   PRN Meds:.acetaminophen **OR** acetaminophen, bisacodyl, hydrALAZINE, HYDROcodone-acetaminophen, ondansetron **OR** ondansetron (ZOFRAN) IV, senna-docusate  Micro Results Recent Results (from the past 240 hour(s))  Culture, blood (routine x 2)     Status: None (Preliminary result)   Collection Time: 12/18/17 10:50 AM    Result Value Ref Range Status   Specimen Description BLOOD RIGHT ANTECUBITAL  Final   Special Requests   Final    BOTTLES DRAWN AEROBIC ONLY Blood Culture adequate volume   Culture   Final    NO GROWTH 3 DAYS Performed at [facility], [place] [address], [place], [place] [address]    Report Status PENDING  Incomplete  Culture, blood (routine x 2)     Status: None (Preliminary result)   Collection Time: 12/18/17 11:00 AM  Result Value Ref Range Status   Specimen Description BLOOD LEFT HAND  Final   Special Requests   Final    BOTTLES DRAWN AEROBIC ONLY Blood Culture adequate volume   Culture   Final    NO GROWTH 3 DAYS Performed at [facility], [place] [address] [place],  [address]    Report Status  PENDING  Incomplete  Culture, Urine     Status: Abnormal   Collection Time: 12/18/17 12:25 PM  Result Value Ref Range Status   Specimen Description URINE, RANDOM  Final   Special Requests   Final    NONE Performed at [facility], [place] [address] [place], [place] [address]    Culture 10,000 COLONIES/mL ESCHERICHIA COLI (A)  Final   Report Status 12/20/2017 FINAL  Final   Organism ID, Bacteria ESCHERICHIA COLI (A)  Final      Susceptibility   Escherichia coli - MIC*    AMPICILLIN >=32 RESISTANT Resistant     CEFAZOLIN <=4 SENSITIVE Sensitive     CEFTRIAXONE <=1 SENSITIVE Sensitive     CIPROFLOXACIN <=0.25 SENSITIVE Sensitive     GENTAMICIN <=1 SENSITIVE Sensitive     IMIPENEM <=0.25 SENSITIVE Sensitive     NITROFURANTOIN <=16 SENSITIVE Sensitive     TRIMETH/SULFA <=20 SENSITIVE Sensitive     AMPICILLIN/SULBACTAM >=32 RESISTANT Resistant     PIP/TAZO <=4 SENSITIVE Sensitive     Extended ESBL NEGATIVE Sensitive     * 10,000 COLONIES/mL ESCHERICHIA COLI  Aerobic/Anaerobic Culture (surgical/deep wound)     Status: None (Preliminary result)   Collection Time: 12/19/17  3:25 PM  Result Value Ref Range Status   Specimen Description ABSCESS RIGHT LOWER ABDOMEN   Final   Special Requests POST CT GUIDED DRAIN PLACEMENT  Final   Gram Stain   Final    ABUNDANT WBC PRESENT, PREDOMINANTLY PMN ABUNDANT GRAM VARIABLE ROD ABUNDANT GRAM NEGATIVE RODS MODERATE GRAM POSITIVE COCCI    Culture   Final    CULTURE REINCUBATED FOR BETTER GROWTH Performed at [facility], [address] [place], [place] [address]    Report Status PENDING  Incomplete    Radiology Reports Ct Abdomen Pelvis Wo Contrast  Result Date: 12/17/2017 CLINICAL DATA:  62 year old female with acute abdominal pain, acute renal failure and elevated LFTs. EXAM: CT ABDOMEN AND PELVIS WITHOUT CONTRAST TECHNIQUE: Multidetector CT imaging of the abdomen and pelvis was performed following the standard protocol without IV contrast. COMPARISON:  05/23/2010 CT FINDINGS: Please note that parenchymal abnormalities may be missed without intravenous contrast. Lower chest: No acute abnormality.  Mild cardiomegaly identified. Hepatobiliary: No hepatic abnormalities identified. The patient is status post cholecystectomy. No biliary dilatation. Pancreas: Unremarkable Spleen: Unremarkable Adrenals/Urinary Tract: The kidneys, adrenal glands and bladder are unremarkable. Stomach/Bowel: A 4 x 6 x 5 cm thick-walled fluid collection containing gas is identified within the LOWER RIGHT pelvis likely representing abscess. The origin of this probable abscess is difficult to determine but there is a mildly enlarged adjacent appendix noted. A RIGHT tubo-ovarian abscess or small bowel/colonic origin are considerations as these structures lie in this area. There is a 1 cm calcification within this fluid/gas collection could represent an appendicolith. There is no evidence of bowel obstruction or other areas of bowel wall thickening. Colonic diverticulosis is identified. Vascular/Lymphatic: No significant vascular findings are present. No enlarged abdominal or pelvic lymph nodes. Reproductive: Status post hysterectomy.  No LEFT  adnexal masses. Other: A trace amount of free pelvic fluid is noted. No pneumoperitoneum. Musculoskeletal: No acute or suspicious bony abnormalities. Degenerative changes in the lumbar spine are identified. IMPRESSION: 1. 4 x 6 x 5 cm probable abscess within the LOWER RIGHT pelvis, of uncertain origin. Considerations include ruptured appendicitis as there is a mildly enlarged adjacent appendix noted. RIGHT tubo-ovarian abscess or abscess from adjacent small bowel/colon are also considerations. 2. No  pneumoperitoneum or bowel obstruction. 3. Mild cardiomegaly. Electronically Signed   By: Margarette Canada M.D.   On: 12/17/2017 17:43   Dg Chest 2 View  Result Date: 12/17/2017 CLINICAL DATA:  Cough.  Chest discomfort. EXAM: CHEST - 2 VIEW COMPARISON:  12/18/2015 FINDINGS: There is new mild cardiomegaly. Pulmonary vascularity is normal and the lungs are clear. No effusions. Bones appear normal. IMPRESSION: New mild cardiomegaly. Electronically Signed   By: Lorriane Shire M.D.   On: 12/17/2017 11:24   Ct Image Guided Drainage By Percutaneous Catheter  Result Date: 12/19/2017 INDICATION: Right lower quadrant abdominal abscess worrisome for ruptured appendicitis in the setting of suspected appendicolith. Please perform CT-guided drainage catheter placement for infection source control purposes. EXAM: CT IMAGE GUIDED DRAINAGE BY PERCUTANEOUS CATHETER COMPARISON:  CT abdomen and pelvis - 12/17/2017 MEDICATIONS: The patient is currently admitted to the hospital and receiving intravenous antibiotics. The antibiotics were administered within an appropriate time frame prior to the initiation of the procedure. ANESTHESIA/SEDATION: Moderate (conscious) sedation was employed during this procedure. A total of Versed 1.5 mg and Fentanyl 75 mcg was administered intravenously. Moderate Sedation Time: 17 minutes. The patient's level of consciousness and vital signs were monitored continuously by radiology nursing throughout the  procedure under my direct supervision. CONTRAST:  None COMPLICATIONS: None immediate. PROCEDURE: Informed written consent was obtained from the patient after a discussion of the risks, benefits and alternatives to treatment. The patient was placed supine on the CT gantry and a pre procedural CT was performed re-demonstrating the known abscess/fluid collection within the right lower abdominal quadrant with dominant component measuring at least 6.8 x 4.5 cm (image 29, series 2). The procedure was planned. A timeout was performed prior to the initiation of the procedure. The skin overlying the ventral aspect of the right lower abdomen/pelvis was prepped and draped in the usual sterile fashion. The overlying soft tissues were anesthetized with 1% lidocaine with epinephrine. Appropriate trajectory was planned with the use of a 22 gauge spinal needle. An 18 gauge trocar needle was advanced into the abscess/fluid collection and a short Amplatz super stiff wire was coiled within the collection. Appropriate positioning was confirmed with a limited CT scan. The tract was serially dilated allowing placement of a 10 [place] all-purpose drainage catheter. Appropriate positioning was confirmed with a limited postprocedural CT scan. Approximately 60 ml of purulent fluid was aspirated. The tube was connected to a drainage bag and sutured in place. A dressing was placed. The patient tolerated the procedure well without immediate post procedural complication. IMPRESSION: Successful CT guided placement of a 10 French all purpose drain catheter into the right lower abdominal/pelvic abscess with aspiration of 60 mL of purulent fluid. Samples were sent to the laboratory as requested by the ordering clinical team. Electronically Signed   By: Sandi Mariscal M.D.   On: 12/19/2017 15:45    Time Spent in minutes  30   Jani Gravel M.D on 12/21/2017 at 9:35 AM  Between 7am to 7pm - Pager - [phone]    After 7pm go to [email or web address] -  password [facility]  [facility] -  Office  [phone]

## 2017-12-21 NOTE — Progress Notes (Addendum)
Pt's BP prior to Lisinopril dose was 195/101.  Patient had been up washing and brushing her teeth. Paged Dr. Maudie Mercury and will recheck in 15-30 minutes.  Spoke with Dr. Maudie Mercury.   If the BP is still elevated, Dr. Maudie Mercury has written for a prn BP med. Patient denies any headache, chest pain, dizziness, or visual disturbances.   Patient received Hydralazine for continued elevated BP with positive effect.

## 2017-12-21 NOTE — Progress Notes (Signed)
Subjective/Chief Complaint: No real abd pain except at drain site, having bowel function, eating   Objective: Vital signs in last 24 hours: Temp:  [98.3 F (36.8 C)-100 F (37.8 C)] 98.4 F (36.9 C) (06/29 0749) Pulse Rate:  [78-103] 85 (06/29 0111) Resp:  [18-20] 20 (06/29 0111) BP: (143-173)/(78-92) 143/78 (06/29 0111) SpO2:  [98 %-99 %] 99 % (06/29 0111) Weight:  [79.3 kg (174 lb 14.4 oz)] 79.3 kg (174 lb 14.4 oz) (06/29 0609) Last BM Date: 12/21/17  Intake/Output from previous day: 06/28 0701 - 06/29 0700 In: 3809.5 [P.O.:720; I.V.:2967.6; IV Piggyback:116.9] Out: 586 [Urine:500; Drains:85; Stool:1] Intake/Output this shift: No intake/output data recorded.  General appearance: no distress Resp: clear to auscultation bilaterally Cardio: regular rate and rhythm GI: soft nontender except at drain site nondistended bs present drain with murky fluid  Lab Results:  Recent Labs    12/20/17 0311 12/21/17 0232  WBC 14.3* 15.7*  HGB 9.5* 8.6*  HCT 29.6* 27.3*  PLT 294 273   BMET Recent Labs    12/20/17 0311 12/21/17 0232  NA 137 140  K 3.2* 3.5  CL 110 114*  CO2 17* 20*  GLUCOSE 160* 97  BUN 5* <5*  CREATININE 1.01* 1.03*  CALCIUM 7.3* 7.2*   PT/INR No results for input(s): LABPROT, INR in the last 72 hours. ABG No results for input(s): PHART, HCO3 in the last 72 hours.  Invalid input(s): PCO2, PO2  Studies/Results: Ct Image Guided Drainage By Percutaneous Catheter  Result Date: 12/19/2017 INDICATION: Right lower quadrant abdominal abscess worrisome for ruptured appendicitis in the setting of suspected appendicolith. Please perform CT-guided drainage catheter placement for infection source control purposes. EXAM: CT IMAGE GUIDED DRAINAGE BY PERCUTANEOUS CATHETER COMPARISON:  CT abdomen and pelvis - 12/17/2017 MEDICATIONS: The patient is currently admitted to the hospital and receiving intravenous antibiotics. The antibiotics were administered within an  appropriate time frame prior to the initiation of the procedure. ANESTHESIA/SEDATION: Moderate (conscious) sedation was employed during this procedure. A total of Versed 1.5 mg and Fentanyl 75 mcg was administered intravenously. Moderate Sedation Time: 17 minutes. The patient's level of consciousness and vital signs were monitored continuously by radiology nursing throughout the procedure under my direct supervision. CONTRAST:  None COMPLICATIONS: None immediate. PROCEDURE: Informed written consent was obtained from the patient after a discussion of the risks, benefits and alternatives to treatment. The patient was placed supine on the CT gantry and a pre procedural CT was performed re-demonstrating the known abscess/fluid collection within the right lower abdominal quadrant with dominant component measuring at least 6.8 x 4.5 cm (image 29, series 2). The procedure was planned. A timeout was performed prior to the initiation of the procedure. The skin overlying the ventral aspect of the right lower abdomen/pelvis was prepped and draped in the usual sterile fashion. The overlying soft tissues were anesthetized with 1% lidocaine with epinephrine. Appropriate trajectory was planned with the use of a 22 gauge spinal needle. An 18 gauge trocar needle was advanced into the abscess/fluid collection and a short Amplatz super stiff wire was coiled within the collection. Appropriate positioning was confirmed with a limited CT scan. The tract was serially dilated allowing placement of a 10 [place] all-purpose drainage catheter. Appropriate positioning was confirmed with a limited postprocedural CT scan. Approximately 60 ml of purulent fluid was aspirated. The tube was connected to a drainage bag and sutured in place. A dressing was placed. The patient tolerated the procedure well without immediate post procedural complication. IMPRESSION: Successful  CT guided placement of a 10 [place] all purpose drain catheter into the right  lower abdominal/pelvic abscess with aspiration of 60 mL of purulent fluid. Samples were sent to the laboratory as requested by the ordering clinical team. Electronically Signed   By: Sandi Mariscal M.D.   On: 12/19/2017 15:45    Anti-infectives: Anti-infectives (From admission, onward)   Start     Dose/Rate Route Frequency Ordered Stop   12/18/17 1000  piperacillin-tazobactam (ZOSYN) IVPB 3.375 g     3.375 g 12.5 mL/hr over 240 Minutes Intravenous Every 8 hours 12/18/17 0919     12/18/17 0915  ertapenem (INVANZ) 1,000 mg in sodium chloride 0.9 % 100 mL IVPB  Status:  Discontinued     1 g 200 mL/hr over 30 Minutes Intravenous Every 24 hours 12/18/17 0911 12/18/17 0919      Assessment/Plan: RLQ abscess, suspectedperforated appendicitis -CT 6/25 showed4 x 6 x 5 cm probable abscess within the LOWER RIGHT pelvis - S/P RLQ IR perc drain 6/27, GS w/ GNR, GVR, G+cocci, follow CX/sensitivities - wbc up a little today, afebrile, will continue iv abx today and recheck wbc. If this continues to rise or has fever she will need repeat ct scan. If better tomorrow and continues as she is clinically possibly change oral abx and home. - SOFT diet  - mobilize TID, IS  ID: Zosyn 6/26 >> VTE: SCD's,heparin Foley: none Follow up: IR drain clinic, Dr. Georgette Dover to discuss interval appendectomy if improves     Rolm Bookbinder 12/21/2017

## 2017-12-22 DIAGNOSIS — G40909 Epilepsy, unspecified, not intractable, without status epilepticus: Secondary | ICD-10-CM

## 2017-12-22 DIAGNOSIS — R748 Abnormal levels of other serum enzymes: Secondary | ICD-10-CM

## 2017-12-22 LAB — COMPREHENSIVE METABOLIC PANEL
ALBUMIN: 2.1 g/dL — AB (ref 3.5–5.0)
ALK PHOS: 193 U/L — AB (ref 38–126)
ALT: 57 U/L — AB (ref 0–44)
AST: 42 U/L — ABNORMAL HIGH (ref 15–41)
Anion gap: 10 (ref 5–15)
BUN: <5 mg/dL — ABNORMAL LOW (ref 8–23)
CALCIUM: 7.4 mg/dL — AB (ref 8.9–10.3)
CHLORIDE: 113 mmol/L — AB (ref 98–111)
CO2: 19 mmol/L — AB (ref 22–32)
CREATININE: 1.05 mg/dL — AB (ref 0.44–1.00)
GFR calc Af Amer: >60 mL/min (ref >60)
GFR calc non Af Amer: 56 mL/min — ABNORMAL LOW (ref >60)
GLUCOSE: 130 mg/dL — AB (ref 70–99)
Potassium: 3.1 mmol/L — ABNORMAL LOW (ref 3.5–5.1)
SODIUM: 142 mmol/L (ref 135–145)
Total Bilirubin: 0.6 mg/dL (ref 0.3–1.2)
Total Protein: 6.3 g/dL — ABNORMAL LOW (ref 6.5–8.1)

## 2017-12-22 LAB — CBC
HCT: 31.1 % — ABNORMAL LOW (ref 36.0–46.0)
Hemoglobin: 9.8 g/dL — ABNORMAL LOW (ref 12.0–15.0)
MCH: 25.2 pg — ABNORMAL LOW (ref 26.0–34.0)
MCHC: 31.5 g/dL (ref 30.0–36.0)
MCV: 79.9 fL (ref 78.0–100.0)
PLATELETS: 334 10*3/uL (ref 150–400)
RBC: 3.89 MIL/uL (ref 3.87–5.11)
RDW: 14.6 % (ref 11.5–15.5)
WBC: 14.1 10*3/uL — AB (ref 4.0–10.5)

## 2017-12-22 LAB — GLUCOSE, CAPILLARY
Glucose-Capillary: 132 mg/dL — ABNORMAL HIGH (ref 70–99)
Glucose-Capillary: 124 mg/dL — ABNORMAL HIGH (ref 70–99)

## 2017-12-22 MED ORDER — POTASSIUM CHLORIDE CRYS ER 20 MEQ PO TBCR
20.0000 meq | EXTENDED_RELEASE_TABLET | Freq: Once | ORAL | Status: AC
  Administered 2017-12-22: 20 meq via ORAL
  Filled 2017-12-22: qty 1

## 2017-12-22 MED ORDER — AMOXICILLIN-POT CLAVULANATE 875-125 MG PO TABS: 1.0000 | ORAL_TABLET | Freq: Two times a day (BID) | ORAL | 0 refills | Status: AC

## 2017-12-22 MED ORDER — ENSURE ENLIVE PO LIQD: 237.0000 mL | Freq: Two times a day (BID) | ORAL | 12 refills | Status: DC

## 2017-12-22 MED ORDER — ONDANSETRON HCL 4 MG PO TABS: 4.0000 mg | ORAL_TABLET | Freq: Four times a day (QID) | ORAL | 0 refills | Status: DC | PRN

## 2017-12-22 MED ORDER — HYDROCODONE-ACETAMINOPHEN 5-325 MG PO TABS: 1.0000 | ORAL_TABLET | ORAL | 0 refills | Status: DC | PRN

## 2017-12-22 MED ORDER — POTASSIUM CHLORIDE CRYS ER 20 MEQ PO TBCR
40.0000 meq | EXTENDED_RELEASE_TABLET | Freq: Once | ORAL | Status: AC
  Administered 2017-12-22: 20 meq via ORAL
  Filled 2017-12-22: qty 2

## 2017-12-22 MED ORDER — INSULIN ASPART 100 UNIT/ML ~~LOC~~ SOLN
0.0000 [IU] | Freq: Three times a day (TID) | SUBCUTANEOUS | Status: DC
  Administered 2017-12-22: 2 [IU] via SUBCUTANEOUS

## 2017-12-22 MED ORDER — VSL#3 PO CAPS: 2.0000 | ORAL_CAPSULE | Freq: Every day | ORAL | 0 refills | Status: DC

## 2017-12-22 NOTE — Discharge Summary (Signed)
Sarah Brock, is a 62 y.o. female  DOB 1955-09-09  MRN [id number].  Admission date:  12/17/2017  Admitting Physician  Karmen Bongo, MD  Discharge Date:  12/22/2017   Primary MD  Gildardo Cranker, DO  Recommendations for primary care physician for things to follow:     ARF secondary to volume depletion, NSAIDS, Lisinopril STOP Naproxen Resumed lisinopril Check cmp in 1 week with pcp    Perforated appendicular abscess w fever CT scan of the abdomen (6/25)   likely perforated appendicular abscess.  Status post drain placement by IR on 12/18/17.  Zosyn since6/26/2019.=> 6/30  Blood cultures (6/26)  negative  F/u with IR in 9 days regarding drain F/u with surgery in 4 weeks for outpatient appendectomy Augmentin 875mg  po bid x 7 days Check cbc in 1 week with pcp  Hypokalemia repleted Check cmp in 1 week as above  Abnormal liver function Check cmp in 1 week as above  Hypertension.  Resume home bp medications  History of seizure disorder.  Continue on Keppra. No active issues  Diabetes mellitus type II.  Resume metformin      Admission Diagnosis  Cardiomegaly [I51.7] Elevated liver enzymes [R74.8] Acute kidney injury ([place]) [N17.9] Fatigue, unspecified type [R53.83] Leukocytosis, unspecified type [D72.829] Elevated blood pressure reading in office with diagnosis of hypertension [I10]   Discharge Diagnosis  Cardiomegaly [I51.7] Elevated liver enzymes [R74.8] Acute kidney injury ([place]) [N17.9] Fatigue, unspecified type [R53.83] Leukocytosis, unspecified type [D72.829] Elevated blood pressure reading in office with diagnosis of hypertension [I10]     Principal Problem:   AKI (acute kidney injury) ([place]) Active Problems:   Seizure disorder ([place])   Diabetes mellitus type 2, controlled, without complications ([place])   Anemia, iron deficiency   Asthma  Leukocytosis   Elevated alkaline phosphatase level   Hyperlipidemia   Acute kidney failure ([place])   Perforated appendicitis      Past Medical History:  Diagnosis Date  . Anemia, unspecified   . Asthma    as a child  . Diabetes mellitus    Type II  . History of blood transfusion    pre hysterectomy  . Osteoarthrosis, unspecified whether generalized or localized, unspecified site   . Other and unspecified hyperlipidemia   . Other convulsions    last one 2013  . Other malaise and fatigue   . Other specified cardiac dysrhythmias(427.89) 2006   "history of Bradycardia" .  wore a mointor nothing found  . Seizure disorder ([place])   . Unspecified essential hypertension   . Unspecified vitamin D deficiency     Past Surgical History:  Procedure Laterality Date  . ABDOMINAL HYSTERECTOMY    . BREAST LUMPECTOMY Right 1997  . CHOLECYSTECTOMY N/A 02/28/2016   Procedure: LAPAROSCOPIC CHOLECYSTECTOMY;  Surgeon: Arta Bruce Kinsinger, MD;  Location: [facility];  Service: General;  Laterality: N/A;       HPI  from the history and physical done on the day of admission:   62 y.o. female diabetes, history of  iron deficiency anemia, asthma, seizure disorder on Keppra, presenting with 2 to 3-week history of progressive worsening fatigue, and malaise, treated by upper respiratory infection felt to be viral.  About 2 weeks ago, the patient began to experience sharp lower back pain for which she has been taking significant amount of NSAIDs with Motrin, and she also takes naproxen twice daily 500 mg for "muscle aches ".  For about 1 week, she noted changes in the color of her urine, dysuria without urgency and frequency.  She denies any nausea or vomiting or diarrhea she has subjective fevers, chills, but denies any cough.  She denies any chest pain or palpitations.  She denies any increasing shortness of breath or wheezing She denies any gait instability.  She denies any unilateral weakness.  She denies any lower  extremity swelling.  Denies any tobacco, alcohol or recreational drug use.  She does take ACE inhibitor daily dose this morning.  She was seen by her PCP yesterday, who noted that her creatinine was 2.68, and asked her to be seen at the emergency department.  Her coworkers today, suggested that she be seen, in view of her symptoms, and these abnormal labs.   ED Course:  BP (!) 150/86   Pulse 86   Temp 98.6 F (37 C) (Oral) Comment: Simultaneous filing. User may not have seen previous data.  Resp (!) 24   Ht 5\' 2"  (1.575 m)   Wt 76.2 kg (168 lb)   SpO2 99%   BMI 30.73 kg/m   At the ED, the patient was noted to have a creatinine of 1.85 (was 2.68 yesterday), with a BUN of 32, was 42 yesterday Alkaline phosphatase 190 Total bilirubin 0.6, AST 49, ALT 61 Glucose 188 White count 15.4 was 17 yesterday  Iron studies yesterday show iron of 22, TIBC 229, percentage saturation 10, ferritin 170 Chest x-ray EKG shows sinus rhythm, probable left atrial enlargement, and ST elevation, which are new from EKG in 2017 2D echo in 2011 was normal, with normal EF. Chest x-ray was remarkable for mild cardiomegaly Troponin is pending        Hospital Course:      Pt was admitted for evaluation of Acute renal failure thought to be secondary to nsaids as well as lisinopril use.  Pt was counselled to use tylenol and not naproxen and ibuprofen.  Metformin was held in the setting of renal failurePt CT scan abd/ pelvis (6/25)  showed a 4x6x5cm fluid collection likely abscess thought to be secondary to perforated appendix.  Surgery consulted and patient started on Zosyn. IR drain placed on 6/27 .  Blood culture (6/26) no growth.  Urine culture (6/26) resistant E. Coli.  Sensitive to zosyn.  Iron studies on 6/24 showed iron saturation of 10%, but ferritin 170.    Pt has been afebrile and tolerating diabetic diet.  Pt seen today by surgery who thought patient stable and can go home on oral abx (augmentin) as well  as f/u with IR in 9 days and f/u with Dr. Georgette Dover in 4 weeks for appendectomy.  Creatinine on day of discharge 1.05.     Follow UP  Follow-up Information    Sandi Mariscal, MD Follow up in 9 day(s).   Specialties:  Interventional Radiology, Radiology Why:  pt will hear from scheduler for follow up with Dr Pascal Lux in [facility]-- call [phone] if questions or concerns Contact information: [address] [place] [address] [phone]  Donnie Mesa, MD. Schedule an appointment as soon as possible for a visit in 4 week(s).   Specialty:  General Surgery Why:  for follow up regarding recent appendicitis. Contact information: [place] [address] [phone]            Consults obtained - IR, Surgery  Discharge Condition: stable  Diet and Activity recommendation: See Discharge Instructions below  Discharge Instructions          Discharge Medications     Allergies as of 12/22/2017      Reactions   Simvastatin Other (See Comments)   Stomach pain, low back pain    Shrimp [shellfish Allergy] Other (See Comments)   Swelling and hypersensitivity      Medication List    STOP taking these medications   naproxen sodium 220 MG tablet Commonly known as:  ALEVE     TAKE these medications   amoxicillin-clavulanate 875-125 MG tablet Commonly known as:  AUGMENTIN Take 1 tablet by mouth 2 (two) times daily for 7 days.   cetirizine 10 MG tablet Commonly known as:  ZYRTEC Take 10 mg by mouth at bedtime.   ezetimibe 10 MG tablet Commonly known as:  ZETIA Take 1 tablet (10 mg total) by mouth daily.   feeding supplement (ENSURE ENLIVE) Liqd Take 237 mLs by mouth 2 (two) times daily between meals.   ferrous sulfate 325 (65 FE) MG tablet Take 1 tablet (325 mg total) by mouth 2 (two) times daily with a meal.   glucose blood test strip Use as instructed   HYDROcodone-acetaminophen 5-325 MG tablet Commonly known as:   NORCO/VICODIN Take 1-2 tablets by mouth every 4 (four) hours as needed for moderate pain.   levETIRAcetam 500 MG tablet Commonly known as:  KEPPRA TAKE ONE TABLET BY MOUTH EVERY 12 HOURS FOR SEIZURES   lisinopril 30 MG tablet Commonly known as:  PRINIVIL,ZESTRIL TAKE 1 TABLET BY MOUTH ONCE DAILY FOR BLOOD PRESSURE What changed:    how much to take  how to take this  when to take this  additional instructions   metFORMIN 1000 MG tablet Commonly known as:  GLUCOPHAGE TAKE ONE TABLET BY MOUTH TWICE DAILY WITH MEALS TO CONTROL BLOOD SUGAR   ondansetron 4 MG tablet Commonly known as:  ZOFRAN Take 1 tablet (4 mg total) by mouth every 6 (six) hours as needed for nausea.   VSL#3 Caps Take 2 capsules by mouth daily.       Major procedures and Radiology Reports - PLEASE review detailed and final reports for all details, in brief -       Ct Abdomen Pelvis Wo Contrast  Result Date: 12/17/2017 CLINICAL DATA:  62 year old female with acute abdominal pain, acute renal failure and elevated LFTs. EXAM: CT ABDOMEN AND PELVIS WITHOUT CONTRAST TECHNIQUE: Multidetector CT imaging of the abdomen and pelvis was performed following the standard protocol without IV contrast. COMPARISON:  05/23/2010 CT FINDINGS: Please note that parenchymal abnormalities may be missed without intravenous contrast. Lower chest: No acute abnormality.  Mild cardiomegaly identified. Hepatobiliary: No hepatic abnormalities identified. The patient is status post cholecystectomy. No biliary dilatation. Pancreas: Unremarkable Spleen: Unremarkable Adrenals/Urinary Tract: The kidneys, adrenal glands and bladder are unremarkable. Stomach/Bowel: A 4 x 6 x 5 cm thick-walled fluid collection containing gas is identified within the LOWER RIGHT pelvis likely representing abscess. The origin of this probable abscess is difficult to determine but there is a mildly enlarged adjacent appendix noted. A RIGHT tubo-ovarian abscess  or small  bowel/colonic origin are considerations as these structures lie in this area. There is a 1 cm calcification within this fluid/gas collection could represent an appendicolith. There is no evidence of bowel obstruction or other areas of bowel wall thickening. Colonic diverticulosis is identified. Vascular/Lymphatic: No significant vascular findings are present. No enlarged abdominal or pelvic lymph nodes. Reproductive: Status post hysterectomy.  No LEFT adnexal masses. Other: A trace amount of free pelvic fluid is noted. No pneumoperitoneum. Musculoskeletal: No acute or suspicious bony abnormalities. Degenerative changes in the lumbar spine are identified. IMPRESSION: 1. 4 x 6 x 5 cm probable abscess within the LOWER RIGHT pelvis, of uncertain origin. Considerations include ruptured appendicitis as there is a mildly enlarged adjacent appendix noted. RIGHT tubo-ovarian abscess or abscess from adjacent small bowel/colon are also considerations. 2. No pneumoperitoneum or bowel obstruction. 3. Mild cardiomegaly. Electronically Signed   By: Margarette Canada M.D.   On: 12/17/2017 17:43   Dg Chest 2 View  Result Date: 12/17/2017 CLINICAL DATA:  Cough.  Chest discomfort. EXAM: CHEST - 2 VIEW COMPARISON:  12/18/2015 FINDINGS: There is new mild cardiomegaly. Pulmonary vascularity is normal and the lungs are clear. No effusions. Bones appear normal. IMPRESSION: New mild cardiomegaly. Electronically Signed   By: Lorriane Shire M.D.   On: 12/17/2017 11:24   Ct Image Guided Drainage By Percutaneous Catheter  Result Date: 12/19/2017 INDICATION: Right lower quadrant abdominal abscess worrisome for ruptured appendicitis in the setting of suspected appendicolith. Please perform CT-guided drainage catheter placement for infection source control purposes. EXAM: CT IMAGE GUIDED DRAINAGE BY PERCUTANEOUS CATHETER COMPARISON:  CT abdomen and pelvis - 12/17/2017 MEDICATIONS: The patient is currently admitted to the hospital and receiving  intravenous antibiotics. The antibiotics were administered within an appropriate time frame prior to the initiation of the procedure. ANESTHESIA/SEDATION: Moderate (conscious) sedation was employed during this procedure. A total of Versed 1.5 mg and Fentanyl 75 mcg was administered intravenously. Moderate Sedation Time: 17 minutes. The patient's level of consciousness and vital signs were monitored continuously by radiology nursing throughout the procedure under my direct supervision. CONTRAST:  None COMPLICATIONS: None immediate. PROCEDURE: Informed written consent was obtained from the patient after a discussion of the risks, benefits and alternatives to treatment. The patient was placed supine on the CT gantry and a pre procedural CT was performed re-demonstrating the known abscess/fluid collection within the right lower abdominal quadrant with dominant component measuring at least 6.8 x 4.5 cm (image 29, series 2). The procedure was planned. A timeout was performed prior to the initiation of the procedure. The skin overlying the ventral aspect of the right lower abdomen/pelvis was prepped and draped in the usual sterile fashion. The overlying soft tissues were anesthetized with 1% lidocaine with epinephrine. Appropriate trajectory was planned with the use of a 22 gauge spinal needle. An 18 gauge trocar needle was advanced into the abscess/fluid collection and a short Amplatz super stiff wire was coiled within the collection. Appropriate positioning was confirmed with a limited CT scan. The tract was serially dilated allowing placement of a 10 [place] all-purpose drainage catheter. Appropriate positioning was confirmed with a limited postprocedural CT scan. Approximately 60 ml of purulent fluid was aspirated. The tube was connected to a drainage bag and sutured in place. A dressing was placed. The patient tolerated the procedure well without immediate post procedural complication. IMPRESSION: Successful CT guided  placement of a 10 French all purpose drain catheter into the right lower abdominal/pelvic abscess with aspiration of 60  mL of purulent fluid. Samples were sent to the laboratory as requested by the ordering clinical team. Electronically Signed   By: Sandi Mariscal M.D.   On: 12/19/2017 15:45    Micro Results      Recent Results (from the past 240 hour(s))  Culture, blood (routine x 2)     Status: None (Preliminary result)   Collection Time: 12/18/17 10:50 AM  Result Value Ref Range Status   Specimen Description BLOOD RIGHT ANTECUBITAL  Final   Special Requests   Final    BOTTLES DRAWN AEROBIC ONLY Blood Culture adequate volume   Culture   Final    NO GROWTH 3 DAYS Performed at [facility], [address], [place], [place] [address]    Report Status PENDING  Incomplete  Culture, blood (routine x 2)     Status: None (Preliminary result)   Collection Time: 12/18/17 11:00 AM  Result Value Ref Range Status   Specimen Description BLOOD LEFT HAND  Final   Special Requests   Final    BOTTLES DRAWN AEROBIC ONLY Blood Culture adequate volume   Culture   Final    NO GROWTH 3 DAYS Performed at [facility], [place] [address] [place], [place] [address]    Report Status PENDING  Incomplete  Culture, Urine     Status: Abnormal   Collection Time: 12/18/17 12:25 PM  Result Value Ref Range Status   Specimen Description URINE, RANDOM  Final   Special Requests   Final    NONE Performed at [facility], [place] [address] [place], [place] [address]    Culture 10,000 COLONIES/mL ESCHERICHIA COLI (A)  Final   Report Status 12/20/2017 FINAL  Final   Organism ID, Bacteria ESCHERICHIA COLI (A)  Final      Susceptibility   Escherichia coli - MIC*    AMPICILLIN >=32 RESISTANT Resistant     CEFAZOLIN <=4 SENSITIVE Sensitive     CEFTRIAXONE <=1 SENSITIVE Sensitive     CIPROFLOXACIN <=0.25 SENSITIVE Sensitive     GENTAMICIN <=1 SENSITIVE Sensitive     IMIPENEM <=0.25 SENSITIVE Sensitive       NITROFURANTOIN <=16 SENSITIVE Sensitive     TRIMETH/SULFA <=20 SENSITIVE Sensitive     AMPICILLIN/SULBACTAM >=32 RESISTANT Resistant     PIP/TAZO <=4 SENSITIVE Sensitive     Extended ESBL NEGATIVE Sensitive     * 10,000 COLONIES/mL ESCHERICHIA COLI  Aerobic/Anaerobic Culture (surgical/deep wound)     Status: Abnormal   Collection Time: 12/19/17  3:25 PM  Result Value Ref Range Status   Specimen Description ABSCESS RIGHT LOWER ABDOMEN  Final   Special Requests POST CT GUIDED DRAIN PLACEMENT  Final   Gram Stain   Final    ABUNDANT WBC PRESENT, PREDOMINANTLY PMN ABUNDANT GRAM VARIABLE ROD ABUNDANT GRAM NEGATIVE RODS MODERATE GRAM POSITIVE COCCI Performed at [facility], [place] [address] [place], [place] [address]    Culture (A)  Final    MULTIPLE ORGANISMS PRESENT, NONE PREDOMINANT MIXED ANAEROBIC FLORA PRESENT.  CALL LAB IF FURTHER IID REQUIRED.    Report Status 12/21/2017 FINAL  Final       Today   Subjective    Charlean Carneal today has been afebrile. Tolerating solid food, had normal BM yesterday.   no headache,no chest, no  abdominal pain,no new weakness tingling or numbness, feels much better wants to go home today.     Objective   Blood pressure 136/74, pulse 95, temperature 98.2  F (36.8 C), temperature source Oral, resp. rate 20, height 5\' 2"  (1.575 m), weight 81.5 kg (179 lb 10.8 oz), SpO2 100 %.   Intake/Output Summary (Last 24 hours) at 12/22/2017 1115 Last data filed at 12/22/2017 1048 Gross per 24 hour  Intake 705.08 ml  Output 40 ml  Net 665.08 ml    Exam Awake Alert, Oriented x 3, No new F.N deficits, Normal affect [place].AT,PERRAL Supple Neck,No JVD, No cervical lymphadenopathy appriciated.  Symmetrical Chest wall movement, Good air movement bilaterally, CTAB RRR,No Gallops,Rubs or new Murmurs, No Parasternal Heave +ve B.Sounds, Abd Soft, Non tender, No organomegaly appriciated, No rebound -guarding or rigidity. No Cyanosis, Clubbing or  edema, No new Rash or bruise   Data Review   CBC w Diff:  Lab Results  Component Value Date   WBC 14.1 (H) 12/22/2017   HGB 9.8 (L) 12/22/2017   HGB 11.5 02/21/2015   HCT 31.1 (L) 12/22/2017   HCT 35.2 02/21/2015   PLT 334 12/22/2017   PLT 270 02/21/2015   LYMPHOPCT 33 09/26/2016   MONOPCT 8.1 12/16/2017   EOSPCT 2.1 12/16/2017   BASOPCT 0.4 12/16/2017    CMP:  Lab Results  Component Value Date   NA 142 12/22/2017   NA 139 09/14/2015   K 3.1 (L) 12/22/2017   CL 113 (H) 12/22/2017   CO2 19 (L) 12/22/2017   BUN <5 (L) 12/22/2017   BUN 11 09/14/2015   CREATININE 1.05 (H) 12/22/2017   CREATININE 2.68 (H) 12/16/2017   PROT 6.3 (L) 12/22/2017   PROT 7.3 02/21/2015   ALBUMIN 2.1 (L) 12/22/2017   ALBUMIN 4.1 02/21/2015   BILITOT 0.6 12/22/2017   BILITOT 0.2 02/21/2015   ALKPHOS 193 (H) 12/22/2017   AST 42 (H) 12/22/2017   ALT 57 (H) 12/22/2017  .   Total Time in preparing paper work, data evaluation and todays exam - 67 minutes  Jani Gravel M.D on 12/22/2017 at 11:15 AM  [facility]   Office  [phone]

## 2017-12-22 NOTE — Plan of Care (Signed)
Spoke with pt about current condition. BP have been hypertensive, systolic over 148. Pt given hydralazine and educated

## 2017-12-22 NOTE — Progress Notes (Addendum)
[facility] Progress Note    Subjective: Patient doing very well.  No acute distress.  Passing gas and having bowelmovements.  Wants to go home.  Objective: Vital signs in last 24 hours: Temp:  [98 F (36.7 C)-99.3 F (37.4 C)] 98.2 F (36.8 C) (06/30 0733) Pulse Rate:  [91-106] 95 (06/30 0733) Resp:  [18-20] 20 (06/30 0733) BP: (136-195)/(74-106) 136/74 (06/30 0733) SpO2:  [97 %-100 %] 100 % (06/30 0733) Weight:  [81.5 kg (179 lb 10.8 oz)] 81.5 kg (179 lb 10.8 oz) (06/30 0500) Last BM Date: 12/21/17  Intake/Output from previous day: 06/29 0701 - 06/30 0700 In: 2495.3 [I.V.:2260.9; IV Piggyback:234.4] Out: 10 [Drains:10] Intake/Output this shift: No intake/output data recorded.  General: No acute distress  Lungs: Clear  Abd: Soft, good bowel sounds.  Drain still putting out purulent fluid, but not a large volume.  Will need follow up with IR clinic and Surgery as outpatient.  Extremities: No changes  Neuro: Intact  Lab Results:  @LABLAST2 (wbc:2,hgb:2,hct:2,plt:2) BMET ) Recent Labs    12/21/17 0232 12/22/17 0118  NA 140 142  K 3.5 3.1*  CL 114* 113*  CO2 20* 19*  GLUCOSE 97 130*  BUN <5* <5*  CREATININE 1.03* 1.05*  CALCIUM 7.2* 7.4*   PT/INR No results for input(s): LABPROT, INR in the last 72 hours. ABG No results for input(s): PHART, HCO3 in the last 72 hours.  Invalid input(s): PCO2, PO2  Studies/Results: No results found.  Anti-infectives: Anti-infectives (From admission, onward)   Start     Dose/Rate Route Frequency Ordered Stop   12/18/17 1000  piperacillin-tazobactam (ZOSYN) IVPB 3.375 g     3.375 g 12.5 mL/hr over 240 Minutes Intravenous Every 8 hours 12/18/17 0919     12/18/17 0915  ertapenem (INVANZ) 1,000 mg in sodium chloride 0.9 % 100 mL IVPB  Status:  Discontinued     1 g 200 mL/hr over 30 Minutes Intravenous Every 24 hours 12/18/17 0911 12/18/17 0919      Assessment/Plan: s/p  Okay to go  home of oral antibiotics, total of ten  days from start.    Probably another 7 days including today.  Augmentin bid Follow up woith Dr. Georgette Dover for surgery in 4 weeks to consider interval appendectomy  LOS: 5 days   Kathryne Eriksson. Dahlia Bailiff, MD, FACS [phone] [facility] Surgery 12/22/2017

## 2017-12-23 ENCOUNTER — Telehealth: Payer: Self-pay

## 2017-12-23 ENCOUNTER — Other Ambulatory Visit: Payer: Self-pay | Admitting: *Deleted

## 2017-12-23 ENCOUNTER — Other Ambulatory Visit: Payer: Self-pay | Admitting: Surgery

## 2017-12-23 DIAGNOSIS — K3532 Acute appendicitis with perforation and localized peritonitis, without abscess: Secondary | ICD-10-CM

## 2017-12-23 LAB — CULTURE, BLOOD (ROUTINE X 2)
CULTURE: NO GROWTH
Culture: NO GROWTH
SPECIAL REQUESTS: ADEQUATE
Special Requests: ADEQUATE

## 2017-12-23 NOTE — Patient Outreach (Signed)
[facility]) Care Management  12/23/2017  Sarah Brock March 12, 1956 [id number]   Subjective: Telephone call to patient's home  / mobile number, no answer, left HIPAA compliant voicemail message, and requested call back.      Objective: Per KPN (Knowledge Performance Now, point of care tool) and chart review, patient hospitalized 08/19/17 -12/22/17 for acute kidney injury, perforated appendicular abscess, and Elevated alkaline phosphatase level.   Patient also has a history of diabetes, seizure disorder, asthma, Hyperlipidemia, and Acute kidney failure.       Assessment: Received [facility] Transition of care referral on 12/18/17.  Transition of care follow up pending patient contact.      Plan:RNCM will send unsuccessful outreach  letter, [facility] pamphlet, will call patient for 2nd telephone outreach attempt, transition of care follow up, and proceed with case closure, within 10 business days if no return call.     Sarah Brock H. Annia Friendly, BSN, [place] Management [facility] Telephonic CM Phone: [phone] Fax: [phone]

## 2017-12-23 NOTE — Telephone Encounter (Signed)
I have made the 1st attempt to contact the patient or family member in charge, in order to follow up from recently being discharged from the hospital. I left a message on voicemail but I will make another attempt at a different time.  

## 2017-12-23 NOTE — Telephone Encounter (Signed)
Transition Care Management Follow-up Telephone Call  How have you been since you were released from the hospital? Having some loose stools. Wound site hurts a little but has no more stomach cramps  Do you understand why you were in the hospital? yes  Do you have a copy of your discharge instructions Yes Do you understand the discharge instrcutions? yes  Where were you discharged to? Home  Do you have support at home? No    Items Reviewed:  Medications obtained Yes, on antibiotics. She is going to take tylenol instead of the Norco/Vicodin. She does not feel the need for ensure or zofran and will go pick up probiotics soon  Medications reviewed: Yes  Dietary changes reviewed: yes  Home Health? N/A  DME ordered at discharge obtained? NA,   Medical supplies: NA,    Functional Questionnaire:   Activities of Daily Living (ADLs):   She states they are independent in the following: ambulation, bathing and hygiene, feeding, grooming, toileting, dressing and medication management States they require assistance with the following: continence, wears pads for urine  Any transportation issues/concerns?: no  Any patient concerns? no  Confirmed importance and date/time of follow-up visits scheduled with PCP: yes, Sherrie Mustache NP 12/25/2017 @ 1pm  Confirm appointment scheduled with specialist? NA,   Confirmed with patient if condition begins to worsen call PCP or If it's emergency go to the ER.

## 2017-12-23 NOTE — Telephone Encounter (Signed)
Called the authorization center and spoke to [place], discuss about the Cardiac MR authorization and they have informed me that they have yet to review her case and will be contacting the patient if there will be any problems before her exam.

## 2017-12-24 ENCOUNTER — Other Ambulatory Visit: Payer: Self-pay | Admitting: *Deleted

## 2017-12-24 LAB — HEPATITIS PANEL, ACUTE
HCV Ab: <0.1 s/co ratio (ref 0.0–0.9)
HEP B C IGM: NEGATIVE
HEP B S AG: NEGATIVE
Hep A IgM: NEGATIVE

## 2017-12-24 NOTE — Patient Outreach (Signed)
[facility]) Care Management  12/24/2017  JENE HUQ 1956/01/25 [id number]   Subjective: Telephone call to patient's home / mobile number, spoke with patient, and HIPAA verified.  Discussed [facility] Care Management UMR Transition of care follow up, patient voiced understanding, and is in agreement to follow up.   Patient states she is doing okay, took medication recently, waiting for stomach to settle down, is aware that she can take nausea medication as needed, discussed strategies for decrease stomach upset while taking antibiotics, patient voices understanding, states she limits medication because she does not like to take a lot of medications.  States she has a follow up appointment with primary MD on 12/25/17 and with surgeon in interventional radiology on 12/25/17.  States she is planning to follow up on status of family medical leave act paperwork completion during tomorrow's appointment and will also verify if she has been assigned a Psychologist, sport and exercise.   Patient states she is able to manage self care and has assistance as needed with activities of daily living / home management.  Patient voices understanding of medical diagnosis and treatment plan.  States she is accessing the following [facility] benefits: outpatient pharmacy, hospital indemnity (not chosen), and will call [employer] today to follow up on status of family medical leave act (FMLA) claim.  Discussed diabetes disease management resources for [facility] employees and advised RNCM will send web sites in the successful outreach letter.   Patient states she does not have any education material, transition of care, care coordination, disease management, disease monitoring, transportation, community resource, or pharmacy needs at this time.  States she is very appreciative of the follow up and is in agreement to receive [place] Management information.    Objective: Per KPN (Knowledge Performance Now, point of care tool) and chart review, patient  hospitalized 08/19/17 -12/22/17 for acute kidney injury, perforated appendicular abscess, and Elevated alkaline phosphatase level.   Patient also has a history of diabetes, seizure disorder, asthma, Hyperlipidemia, and Acute kidney failure.       Assessment: Received UMR Transition of care referral on 12/18/17.  Transition of care follow up completed, no care management needs, and will proceed with case closure.     Plan:RNCM will send patient successful outreach letter, [facility] pamphlet, and magnet. RNCM will complete case closure due to follow up completed / no care management needs.      Houa Ackert H. Annia Friendly, BSN, [place] Management [facility] Telephonic CM Phone: [phone] Fax: [phone]

## 2017-12-25 ENCOUNTER — Ambulatory Visit (INDEPENDENT_AMBULATORY_CARE_PROVIDER_SITE_OTHER): Payer: 59 | Admitting: Nurse Practitioner

## 2017-12-25 ENCOUNTER — Ambulatory Visit
Admission: RE | Admit: 2017-12-25 | Discharge: 2017-12-25 | Disposition: A | Discharge status: Home / Self Care | Payer: 59 | Source: Ambulatory Visit | Attending: Radiology | Admitting: Radiology

## 2017-12-25 ENCOUNTER — Encounter: Payer: Self-pay | Admitting: Nurse Practitioner

## 2017-12-25 ENCOUNTER — Other Ambulatory Visit: Payer: Self-pay | Admitting: Surgery

## 2017-12-25 ENCOUNTER — Ambulatory Visit
Admission: RE | Admit: 2017-12-25 | Discharge: 2017-12-25 | Disposition: A | Discharge status: Home / Self Care | Payer: 59 | Source: Ambulatory Visit | Attending: Surgery | Admitting: Surgery

## 2017-12-25 VITALS — BP 168/92 | HR 95 | Temp 97.8°F | Ht 62.0 in | Wt 165.0 lb

## 2017-12-25 DIAGNOSIS — N179 Acute kidney failure, unspecified: Secondary | ICD-10-CM

## 2017-12-25 DIAGNOSIS — I10 Essential (primary) hypertension: Secondary | ICD-10-CM

## 2017-12-25 DIAGNOSIS — E119 Type 2 diabetes mellitus without complications: Secondary | ICD-10-CM

## 2017-12-25 DIAGNOSIS — K3532 Acute appendicitis with perforation and localized peritonitis, without abscess: Secondary | ICD-10-CM

## 2017-12-25 DIAGNOSIS — D509 Iron deficiency anemia, unspecified: Secondary | ICD-10-CM | POA: Diagnosis not present

## 2017-12-25 DIAGNOSIS — L02211 Cutaneous abscess of abdominal wall: Secondary | ICD-10-CM | POA: Diagnosis not present

## 2017-12-25 DIAGNOSIS — K353 Acute appendicitis with localized peritonitis, without perforation or gangrene: Secondary | ICD-10-CM | POA: Diagnosis not present

## 2017-12-25 DIAGNOSIS — K3533 Acute appendicitis with perforation and localized peritonitis, with abscess: Secondary | ICD-10-CM

## 2017-12-25 HISTORY — PX: IR RADIOLOGIST EVAL & MGMT: IMG5224

## 2017-12-25 LAB — BASIC METABOLIC PANEL WITH GFR
BUN/Creatinine Ratio: 5 (calc) — ABNORMAL LOW (ref 6–22)
BUN: 4 mg/dL — AB (ref 7–25)
CALCIUM: 9 mg/dL (ref 8.6–10.4)
CHLORIDE: 107 mmol/L (ref 98–110)
CO2: 21 mmol/L (ref 20–32)
Creat: 0.77 mg/dL (ref 0.50–0.99)
GFR, EST AFRICAN AMERICAN: 96 mL/min/{1.73_m2} (ref >=60)
GFR, EST NON AFRICAN AMERICAN: 83 mL/min/{1.73_m2} (ref >=60)
Glucose, Bld: 91 mg/dL (ref 65–139)
POTASSIUM: 4.1 mmol/L (ref 3.5–5.3)
Sodium: 140 mmol/L (ref 135–146)

## 2017-12-25 LAB — CBC WITH DIFFERENTIAL/PLATELET
Basophils Absolute: 34 cells/uL (ref 0–200)
Basophils Relative: 0.3 %
EOS PCT: 1.6 %
Eosinophils Absolute: 181 cells/uL (ref 15–500)
HEMATOCRIT: 32.1 % — AB (ref 35.0–45.0)
Hemoglobin: 10.3 g/dL — ABNORMAL LOW (ref 11.7–15.5)
LYMPHS ABS: 2249 {cells}/uL (ref 850–3900)
MCH: 25.5 pg — ABNORMAL LOW (ref 27.0–33.0)
MCHC: 32.1 g/dL (ref 32.0–36.0)
MCV: 79.5 fL — AB (ref 80.0–100.0)
MPV: 9.8 fL (ref 7.5–12.5)
Monocytes Relative: 6.4 %
NEUTROS ABS: 8113 {cells}/uL — AB (ref 1500–7800)
Neutrophils Relative %: 71.8 %
Platelets: 543 10*3/uL — ABNORMAL HIGH (ref 140–400)
RBC: 4.04 10*6/uL (ref 3.80–5.10)
RDW: 13.9 % (ref 11.0–15.0)
Total Lymphocyte: 19.9 %
WBC: 11.3 10*3/uL — AB (ref 3.8–10.8)
WBCMIX: 723 {cells}/uL (ref 200–950)

## 2017-12-25 MED ORDER — IOHEXOL 300 MG/ML  SOLN
100.0000 mL | Freq: Once | INTRAMUSCULAR | Status: AC | PRN
  Administered 2017-12-25: 100 mL via INTRAVENOUS

## 2017-12-25 NOTE — Patient Instructions (Addendum)
To make follow up with Dr Georgette Dover (surgery) for 4 week follow up  Donnie Mesa, MD. Schedule an appointment as soon as possible for a visit in 4 week(s).   Specialty:  General Surgery Why:  for follow up regarding recent appendicitis. Contact information: [address] [place] [address] [phone]   To start probiotic can use florastor by mouth twice daily   Follow up with Dr Eulas Post in 6 weeks for routine follow up.

## 2017-12-25 NOTE — Progress Notes (Signed)
Careteam: Patient Care Team: Gildardo Cranker, DO as PCP - General (Internal Medicine)  Advanced Directive information Does Patient Have a Medical Advance Directive?: No  Allergies  Allergen Reactions  . Simvastatin Other (See Comments)    Stomach pain, low back pain   . Shrimp [Shellfish Allergy] Other (See Comments)    Swelling and hypersensitivity    Chief Complaint  Patient presents with  . Transitions Of Care    Pt was recently admitted to [facility] 6/25 to 6/30.      HPI: Patient is a 62 y.o. female seen in the office today for hospital follow up. history of iron deficiency anemia, asthma, seizure disorder on Keppra, who went to the hospital with 2 to 3-week history of progressive worsening fatigue, and malaise, URI felt to be viral. She had been experiencing sharp low back pain and was taking increased amounts of NSAIDs and taking naproxen BID.   She was admitted for ARF due to NSAIDs and lisinipril use and CT scan abdomen found 4x6x5cm fluid collection likely abscess thought to be secondary to perforated appendix.  Surgery consulted and patient started on Zosyn. IR drain placed on 6/27 .  She was then transitioned to Augmentin for 7 days for home and IR and surgery follow up.  Cr had improved prior to discharge and she was instructed to stop naproxen. Lisinopril was resumed prior to discharge.  Still having tenderness to right lower quadrant but improving Eating better, moving better and energy is improving.  Continues to have back- still nagging at her. Taking tylenol as needed.   Usually blood pressure at home is better than what is currently.  States blood pressure at work- 140-150/?  Review of Systems:  Review of Systems  Constitutional: Positive for malaise/fatigue (improving). Negative for chills and fever.  HENT: Negative for sore throat and tinnitus.   Eyes: Negative for blurred vision and double vision.  Respiratory: Negative for cough, shortness of breath and  wheezing.   Cardiovascular: Negative for chest pain, palpitations, orthopnea and leg swelling.  Gastrointestinal: Positive for abdominal pain (improving). Negative for blood in stool, constipation, diarrhea, heartburn, nausea and vomiting.       Soft stools, becoming more normal  Genitourinary: Negative for dysuria, frequency, hematuria and urgency.  Musculoskeletal: Positive for back pain. Negative for falls, joint pain and myalgias.  Skin: Negative for rash.  Neurological: Negative for dizziness, sensory change, loss of consciousness, weakness and headaches.       Last seizure 4-5 years ago, none since  Endo/Heme/Allergies: Positive for environmental allergies. Does not bruise/bleed easily.  Psychiatric/Behavioral: Negative for depression. The patient is not nervous/anxious.     Past Medical History:  Diagnosis Date  . Anemia, unspecified   . Asthma    as a child  . Diabetes mellitus    Type II  . History of blood transfusion    pre hysterectomy  . Osteoarthrosis, unspecified whether generalized or localized, unspecified site   . Other and unspecified hyperlipidemia   . Other convulsions    last one 2013  . Other malaise and fatigue   . Other specified cardiac dysrhythmias(427.89) 2006   "history of Bradycardia" .  wore a mointor nothing found  . Seizure disorder ([place])   . Unspecified essential hypertension   . Unspecified vitamin D deficiency    Past Surgical History:  Procedure Laterality Date  . ABDOMINAL HYSTERECTOMY    . BREAST LUMPECTOMY Right 1997  . CHOLECYSTECTOMY N/A 02/28/2016   Procedure: LAPAROSCOPIC  CHOLECYSTECTOMY;  Surgeon: Mickeal Skinner, MD;  Location: [place];  Service: General;  Laterality: N/A;   Social History:   reports that she has never smoked. She has never used smokeless tobacco. She reports that she does not drink alcohol or use drugs.  Family History  Problem Relation Age of Onset  . Cancer Father        Esophageal     Medications: Patient's Medications  New Prescriptions   No medications on file  Previous Medications   AMOXICILLIN-CLAVULANATE (AUGMENTIN) 875-125 MG TABLET    Take 1 tablet by mouth 2 (two) times daily for 7 days.   CETIRIZINE (ZYRTEC) 10 MG TABLET    Take 10 mg by mouth at bedtime.   EZETIMIBE (ZETIA) 10 MG TABLET    Take 1 tablet (10 mg total) by mouth daily.   FERROUS SULFATE 325 (65 FE) MG TABLET    Take 1 tablet (325 mg total) by mouth 2 (two) times daily with a meal.   GLUCOSE BLOOD TEST STRIP    Use as instructed   LEVETIRACETAM (KEPPRA) 500 MG TABLET    TAKE ONE TABLET BY MOUTH EVERY 12 HOURS FOR SEIZURES   LISINOPRIL (PRINIVIL,ZESTRIL) 30 MG TABLET    TAKE 1 TABLET BY MOUTH ONCE DAILY FOR BLOOD PRESSURE   METFORMIN (GLUCOPHAGE) 1000 MG TABLET    TAKE ONE TABLET BY MOUTH TWICE DAILY WITH MEALS TO CONTROL BLOOD SUGAR   PROBIOTIC PRODUCT (VSL#3) CAPS    Take 2 capsules by mouth daily.  Modified Medications   No medications on file  Discontinued Medications   FEEDING SUPPLEMENT, ENSURE ENLIVE, (ENSURE ENLIVE) LIQD    Take 237 mLs by mouth 2 (two) times daily between meals.   HYDROCODONE-ACETAMINOPHEN (NORCO/VICODIN) 5-325 MG TABLET    Take 1-2 tablets by mouth every 4 (four) hours as needed for moderate pain.   ONDANSETRON (ZOFRAN) 4 MG TABLET    Take 1 tablet (4 mg total) by mouth every 6 (six) hours as needed for nausea.     Physical Exam:  Vitals:   12/25/17 1259  BP: (!) 168/92  Pulse: 95  Temp: 97.8 F (36.6 C)  TempSrc: Oral  SpO2: 97%  Weight: 165 lb (74.8 kg)  Height: 5' 2" (1.575 m)   Body mass index is 30.18 kg/m.  Physical Exam  Constitutional: She is oriented to person, place, and time. She appears well-developed and well-nourished.  HENT:  Mouth/Throat: Oropharynx is clear and moist. No oropharyngeal exudate.  MMM; no oral thrush  Eyes: Pupils are equal, round, and reactive to light. No scleral icterus.  Neck: Neck supple. Carotid bruit is not  present. No tracheal deviation present. No thyromegaly present.  Cardiovascular: Normal rate, regular rhythm, normal heart sounds and intact distal pulses. Exam reveals no gallop and no friction rub.  No murmur heard. No LE edema b/l. no calf TTP.   Pulmonary/Chest: Effort normal and breath sounds normal. No stridor. No respiratory distress. She has no rhonchi.  Abdominal: Soft. Normal appearance and bowel sounds are normal. She exhibits no distension and no mass. There is no hepatomegaly. There is tenderness (right lower quad around drain). There is no rigidity, no rebound and no guarding. No hernia.  Musculoskeletal: She exhibits no edema.  Lymphadenopathy:    She has no cervical adenopathy.  Neurological: She is alert and oriented to person, place, and time.  Skin: Skin is warm and dry. No rash noted.  Psychiatric: She has a normal mood and affect.  Labs reviewed: Basic Metabolic Panel: Recent Labs    09/24/17 1603 12/16/17 1600  12/20/17 0311 12/21/17 0232 12/22/17 0118  NA 141 137   < > 137 140 142  K 3.7 4.3   < > 3.2* 3.5 3.1*  CL 106 103   < > 110 114* 113*  CO2 27 21   < > 17* 20* 19*  GLUCOSE 99 173*   < > 160* 97 130*  BUN 12 42*   < > 5* <5* <5*  CREATININE 0.85 2.68*   < > 1.01* 1.03* 1.05*  CALCIUM 9.1 8.9   < > 7.3* 7.2* 7.4*  TSH 1.33 1.29  --   --   --   --    < > = values in this interval not displayed.   Liver Function Tests: Recent Labs    12/19/17 0306 12/20/17 0311 12/22/17 0118  AST 49* 36 42*  ALT 63* 49* 57*  ALKPHOS 224* 187* 193*  BILITOT 0.8 0.7 0.6  PROT 6.6 5.9* 6.3*  ALBUMIN 2.3* 2.1* 2.1*   Recent Labs    12/17/17 1352  LIPASE 25   No results for input(s): AMMONIA in the last 8760 hours. CBC: Recent Labs    09/24/17 1603 12/16/17 1600  12/20/17 0311 12/21/17 0232 12/22/17 0118  WBC 7.0 17.0*   < > 14.3* 15.7* 14.1*  NEUTROABS 3,675 13,175*  --   --   --   --   HGB 11.4* 10.7*   < > 9.5* 8.6* 9.8*  HCT 34.1* 33.1*   < >  29.6* 27.3* 31.1*  MCV 78.4* 78.3*   < > 78.9 80.5 79.9  PLT 294 313   < > 294 273 334   < > = values in this interval not displayed.   Lipid Panel: Recent Labs    01/03/17 1557 09/24/17 1603  CHOL 224* 181  HDL 66 57  LDLCALC 142* 97  TRIG 82 162*  CHOLHDL 3.4 3.2   TSH: Recent Labs    09/24/17 1603 12/16/17 1600  TSH 1.33 1.29   A1C: Lab Results  Component Value Date   HGBA1C 6.8 (H) 09/24/2017     Assessment/Plan 1. Perforated appendicitis Abdominal pain has improved, continues with drain, has follow up with IR today for follow up CT scan.  Continues on augmentin BID for 7 days.  Encouraged to use probiotics (florastor) twice daily -to make follow up with surgery  - CBC with Differential/Platelets - BMP with eGFR([facility])  2. Essential hypertension -elevated today. Reports blood pressure is generally better, will continue to monitor and make changes as needed  3. Iron deficiency anemia, unspecified iron deficiency anemia type  taking iron twice daily -follow up CBC  4. AKI (acute kidney injury) (HCC) -improving on discharge from hospital. -staying well hydrated -avoiding NSAID's, will follow up lab today - BMP with eGFR([facility])  5. Controlled type 2 diabetes mellitus without complication, without long-term current use of insulin (HCC) Stable, continues on metformin twice daily as prescribed, blood sugars have been well controlled.   Next appt: 6 weeks with Dr Carter  K. , AGNP  [facility] [phone]   

## 2017-12-25 NOTE — Progress Notes (Signed)
Patient ID: Sarah Brock, female   DOB: 18-Feb-1956, 62 y.o.   MRN: [id number]        Chief Complaint: Transcatheter evaluation and management   Referring Physician(s): Tsuei  History of Present Illness: Sarah Brock is a 62 y.o. female with past medical history significant for anemia, diabetes and seizure disorder who underwent toe successful placement of a right lower quadrant/pelvic percutaneous drainage catheter on 12/19/2017 for the purposes of infection source control in the setting of a presumed appendiceal abscess in the setting of an appendicolith.  She presents today to the interventional radiology drain clinic for drainage catheter evaluation and management.  She is unaccompanied and serves as her own historian.  Patient was discharged from the hospital this past weekend and has not been flushing the percutaneous drainage catheter however states there is been about 15 to 20 cc of purulent fluid from the percutaneous drainage catheter per day.  Patient continues to take her prescribed course of antibiotics.  She denies worsening abdominal pain.  Past Medical History:  Diagnosis Date  . Anemia, unspecified   . Asthma    as a child  . Diabetes mellitus    Type II  . History of blood transfusion    pre hysterectomy  . Osteoarthrosis, unspecified whether generalized or localized, unspecified site   . Other and unspecified hyperlipidemia   . Other convulsions    last one 2013  . Other malaise and fatigue   . Other specified cardiac dysrhythmias(427.89) 2006   "history of Bradycardia" .  wore a mointor nothing found  . Seizure disorder ([place])   . Unspecified essential hypertension   . Unspecified vitamin D deficiency     Past Surgical History:  Procedure Laterality Date  . ABDOMINAL HYSTERECTOMY    . BREAST LUMPECTOMY Right 1997  . CHOLECYSTECTOMY N/A 02/28/2016   Procedure: LAPAROSCOPIC CHOLECYSTECTOMY;  Surgeon: Arta Bruce Kinsinger, MD;  Location: [facility];  Service:  General;  Laterality: N/A;    Allergies: Simvastatin and Shrimp [shellfish allergy]  Medications: Prior to Admission medications   Medication Sig Start Date End Date Taking? Authorizing Provider  amoxicillin-clavulanate (AUGMENTIN) 875-125 MG tablet Take 1 tablet by mouth 2 (two) times daily for 7 days. 12/22/17 12/29/17  Jani Gravel, MD  cetirizine (ZYRTEC) 10 MG tablet Take 10 mg by mouth at bedtime.    [provider]  ezetimibe (ZETIA) 10 MG tablet Take 1 tablet (10 mg total) by mouth daily. 01/07/17   Lauree Chandler, NP  ferrous sulfate 325 (65 FE) MG tablet Take 1 tablet (325 mg total) by mouth 2 (two) times daily with a meal. 02/26/14   Blanchie Serve, MD  glucose blood test strip Use as instructed 02/24/14   Blanchie Serve, MD  levETIRAcetam (KEPPRA) 500 MG tablet TAKE ONE TABLET BY MOUTH EVERY 12 HOURS FOR SEIZURES 12/12/17   Lauree Chandler, NP  lisinopril (PRINIVIL,ZESTRIL) 30 MG tablet TAKE 1 TABLET BY MOUTH ONCE DAILY FOR BLOOD PRESSURE 11/22/17   Gildardo Cranker, DO  metFORMIN (GLUCOPHAGE) 1000 MG tablet TAKE ONE TABLET BY MOUTH TWICE DAILY WITH MEALS TO CONTROL BLOOD SUGAR 11/22/17   Gildardo Cranker, DO  Probiotic Product (VSL#3) CAPS Take 2 capsules by mouth daily. Patient not taking: Reported on 12/24/2017 12/22/17   Jani Gravel, MD     Family History  Problem Relation Age of Onset  . Cancer Father        Esophageal    Social History   Socioeconomic History  .  Marital status: Married    Spouse name: Not on file  . Number of children: Not on file  . Years of education: Not on file  . Highest education level: Not on file  Occupational History  . Not on file  Social Needs  . Financial resource strain: Not on file  . Food insecurity:    Worry: Not on file    Inability: Not on file  . Transportation needs:    Medical: Not on file    Non-medical: Not on file  Tobacco Use  . Smoking status: Never Smoker  . Smokeless tobacco: Never Used  Substance and Sexual  Activity  . Alcohol use: No    Alcohol/week: 0.0 oz    Comment: rarely- none on years  . Drug use: No  . Sexual activity: Not Currently  Lifestyle  . Physical activity:    Days per week: Not on file    Minutes per session: Not on file  . Stress: Not on file  Relationships  . Social connections:    Talks on phone: Not on file    Gets together: Not on file    Attends religious service: Not on file    Active member of club or organization: Not on file    Attends meetings of clubs or organizations: Not on file    Relationship status: Not on file  Other Topics Concern  . Not on file  Social History Narrative  . Not on file    ECOG Status: 1 - Symptomatic but completely ambulatory  Review of Systems: A 12 point ROS discussed and pertinent positives are indicated in the HPI above.  All other systems are negative.  Review of Systems  Constitutional: Negative for fever.  Gastrointestinal: Negative for abdominal pain.    Vital Signs: There were no vitals taken for this visit.  Physical Exam  Nursing note and vitals reviewed.   Imaging: Ct Abdomen Pelvis Wo Contrast  Result Date: 12/17/2017 CLINICAL DATA:  62 year old female with acute abdominal pain, acute renal failure and elevated LFTs. EXAM: CT ABDOMEN AND PELVIS WITHOUT CONTRAST TECHNIQUE: Multidetector CT imaging of the abdomen and pelvis was performed following the standard protocol without IV contrast. COMPARISON:  05/23/2010 CT FINDINGS: Please note that parenchymal abnormalities may be missed without intravenous contrast. Lower chest: No acute abnormality.  Mild cardiomegaly identified. Hepatobiliary: No hepatic abnormalities identified. The patient is status post cholecystectomy. No biliary dilatation. Pancreas: Unremarkable Spleen: Unremarkable Adrenals/Urinary Tract: The kidneys, adrenal glands and bladder are unremarkable. Stomach/Bowel: A 4 x 6 x 5 cm thick-walled fluid collection containing gas is identified within  the LOWER RIGHT pelvis likely representing abscess. The origin of this probable abscess is difficult to determine but there is a mildly enlarged adjacent appendix noted. A RIGHT tubo-ovarian abscess or small bowel/colonic origin are considerations as these structures lie in this area. There is a 1 cm calcification within this fluid/gas collection could represent an appendicolith. There is no evidence of bowel obstruction or other areas of bowel wall thickening. Colonic diverticulosis is identified. Vascular/Lymphatic: No significant vascular findings are present. No enlarged abdominal or pelvic lymph nodes. Reproductive: Status post hysterectomy.  No LEFT adnexal masses. Other: A trace amount of free pelvic fluid is noted. No pneumoperitoneum. Musculoskeletal: No acute or suspicious bony abnormalities. Degenerative changes in the lumbar spine are identified. IMPRESSION: 1. 4 x 6 x 5 cm probable abscess within the LOWER RIGHT pelvis, of uncertain origin. Considerations include ruptured appendicitis as there is a mildly enlarged  adjacent appendix noted. RIGHT tubo-ovarian abscess or abscess from adjacent small bowel/colon are also considerations. 2. No pneumoperitoneum or bowel obstruction. 3. Mild cardiomegaly. Electronically Signed   By: Margarette Canada M.D.   On: 12/17/2017 17:43   Dg Chest 2 View  Result Date: 12/17/2017 CLINICAL DATA:  Cough.  Chest discomfort. EXAM: CHEST - 2 VIEW COMPARISON:  12/18/2015 FINDINGS: There is new mild cardiomegaly. Pulmonary vascularity is normal and the lungs are clear. No effusions. Bones appear normal. IMPRESSION: New mild cardiomegaly. Electronically Signed   By: Lorriane Shire M.D.   On: 12/17/2017 11:24   Ct Abdomen Pelvis W Contrast  Result Date: 12/25/2017 CLINICAL DATA:  Right lower quadrant abdominal abscess worrisome for appendicitis in the setting of a suspected appendicolith, post percutaneous drainage catheter placement on 12/19/2017. Patient presents today to the  interventional radiology drain Clinic for drainage catheter evaluation and management. The patient has not been flushing the percutaneous drainage catheter since being discharged from the hospital this past weekend. Patient reports approximately 15-20 cc of purulent output from the drainage catheter per day. Patient continues on her prescribed course of antibiotics. Patient denies worsening abdominal pain, fever or chills. EXAM: CT ABDOMEN AND PELVIS WITH CONTRAST TECHNIQUE: Multidetector CT imaging of the abdomen and pelvis was performed using the standard protocol following bolus administration of intravenous contrast. CONTRAST:  143mL OMNIPAQUE IOHEXOL 300 MG/ML  SOLN COMPARISON:  CT abdomen pelvis - 12/17/2017; CT-guided right lower quadrant percutaneous drainage catheter placement-12/19/2017 FINDINGS: Lower chest: Limited visualization of the lower thorax demonstrates minimal subsegmental atelectasis within the imaged left lower lobe. No pleural effusion. Cardiomegaly.  No pericardial effusion. Hepatobiliary: There is mild nodularity hepatic contour. Minimal amount of focal fatty infiltration adjacent to the fissure for the ligamentum teres. No discrete hepatic lesions. Post cholecystectomy. No intra extrahepatic bili duct dilatation. No ascites. Pancreas: Normal appearance of the pancreas Spleen: Normal appearance of the spleen Adrenals/Urinary Tract: There is symmetric enhancement of the bilateral kidneys. No definite renal stones on this postcontrast examination. No discrete renal lesions. No urinary obstruction or perinephric stranding. Normal appearance the bilateral adrenal glands. Normal appearance of the urinary bladder given underdistention. Stomach/Bowel: Slight retraction of right lower quadrant percutaneous drainage catheter however the end remains coiled within the peripheral aspect of the residual peripherally enhancing fluid collection which now measures approximately 5.2 x 4.7 x 3.8 cm as  measured in greatest oblique short axis coronal (72, series 6) and axial (image 66, series 2) dimensions respectively, previously, 6.0 x 5.0 x 4.0 cm. Note is again made of a punctate (approximately 1 cm) potential appendicolith within the dependent portion of the residual fluid collection. This fluid collection is again noted to be intimately associated with the tip of the appendix. No new definable/drainable fluid collections within the abdomen or pelvis. Moderate colonic stool burden without evidence of enteric obstruction. Scattered colonic diverticulosis without evidence of diverticulitis. Normal appearance of the terminal ileum. Vascular/Lymphatic: Mildly tortuous but normal caliber abdominal aorta. The major branch vessels of the abdominal aorta appear patent on this non CTA examination. No bulky retroperitoneal, mesenteric, pelvic or inguinal lymphadenopathy. Reproductive: Post hysterectomy.  No discrete adnexal lesion. Other: Regional soft tissues appear normal. 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 Grade 1 anterolisthesis of L5 upon S1 without associated pars defects. IMPRESSION: Slight retraction of right lower quadrant percutaneous  drainage catheter with end remaining coiled and locked within the grossly unchanged to slightly reduced right lower quadrant presumed appendiceal abscess, currently measuring 5.2 cm in diameter, previously, 6 cm. No new definable/drainable fluid collections. PLAN: - Given persistent output from the percutaneous drainage catheter as well as residual size of the appendiceal abscess, the decision was made NOT to proceed with drainage catheter injection at this time. - The patient will return to the interventional radiology drain clinic in 2 weeks for drainage  catheter evaluation and management with repeat CT scan of the abdomen and pelvis and potential percutaneous drainage catheter injection. Electronically Signed   By: Sandi Mariscal M.D.   On: 12/25/2017 15:18   Ct Image Guided Drainage By Percutaneous Catheter  Result Date: 12/19/2017 INDICATION: Right lower quadrant abdominal abscess worrisome for ruptured appendicitis in the setting of suspected appendicolith. Please perform CT-guided drainage catheter placement for infection source control purposes. EXAM: CT IMAGE GUIDED DRAINAGE BY PERCUTANEOUS CATHETER COMPARISON:  CT abdomen and pelvis - 12/17/2017 MEDICATIONS: The patient is currently admitted to the hospital and receiving intravenous antibiotics. The antibiotics were administered within an appropriate time frame prior to the initiation of the procedure. ANESTHESIA/SEDATION: Moderate (conscious) sedation was employed during this procedure. A total of Versed 1.5 mg and Fentanyl 75 mcg was administered intravenously. Moderate Sedation Time: 17 minutes. The patient's level of consciousness and vital signs were monitored continuously by radiology nursing throughout the procedure under my direct supervision. CONTRAST:  None COMPLICATIONS: None immediate. PROCEDURE: Informed written consent was obtained from the patient after a discussion of the risks, benefits and alternatives to treatment. The patient was placed supine on the CT gantry and a pre procedural CT was performed re-demonstrating the known abscess/fluid collection within the right lower abdominal quadrant with dominant component measuring at least 6.8 x 4.5 cm (image 29, series 2). The procedure was planned. A timeout was performed prior to the initiation of the procedure. The skin overlying the ventral aspect of the right lower abdomen/pelvis was prepped and draped in the usual sterile fashion. The overlying soft tissues were anesthetized with 1% lidocaine with epinephrine. Appropriate trajectory was  planned with the use of a 22 gauge spinal needle. An 18 gauge trocar needle was advanced into the abscess/fluid collection and a short Amplatz super stiff wire was coiled within the collection. Appropriate positioning was confirmed with a limited CT scan. The tract was serially dilated allowing placement of a 10 [place] all-purpose drainage catheter. Appropriate positioning was confirmed with a limited postprocedural CT scan. Approximately 60 ml of purulent fluid was aspirated. The tube was connected to a drainage bag and sutured in place. A dressing was placed. The patient tolerated the procedure well without immediate post procedural complication. IMPRESSION: Successful CT guided placement of a 10 French all purpose drain catheter into the right lower abdominal/pelvic abscess with aspiration of 60 mL of purulent fluid. Samples were sent to the laboratory as requested by the ordering clinical team. Electronically Signed   By: Sandi Mariscal M.D.   On: 12/19/2017 15:45    Labs:  CBC: Recent Labs    12/19/17 0306 12/20/17 0311 12/21/17 0232 12/22/17 0118  WBC 18.0* 14.3* 15.7* 14.1*  HGB 10.5* 9.5* 8.6* 9.8*  HCT 32.9* 29.6* 27.3* 31.1*  PLT 307 294 273 334    COAGS: Recent Labs    12/17/17 1352  INR 1.02    BMP: Recent Labs    12/19/17 0306 12/20/17 0311 12/21/17 0232 12/22/17 0118  NA 139  137 140 142  K 3.7 3.2* 3.5 3.1*  CL 109 110 114* 113*  CO2 19* 17* 20* 19*  GLUCOSE 105* 160* 97 130*  BUN 11 5* <5* <5*  CALCIUM 8.1* 7.3* 7.2* 7.4*  CREATININE 1.15* 1.01* 1.03* 1.05*  GFRNONAA 50* 58* 57* 56*  GFRAA 58* >60 >60 >60    LIVER FUNCTION TESTS: Recent Labs    12/17/17 1136 12/19/17 0306 12/20/17 0311 12/22/17 0118  BILITOT 0.6 0.8 0.7 0.6  AST 49* 49* 36 42*  ALT 61* 63* 49* 57*  ALKPHOS 190* 224* 187* 193*  PROT 7.3 6.6 5.9* 6.3*  ALBUMIN 2.8* 2.3* 2.1* 2.1*    TUMOR MARKERS: No results for input(s): AFPTM, CEA, CA199, CHROMGRNA in the last 8760  hours.  Assessment and Plan:  Sarah Brock is a 62 y.o. female with past medical history significant for anemia, diabetes and seizure disorder who underwent successful placement of a right lower quadrant/pelvic percutaneous drainage catheter on 12/19/2017 for the purposes of infection source control in the setting of a presumed appendiceal abscess in the setting of an appendicolith.    Patient was discharged from the hospital this past weekend and has not been flushing the percutaneous drainage catheter however states there is been about 15 to 20 cc of purulent fluid from the percutaneous drainage catheter per day.  Patient continues to take her prescribed course of antibiotics.    Given persistent output from the percutaneous catheter as well as residual size of the appendiceal abscess (currently measuring 6 cm in diameter, previously, 5.2 cm), the decision was made not to proceed with drainage catheter injection at this time.  The patient will return to the interventional radiology drain clinic in 2 weeks for drainage catheter evaluation and management with repeat CT scan of the abdomen and pelvis potential percutaneous drainage catheter injection.  In the meantime, given the persistent output from the drainage catheter, the patient was instructed not to flush the percutaneous drainage catheter but to maintain diligent records regarding the drainage catheter output.    The patient was encouraged to maintain compliance with her prescribed course of antibiotics and to keep any subsequent follow-up appointments with Dr. Georgette Dover as she may ultimately require definitive appendectomy prior to drainage catheter removal.  Patient knows to call the interventional radiology drain clinic with any interval questions or concerns.  A copy of this report was sent to the requesting provider on this date.  Electronically Signed: Sandi Mariscal 12/25/2017, 3:36 PM   I spent a total of 15 Minutes in face to face  in clinical consultation, greater than 50% of which was counseling/coordinating care for drainage catheter evaluation and management

## 2017-12-30 ENCOUNTER — Telehealth: Payer: Self-pay

## 2017-12-30 NOTE — Telephone Encounter (Signed)
Is the paperwork regarding her recent hospital admission? If so, she needs to get the papers completed by her surgeon as they will be able to determine her return to work date.

## 2017-12-30 NOTE — Telephone Encounter (Signed)
Patient called to check the status of [employer] Form-/FMLA.   Paperwork is in Dr.Carter's review and sign folder. Patient will be contacted once paperwork complete

## 2017-12-31 ENCOUNTER — Ambulatory Visit: Admit: 2017-12-31 | Discharge: 2018-01-01 | Payer: PRIVATE HEALTH INSURANCE | Attending: Diagnostic Radiology

## 2017-12-31 DIAGNOSIS — I472 Ventricular tachycardia: Secondary | ICD-10-CM

## 2017-12-31 MED ORDER — GADOBUTROL 10 MMOL/10 ML (1 MMOL/ML) INTRAVENOUS SOLUTION
10 | Freq: Once | INTRAVENOUS | Status: AC
Start: 2017-12-31 — End: 2017-12-31
  Administered 2017-12-31: 21:00:00 via INTRAVENOUS

## 2017-12-31 NOTE — Nursing Note (Signed)
Patient for MRI of the chest, awake, conscious, afebrile, not in distress, no complaints, representative [employer] lauren at bedside for pacemaker interrogation for MRI safe mode of DOO at rate 75bpm, attached to invivo monitor for heart rate, oxygenation and bloodpressure, squeeze ball on hand and will continue to monitor and refer for any unusualities.

## 2017-12-31 NOTE — Nursing Note (Signed)
MRI done, patient stable, no complaints, rep lauren at bedside and will interrogate patient

## 2018-01-01 NOTE — Telephone Encounter (Signed)
Spoke with patient regarding her paperwork, informed her that she would need to have these filled out by her surgeon. She stated that she would pick them up on 01/02/18.

## 2018-01-02 ENCOUNTER — Other Ambulatory Visit (HOSPITAL_COMMUNITY): Payer: Self-pay | Admitting: Student

## 2018-01-02 ENCOUNTER — Encounter (HOSPITAL_COMMUNITY): Payer: Self-pay | Admitting: Interventional Radiology

## 2018-01-02 ENCOUNTER — Ambulatory Visit (HOSPITAL_COMMUNITY)
Admission: RE | Admit: 2018-01-02 | Discharge: 2018-01-02 | Disposition: A | Discharge status: Home / Self Care | Payer: 59 | Source: Ambulatory Visit | Attending: Student | Admitting: Student

## 2018-01-02 DIAGNOSIS — E119 Type 2 diabetes mellitus without complications: Secondary | ICD-10-CM | POA: Diagnosis not present

## 2018-01-02 DIAGNOSIS — G40909 Epilepsy, unspecified, not intractable, without status epilepticus: Secondary | ICD-10-CM | POA: Insufficient documentation

## 2018-01-02 DIAGNOSIS — N179 Acute kidney failure, unspecified: Secondary | ICD-10-CM | POA: Insufficient documentation

## 2018-01-02 DIAGNOSIS — Z7984 Long term (current) use of oral hypoglycemic drugs: Secondary | ICD-10-CM | POA: Diagnosis not present

## 2018-01-02 DIAGNOSIS — M1990 Unspecified osteoarthritis, unspecified site: Secondary | ICD-10-CM | POA: Insufficient documentation

## 2018-01-02 DIAGNOSIS — E559 Vitamin D deficiency, unspecified: Secondary | ICD-10-CM | POA: Insufficient documentation

## 2018-01-02 DIAGNOSIS — E509 Vitamin A deficiency, unspecified: Secondary | ICD-10-CM | POA: Insufficient documentation

## 2018-01-02 DIAGNOSIS — E7849 Other hyperlipidemia: Secondary | ICD-10-CM | POA: Insufficient documentation

## 2018-01-02 DIAGNOSIS — K3532 Acute appendicitis with perforation and localized peritonitis, without abscess: Secondary | ICD-10-CM | POA: Diagnosis not present

## 2018-01-02 DIAGNOSIS — I10 Essential (primary) hypertension: Secondary | ICD-10-CM | POA: Insufficient documentation

## 2018-01-02 DIAGNOSIS — Z888 Allergy status to other drugs, medicaments and biological substances status: Secondary | ICD-10-CM | POA: Diagnosis not present

## 2018-01-02 DIAGNOSIS — Z91013 Allergy to seafood: Secondary | ICD-10-CM | POA: Insufficient documentation

## 2018-01-02 DIAGNOSIS — K381 Appendicular concretions: Secondary | ICD-10-CM | POA: Insufficient documentation

## 2018-01-02 DIAGNOSIS — Z4682 Encounter for fitting and adjustment of non-vascular catheter: Secondary | ICD-10-CM | POA: Insufficient documentation

## 2018-01-02 DIAGNOSIS — Z79899 Other long term (current) drug therapy: Secondary | ICD-10-CM | POA: Diagnosis not present

## 2018-01-02 DIAGNOSIS — Z9889 Other specified postprocedural states: Secondary | ICD-10-CM | POA: Diagnosis not present

## 2018-01-02 DIAGNOSIS — K3533 Acute appendicitis with perforation and localized peritonitis, with abscess: Secondary | ICD-10-CM

## 2018-01-02 DIAGNOSIS — D509 Iron deficiency anemia, unspecified: Secondary | ICD-10-CM | POA: Insufficient documentation

## 2018-01-02 DIAGNOSIS — T85638A Leakage of other specified internal prosthetic devices, implants and grafts, initial encounter: Secondary | ICD-10-CM | POA: Diagnosis not present

## 2018-01-02 HISTORY — PX: IR CATHETER TUBE CHANGE: IMG717

## 2018-01-02 MED ORDER — IOPAMIDOL (ISOVUE-300) INJECTION 61%
INTRAVENOUS | Status: AC
  Administered 2018-01-02: 10 mL
  Filled 2018-01-02: qty 50

## 2018-01-02 MED ORDER — LIDOCAINE HCL 1 % IJ SOLN
INTRAMUSCULAR | Status: AC
  Filled 2018-01-02: qty 20

## 2018-01-02 MED ORDER — LIDOCAINE HCL (PF) 1 % IJ SOLN
INTRAMUSCULAR | Status: DC | PRN
  Administered 2018-01-02: 5 mL

## 2018-01-02 MED FILL — HYDROCODON-APAP 5-325: 5-325 | 3 days supply | Qty: 30 | Fill #0

## 2018-01-02 MED FILL — AMOX-CLAV 875-125 MG TABLET: 875-125 | 7 days supply | Qty: 14 | Fill #0

## 2018-01-02 MED FILL — NORMAL SALINE FLUSH SYRINGE: 0.9 | 30 days supply | Qty: 60 | Fill #0

## 2018-01-02 NOTE — Procedures (Signed)
Pre procedural Dx: Abdominal abscess Post procedural Dx: Same  Technically successful fluoro guided replacement, repositioning and upsizing of now 12 Fr drainage catheter placement into the peri-appendiceal abscess yielding 15 cc of purulent foul smelling fluid..    Drain connected to JP bulb.  EBL: Minimal  Complications: None immediate  Ronny Bacon, MD Pager #: [phone]

## 2018-01-02 NOTE — Progress Notes (Signed)
PA met with patient post-procedure.  Reviewed flushing instructions and demonstrated flush.  Provided with prescription for flushes as well as a script for Augmentin 875/125 PO BID x7 days.  Patient is aware she should keep her appointment next Thursday with IR drain clinic.   Brynda Greathouse, MS RD PA-C 3:25 PM

## 2018-01-02 NOTE — Procedures (Signed)
Pre procedural Dx: Appendiceal abscess Post procedural Dx: Same  Technically successful fluoro guided exchange and repositioning and up-sizing of new 12 Fr right lower quadrant drainage cathter.  EBL: Minimal Complications: None immediate  Ronny Bacon, MD Pager #: [phone]

## 2018-01-03 MED FILL — ONDANSETRON HCL 4 MG TABLET: 4 | 5 days supply | Qty: 20 | Fill #0

## 2018-01-08 ENCOUNTER — Ambulatory Visit
Admission: RE | Admit: 2018-01-08 | Discharge: 2018-01-08 | Disposition: A | Discharge status: Home / Self Care | Payer: 59 | Source: Ambulatory Visit | Attending: Surgery | Admitting: Surgery

## 2018-01-08 ENCOUNTER — Encounter: Payer: Self-pay | Admitting: Radiology

## 2018-01-08 DIAGNOSIS — K3533 Acute appendicitis with perforation and localized peritonitis, with abscess: Secondary | ICD-10-CM

## 2018-01-08 DIAGNOSIS — K651 Peritoneal abscess: Secondary | ICD-10-CM | POA: Diagnosis not present

## 2018-01-08 DIAGNOSIS — K3580 Unspecified acute appendicitis: Secondary | ICD-10-CM | POA: Diagnosis not present

## 2018-01-08 HISTORY — PX: IR RADIOLOGIST EVAL & MGMT: IMG5224

## 2018-01-08 MED ORDER — IOPAMIDOL (ISOVUE-300) INJECTION 61%
100.0000 mL | Freq: Once | INTRAVENOUS | Status: AC | PRN
  Administered 2018-01-08: 100 mL via INTRAVENOUS

## 2018-01-08 NOTE — Progress Notes (Signed)
Chief Complaint: Status post percutaneous catheter drainage of right lower quadrant pelvic abscess.  Referring Physician(s): Tsuei,Matthew  History of Present Illness: Sarah Brock is a 62 y.o. female status post CT-guided catheter drainage of right pelvic abscess on 12/19/2017 of what appeared to represent ruptured appendicitis with abscess by CT.  A follow-up CT on 12/25/2017 demonstrated persistent abscess and the drainage catheter was exchanged and upsized on 01/02/2018.  Contrast injection at the time of that catheter exchange under fluoroscopy demonstrated a tubular structure potentially representing part of the appendix.  Since that time there has been persistent drainage of approximately 15-20 mL/day of a light greenish colored fluid.  Sarah Brock has not had a fever or significant abdominal pain.  She denies nausea, vomiting or diarrhea.  She just completed oral antibiotic therapy today.  She has a follow-up appointment with Dr. Georgette Dover on Friday of this week.  Past Medical History:  Diagnosis Date  . Anemia, unspecified   . Asthma    as a child  . Diabetes mellitus    Type II  . History of blood transfusion    pre hysterectomy  . Osteoarthrosis, unspecified whether generalized or localized, unspecified site   . Other and unspecified hyperlipidemia   . Other convulsions    last one 2013  . Other malaise and fatigue   . Other specified cardiac dysrhythmias(427.89) 2006   "history of Bradycardia" .  wore a mointor nothing found  . Seizure disorder ([place])   . Unspecified essential hypertension   . Unspecified vitamin D deficiency     Past Surgical History:  Procedure Laterality Date  . ABDOMINAL HYSTERECTOMY    . BREAST LUMPECTOMY Right 1997  . CHOLECYSTECTOMY N/A 02/28/2016   Procedure: LAPAROSCOPIC CHOLECYSTECTOMY;  Surgeon: Mickeal Skinner, MD;  Location: [place];  Service: General;  Laterality: N/A;  . IR CATHETER TUBE CHANGE  01/02/2018  . IR RADIOLOGIST EVAL &  MGMT  12/25/2017  . IR RADIOLOGIST EVAL & MGMT  01/08/2018    Allergies: Simvastatin and Shrimp [shellfish allergy]  Medications: Prior to Admission medications   Medication Sig Start Date End Date Taking? Authorizing Provider  cetirizine (ZYRTEC) 10 MG tablet Take 10 mg by mouth at bedtime.   Yes [provider]  ezetimibe (ZETIA) 10 MG tablet Take 1 tablet (10 mg total) by mouth daily. 01/07/17  Yes Lauree Chandler, NP  ferrous sulfate 325 (65 FE) MG tablet Take 1 tablet (325 mg total) by mouth 2 (two) times daily with a meal. 02/26/14  Yes Bubba Camp, Mahima, MD  glucose blood test strip Use as instructed 02/24/14  Yes Bubba Camp, Mahima, MD  levETIRAcetam (KEPPRA) 500 MG tablet TAKE ONE TABLET BY MOUTH EVERY 12 HOURS FOR SEIZURES 12/12/17  Yes Lauree Chandler, NP  lisinopril (PRINIVIL,ZESTRIL) 30 MG tablet TAKE 1 TABLET BY MOUTH ONCE DAILY FOR BLOOD PRESSURE 11/22/17  Yes Eulas Post, Monica, DO  metFORMIN (GLUCOPHAGE) 1000 MG tablet TAKE ONE TABLET BY MOUTH TWICE DAILY WITH MEALS TO CONTROL BLOOD SUGAR 11/22/17  Yes Gildardo Cranker, DO  Probiotic Product (VSL#3) CAPS Take 2 capsules by mouth daily. 12/22/17  Yes Jani Gravel, MD     Family History  Problem Relation Age of Onset  . Cancer Father        Esophageal    Social History   Socioeconomic History  . Marital status: Married    Spouse name: Not on file  . Number of children: Not on file  . Years of education:  Not on file  . Highest education level: Not on file  Occupational History  . Not on file  Social Needs  . Financial resource strain: Not on file  . Food insecurity:    Worry: Not on file    Inability: Not on file  . Transportation needs:    Medical: Not on file    Non-medical: Not on file  Tobacco Use  . Smoking status: Never Smoker  . Smokeless tobacco: Never Used  Substance and Sexual Activity  . Alcohol use: No    Alcohol/week: 0.0 oz    Comment: rarely- none on years  . Drug use: No  . Sexual activity: Not  Currently  Lifestyle  . Physical activity:    Days per week: Not on file    Minutes per session: Not on file  . Stress: Not on file  Relationships  . Social connections:    Talks on phone: Not on file    Gets together: Not on file    Attends religious service: Not on file    Active member of club or organization: Not on file    Attends meetings of clubs or organizations: Not on file    Relationship status: Not on file  Other Topics Concern  . Not on file  Social History Narrative  . Not on file     Review of Systems: A 12 point ROS discussed and pertinent positives are indicated in the HPI above.  All other systems are negative.  Review of Systems  Constitutional: Negative.   Respiratory: Negative.   Cardiovascular: Negative.   Gastrointestinal: Negative.   Genitourinary: Negative.   Musculoskeletal: Negative.   Neurological: Negative.     Vital Signs: BP (!) 172/80   Pulse 87   Temp 98.5 F (36.9 C) (Oral)   Resp 15   Ht 5\' 2"  (1.575 m)   Wt 165 lb (74.8 kg)   SpO2 99%   BMI 30.18 kg/m   Physical Exam  Constitutional: She is oriented to person, place, and time. No distress.  Abdominal: Soft. There is no tenderness. There is no rebound and no guarding.  Neurological: She is alert and oriented to person, place, and time.  Skin: She is not diaphoretic.  Vitals reviewed.   Imaging: Ct Abdomen Pelvis Wo Contrast  Result Date: 12/17/2017 CLINICAL DATA:  62 year old female with acute abdominal pain, acute renal failure and elevated LFTs. EXAM: CT ABDOMEN AND PELVIS WITHOUT CONTRAST TECHNIQUE: Multidetector CT imaging of the abdomen and pelvis was performed following the standard protocol without IV contrast. COMPARISON:  05/23/2010 CT FINDINGS: Please note that parenchymal abnormalities may be missed without intravenous contrast. Lower chest: No acute abnormality.  Mild cardiomegaly identified. Hepatobiliary: No hepatic abnormalities identified. The patient is status  post cholecystectomy. No biliary dilatation. Pancreas: Unremarkable Spleen: Unremarkable Adrenals/Urinary Tract: The kidneys, adrenal glands and bladder are unremarkable. Stomach/Bowel: A 4 x 6 x 5 cm thick-walled fluid collection containing gas is identified within the LOWER RIGHT pelvis likely representing abscess. The origin of this probable abscess is difficult to determine but there is a mildly enlarged adjacent appendix noted. A RIGHT tubo-ovarian abscess or small bowel/colonic origin are considerations as these structures lie in this area. There is a 1 cm calcification within this fluid/gas collection could represent an appendicolith. There is no evidence of bowel obstruction or other areas of bowel wall thickening. Colonic diverticulosis is identified. Vascular/Lymphatic: No significant vascular findings are present. No enlarged abdominal or pelvic lymph nodes. Reproductive: Status post  hysterectomy.  No LEFT adnexal masses. Other: A trace amount of free pelvic fluid is noted. No pneumoperitoneum. Musculoskeletal: No acute or suspicious bony abnormalities. Degenerative changes in the lumbar spine are identified. IMPRESSION: 1. 4 x 6 x 5 cm probable abscess within the LOWER RIGHT pelvis, of uncertain origin. Considerations include ruptured appendicitis as there is a mildly enlarged adjacent appendix noted. RIGHT tubo-ovarian abscess or abscess from adjacent small bowel/colon are also considerations. 2. No pneumoperitoneum or bowel obstruction. 3. Mild cardiomegaly. Electronically Signed   By: Margarette Canada M.D.   On: 12/17/2017 17:43   Dg Chest 2 View  Result Date: 12/17/2017 CLINICAL DATA:  Cough.  Chest discomfort. EXAM: CHEST - 2 VIEW COMPARISON:  12/18/2015 FINDINGS: There is new mild cardiomegaly. Pulmonary vascularity is normal and the lungs are clear. No effusions. Bones appear normal. IMPRESSION: New mild cardiomegaly. Electronically Signed   By: Lorriane Shire M.D.   On: 12/17/2017 11:24   Ct  Abdomen Pelvis W Contrast  Result Date: 01/08/2018 CLINICAL DATA:  Presentation on 12/17/2017 with what appeared to represent ruptured appendicitis with focal abscess. Status post percutaneous drainage of right pelvic fluid collection on 12/19/2017. The drain was exchanged and upsized on 01/02/2018 due to CT showing lack of resolution of the abscess. There has been persistent drainage of approximately 15-20 mL of light greenish colored fluid daily from the drain. EXAM: CT ABDOMEN AND PELVIS WITH CONTRAST TECHNIQUE: Multidetector CT imaging of the abdomen and pelvis was performed using the standard protocol following bolus administration of intravenous contrast. CONTRAST:  143mL ISOVUE-300 IOPAMIDOL (ISOVUE-300) INJECTION 61% COMPARISON:  12/25/2017 and 12/17/2017 FINDINGS: Lower chest: No acute abnormality. Hepatobiliary: No focal liver abnormality is seen. Status post cholecystectomy. No biliary dilatation. Pancreas: Unremarkable. No pancreatic ductal dilatation or surrounding inflammatory changes. Spleen: Normal in size without focal abnormality. Adrenals/Urinary Tract: Adrenal glands are unremarkable. Kidneys are normal, without renal calculi, focal lesion, or hydronephrosis. Bladder is unremarkable. Stomach/Bowel: Right pelvic drainage catheter remains in place and is formed at the level of a well-circumscribed complex fluid collection. This fluid collection continues to maintain similar geographic tubular morphology measuring approximately 4.2 cm in diameter and at least 5.5-6 cm in length. Focal area of increased density measuring roughly 11 mm again may represent a fecalith/appendicolith. There is new more diffuse area of increased density within the collection as well which may represent some interval hemorrhage or change in nature of the fluid. The right pelvic fluid collection continues to maintain a very similar shape to the original scan on 12/17/2017 and has not decreased significantly in size despite  catheter up sizing and appropriate drain positioning. This raises the possibility that the catheter is within a structure that is secreting fluid such as a dilated appendix, appendiceal mucocele or Meckel's diverticulum. No new abscess identified. No free intraperitoneal air. Vascular/Lymphatic: No significant vascular findings are present. No enlarged abdominal or pelvic lymph nodes. Reproductive: Status post hysterectomy. No adnexal masses. Other: No abdominal wall hernia. Musculoskeletal: No acute or significant osseous findings. IMPRESSION: No significant change in size, shape and morphology of the residual right pelvic fluid collection. Internal fluid shows some increased density which may be consistent with some internal hemorrhage. There remains a roughly 11 mm possible fecalith/appendicolith. Based on the lack of resolution despite drain exchange and up sizing as well as persistent tubular shape, differential of more than ruptured appendicitis would have to be considered. The drain may be directly within a dilated appendix and an appendiceal mucocele cannot be entirely  excluded. Another possibility would be a Meckel's diverticulum that became infected. Electronically Signed   By: Aletta Edouard M.D.   On: 01/08/2018 15:31   Ct Abdomen Pelvis W Contrast  Result Date: 12/25/2017 CLINICAL DATA:  Right lower quadrant abdominal abscess worrisome for appendicitis in the setting of a suspected appendicolith, post percutaneous drainage catheter placement on 12/19/2017. Patient presents today to the interventional radiology drain Clinic for drainage catheter evaluation and management. The patient has not been flushing the percutaneous drainage catheter since being discharged from the hospital this past weekend. Patient reports approximately 15-20 cc of purulent output from the drainage catheter per day. Patient continues on her prescribed course of antibiotics. Patient denies worsening abdominal pain, fever or  chills. EXAM: CT ABDOMEN AND PELVIS WITH CONTRAST TECHNIQUE: Multidetector CT imaging of the abdomen and pelvis was performed using the standard protocol following bolus administration of intravenous contrast. CONTRAST:  135mL OMNIPAQUE IOHEXOL 300 MG/ML  SOLN COMPARISON:  CT abdomen pelvis - 12/17/2017; CT-guided right lower quadrant percutaneous drainage catheter placement-12/19/2017 FINDINGS: Lower chest: Limited visualization of the lower thorax demonstrates minimal subsegmental atelectasis within the imaged left lower lobe. No pleural effusion. Cardiomegaly.  No pericardial effusion. Hepatobiliary: There is mild nodularity hepatic contour. Minimal amount of focal fatty infiltration adjacent to the fissure for the ligamentum teres. No discrete hepatic lesions. Post cholecystectomy. No intra extrahepatic bili duct dilatation. No ascites. Pancreas: Normal appearance of the pancreas Spleen: Normal appearance of the spleen Adrenals/Urinary Tract: There is symmetric enhancement of the bilateral kidneys. No definite renal stones on this postcontrast examination. No discrete renal lesions. No urinary obstruction or perinephric stranding. Normal appearance the bilateral adrenal glands. Normal appearance of the urinary bladder given underdistention. Stomach/Bowel: Slight retraction of right lower quadrant percutaneous drainage catheter however the end remains coiled within the peripheral aspect of the residual peripherally enhancing fluid collection which now measures approximately 5.2 x 4.7 x 3.8 cm as measured in greatest oblique short axis coronal (72, series 6) and axial (image 66, series 2) dimensions respectively, previously, 6.0 x 5.0 x 4.0 cm. Note is again made of a punctate (approximately 1 cm) potential appendicolith within the dependent portion of the residual fluid collection. This fluid collection is again noted to be intimately associated with the tip of the appendix. No new definable/drainable fluid  collections within the abdomen or pelvis. Moderate colonic stool burden without evidence of enteric obstruction. Scattered colonic diverticulosis without evidence of diverticulitis. Normal appearance of the terminal ileum. Vascular/Lymphatic: Mildly tortuous but normal caliber abdominal aorta. The major branch vessels of the abdominal aorta appear patent on this non CTA examination. No bulky retroperitoneal, mesenteric, pelvic or inguinal lymphadenopathy. Reproductive: Post hysterectomy.  No discrete adnexal lesion. Other: Regional soft tissues appear normal. 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 Grade 1 anterolisthesis of L5 upon S1 without associated pars defects. IMPRESSION: Slight retraction of right lower quadrant percutaneous drainage catheter with end remaining coiled and locked within the grossly unchanged to slightly reduced right lower quadrant presumed appendiceal abscess, currently measuring 5.2 cm in diameter, previously, 6 cm. No new definable/drainable fluid collections. PLAN: - Given persistent output from the percutaneous drainage catheter as well as residual size of the appendiceal abscess, the decision was made NOT to proceed with drainage catheter injection at this time. - The patient will return to the interventional  radiology drain clinic in 2 weeks for drainage catheter evaluation and management with repeat CT scan of the abdomen and pelvis and potential percutaneous drainage catheter injection. Electronically Signed   By: Sandi Mariscal M.D.   On: 12/25/2017 15:18   Ir Catheter Tube Change  Result Date: 01/02/2018 CLINICAL DATA:  History of right lower quadrant of intra-abdominal abscess worrisome for ruptured appendicitis in the setting of an appendicolith post  percutaneous drainage catheter placement on 12/19/2017. Patient was seen in the interventional radiology drain Clinic on 12/25/2017 however given persistent high volume output from the drainage catheter as well as persistent abscess seen on acquired abdominal CT, drainage catheter injection was not performed at that time. Unfortunately, patient presents today to the interventional radiology drain Clinic with decreased output from the percutaneous drainage catheter as well as leakage around the percutaneous drain. Patient reports she has not been flushing the percutaneous drainage catheter and completed her course of antibiotics earlier this week. Patient reports subjective fevers and worsening abdominal pain. EXAM: IR CATHETER TUBE CHANGE COMPARISON:  CT abdomen and pelvis - 12/25/2017 CONTRAST:  10 cc Isovue-300 - administered via the percutaneous drainage catheter. MEDICATIONS: None. ANESTHESIA/SEDATION: None FLUOROSCOPY TIME:  54 seconds (12 mGy) FINDINGS: Patient was positioned supine on the fluoroscopy table. External portion of the existing right lower quadrant percutaneous drainage catheter as well as the surrounding skin was prepped and draped in usual sterile fashion. A time-out was performed prior to initiation of procedure Preprocedural spot fluoroscopic image was obtained of the right lower abdominal quadrant existing percutaneous drainage catheter. Multiple spot fluoroscopic images were obtained from the injection of a small amount of contrast The external portion of the existing percutaneous drainage catheter was cut and cannulated with a short Amplatz wire. Under intermittent fluoroscopic guidance, the existing percutaneous drainage catheter exchanged for a new, slightly larger now 12 French percutaneous drainage catheter with end coiled and locked within the central aspect of the residual abscess cavity within the right lower abdominal quadrant. There is suspected opacification of the residual  appendix to the level of the cecum. The drainage catheter was reconnected to a JP bulb and sutured in place. A dressing was applied. The patient tolerated the procedure well without immediate postprocedural complication. IMPRESSION: 1. Successful fluoroscopic guided exchange, repositioning and up sizing of now 12 French percutaneous drainage catheter with end coiled and locked within the residual right lower quadrant abscess cavity. 2. Persistent fistulous connection between the residual appendiceal stump and the cecum. PLAN: - As the patient completed her antibiotics earlier this week, she was given a prescription for an additional 7 day course of Augmentin. - The patient was instructed to flush the percutaneous drainage catheter twice per day. - Patient was instructed to maintain diligent records regarding drainage catheter output. - The patient is to follow-up at the interventional radiology drain Clinic next Wednesday (July 17) with repeat CT scan of the abdomen pelvis and potential percutaneous drainage catheter injection. - The patient was encouraged to make a follow-up appointment with referring surgeon, Dr. Georgette Dover, as she will likely ultimately require definitive surgical repair Electronically Signed   By: Sandi Mariscal M.D.   On: 01/02/2018 17:36   Ct Image Guided Drainage By Percutaneous Catheter  Result Date: 12/19/2017 INDICATION: Right lower quadrant abdominal abscess worrisome for ruptured appendicitis in the setting of suspected appendicolith. Please perform CT-guided drainage catheter placement for infection source control purposes. EXAM: CT IMAGE GUIDED DRAINAGE BY PERCUTANEOUS CATHETER COMPARISON:  CT abdomen and pelvis -  12/17/2017 MEDICATIONS: The patient is currently admitted to the hospital and receiving intravenous antibiotics. The antibiotics were administered within an appropriate time frame prior to the initiation of the procedure. ANESTHESIA/SEDATION: Moderate (conscious) sedation was  employed during this procedure. A total of Versed 1.5 mg and Fentanyl 75 mcg was administered intravenously. Moderate Sedation Time: 17 minutes. The patient's level of consciousness and vital signs were monitored continuously by radiology nursing throughout the procedure under my direct supervision. CONTRAST:  None COMPLICATIONS: None immediate. PROCEDURE: Informed written consent was obtained from the patient after a discussion of the risks, benefits and alternatives to treatment. The patient was placed supine on the CT gantry and a pre procedural CT was performed re-demonstrating the known abscess/fluid collection within the right lower abdominal quadrant with dominant component measuring at least 6.8 x 4.5 cm (image 29, series 2). The procedure was planned. A timeout was performed prior to the initiation of the procedure. The skin overlying the ventral aspect of the right lower abdomen/pelvis was prepped and draped in the usual sterile fashion. The overlying soft tissues were anesthetized with 1% lidocaine with epinephrine. Appropriate trajectory was planned with the use of a 22 gauge spinal needle. An 18 gauge trocar needle was advanced into the abscess/fluid collection and a short Amplatz super stiff wire was coiled within the collection. Appropriate positioning was confirmed with a limited CT scan. The tract was serially dilated allowing placement of a 10 [place] all-purpose drainage catheter. Appropriate positioning was confirmed with a limited postprocedural CT scan. Approximately 60 ml of purulent fluid was aspirated. The tube was connected to a drainage bag and sutured in place. A dressing was placed. The patient tolerated the procedure well without immediate post procedural complication. IMPRESSION: Successful CT guided placement of a 10 French all purpose drain catheter into the right lower abdominal/pelvic abscess with aspiration of 60 mL of purulent fluid. Samples were sent to the laboratory as  requested by the ordering clinical team. Electronically Signed   By: Sandi Mariscal M.D.   On: 12/19/2017 15:45   Ir Radiologist Eval & Mgmt  Result Date: 01/08/2018 Please refer to notes tab for details about interventional procedure. (Op Note)  Ir Radiologist Eval & Mgmt  Result Date: 12/25/2017 Please refer to notes tab for details about interventional procedure. (Op Note)   Labs:  CBC: Recent Labs    12/20/17 0311 12/21/17 0232 12/22/17 0118 12/25/17 1336  WBC 14.3* 15.7* 14.1* 11.3*  HGB 9.5* 8.6* 9.8* 10.3*  HCT 29.6* 27.3* 31.1* 32.1*  PLT 294 273 334 543*    COAGS: Recent Labs    12/17/17 1352  INR 1.02    BMP: Recent Labs    12/20/17 0311 12/21/17 0232 12/22/17 0118 12/25/17 1336  NA 137 140 142 140  K 3.2* 3.5 3.1* 4.1  CL 110 114* 113* 107  CO2 17* 20* 19* 21  GLUCOSE 160* 97 130* 91  BUN 5* <5* <5* 4*  CALCIUM 7.3* 7.2* 7.4* 9.0  CREATININE 1.01* 1.03* 1.05* 0.77  GFRNONAA 58* 57* 56* 83  GFRAA >60 >60 >60 96    LIVER FUNCTION TESTS: Recent Labs    12/17/17 1136 12/19/17 0306 12/20/17 0311 12/22/17 0118  BILITOT 0.6 0.8 0.7 0.6  AST 49* 49* 36 42*  ALT 61* 63* 49* 57*  ALKPHOS 190* 224* 187* 193*  PROT 7.3 6.6 5.9* 6.3*  ALBUMIN 2.8* 2.3* 2.1* 2.1*    Assessment and Plan:  A follow-up CT was performed today and is dictated  separately.  This shows persistence of an irregular, tubular shaped well-circumscribed fluid collection containing the drainage catheter.  There is some increased density within the fluid compared to the prior scan which may represent some internal hemorrhage or change in internal density of the fluid.  Focal rounded area of increased density measuring 11 mm again noted which may represent a separate fecalith/appendicolith.  Appearance by CT currently is not entirely typical for an appendiceal abscess given lack of decompression and persistent similar shape of the collection compared to the original scan prior to drain  placement.  This raises the possibility that the drain is within a structure that is continuing to secrete fluid.  This may be within a dilated appendix or potentially a Meckel's diverticulum.  An appendix mucocele is not entirely excluded.  At this point, there likely would be little further benefit from catheter drainage other than as a bridge to surgery.  It was not felt necessary to inject the drain today under fluoroscopy given that it was recently injected during the drain exchange procedure 6 days ago.  I told Sarah Brock to keep her appointment with Dr. Georgette Dover on Friday.  I will speak to Dr. Georgette Dover about the CT findings today before her appointment.   Electronically SignedAletta Edouard T 01/08/2018, 3:44 PM   I spent a total of 15 Minutes in face to face in clinical consultation, greater than 50% of which was counseling/coordinating care for management of a right lower quadrant abscess drain.

## 2018-01-10 ENCOUNTER — Ambulatory Visit: Payer: Self-pay | Admitting: Surgery

## 2018-01-10 DIAGNOSIS — K3532 Acute appendicitis with perforation and localized peritonitis, without abscess: Secondary | ICD-10-CM | POA: Diagnosis not present

## 2018-01-10 NOTE — H&P (View-Only) (Signed)
History of Present Illness Sarah Brock. Sarah Gamboa MD; 01/10/2018 1:11 PM) The patient is a 62 year old female who presents with appendicitis. PCP - Sarah Cranker, DO  This is a 62 y/o F with a PMH HTN, DM, and seizures who presented to the hospital on 12/17/17 from her PCP office for renal failure. She endorses a 2-3 week history of allergy-type symptoms and right-sided back pain. Since the weekend patient also reports subjective fever/chills, nausea, 1 episode of emesis after taking Aleve, and central/lower abdominal pain. States that she has never had pain like this before. Pain was not improved with Aleve. Pain exacerbated by movement but did not worsen with PO intake. When her PCP drew labs she had abnormal BUN/Cr, LFTs, and leukocytosis and she was sent to the hospital. Workup of her pain included an abdominal CT scan that was significant for a RLQ abscess (4x6x5 cm) with an enlarged adjacent appendix. General surgery has been asked to consult regarding perforated viscous with abscess.  Patient does not take any blood thinners. 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 She has never had a colonoscopy before.  She underwent percutaneous drainage of an abscess on 12/19/17. She was discharged on 12/22/17. She has had 2 follow-up CT scans that show persistent fluid collection measuring 4.2 x 6 cm. She continues to have a moderate amount of foul-smelling drainage through her drain on a daily basis. There is concern that the drainage catheter is within a dilated appendix or appendiceal mucocele. She comes in for follow-up today. She has been afebrile and denies any nausea vomiting or diarrhea. She feels weak and tired all the time.   Study Result  CLINICAL DATA: Presentation on 12/17/2017 with what appeared to represent ruptured appendicitis with focal abscess. Status post percutaneous drainage of right pelvic fluid collection on  12/19/2017. The drain was exchanged and upsized on 01/02/2018 due to CT showing lack of resolution of the abscess. There has been persistent drainage of approximately 15-20 mL of light greenish colored fluid daily from the drain. EXAM: CT ABDOMEN AND PELVIS WITH CONTRAST TECHNIQUE: Multidetector CT imaging of the abdomen and pelvis was performed using the standard protocol following bolus administration of intravenous contrast. CONTRAST: 130mL ISOVUE-300 IOPAMIDOL (ISOVUE-300) INJECTION 61% COMPARISON: 12/25/2017 and 12/17/2017 FINDINGS: Lower chest: No acute abnormality. Hepatobiliary: No focal liver abnormality is seen. Status post cholecystectomy. No biliary dilatation. Pancreas: Unremarkable. No pancreatic ductal dilatation or surrounding inflammatory changes. Spleen: Normal in size without focal abnormality. Adrenals/Urinary Tract: Adrenal glands are unremarkable. Kidneys are normal, without renal calculi, focal lesion, or hydronephrosis. Bladder is unremarkable. Stomach/Bowel: Right pelvic drainage catheter remains in place and is formed at the level of a well-circumscribed complex fluid collection. This fluid collection continues to maintain similar geographic tubular morphology measuring approximately 4.2 cm in diameter and at least 5.5-6 cm in length. Focal area of increased density measuring roughly 11 mm again may represent a fecalith/appendicolith. There is new more diffuse area of increased density within the collection as well which may represent some interval hemorrhage or change in nature of the fluid. The right pelvic fluid collection continues to maintain a very similar shape to the original scan on 12/17/2017 and has not decreased significantly in size despite catheter up sizing and appropriate drain positioning. This raises the possibility that the catheter is within a structure that is secreting fluid such as a dilated appendix, appendiceal mucocele or Meckel's  diverticulum. No new abscess  identified. No free intraperitoneal air. Vascular/Lymphatic: No significant vascular findings are present. No enlarged abdominal or pelvic lymph nodes. Reproductive: Status post hysterectomy. No adnexal masses. Other: No abdominal wall hernia. Musculoskeletal: No acute or significant osseous findings. IMPRESSION: No significant change in size, shape and morphology of the residual right pelvic fluid collection. Internal fluid shows some increased density which may be consistent with some internal hemorrhage. There remains a roughly 11 mm possible fecalith/appendicolith. Based on the lack of resolution despite drain exchange and up sizing as well as persistent tubular shape, differential of more than ruptured appendicitis would have to be considered. The drain may be directly within a dilated appendix and an appendiceal mucocele cannot be entirely excluded. Another possibility would be a Meckel's diverticulum that became infected. Electronically Signed By: Aletta Edouard M.D. On: 01/08/2018 15:31        Problem List/Past Medical Sarah Key K. [name], MD; 01/10/2018 1:11 PM) PERFORATED APPENDICITIS (K35.32) GALL BLADDER STONES (K80.20) [02/23/2016]:  Past Surgical History Sarah Key K. Sarah Pryer, MD; 01/10/2018 1:11 PM) Hysterectomy (not due to cancer) - Partial Oral Surgery  Diagnostic Studies History Sarah Brock. [name], MD; 01/10/2018 1:11 PM) Colonoscopy never Pap Smear >5 years ago  Allergies Sarah Lorenzo, LPN; 9/79/8921 1:94 AM) Dia Sitter  Medication History Sarah Lorenzo, LPN; 1/74/0814 4:81 AM) [name] (10MG  Tablet, Oral) Active. Probiotic (Oral) Active. Tylenol (500MG  Capsule, Oral as needed) Active. ZyrTEC Allergy (10MG  Tablet, Oral) Active. LevETIRAcetam (500MG  Tablet, Oral) Active. Lisinopril (30MG  Tablet, Oral) Active. MetFORMIN HCl (500MG  Tablet, Oral) Active. Ferrous Sulfate (325 (65 Fe)MG Tablet, Oral) Active. Medications  Reconciled  Social History Sarah Brock. Sarah Molinelli, MD; 01/10/2018 1:11 PM) Alcohol use Occasional alcohol use. Caffeine use Coffee, Tea. No drug use Tobacco use Never smoker.  Family History Sarah Brock. Kineta Fudala, MD; 01/10/2018 1:11 PM) Diabetes Mellitus Son.  Pregnancy / Birth History Sarah Brock. Zamaria Brazzle, MD; 01/10/2018 1:11 PM) Age at menarche 21 years. Gravida 4 Maternal age 53-20 Para 3  Other Problems Sarah Brock. Damiel Barthold, MD; 01/10/2018 1:11 PM) Back Pain Cholelithiasis Diabetes Mellitus High blood pressure Seizure Disorder Transfusion history    Vitals Claiborne Billings Dockery LPN; 8/56/3149 7:02 AM) 01/10/2018 9:52 AM Weight: 161.4 lb Height: 62in Body Surface Area: 1.75 m Body Mass Index: 29.52 kg/m  Temp.: 97.75F(Temporal)  Pulse: 109 (Regular)  BP: 128/86 (Sitting, Left Arm, Standard)      Physical Exam Sarah Key K. Zorianna Taliaferro MD; 01/10/2018 1:12 PM)  The physical exam findings are as follows: Note:WDWN in NAD Eyes: Pupils equal, round; sclera anicteric HENT: Oral mucosa moist; good dentition Neck: No masses palpated, no thyromegaly Lungs: CTA bilaterally; normal respiratory effort CV: Regular rate and rhythm; no murmurs; extremities well-perfused with no edema Abd: +bowel sounds, soft, non-tender, no palpable organomegaly; RLQ drain with foul-smelling milky drainage; does not appear feculent Skin: Warm, dry; no sign of jaundice Psychiatric - alert and oriented x 4; calm mood and affect    Assessment & Plan Sarah Key K. Kyjuan Gause MD; 01/10/2018 1:13 PM)  PERFORATED APPENDICITIS (K35.32)  Current Plans Schedule for Surgery - laparoscopic appendectomy, possible open appendectomy. The surgical procedure has been discussed with the patient. Potential risks, benefits, alternative treatments, and expected outcomes have been explained. All of the patient's questions at this time have been answered. The likelihood of reaching the patient's treatment goal is  good. The patient understand the proposed surgical procedure and wishes to proceed. Note:She does not seem to be improving with percutaneous drain. We will need to proceed with surgery. We will attempt a laparoscopic appendectomy but  I informed the patient that she is at increased probability for having to convert to an open procedure. She may require an ileocecectomy to adequately resect this area. She will likely wake up with a drain still in place.  Sarah Brock. Georgette Dover, MD, [facility]  General/ Trauma Surgery  01/10/2018 1:13 PM

## 2018-01-10 NOTE — H&P (Signed)
History of Present Illness Sarah Brock. Sarah Greeno MD; 01/10/2018 1:11 PM) The patient is a 62 year old female who presents with appendicitis. PCP - Sarah Cranker, DO  This is a 62 y/o F with a PMH HTN, DM, and seizures who presented to the hospital on 12/17/17 from her PCP office for renal failure. She endorses a 2-3 week history of allergy-type symptoms and right-sided back pain. Since the weekend patient also reports subjective fever/chills, nausea, 1 episode of emesis after taking Aleve, and central/lower abdominal pain. States that she has never had pain like this before. Pain was not improved with Aleve. Pain exacerbated by movement but did not worsen with PO intake. When her PCP drew labs she had abnormal BUN/Cr, LFTs, and leukocytosis and she was sent to the hospital. Workup of her pain included an abdominal CT scan that was significant for a RLQ abscess (4x6x5 cm) with an enlarged adjacent appendix. General surgery has been asked to consult regarding perforated viscous with abscess.  Patient does not take any blood thinners. 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 She has never had a colonoscopy before.  She underwent percutaneous drainage of an abscess on 12/19/17. She was discharged on 12/22/17. She has had 2 follow-up CT scans that show persistent fluid collection measuring 4.2 x 6 cm. She continues to have a moderate amount of foul-smelling drainage through her drain on a daily basis. There is concern that the drainage catheter is within a dilated appendix or appendiceal mucocele. She comes in for follow-up today. She has been afebrile and denies any nausea vomiting or diarrhea. She feels weak and tired all the time.   Study Result  CLINICAL DATA: Presentation on 12/17/2017 with what appeared to represent ruptured appendicitis with focal abscess. Status post percutaneous drainage of right pelvic fluid collection on  12/19/2017. The drain was exchanged and upsized on 01/02/2018 due to CT showing lack of resolution of the abscess. There has been persistent drainage of approximately 15-20 mL of light greenish colored fluid daily from the drain. EXAM: CT ABDOMEN AND PELVIS WITH CONTRAST TECHNIQUE: Multidetector CT imaging of the abdomen and pelvis was performed using the standard protocol following bolus administration of intravenous contrast. CONTRAST: 130mL ISOVUE-300 IOPAMIDOL (ISOVUE-300) INJECTION 61% COMPARISON: 12/25/2017 and 12/17/2017 FINDINGS: Lower chest: No acute abnormality. Hepatobiliary: No focal liver abnormality is seen. Status post cholecystectomy. No biliary dilatation. Pancreas: Unremarkable. No pancreatic ductal dilatation or surrounding inflammatory changes. Spleen: Normal in size without focal abnormality. Adrenals/Urinary Tract: Adrenal glands are unremarkable. Kidneys are normal, without renal calculi, focal lesion, or hydronephrosis. Bladder is unremarkable. Stomach/Bowel: Right pelvic drainage catheter remains in place and is formed at the level of a well-circumscribed complex fluid collection. This fluid collection continues to maintain similar geographic tubular morphology measuring approximately 4.2 cm in diameter and at least 5.5-6 cm in length. Focal area of increased density measuring roughly 11 mm again may represent a fecalith/appendicolith. There is new more diffuse area of increased density within the collection as well which may represent some interval hemorrhage or change in nature of the fluid. The right pelvic fluid collection continues to maintain a very similar shape to the original scan on 12/17/2017 and has not decreased significantly in size despite catheter up sizing and appropriate drain positioning. This raises the possibility that the catheter is within a structure that is secreting fluid such as a dilated appendix, appendiceal mucocele or Meckel's  diverticulum. No new abscess  identified. No free intraperitoneal air. Vascular/Lymphatic: No significant vascular findings are present. No enlarged abdominal or pelvic lymph nodes. Reproductive: Status post hysterectomy. No adnexal masses. Other: No abdominal wall hernia. Musculoskeletal: No acute or significant osseous findings. IMPRESSION: No significant change in size, shape and morphology of the residual right pelvic fluid collection. Internal fluid shows some increased density which may be consistent with some internal hemorrhage. There remains a roughly 11 mm possible fecalith/appendicolith. Based on the lack of resolution despite drain exchange and up sizing as well as persistent tubular shape, differential of more than ruptured appendicitis would have to be considered. The drain may be directly within a dilated appendix and an appendiceal mucocele cannot be entirely excluded. Another possibility would be a Meckel's diverticulum that became infected. Electronically Signed By: Sarah Brock M.D. On: 01/08/2018 15:31        Problem List/Past Medical Sarah Key K. Natalyn Szymanowski, MD; 01/10/2018 1:11 PM) PERFORATED APPENDICITIS (K35.32) GALL BLADDER STONES (K80.20) [02/23/2016]:  Past Surgical History Sarah Key K. Enio Hornback, MD; 01/10/2018 1:11 PM) Hysterectomy (not due to cancer) - Partial Oral Surgery  Diagnostic Studies History Sarah Brock. Sarah Rodrigues, MD; 01/10/2018 1:11 PM) Colonoscopy never Pap Smear >5 years ago  Allergies Sarah Brock; 10/11/3788 2:40 AM) Sarah Brock  Medication History Sarah Brock; 9/73/5329 9:24 AM) Sarah Brock (10MG  Tablet, Oral) Active. Probiotic (Oral) Active. Tylenol (500MG  Capsule, Oral as needed) Active. ZyrTEC Allergy (10MG  Tablet, Oral) Active. LevETIRAcetam (500MG  Tablet, Oral) Active. Lisinopril (30MG  Tablet, Oral) Active. MetFORMIN HCl (500MG  Tablet, Oral) Active. Ferrous Sulfate (325 (65 Fe)MG Tablet, Oral) Active. Medications  Reconciled  Social History Sarah Brock. Sarah Lesmeister, MD; 01/10/2018 1:11 PM) Alcohol use Occasional alcohol use. Caffeine use Coffee, Tea. No drug use Tobacco use Never smoker.  Family History Sarah Brock. Zac Torti, MD; 01/10/2018 1:11 PM) Diabetes Mellitus Son.  Pregnancy / Birth History Sarah Brock. Dylann Layne, MD; 01/10/2018 1:11 PM) Age at menarche 41 years. Gravida 4 Maternal age 43-20 Para 3  Other Problems Sarah Brock. Shannen Vernon, MD; 01/10/2018 1:11 PM) Back Pain Cholelithiasis Diabetes Mellitus High blood pressure Seizure Disorder Transfusion history    Vitals Claiborne Billings Dockery Brock; 2/68/3419 6:22 AM) 01/10/2018 9:52 AM Weight: 161.4 lb Height: 62in Body Surface Area: 1.75 m Body Mass Index: 29.52 kg/m  Temp.: 97.58F(Temporal)  Pulse: 109 (Regular)  BP: 128/86 (Sitting, Left Arm, Standard)      Physical Exam Sarah Key K. Jozlyn Schatz MD; 01/10/2018 1:12 PM)  The physical exam findings are as follows: Note:WDWN in NAD Eyes: Pupils equal, round; sclera anicteric HENT: Oral mucosa moist; good dentition Neck: No masses palpated, no thyromegaly Lungs: CTA bilaterally; normal respiratory effort CV: Regular rate and rhythm; no murmurs; extremities well-perfused with no edema Abd: +bowel sounds, soft, non-tender, no palpable organomegaly; RLQ drain with foul-smelling milky drainage; does not appear feculent Skin: Warm, dry; no sign of jaundice Psychiatric - alert and oriented x 4; calm mood and affect    Assessment & Plan Sarah Key K. Sharron Simpson MD; 01/10/2018 1:13 PM)  PERFORATED APPENDICITIS (K35.32)  Current Plans Schedule for Surgery - laparoscopic appendectomy, possible open appendectomy. The surgical procedure has been discussed with the patient. Potential risks, benefits, alternative treatments, and expected outcomes have been explained. All of the patient's questions at this time have been answered. The likelihood of reaching the patient's treatment goal is  good. The patient understand the proposed surgical procedure and wishes to proceed. Note:She does not seem to be improving with percutaneous drain. We will need to proceed with surgery. We will attempt a laparoscopic appendectomy but  I informed the patient that she is at increased probability for having to convert to an open procedure. She may require an ileocecectomy to adequately resect this area. She will likely wake up with a drain still in place.  Sarah Brock. Georgette Dover, MD, [facility]  General/ Trauma Surgery  01/10/2018 1:13 PM

## 2018-01-14 ENCOUNTER — Encounter (HOSPITAL_COMMUNITY): Payer: Self-pay | Admitting: *Deleted

## 2018-01-14 ENCOUNTER — Other Ambulatory Visit: Payer: Self-pay

## 2018-01-14 MED ORDER — CEFOTETAN DISODIUM 2 G IJ SOLR
2.0000 g | INTRAMUSCULAR | Status: AC
  Administered 2018-01-15: 2 g via INTRAVENOUS
  Filled 2018-01-14: qty 2

## 2018-01-14 NOTE — Progress Notes (Signed)
Mrs Gundy has type II diabetes.  Patient reports that she does not check cbg daily, when she does check it, fasting runs 90-100.  I instructed patient to not take Metformin in am. I instructed patient to check CBG after awaking and every 2 hours until arrival  to the hospital.  I Instructed patient if CBG is less than 70 to  drink 1/2 cup of a clear juice. Recheck CBG in 15 minutes then call pre- op desk at [phone] for further instructions.

## 2018-01-15 ENCOUNTER — Inpatient Hospital Stay (HOSPITAL_COMMUNITY)
Admission: AD | Admit: 2018-01-15 | Discharge: 2018-01-20 | DRG: 331 | Disposition: A | Discharge status: Home / Self Care | Payer: 59 | Source: Ambulatory Visit | Attending: Surgery | Admitting: Surgery

## 2018-01-15 ENCOUNTER — Other Ambulatory Visit: Payer: Self-pay

## 2018-01-15 ENCOUNTER — Ambulatory Visit (HOSPITAL_COMMUNITY): Payer: 59 | Admitting: Anesthesiology

## 2018-01-15 ENCOUNTER — Encounter (HOSPITAL_COMMUNITY)
Admission: AD | Disposition: A | Discharge status: Home / Self Care | Payer: Self-pay | Source: Ambulatory Visit | Attending: Surgery

## 2018-01-15 ENCOUNTER — Encounter (HOSPITAL_COMMUNITY): Payer: Self-pay | Admitting: *Deleted

## 2018-01-15 DIAGNOSIS — K3533 Acute appendicitis with perforation and localized peritonitis, with abscess: Secondary | ICD-10-CM | POA: Diagnosis not present

## 2018-01-15 DIAGNOSIS — I10 Essential (primary) hypertension: Secondary | ICD-10-CM | POA: Diagnosis not present

## 2018-01-15 DIAGNOSIS — K5710 Diverticulosis of small intestine without perforation or abscess without bleeding: Secondary | ICD-10-CM | POA: Diagnosis not present

## 2018-01-15 DIAGNOSIS — K559 Vascular disorder of intestine, unspecified: Secondary | ICD-10-CM | POA: Diagnosis not present

## 2018-01-15 DIAGNOSIS — G40909 Epilepsy, unspecified, not intractable, without status epilepticus: Secondary | ICD-10-CM | POA: Diagnosis present

## 2018-01-15 DIAGNOSIS — K3532 Acute appendicitis with perforation and localized peritonitis, without abscess: Secondary | ICD-10-CM | POA: Diagnosis present

## 2018-01-15 DIAGNOSIS — Z7984 Long term (current) use of oral hypoglycemic drugs: Secondary | ICD-10-CM | POA: Diagnosis not present

## 2018-01-15 DIAGNOSIS — E119 Type 2 diabetes mellitus without complications: Secondary | ICD-10-CM | POA: Diagnosis not present

## 2018-01-15 DIAGNOSIS — Z5331 Laparoscopic surgical procedure converted to open procedure: Secondary | ICD-10-CM | POA: Diagnosis not present

## 2018-01-15 DIAGNOSIS — K3521 Acute appendicitis with generalized peritonitis, with abscess: Secondary | ICD-10-CM | POA: Diagnosis not present

## 2018-01-15 HISTORY — PX: LAPAROSCOPIC APPENDECTOMY: SHX408

## 2018-01-15 LAB — HEMOGLOBIN A1C
HEMOGLOBIN A1C: 7.4 % — AB (ref 4.8–5.6)
Hgb A1c MFr Bld: 7.3 % — ABNORMAL HIGH (ref 4.8–5.6)
MEAN PLASMA GLUCOSE: 162.81 mg/dL
MEAN PLASMA GLUCOSE: 165.68 mg/dL

## 2018-01-15 LAB — BASIC METABOLIC PANEL
ANION GAP: 11 (ref 5–15)
BUN: 7 mg/dL — ABNORMAL LOW (ref 8–23)
CO2: 22 mmol/L (ref 22–32)
Calcium: 8.7 mg/dL — ABNORMAL LOW (ref 8.9–10.3)
Chloride: 106 mmol/L (ref 98–111)
Creatinine, Ser: 0.82 mg/dL (ref 0.44–1.00)
GFR calc Af Amer: >60 mL/min (ref >60)
GLUCOSE: 108 mg/dL — AB (ref 70–99)
POTASSIUM: 3.9 mmol/L (ref 3.5–5.1)
SODIUM: 139 mmol/L (ref 135–145)

## 2018-01-15 LAB — GLUCOSE, CAPILLARY
GLUCOSE-CAPILLARY: 151 mg/dL — AB (ref 70–99)
GLUCOSE-CAPILLARY: 235 mg/dL — AB (ref 70–99)
GLUCOSE-CAPILLARY: 93 mg/dL (ref 70–99)
Glucose-Capillary: 104 mg/dL — ABNORMAL HIGH (ref 70–99)
Glucose-Capillary: 227 mg/dL — ABNORMAL HIGH (ref 70–99)
Glucose-Capillary: 192 mg/dL — ABNORMAL HIGH (ref 70–99)

## 2018-01-15 LAB — CBC
HEMATOCRIT: 31.3 % — AB (ref 36.0–46.0)
HEMOGLOBIN: 9.5 g/dL — AB (ref 12.0–15.0)
MCH: 24.9 pg — ABNORMAL LOW (ref 26.0–34.0)
MCHC: 30.4 g/dL (ref 30.0–36.0)
MCV: 82.2 fL (ref 78.0–100.0)
Platelets: 472 10*3/uL — ABNORMAL HIGH (ref 150–400)
RBC: 3.81 MIL/uL — ABNORMAL LOW (ref 3.87–5.11)
RDW: 15.4 % (ref 11.5–15.5)
WBC: 7.2 10*3/uL (ref 4.0–10.5)

## 2018-01-15 SURGERY — APPENDECTOMY, LAPAROSCOPIC
Anesthesia: General | Site: Abdomen

## 2018-01-15 MED ORDER — BUPIVACAINE-EPINEPHRINE 0.25% -1:200000 IJ SOLN
INTRAMUSCULAR | Status: DC | PRN
  Administered 2018-01-15: 10 mL

## 2018-01-15 MED ORDER — METOCLOPRAMIDE HCL 5 MG/ML IJ SOLN
INTRAMUSCULAR | Status: DC | PRN
  Administered 2018-01-15: 5 mg via INTRAVENOUS

## 2018-01-15 MED ORDER — FENTANYL CITRATE (PF) 100 MCG/2ML IJ SOLN
INTRAMUSCULAR | Status: DC | PRN
  Administered 2018-01-15 (×8): 50 ug via INTRAVENOUS

## 2018-01-15 MED ORDER — MIDAZOLAM HCL 2 MG/2ML IJ SOLN
INTRAMUSCULAR | Status: AC
  Filled 2018-01-15: qty 2

## 2018-01-15 MED ORDER — DIPHENHYDRAMINE HCL 50 MG/ML IJ SOLN: 12.5000 mg | Freq: Four times a day (QID) | INTRAMUSCULAR | Status: DC | PRN

## 2018-01-15 MED ORDER — ROCURONIUM BROMIDE 10 MG/ML (PF) SYRINGE
PREFILLED_SYRINGE | INTRAVENOUS | Status: DC | PRN
  Administered 2018-01-15: 50 mg via INTRAVENOUS
  Administered 2018-01-15 (×2): 20 mg via INTRAVENOUS

## 2018-01-15 MED ORDER — ROCURONIUM BROMIDE 10 MG/ML (PF) SYRINGE: PREFILLED_SYRINGE | INTRAVENOUS | Status: DC | PRN

## 2018-01-15 MED ORDER — PHENOL 1.4 % MT LIQD: 1.0000 | OROMUCOSAL | Status: DC | PRN

## 2018-01-15 MED ORDER — CHLORHEXIDINE GLUCONATE CLOTH 2 % EX PADS: 6.0000 | MEDICATED_PAD | Freq: Once | CUTANEOUS | Status: DC

## 2018-01-15 MED ORDER — EZETIMIBE 10 MG PO TABS
10.0000 mg | ORAL_TABLET | Freq: Every day | ORAL | Status: DC
  Administered 2018-01-15 – 2018-01-20 (×6): 10 mg via ORAL
  Filled 2018-01-15 (×6): qty 1

## 2018-01-15 MED ORDER — METOPROLOL TARTRATE 5 MG/5ML IV SOLN: 5.0000 mg | Freq: Four times a day (QID) | INTRAVENOUS | Status: DC | PRN

## 2018-01-15 MED ORDER — OXYCODONE HCL 5 MG PO TABS: 5.0000 mg | ORAL_TABLET | Freq: Once | ORAL | Status: DC | PRN

## 2018-01-15 MED ORDER — ACETAMINOPHEN 650 MG RE SUPP: 650.0000 mg | Freq: Four times a day (QID) | RECTAL | Status: DC | PRN

## 2018-01-15 MED ORDER — LACTATED RINGERS IV SOLN
INTRAVENOUS | Status: DC
  Administered 2018-01-15 (×2): via INTRAVENOUS

## 2018-01-15 MED ORDER — SODIUM CHLORIDE 0.9 % IR SOLN
Status: DC | PRN
  Administered 2018-01-15: 1000 mL

## 2018-01-15 MED ORDER — LABETALOL HCL 5 MG/ML IV SOLN
INTRAVENOUS | Status: DC | PRN
  Administered 2018-01-15 (×4): 5 mg via INTRAVENOUS

## 2018-01-15 MED ORDER — MORPHINE SULFATE (PF) 2 MG/ML IV SOLN
INTRAVENOUS | Status: AC
  Filled 2018-01-15: qty 1

## 2018-01-15 MED ORDER — FENTANYL CITRATE (PF) 100 MCG/2ML IJ SOLN
25.0000 ug | INTRAMUSCULAR | Status: DC | PRN
  Administered 2018-01-15 (×2): 50 ug via INTRAVENOUS

## 2018-01-15 MED ORDER — LIDOCAINE 2% (20 MG/ML) 5 ML SYRINGE
INTRAMUSCULAR | Status: AC
  Filled 2018-01-15: qty 5

## 2018-01-15 MED ORDER — SUGAMMADEX SODIUM 200 MG/2ML IV SOLN
INTRAVENOUS | Status: AC
  Filled 2018-01-15: qty 2

## 2018-01-15 MED ORDER — INSULIN ASPART 100 UNIT/ML ~~LOC~~ SOLN
0.0000 [IU] | SUBCUTANEOUS | Status: DC
  Administered 2018-01-15: 3 [IU] via SUBCUTANEOUS
  Administered 2018-01-15: 5 [IU] via SUBCUTANEOUS
  Administered 2018-01-16 (×2): 2 [IU] via SUBCUTANEOUS
  Administered 2018-01-16 – 2018-01-19 (×2): 3 [IU] via SUBCUTANEOUS
  Administered 2018-01-19: 2 [IU] via SUBCUTANEOUS

## 2018-01-15 MED ORDER — MORPHINE SULFATE (PF) 2 MG/ML IV SOLN
2.0000 mg | INTRAVENOUS | Status: DC | PRN
  Administered 2018-01-15: 4 mg via INTRAVENOUS
  Administered 2018-01-15 – 2018-01-16 (×5): 2 mg via INTRAVENOUS
  Administered 2018-01-17: 4 mg via INTRAVENOUS
  Administered 2018-01-17: 2 mg via INTRAVENOUS
  Administered 2018-01-17: 4 mg via INTRAVENOUS
  Administered 2018-01-18 – 2018-01-20 (×4): 2 mg via INTRAVENOUS
  Filled 2018-01-15: qty 1
  Filled 2018-01-15 (×2): qty 2
  Filled 2018-01-15 (×4): qty 1
  Filled 2018-01-15: qty 2
  Filled 2018-01-15 (×3): qty 1
  Filled 2018-01-15: qty 2

## 2018-01-15 MED ORDER — MIDAZOLAM HCL 5 MG/5ML IJ SOLN
INTRAMUSCULAR | Status: DC | PRN
  Administered 2018-01-15: 2 mg via INTRAVENOUS

## 2018-01-15 MED ORDER — DIPHENHYDRAMINE HCL 12.5 MG/5ML PO ELIX
12.5000 mg | ORAL_SOLUTION | Freq: Four times a day (QID) | ORAL | Status: DC | PRN
  Administered 2018-01-15: 12.5 mg via ORAL
  Filled 2018-01-15: qty 10

## 2018-01-15 MED ORDER — PROPOFOL 10 MG/ML IV BOLUS
INTRAVENOUS | Status: DC | PRN
  Administered 2018-01-15: 150 mg via INTRAVENOUS

## 2018-01-15 MED ORDER — METOCLOPRAMIDE HCL 5 MG/ML IJ SOLN
INTRAMUSCULAR | Status: AC
  Filled 2018-01-15: qty 2

## 2018-01-15 MED ORDER — OXYCODONE HCL 5 MG/5ML PO SOLN: 5.0000 mg | Freq: Once | ORAL | Status: DC | PRN

## 2018-01-15 MED ORDER — ONDANSETRON HCL 4 MG/2ML IJ SOLN
INTRAMUSCULAR | Status: AC
  Filled 2018-01-15: qty 2

## 2018-01-15 MED ORDER — METHOCARBAMOL 500 MG PO TABS
500.0000 mg | ORAL_TABLET | Freq: Four times a day (QID) | ORAL | Status: DC | PRN
  Administered 2018-01-15 – 2018-01-18 (×3): 500 mg via ORAL
  Filled 2018-01-15 (×4): qty 1

## 2018-01-15 MED ORDER — ONDANSETRON HCL 4 MG/2ML IJ SOLN
INTRAMUSCULAR | Status: DC | PRN
  Administered 2018-01-15: 4 mg via INTRAVENOUS

## 2018-01-15 MED ORDER — ONDANSETRON HCL 4 MG/2ML IJ SOLN
4.0000 mg | Freq: Four times a day (QID) | INTRAMUSCULAR | Status: DC | PRN
  Administered 2018-01-17 – 2018-01-19 (×2): 4 mg via INTRAVENOUS
  Filled 2018-01-15 (×2): qty 2

## 2018-01-15 MED ORDER — KETOROLAC TROMETHAMINE 30 MG/ML IJ SOLN
30.0000 mg | Freq: Four times a day (QID) | INTRAMUSCULAR | Status: DC
  Administered 2018-01-15 – 2018-01-20 (×17): 30 mg via INTRAVENOUS
  Filled 2018-01-15 (×18): qty 1

## 2018-01-15 MED ORDER — DEXAMETHASONE SODIUM PHOSPHATE 10 MG/ML IJ SOLN
INTRAMUSCULAR | Status: AC
  Filled 2018-01-15: qty 1

## 2018-01-15 MED ORDER — BUPIVACAINE-EPINEPHRINE (PF) 0.25% -1:200000 IJ SOLN
INTRAMUSCULAR | Status: AC
  Filled 2018-01-15: qty 30

## 2018-01-15 MED ORDER — SUGAMMADEX SODIUM 200 MG/2ML IV SOLN
INTRAVENOUS | Status: DC | PRN
Start: 2018-01-15 — End: 2018-01-15
  Administered 2018-01-15: 200 mg via INTRAVENOUS

## 2018-01-15 MED ORDER — ACETAMINOPHEN 325 MG PO TABS
650.0000 mg | ORAL_TABLET | Freq: Four times a day (QID) | ORAL | Status: DC | PRN
  Filled 2018-01-15: qty 2

## 2018-01-15 MED ORDER — 0.9 % SODIUM CHLORIDE (POUR BTL) OPTIME
TOPICAL | Status: DC | PRN
Start: 2018-01-15 — End: 2018-01-15
  Administered 2018-01-15: 1000 mL

## 2018-01-15 MED ORDER — ONDANSETRON HCL 4 MG/2ML IJ SOLN: 4.0000 mg | Freq: Once | INTRAMUSCULAR | Status: DC | PRN

## 2018-01-15 MED ORDER — PIPERACILLIN-TAZOBACTAM 3.375 G IVPB
3.3750 g | Freq: Three times a day (TID) | INTRAVENOUS | Status: DC
  Administered 2018-01-15 – 2018-01-20 (×14): 3.375 g via INTRAVENOUS
  Filled 2018-01-15 (×14): qty 50

## 2018-01-15 MED ORDER — LEVETIRACETAM 500 MG PO TABS
500.0000 mg | ORAL_TABLET | Freq: Two times a day (BID) | ORAL | Status: DC
  Administered 2018-01-15 – 2018-01-20 (×10): 500 mg via ORAL
  Filled 2018-01-15 (×10): qty 1

## 2018-01-15 MED ORDER — FENTANYL CITRATE (PF) 100 MCG/2ML IJ SOLN
INTRAMUSCULAR | Status: AC
  Filled 2018-01-15: qty 2

## 2018-01-15 MED ORDER — HYDRALAZINE HCL 20 MG/ML IJ SOLN
10.0000 mg | INTRAMUSCULAR | Status: DC | PRN
  Administered 2018-01-18: 10 mg via INTRAVENOUS
  Filled 2018-01-15: qty 1

## 2018-01-15 MED ORDER — ROCURONIUM BROMIDE 10 MG/ML (PF) SYRINGE
PREFILLED_SYRINGE | INTRAVENOUS | Status: AC
  Filled 2018-01-15: qty 10

## 2018-01-15 MED ORDER — POTASSIUM CHLORIDE IN NACL 20-0.9 MEQ/L-% IV SOLN
INTRAVENOUS | Status: DC
  Administered 2018-01-15 – 2018-01-20 (×10): via INTRAVENOUS
  Filled 2018-01-15 (×10): qty 1000

## 2018-01-15 MED ORDER — HYDRALAZINE HCL 20 MG/ML IJ SOLN
INTRAMUSCULAR | Status: DC | PRN
  Administered 2018-01-15: 2.5 mg via INTRAVENOUS
  Administered 2018-01-15: 10 mg via INTRAVENOUS
  Administered 2018-01-15 (×2): 2.5 mg via INTRAVENOUS

## 2018-01-15 MED ORDER — LIDOCAINE 2% (20 MG/ML) 5 ML SYRINGE
INTRAMUSCULAR | Status: DC | PRN
  Administered 2018-01-15: 40 mg via INTRAVENOUS

## 2018-01-15 MED ORDER — LISINOPRIL 20 MG PO TABS
30.0000 mg | ORAL_TABLET | Freq: Every day | ORAL | Status: DC
  Administered 2018-01-16 – 2018-01-20 (×5): 30 mg via ORAL
  Filled 2018-01-15 (×5): qty 1

## 2018-01-15 MED ORDER — ONDANSETRON 4 MG PO TBDP: 4.0000 mg | ORAL_TABLET | Freq: Four times a day (QID) | ORAL | Status: DC | PRN

## 2018-01-15 MED ORDER — FENTANYL CITRATE (PF) 250 MCG/5ML IJ SOLN
INTRAMUSCULAR | Status: AC
  Filled 2018-01-15: qty 5

## 2018-01-15 MED ORDER — ENOXAPARIN SODIUM 40 MG/0.4ML ~~LOC~~ SOLN
40.0000 mg | SUBCUTANEOUS | Status: DC
  Administered 2018-01-16 – 2018-01-20 (×5): 40 mg via SUBCUTANEOUS
  Filled 2018-01-15 (×5): qty 0.4

## 2018-01-15 SURGICAL SUPPLY — 68 items
APPLIER CLIP ROT 10 11.4 M/L (STAPLE)
BENZOIN TINCTURE PRP APPL 2/3 (GAUZE/BANDAGES/DRESSINGS) ×2 IMPLANT
BLADE CLIPPER SURG (BLADE) IMPLANT
BLADE SURG 10 STRL SS (BLADE) ×2 IMPLANT
CANISTER SUCT 3000ML PPV (MISCELLANEOUS) ×2 IMPLANT
CANISTER WOUNDNEG PRESSURE 500 (CANNISTER) ×2 IMPLANT
CHLORAPREP W/TINT 26ML (MISCELLANEOUS) ×2 IMPLANT
CLIP APPLIE ROT 10 11.4 M/L (STAPLE) IMPLANT
COVER SURGICAL LIGHT HANDLE (MISCELLANEOUS) ×2 IMPLANT
CUTTER FLEX LINEAR 45M (STAPLE) IMPLANT
DRAIN CHANNEL 19F RND (DRAIN) ×2 IMPLANT
DRAPE WARM FLUID 44X44 (DRAPE) ×2 IMPLANT
DRSG TEGADERM 2-3/8X2-3/4 SM (GAUZE/BANDAGES/DRESSINGS) ×2 IMPLANT
DRSG TEGADERM 4X4.75 (GAUZE/BANDAGES/DRESSINGS) ×2 IMPLANT
DRSG VAC ATS MED SENSATRAC (GAUZE/BANDAGES/DRESSINGS) ×2 IMPLANT
ELECT BLADE 6.5 EXT (BLADE) ×2 IMPLANT
ELECT REM PT RETURN 9FT ADLT (ELECTROSURGICAL) ×2
ELECTRODE REM PT RTRN 9FT ADLT (ELECTROSURGICAL) ×1 IMPLANT
ENDOLOOP SUT PDS II  0 18 (SUTURE)
ENDOLOOP SUT PDS II 0 18 (SUTURE) IMPLANT
EVACUATOR SILICONE 100CC (DRAIN) ×2 IMPLANT
FILTER SMOKE EVAC LAPAROSHD (FILTER) IMPLANT
GAUZE SPONGE 2X2 8PLY STRL LF (GAUZE/BANDAGES/DRESSINGS) ×1 IMPLANT
GLOVE BIO SURGEON STRL SZ7 (GLOVE) ×2 IMPLANT
GLOVE BIOGEL PI IND STRL 7.5 (GLOVE) ×1 IMPLANT
GLOVE BIOGEL PI INDICATOR 7.5 (GLOVE) ×1
GOWN STRL REUS W/ TWL LRG LVL3 (GOWN DISPOSABLE) ×3 IMPLANT
GOWN STRL REUS W/TWL LRG LVL3 (GOWN DISPOSABLE) ×3
KIT BASIN OR (CUSTOM PROCEDURE TRAY) ×2 IMPLANT
KIT TURNOVER KIT B (KITS) ×2 IMPLANT
NS IRRIG 1000ML POUR BTL (IV SOLUTION) ×2 IMPLANT
PAD ARMBOARD 7.5X6 YLW CONV (MISCELLANEOUS) ×4 IMPLANT
PENCIL BUTTON HOLSTER BLD 10FT (ELECTRODE) ×2 IMPLANT
POUCH SPECIMEN RETRIEVAL 10MM (ENDOMECHANICALS) IMPLANT
RELOAD PROXIMATE 75MM BLUE (ENDOMECHANICALS) ×4 IMPLANT
RELOAD STAPLE TA45 3.5 REG BLU (ENDOMECHANICALS) IMPLANT
RELOAD STAPLER LINEAR PROX 30 (STAPLE) ×1 IMPLANT
SCISSORS ENDO CVD 5DCS (MISCELLANEOUS) IMPLANT
SCISSORS LAP 5X35 DISP (ENDOMECHANICALS) ×2 IMPLANT
SET IRRIG TUBING LAPAROSCOPIC (IRRIGATION / IRRIGATOR) ×2 IMPLANT
SHEARS HARMONIC ACE PLUS 36CM (ENDOMECHANICALS) ×2 IMPLANT
SLEEVE ENDOPATH XCEL 5M (ENDOMECHANICALS) ×2 IMPLANT
SPECIMEN JAR SMALL (MISCELLANEOUS) ×2 IMPLANT
SPONGE GAUZE 2X2 STER 10/PKG (GAUZE/BANDAGES/DRESSINGS) ×1
SPONGE INTESTINAL PEANUT (DISPOSABLE) ×2 IMPLANT
STAPLER GUN LINEAR PROX 60 (STAPLE) ×2 IMPLANT
STAPLER PROXIMATE 75MM BLUE (STAPLE) ×2 IMPLANT
STAPLER RELOAD LINEAR PROX 30 (STAPLE) ×2
STRIP CLOSURE SKIN 1/2X4 (GAUZE/BANDAGES/DRESSINGS) ×2 IMPLANT
SUT ETHILON 2 0 FS 18 (SUTURE) ×2 IMPLANT
SUT MNCRL AB 4-0 PS2 18 (SUTURE) ×2 IMPLANT
SUT PDS AB 1 TP1 96 (SUTURE) ×4 IMPLANT
SUT SILK 2 0 (SUTURE) ×1
SUT SILK 2 0 SH CR/8 (SUTURE) ×2 IMPLANT
SUT SILK 2-0 18XBRD TIE 12 (SUTURE) ×1 IMPLANT
SUT SILK 3 0 (SUTURE) ×1
SUT SILK 3 0 SH 30 (SUTURE) ×2 IMPLANT
SUT SILK 3 0 SH CR/8 (SUTURE) ×4 IMPLANT
SUT SILK 3-0 18XBRD TIE 12 (SUTURE) ×1 IMPLANT
SYR BULB IRRIGATION 50ML (SYRINGE) ×2 IMPLANT
TOWEL OR 17X24 6PK STRL BLUE (TOWEL DISPOSABLE) ×2 IMPLANT
TOWEL OR 17X26 10 PK STRL BLUE (TOWEL DISPOSABLE) ×2 IMPLANT
TRAY LAPAROSCOPIC MC (CUSTOM PROCEDURE TRAY) ×2 IMPLANT
TROCAR XCEL BLUNT TIP 100MML (ENDOMECHANICALS) ×2 IMPLANT
TROCAR XCEL NON-BLD 5MMX100MML (ENDOMECHANICALS) ×2 IMPLANT
TUBING INSUFFLATION (TUBING) ×2 IMPLANT
WATER STERILE IRR 1000ML POUR (IV SOLUTION) ×2 IMPLANT
YANKAUER SUCT BULB TIP NO VENT (SUCTIONS) ×2 IMPLANT

## 2018-01-15 NOTE — Anesthesia Postprocedure Evaluation (Signed)
Anesthesia Post Note  Patient: JOSCELYNN BRUTUS  Procedure(s) Performed: LAPAROSCOPIC APPENDECTOMY CONVERTED TO OPEN APPENDECTOMY SMALL BOWEL RESECTION (N/A Abdomen)     Patient location during evaluation: PACU Anesthesia Type: General Level of consciousness: awake and alert Pain management: pain level controlled Vital Signs Assessment: post-procedure vital signs reviewed and stable Respiratory status: spontaneous breathing, nonlabored ventilation, respiratory function stable and patient connected to nasal cannula oxygen Cardiovascular status: blood pressure returned to baseline and stable Postop Assessment: no apparent nausea or vomiting Anesthetic complications: no    Last Vitals:  Vitals:   01/15/18 1824 01/15/18 1839  BP: (!) 147/81 (!) 166/82  Pulse: 93 95  Resp: 17 19  Temp:  36.6 C  SpO2: 98% 99%    Last Pain:  Vitals:   01/15/18 1906  TempSrc:   PainSc: 10-Worst pain ever                 Troyce Febo COKER

## 2018-01-15 NOTE — Op Note (Addendum)
Preop diagnosis: Perforated appendicitis  Postop diagnosis: Perforated appendicitis with pelvic abscess with involved small bowel Procedure performed: Laparoscopic appendectomy converted to exploratory laparotomy, open appendectomy, small bowel resection, placement of pelvic drain, placement of wound VAC (2 x 8 cm) Surgeon:Jeramy Dimmick K Gildo Crisco  Assistant:  Dr. Adonis Housekeeper Anesthesia: General endotracheal Indications:This is a 62 y/o F with a PMH HTN, DM, and seizures who presented to the hospital on 12/17/17 from her PCP office for renal failure.  She was found to have a perforated appendix with a large abscess. She underwent percutaneous drainage of an abscess on 12/19/17. She was discharged on 12/22/17. She has had 2 follow-up CT scans that show persistent fluid collection measuring 4.2 x 6 cm. She continues to have a moderate amount of foul-smelling drainage through her drain on a daily basis. There is concern that the drainage catheter is within a dilated appendix or appendiceal mucocele. She comes in today for appendectomy/ possible open appendectomy.    Procedure Details  The patient was seen again in the Holding Room. The risks, benefits, complications, treatment options, and expected outcomes were discussed with the patient and/or family. The possibilities of reaction to medication, perforation of viscus, bleeding, recurrent infection, finding a normal appendix, the need for additional procedures, failure to diagnose a condition, and creating a complication requiring transfusion or operation were discussed. There was concurrence with the proposed plan and informed consent was obtained. The site of surgery was properly noted. The patient was taken to Operating Room, identified as Sarah Brock and the procedure verified as Appendectomy. A Time Out was held and the above information confirmed.  The patient was placed in the supine position and general anesthesia was induced.  The abdomen was  prepped and draped in a sterile fashion. A one centimeter infraumbilical incision was made.  Dissection was carried down to the fascia bluntly.  The fascia was incised vertically.  We entered the peritoneal cavity bluntly.  A pursestring suture was passed around the incision with a 0 Vicryl.  The Hasson cannula was introduced into the abdomen and the tails of the suture were used to hold the Hasson in place.   The pneumoperitoneum was then established maintaining a maximum pressure of 15 mmHg.  Additional 5 mm cannulas then placed in the left lower quadrant of the abdomen and the right upper quadrant under direct visualization. A careful evaluation of the entire abdomen was carried out. The patient was placed in Trendelenburg and left lateral decubitus position.  The scope was moved to the right upper quadrant port site. The cecum was mobilized medially.  The appendix was identified exiting the cecum.  The base of the appendix appears to be normal.  The distal appendix seems to enter a large mass of inflamed small bowel in the right pelvis.  I attempted to dissect the small bowel away from the lateral abdominal wall.  However the bowel was densely adherent and it appeared that we would tear the small bowel if we continued with this dissection.  I tried to dissect posteriorly where the appendix enters the deeper portion of the inflammatory mass in the right pelvis.  However I was not able to safely dissected in this area laparoscopically.  Therefore we made the decision to convert to an open procedure.  We removed our trochars and released insufflation.  I made a lower midline incision up to the umbilicus.  We dissected down to the fascia which was opened vertically at the linea alba.  A  Balfour retractor was then placed to provide some exposure.  I was able to identify the cecum which appeared to be normal.  The base of the appendix shows no sign of perforation or inflammation.  Dissected around the base of the  appendix and we ligated this with a TA-30 stapler.  The appendix was divided in the base of the appendix was tied off with a 2-0 silk suture.  The mesoappendix was divided with the harmonic scalpel.  The distal portion of the appendix disappears into the inflammatory mass in the right lower quadrant.  We were able to bluntly dissect into the abscess cavity which was quickly suctioned.  Using a combination of blunt dissection and sharp dissection I was able to dissect the segment of small bowel off of the lateral abdominal wall.  The small bowel is densely adherent to the abscess cavity.  We were able to finally dissect the small bowel loop out of the pelvis.  The appendix is stuck in the posterior part of this inflammatory mass.  The involved segment of small bowel is in the terminal ileum about 15 cm proximal to the ileocecal valve.  We divided the small bowel proximal and distal to the inflammatory mass with a GIA-75 stapler.  A side-to-side anastomosis was then created with a GIA-75 and a TA 60 stapler.  The staple line was oversewn with 3-0 silk.  3-0 silk was placed at the crotch of the anastomosis.  The mesenteric defect was closed with 2-0 silk.  We then irrigated the pelvis thoroughly and inspected for hemostasis.  The appendiceal stump appears intact with no sign of ischemia or leak.  A pelvic drain was brought in through the left lower quadrant port site and placed within the remainder of the pelvic abscess.  We irrigated the abdomen thoroughly again.  The drain was secured with 2-0 nylon.  The port site in the right upper quadrant was closed with 4-0 Monocryl.  The fascia was then reapproximated with double-stranded #1 PDS suture.  The subcutaneous tissues were irrigated.  A small VAC sponge was cut to fit the subcutaneous portion of the wound.  This was sealed with an occlusive drape and placed to 125 mmHg suction.  The patient was then extubated and brought to the recovery room in stable condition.  All  sponge, instrument, and needle counts are correct.   Findings: The appendix was found to be inflamed. There were signs of necrosis.  There was perforation. There was abscess formation.  Estimated Blood Loss:  200 mL         Drains: Pelvic drain - entering in LLQ         Specimens: appendix/ terminal ileum/ abscess cavity         Complications:  None; patient tolerated the procedure well.         Disposition: PACU - hemodynamically stable.         Condition: stable  Sarah Brock. Georgette Dover, MD, [facility]  General/ Trauma Surgery  01/15/2018 4:35 PM

## 2018-01-15 NOTE — Anesthesia Procedure Notes (Signed)
Procedure Name: Intubation Date/Time: 01/15/2018 1:51 PM Performed by: Lowella Dell, CRNA Pre-anesthesia Checklist: Patient identified, Emergency Drugs available, Suction available and Patient being monitored Patient Re-evaluated:Patient Re-evaluated prior to induction Oxygen Delivery Method: Circle System Utilized Preoxygenation: Pre-oxygenation with 100% oxygen Induction Type: IV induction Ventilation: Mask ventilation without difficulty and Oral airway inserted - appropriate to patient size Laryngoscope Size: Mac and 3 Grade View: Grade I Tube type: Oral Number of attempts: 1 Airway Equipment and Method: Stylet Placement Confirmation: ETT inserted through vocal cords under direct vision,  positive ETCO2 and breath sounds checked- equal and bilateral Secured at: 21 cm Tube secured with: Tape Dental Injury: Teeth and Oropharynx as per pre-operative assessment

## 2018-01-15 NOTE — Progress Notes (Signed)
Pharmacy Antibiotic Note  Sarah Brock is a 62 y.o. female admitted on 01/15/2018 with Intra-abdominal abscess.  Pharmacy has been consulted for zosyn dosing.  Plan: -Zosyn 3.375g IV Q 8 hrs (4 hr infusion)  Height: 5\' 2"  (157.5 cm) Weight: 161 lb (73 kg) IBW/kg (Calculated) : 50.1  Temp (24hrs), Avg:97.8 F (36.6 C), Min:97.4 F (36.3 C), Max:98.3 F (36.8 C)  Recent Labs  Lab 01/15/18 1059  WBC 7.2  CREATININE 0.82    Estimated Creatinine Clearance: 66.6 mL/min (by C-G formula based on SCr of 0.82 mg/dL).    Allergies  Allergen Reactions  . Simvastatin Other (See Comments)    Stomach pain, low back pain   . Shrimp [Shellfish Allergy] Other (See Comments)    Swelling and hypersensitivity    Antimicrobials this admission: cefotetan 7/24 x 1 Zosyn 7/24 >>   Microbiology results:  Thank you for allowing pharmacy to be a part of this patient's care.  Maryanna Shape, PharmD, BCPS, BCPPS Clinical Pharmacist  Pager: [phone]   01/15/2018 7:39 PM

## 2018-01-15 NOTE — Interval H&P Note (Signed)
History and Physical Interval Note:  01/15/2018 12:18 PM  Sarah Brock  has presented today for surgery, with the diagnosis of PERFORATED APPENDICITIS  The various methods of treatment have been discussed with the patient and family. After consideration of risks, benefits and other options for treatment, the patient has consented to  Procedure(s): LAPAROSCOPIC APPENDECTOMY, POSSIBLE OPEN APPENDECTOMY (N/A) as a surgical intervention .  The patient's history has been reviewed, patient examined, no change in status, stable for surgery.  I have reviewed the patient's chart and labs.  Questions were answered to the patient's satisfaction.     Maia Petties

## 2018-01-15 NOTE — Transfer of Care (Signed)
Immediate Anesthesia Transfer of Care Note  Patient: Sarah Brock  Procedure(s) Performed: LAPAROSCOPIC APPENDECTOMY CONVERTED TO OPEN APPENDECTOMY SMALL BOWEL RESECTION (N/A Abdomen)  Patient Location: PACU  Anesthesia Type:General  Level of Consciousness: awake and alert   Airway & Oxygen Therapy: Patient Spontanous Breathing and Patient connected to face mask oxygen  Post-op Assessment: Report given to RN and Post -op Vital signs reviewed and stable  Post vital signs: Reviewed and stable  Last Vitals:  Vitals Value Taken Time  BP 153/116 01/15/2018  4:24 PM  Temp    Pulse 88 01/15/2018  4:26 PM  Resp 16 01/15/2018  4:26 PM  SpO2 98 % 01/15/2018  4:26 PM  Vitals shown include unvalidated device data.  Last Pain:  Vitals:   01/15/18 1010  TempSrc: Oral  PainSc: 0-No pain      Patients Stated Pain Goal: 5 (61/60/73 7106)  Complications: No apparent anesthesia complications

## 2018-01-15 NOTE — Anesthesia Preprocedure Evaluation (Signed)
Anesthesia Evaluation  Patient identified by MRN, date of birth, ID band Patient awake    Reviewed: Allergy & Precautions, NPO status , Patient's Chart, lab work & pertinent test results  Airway Mallampati: II  TM Distance: >3 FB Neck ROM: Full    Dental  (+) Dental Advisory Given, Poor Dentition, Partial Upper   Pulmonary    breath sounds clear to auscultation       Cardiovascular hypertension,  Rhythm:Regular Rate:Normal     Neuro/Psych    GI/Hepatic   Endo/Other  diabetes  Renal/GU      Musculoskeletal   Abdominal   Peds  Hematology   Anesthesia Other Findings   Reproductive/Obstetrics                             Anesthesia Physical Anesthesia Plan  ASA: III  Anesthesia Plan: General   Post-op Pain Management:    Induction: Intravenous  PONV Risk Score and Plan: 1 and Ondansetron and Metaclopromide  Airway Management Planned: Oral ETT  Additional Equipment:   Intra-op Plan:   Post-operative Plan: Extubation in OR  Informed Consent: I have reviewed the patients History and Physical, chart, labs and discussed the procedure including the risks, benefits and alternatives for the proposed anesthesia with the patient or authorized representative who has indicated his/her understanding and acceptance.   Dental advisory given  Plan Discussed with:   Anesthesia Plan Comments:         Anesthesia Quick Evaluation

## 2018-01-16 ENCOUNTER — Encounter (HOSPITAL_COMMUNITY): Payer: Self-pay | Admitting: Surgery

## 2018-01-16 LAB — BASIC METABOLIC PANEL
ANION GAP: 10 (ref 5–15)
ANION GAP: 10 (ref 5–15)
BUN: 7 mg/dL — ABNORMAL LOW (ref 8–23)
BUN: 7 mg/dL — ABNORMAL LOW (ref 8–23)
CALCIUM: 8.3 mg/dL — AB (ref 8.9–10.3)
CALCIUM: 8.1 mg/dL — AB (ref 8.9–10.3)
CO2: 27 mmol/L (ref 22–32)
CO2: 26 mmol/L (ref 22–32)
Chloride: 102 mmol/L (ref 98–111)
Chloride: 102 mmol/L (ref 98–111)
Creatinine, Ser: 0.78 mg/dL (ref 0.44–1.00)
Creatinine, Ser: 0.85 mg/dL (ref 0.44–1.00)
GFR calc Af Amer: >60 mL/min (ref >60)
GFR calc non Af Amer: >60 mL/min (ref >60)
Glucose, Bld: 124 mg/dL — ABNORMAL HIGH (ref 70–99)
Glucose, Bld: 95 mg/dL (ref 70–99)
Potassium: 3.7 mmol/L (ref 3.5–5.1)
Potassium: 3.5 mmol/L (ref 3.5–5.1)
SODIUM: 139 mmol/L (ref 135–145)
SODIUM: 138 mmol/L (ref 135–145)

## 2018-01-16 LAB — CBC
HCT: 32.6 % — ABNORMAL LOW (ref 36.0–46.0)
HEMOGLOBIN: 10 g/dL — AB (ref 12.0–15.0)
MCH: 25 pg — AB (ref 26.0–34.0)
MCHC: 30.7 g/dL (ref 30.0–36.0)
MCV: 81.5 fL (ref 78.0–100.0)
PLATELETS: 470 10*3/uL — AB (ref 150–400)
RBC: 4 MIL/uL (ref 3.87–5.11)
RDW: 15.5 % (ref 11.5–15.5)
WBC: 21.5 10*3/uL — AB (ref 4.0–10.5)

## 2018-01-16 LAB — GLUCOSE, CAPILLARY
GLUCOSE-CAPILLARY: 94 mg/dL (ref 70–99)
GLUCOSE-CAPILLARY: 122 mg/dL — AB (ref 70–99)
GLUCOSE-CAPILLARY: 96 mg/dL (ref 70–99)
Glucose-Capillary: 126 mg/dL — ABNORMAL HIGH (ref 70–99)
Glucose-Capillary: 109 mg/dL — ABNORMAL HIGH (ref 70–99)

## 2018-01-16 MED ORDER — SODIUM CHLORIDE 0.9 % IV SOLN
INTRAVENOUS | Status: DC
  Administered 2018-01-16: 15:00:00 via INTRAVENOUS

## 2018-01-16 NOTE — Progress Notes (Signed)
ANTIBIOTIC CONSULT NOTE   Pharmacy Consult for Zosyn Indication: Open appendectomy/ small bowel resection    Allergies  Allergen Reactions  . Simvastatin Other (See Comments)    Stomach pain, low back pain   . Shrimp [Shellfish Allergy] Other (See Comments)    Swelling and hypersensitivity    Patient Measurements: Height: 5\' 2"  (157.5 cm) Weight: 161 lb (73 kg) IBW/kg (Calculated) : 50.1 Adjusted Body Weight:    Vital Signs: Temp: 98.6 F (37 C) (07/25 1006) Temp Source: Oral (07/25 1006) BP: 167/88 (07/25 1006) Pulse Rate: 90 (07/25 1006) Intake/Output from previous day: 07/24 0701 - 07/25 0700 In: 1150 [I.V.:1150] Out: 1900 [Urine:1500; Drains:200; Blood:200] Intake/Output from this shift: No intake/output data recorded.  Labs: Recent Labs    01/15/18 1059 01/16/18 0338  WBC 7.2 21.5*  HGB 9.5* 10.0*  PLT 472* 470*  CREATININE 0.82 0.78   Estimated Creatinine Clearance: 68.3 mL/min (by C-G formula based on SCr of 0.78 mg/dL). No results for input(s): VANCOTROUGH, VANCOPEAK, VANCORANDOM, GENTTROUGH, GENTPEAK, GENTRANDOM, TOBRATROUGH, TOBRAPEAK, TOBRARND, AMIKACINPEAK, AMIKACINTROU, AMIKACIN in the last 72 hours.   Microbiology:   Medical History: Past Medical History:  Diagnosis Date  . Anemia, unspecified   . Asthma    as a child, no problem as adult, no inhaler  . Diabetes mellitus    Type II  . History of blood transfusion    pre hysterectomy  . Osteoarthrosis, unspecified whether generalized or localized, unspecified site   . Other and unspecified hyperlipidemia   . Other convulsions    last one 2013  . Other malaise and fatigue   . Other specified cardiac dysrhythmias(427.89) 2006   "history of Bradycardia" .  wore a mointor nothing found  . Seizure disorder ([place])   . Unspecified essential hypertension   . Unspecified vitamin D deficiency     Assessment:  Intraabdominal abscess s/p surgery 7/24. Afebrile. WBC 7.2>21.5? No cultures Scr  0.78 with CrCl 65-70  Goal of Therapy:  Eradication of infection   Plan:  Zosyn 3.375g IV q 8 hrs. Pharmacy will sign off. Please reconsult for further dosing assitance.  Sarah Brock, PharmD, BCPS Clinical Staff Pharmacist Pager [phone]  Sarah Brock 01/16/2018,11:04 AM

## 2018-01-16 NOTE — Plan of Care (Signed)
  Problem: Pain Managment: Goal: General experience of comfort will improve Outcome: Progressing   

## 2018-01-16 NOTE — Care Management Note (Signed)
Case Management Note CM cross coverage for 6N [id number] Marvetta Gibbons RN, CM  Patient Details  Name: Sarah Brock MRN: [id number] Date of Birth: 18-Sep-1955  Subjective/Objective:  Pt admitted s/p Open appendectomy/ small bowel resection/ drain/ VAC 01/15/18                   Action/Plan: PTA pt lived at home, independent, anticipate return home, referral received for [facility] needs- per MD note plan to remove wound VAC prior to discharge- spoke with pt at bedside for [facility] needs- list provided to pt for choice- CM will f/u with pt prior to discharge for [facility] agency and make appropriate referral, pt still with NTG today- will follow for transition of care needs  Expected Discharge Date:                  Expected Discharge Plan:  [name]  In-House Referral:     Discharge planning Services  CM Consult  Post Acute Care Choice:  Home Health Choice offered to:  Patient  DME Arranged:    DME Agency:     HH Arranged:  RN [place] Agency:     Status of Service:  In process, will continue to follow  If discussed at Long Length of Stay Meetings, dates discussed:    Discharge Disposition: home/home health   Additional Comments:  [name], RN 01/16/2018, 11:46 AM

## 2018-01-16 NOTE — Progress Notes (Signed)
1 Day Post-Op   Subjective/Chief Complaint: Sore, did not sleep well. Minimal NG output Drain 200 ml serosanguinous   Objective: Vital signs in last 24 hours: Temp:  [97.4 F (36.3 C)-99.2 F (37.3 C)] 98.9 F (37.2 C) (07/25 0526) Pulse Rate:  [84-98] 91 (07/25 0526) Resp:  [13-20] 17 (07/25 0526) BP: (144-207)/(76-116) 160/81 (07/25 0526) SpO2:  [97 %-100 %] 100 % (07/25 0526) Weight:  [73 kg (161 lb)] 73 kg (161 lb) (07/24 1010) Last BM Date: 01/14/18  Intake/Output from previous day: 07/24 0701 - 07/25 0700 In: 1150 [I.V.:1150] Out: 1900 [Urine:1500; Drains:200; Blood:200] Intake/Output this shift: No intake/output data recorded.  General appearance: alert, cooperative and no distress Resp: clear to auscultation bilaterally Cardio: regular rate and rhythm, S1, S2 normal, no murmur, click, rub or gallop GI: quiet, mildly distended; incisional tenderness VAC with good seal; serosanguinous output via VAC and JP drain  Lab Results:  Recent Labs    01/15/18 1059 01/16/18 0338  WBC 7.2 21.5*  HGB 9.5* 10.0*  HCT 31.3* 32.6*  PLT 472* 470*   BMET Recent Labs    01/15/18 1059 01/16/18 0338  NA 139 139  K 3.9 3.7  CL 106 102  CO2 22 27  GLUCOSE 108* 124*  BUN 7* 7*  CREATININE 0.82 0.78  CALCIUM 8.7* 8.3*   PT/INR No results for input(s): LABPROT, INR in the last 72 hours. ABG No results for input(s): PHART, HCO3 in the last 72 hours.  Invalid input(s): PCO2, PO2  Studies/Results: No results found.  Anti-infectives: Anti-infectives (From admission, onward)   Start     Dose/Rate Route Frequency Ordered Stop   01/15/18 2000  piperacillin-tazobactam (ZOSYN) IVPB 3.375 g     3.375 g 12.5 mL/hr over 240 Minutes Intravenous Every 8 hours 01/15/18 1906     01/15/18 1000  cefoTEtan (CEFOTAN) 2 g in sodium chloride 0.9 % 100 mL IVPB     2 g 200 mL/hr over 30 Minutes Intravenous To Short Stay 01/14/18 0835 01/15/18 1425      Assessment/Plan: S/p Open  appendectomy/ small bowel resection/ drain/ VAC 01/15/18 Awaiting return of bowel function  Continue NG tube Encourage ambulation Change VAC tomorrow - will switch to wet to dry dressings prior to discharge Probable hospitalization until Monday or Tuesday.   LOS: 1 day    Maia Petties 01/16/2018

## 2018-01-17 LAB — GLUCOSE, CAPILLARY
GLUCOSE-CAPILLARY: 79 mg/dL (ref 70–99)
GLUCOSE-CAPILLARY: 86 mg/dL (ref 70–99)
Glucose-Capillary: 104 mg/dL — ABNORMAL HIGH (ref 70–99)
Glucose-Capillary: 111 mg/dL — ABNORMAL HIGH (ref 70–99)
Glucose-Capillary: 115 mg/dL — ABNORMAL HIGH (ref 70–99)
Glucose-Capillary: 81 mg/dL (ref 70–99)

## 2018-01-17 LAB — CBC
HEMATOCRIT: 28.1 % — AB (ref 36.0–46.0)
Hemoglobin: 8.7 g/dL — ABNORMAL LOW (ref 12.0–15.0)
MCH: 24.7 pg — ABNORMAL LOW (ref 26.0–34.0)
MCHC: 31 g/dL (ref 30.0–36.0)
MCV: 79.8 fL (ref 78.0–100.0)
PLATELETS: 391 10*3/uL (ref 150–400)
RBC: 3.52 MIL/uL — ABNORMAL LOW (ref 3.87–5.11)
RDW: 15.8 % — AB (ref 11.5–15.5)
WBC: 13.9 10*3/uL — ABNORMAL HIGH (ref 4.0–10.5)

## 2018-01-17 NOTE — Progress Notes (Signed)
Spoke to patient about [facility] choice. Currently she states she does not want HH if she can avoid it. She wants to focus her attention on healing and will choose at DC if needed. She currently has NGT and plans for DC early next week.

## 2018-01-17 NOTE — Consult Note (Signed)
[place] Nurse wound consult note Reason for Consult: NWPT VAC first post op dressing change  Wound type: surgical  Pressure Injury POA: NA Measurement: 9cm x 4cm x 4cm  Wound DPB:AQVOH, pink, bleeding at wound edges, subcutaneous tissue Drainage (amount, consistency, odor) minimal in VAC canister  Periwound: intact  Dressing procedure/placement/frequency: Removed old NPWT dressing Filled wound with _ _1_____ piece of black foam. Sealed NPWT dressing at 156mm HG/145mmHG  Patient received IV pain medication per bedside nurse prior to dressing change Patient tolerated procedure well  [place] for bedside nurse to change non-complex NPWT dressing M/W/F   Discussed POC with patient and bedside nurse.  Re consult if needed, will not follow at this time. Thanks  Laurine Kuyper [employer], RN,CWOCN, CNS, [place] [phone])

## 2018-01-17 NOTE — Consult Note (Signed)
   [facility] CM Inpatient Consult   01/17/2018  Sarah Brock 1956-05-11 [id number]     Came by to visit Sarah Brock on behalf of [employer] for [employer] employees/dependents with [facility] insurance.  Sarah Brock was resting. Therefore, spoke briefly to make her aware that she will receive a post hospital discharge call from [place].  Provided 24-nurse advice line magnet and contact information.   Expressed appreciation of visit.  Marthenia Rolling, MSN-Ed, RN,BSN [facility] Liaison [phone]

## 2018-01-17 NOTE — Progress Notes (Signed)
2 Days Post-Op   Subjective/Chief Complaint: No flatus, but feels a lot of "rumbling" in her abdomen Pain controlled No nausea or vomiting Minimal NG output Drains serosanguinous   Objective: Vital signs in last 24 hours: Temp:  [98.2 F (36.8 C)-98.6 F (37 C)] 98.2 F (36.8 C) (07/25 2024) Pulse Rate:  [90-102] 94 (07/25 2024) Resp:  [16-20] 20 (07/25 2024) BP: (148-167)/(77-92) 148/77 (07/25 2024) SpO2:  [99 %-100 %] 99 % (07/25 2024) Last BM Date: 01/14/18  Intake/Output from previous day: 07/25 0701 - 07/26 0700 In: 812.8 [I.V.:700.1; IV Piggyback:112.7] Out: 310 [Emesis/NG output:200; Drains:110] Intake/Output this shift: No intake/output data recorded.  General appearance: alert, cooperative and no distress GI: soft, incisional tenderness Wound VAC with good seal; drain serosanguinous; incisions clean and dry  Lab Results:  Recent Labs    01/16/18 0338 01/17/18 0016  WBC 21.5* 13.9*  HGB 10.0* 8.7*  HCT 32.6* 28.1*  PLT 470* 391   BMET Recent Labs    01/16/18 0338 01/16/18 1013  NA 139 138  K 3.7 3.5  CL 102 102  CO2 27 26  GLUCOSE 124* 95  BUN 7* 7*  CREATININE 0.78 0.85  CALCIUM 8.3* 8.1*   PT/INR No results for input(s): LABPROT, INR in the last 72 hours. ABG No results for input(s): PHART, HCO3 in the last 72 hours.  Invalid input(s): PCO2, PO2  Studies/Results: No results found.  Anti-infectives: Anti-infectives (From admission, onward)   Start     Dose/Rate Route Frequency Ordered Stop   01/15/18 2000  piperacillin-tazobactam (ZOSYN) IVPB 3.375 g     3.375 g 12.5 mL/hr over 240 Minutes Intravenous Every 8 hours 01/15/18 1906     01/15/18 1000  cefoTEtan (CEFOTAN) 2 g in sodium chloride 0.9 % 100 mL IVPB     2 g 200 mL/hr over 30 Minutes Intravenous To Short Stay 01/14/18 0835 01/15/18 1425      Assessment/Plan: S/p Open appendectomy/ small bowel resection/ drain/ VAC 01/15/18 Awaiting return of bowel function - beginning to  hear bowel sounds  Clamp NG tube Encourage ambulation with abdominal binder Change VAC today - will switch to wet to dry dressings prior to discharge Probable hospitalization until Monday or Tuesday. HHN arranged by CM    LOS: 2 days    Maia Petties 01/17/2018

## 2018-01-18 LAB — GLUCOSE, CAPILLARY
Glucose-Capillary: 73 mg/dL (ref 70–99)
Glucose-Capillary: 75 mg/dL (ref 70–99)
Glucose-Capillary: 83 mg/dL (ref 70–99)
Glucose-Capillary: 86 mg/dL (ref 70–99)
Glucose-Capillary: 88 mg/dL (ref 70–99)
Glucose-Capillary: 91 mg/dL (ref 70–99)

## 2018-01-18 MED ORDER — OXYCODONE HCL 5 MG PO TABS: 5.0000 mg | ORAL_TABLET | Freq: Four times a day (QID) | ORAL | 0 refills | Status: DC | PRN

## 2018-01-18 MED ORDER — AMOXICILLIN-POT CLAVULANATE 875-125 MG PO TABS: 1.0000 | ORAL_TABLET | Freq: Two times a day (BID) | ORAL | 0 refills | Status: AC

## 2018-01-18 NOTE — Progress Notes (Signed)
3 Days Post-Op   Subjective/Chief Complaint: Patient had several episodes of flatus Has remained clamped x 24 hours Minimal pain - likes her abdominal binder.  Using Toradol q6 with minimal additional morphine VAC change yesterday   Objective: Vital signs in last 24 hours: Temp:  [98.4 F (36.9 C)-98.9 F (37.2 C)] 98.4 F (36.9 C) (07/27 0421) Pulse Rate:  [91-99] 91 (07/27 0421) Resp:  [18] 18 (07/27 0421) BP: (151-174)/(79-94) 172/94 (07/27 0421) SpO2:  [96 %-98 %] 98 % (07/27 0421) Last BM Date: 01/14/18  Intake/Output from previous day: 07/26 0701 - 07/27 0700 In: 1213.3 [I.V.:1133.3; IV Piggyback:50] Out: 505 [Urine:800; Drains:75] Intake/Output this shift: No intake/output data recorded.  General appearance: alert, cooperative and no distress Resp: clear to auscultation bilaterally Cardio: regular rate and rhythm, S1, S2 normal, no murmur, click, rub or gallop GI: soft, minimal distention, + BS Incision - VAC with good seal; RLQ drain site no drainage; LUQ port site c/d/i LLQ JP drain - serous drainage Lab Results:  Recent Labs    01/16/18 0338 01/17/18 0016  WBC 21.5* 13.9*  HGB 10.0* 8.7*  HCT 32.6* 28.1*  PLT 470* 391   BMET Recent Labs    01/16/18 0338 01/16/18 1013  NA 139 138  K 3.7 3.5  CL 102 102  CO2 27 26  GLUCOSE 124* 95  BUN 7* 7*  CREATININE 0.78 0.85  CALCIUM 8.3* 8.1*   PT/INR No results for input(s): LABPROT, INR in the last 72 hours. ABG No results for input(s): PHART, HCO3 in the last 72 hours.  Invalid input(s): PCO2, PO2  Studies/Results: No results found.  Anti-infectives: Anti-infectives (From admission, onward)   Start     Dose/Rate Route Frequency Ordered Stop   01/15/18 2000  piperacillin-tazobactam (ZOSYN) IVPB 3.375 g     3.375 g 12.5 mL/hr over 240 Minutes Intravenous Every 8 hours 01/15/18 1906     01/15/18 1000  cefoTEtan (CEFOTAN) 2 g in sodium chloride 0.9 % 100 mL IVPB     2 g 200 mL/hr over 30 Minutes  Intravenous To Short Stay 01/14/18 0835 01/15/18 1425      Assessment/Plan: S/p Open appendectomy/ small bowel resection/ drain/ VAC 01/15/18 Bowel function returning - flatus D/C NG tube - begin clear today Encourage ambulation with abdominal binder Changed VAC Friday - will switch to wet to dry dressings prior to discharge Will likely remove drain prior to discharge Will change abx to PO Augmentin on discharge for 1 more week Probable hospitalization until Monday HHN arranged by CM - finalized arrangements on Monday prior to discharge    LOS: 3 days    Sarah Brock 01/18/2018

## 2018-01-19 LAB — GLUCOSE, CAPILLARY
Glucose-Capillary: 129 mg/dL — ABNORMAL HIGH (ref 70–99)
Glucose-Capillary: 163 mg/dL — ABNORMAL HIGH (ref 70–99)
Glucose-Capillary: 88 mg/dL (ref 70–99)
Glucose-Capillary: 92 mg/dL (ref 70–99)
Glucose-Capillary: 92 mg/dL (ref 70–99)
Glucose-Capillary: 99 mg/dL (ref 70–99)

## 2018-01-19 NOTE — Progress Notes (Signed)
Patient had bloody stool x 3 Md notified. Per Dr. Hassell Done will order labs in am and continue to monitor.

## 2018-01-19 NOTE — Progress Notes (Signed)
Patient ID: Sarah Brock, female   DOB: 29-Feb-1956, 62 y.o.   MRN: [id number] [facility] Progress Note:   4 Days Post-Op  Subjective: Mental status is clear;  She has been up walking and is taking full liquid breakfast Objective: Vital signs in last 24 hours: Temp:  [97.7 F (36.5 C)-98.9 F (37.2 C)] 98.9 F (37.2 C) (07/28 0456) Pulse Rate:  [88-97] 92 (07/28 0456) Resp:  [16-17] 16 (07/28 0456) BP: (173-196)/(92-102) 173/92 (07/28 0456) SpO2:  [97 %-100 %] 97 % (07/28 0456)  Intake/Output from previous day: 07/27 0701 - 07/28 0700 In: 4241.1 [P.O.:750; I.V.:3341.1; IV Piggyback:150] Out: 2390 [Urine:2150; Drains:240] Intake/Output this shift: Total I/O In: -  Out: 400 [Urine:400]  Physical Exam: Work of breathing is not labored;  Abdominal VAC in place.    Lab Results:  Results for orders placed or performed during the hospital encounter of 01/15/18 (from the past 48 hour(s))  Glucose, capillary     Status: Abnormal   Collection Time: 01/17/18 12:28 PM  Result Value Ref Range   Glucose-Capillary 115 (H) 70 - 99 mg/dL  Glucose, capillary     Status: None   Collection Time: 01/17/18  4:34 PM  Result Value Ref Range   Glucose-Capillary 79 70 - 99 mg/dL  Glucose, capillary     Status: None   Collection Time: 01/17/18  7:47 PM  Result Value Ref Range   Glucose-Capillary 86 70 - 99 mg/dL  Glucose, capillary     Status: None   Collection Time: 01/18/18 12:13 AM  Result Value Ref Range   Glucose-Capillary 91 70 - 99 mg/dL  Glucose, capillary     Status: None   Collection Time: 01/18/18  4:23 AM  Result Value Ref Range   Glucose-Capillary 83 70 - 99 mg/dL  Glucose, capillary     Status: None   Collection Time: 01/18/18  8:00 AM  Result Value Ref Range   Glucose-Capillary 73 70 - 99 mg/dL   Comment 1 Notify RN   Glucose, capillary     Status: None   Collection Time: 01/18/18 11:53 AM  Result Value Ref Range   Glucose-Capillary 75 70 - 99 mg/dL   Comment 1 Notify RN   Glucose, capillary     Status: None   Collection Time: 01/18/18  4:28 PM  Result Value Ref Range   Glucose-Capillary 88 70 - 99 mg/dL   Comment 1 Notify RN   Glucose, capillary     Status: None   Collection Time: 01/18/18  9:42 PM  Result Value Ref Range   Glucose-Capillary 86 70 - 99 mg/dL  Glucose, capillary     Status: None   Collection Time: 01/19/18 12:03 AM  Result Value Ref Range   Glucose-Capillary 88 70 - 99 mg/dL  Glucose, capillary     Status: None   Collection Time: 01/19/18  4:54 AM  Result Value Ref Range   Glucose-Capillary 92 70 - 99 mg/dL  Glucose, capillary     Status: None   Collection Time: 01/19/18  8:05 AM  Result Value Ref Range   Glucose-Capillary 99 70 - 99 mg/dL   Comment 1 Notify RN     Radiology/Results: No results found.  Anti-infectives: Anti-infectives (From admission, onward)   Start     Dose/Rate Route Frequency Ordered Stop   01/18/18 0000  amoxicillin-clavulanate (AUGMENTIN) 875-125 MG tablet     1 tablet Oral 2 times daily 01/18/18 0744 01/25/18 2359   01/15/18 2000  piperacillin-tazobactam (ZOSYN) IVPB 3.375 g     3.375 g 12.5 mL/hr over 240 Minutes Intravenous Every 8 hours 01/15/18 1906     01/15/18 1000  cefoTEtan (CEFOTAN) 2 g in sodium chloride 0.9 % 100 mL IVPB     2 g 200 mL/hr over 30 Minutes Intravenous To Short Stay 01/14/18 0835 01/15/18 1425      Assessment/Plan: Problem List: Patient Active Problem List   Diagnosis Date Noted  . Elevated liver enzymes   . Perforated appendicitis   . Asthma 12/17/2017  . AKI (acute kidney injury) ([place]) 12/17/2017  . Leukocytosis 12/17/2017  . Elevated alkaline phosphatase level 12/17/2017  . Hyperlipidemia 12/17/2017  . Acute kidney failure ([place]) 12/17/2017  . Anemia, iron deficiency 05/19/2014  . Essential hypertension 05/19/2014  . Annual physical exam 05/19/2014  . Unspecified constipation 04/15/2013  . Diabetes mellitus type 2, controlled, without  complications ([place]) 02/05/4817  . Seizure disorder ([place])   . Anemia, unspecified     Progressing-will offer solids later today.   4 Days Post-Op    LOS: 4 days   Matt B. Hassell Done, MD, [facility] [phone] beeper [phone]  01/19/2018 9:06 AM

## 2018-01-19 NOTE — Plan of Care (Signed)
  Problem: Clinical Measurements: Goal: Ability to maintain clinical measurements within normal limits will improve Outcome: Progressing   Problem: Nutrition: Goal: Adequate nutrition will be maintained Outcome: Progressing   Problem: Pain Managment: Goal: General experience of comfort will improve Outcome: Progressing   

## 2018-01-20 LAB — CBC
HCT: 24.8 % — ABNORMAL LOW (ref 36.0–46.0)
Hemoglobin: 7.5 g/dL — ABNORMAL LOW (ref 12.0–15.0)
MCH: 24.5 pg — AB (ref 26.0–34.0)
MCHC: 30.2 g/dL (ref 30.0–36.0)
MCV: 81 fL (ref 78.0–100.0)
PLATELETS: 345 10*3/uL (ref 150–400)
RBC: 3.06 MIL/uL — AB (ref 3.87–5.11)
RDW: 15.6 % — AB (ref 11.5–15.5)
WBC: 9.1 10*3/uL (ref 4.0–10.5)

## 2018-01-20 LAB — GLUCOSE, CAPILLARY
GLUCOSE-CAPILLARY: 101 mg/dL — AB (ref 70–99)
Glucose-Capillary: 75 mg/dL (ref 70–99)
Glucose-Capillary: 81 mg/dL (ref 70–99)
Glucose-Capillary: 87 mg/dL (ref 70–99)

## 2018-01-20 MED FILL — AMOX-CLAV 875-125 MG TABLET: 875-125 | 7 days supply | Qty: 14 | Fill #0

## 2018-01-20 MED FILL — oxyCODONE HCL 5 MG TABS: 5 | 5 days supply | Qty: 20 | Fill #0

## 2018-01-20 NOTE — Progress Notes (Signed)
Pt hemoglobin 7.5. Dr. Ninfa Linden made aware no new order received.

## 2018-01-20 NOTE — Discharge Instructions (Signed)
[facility], [place] [id number]  OPEN ABDOMINAL SURGERY: POST OP INSTRUCTIONS  Always review your discharge instruction sheet given to you by the facility where your surgery was performed.  IF YOU HAVE DISABILITY OR FAMILY LEAVE FORMS, YOU MUST BRING THEM TO THE OFFICE FOR PROCESSING.  PLEASE DO NOT GIVE THEM TO YOUR DOCTOR.  1. A prescription for pain medication may be given to you upon discharge.  Take your pain medication as prescribed, if needed.  If narcotic pain medicine is not needed, then you may take acetaminophen (Tylenol) or ibuprofen (Advil) as needed. 2. Take your usually prescribed medications unless otherwise directed. 3. If you need a refill on your pain medication, please contact your pharmacy. They will contact our office to request authorization.  Prescriptions will not be filled after 5pm or on week-ends. 4. You should follow a light diet the first few days after arrival home, such as soup and crackers, pudding, etc.unless your doctor has advised otherwise. A high-fiber, low fat diet can be resumed as tolerated.   Be sure to include lots of fluids daily. Most patients will experience some swelling and bruising around the incision.  Ice packs will help.  Swelling and bruising can take several days to resolve 5. It is common to experience some constipation if taking pain medication after surgery.  Increasing fluid intake and taking a stool softener will usually help or prevent this problem from occurring.  A mild laxative (Milk of Magnesia or Miralax) should be taken according to package directions if there are no bowel movements after 48 hours. 6.  Your dressing needs to be changed once a day.  Remove the old packing, and gently clean the skin around the wound.  Moisten some 4x4 gauze with sterile saline and gently pack it into the wound.  Cover this with dry gauze and tape.  Repeat this daily. 7. ACTIVITIES:  You may resume regular (light) daily activities  beginning the next day--such as daily self-care, walking, climbing stairs--gradually increasing activities as tolerated.  You may have sexual intercourse when it is comfortable.  Refrain from any heavy lifting or straining until approved by your doctor. a. You may drive when you no longer are taking prescription pain medication, you can comfortably wear a seatbelt, and you can safely maneuver your car and apply brakes b. Return to Work: ___________________________________ 8. You should see your doctor in the office for a follow-up appointment approximately two weeks after your surgery.  Make sure that you call for this appointment within a day or two after you arrive home to insure a convenient appointment time. OTHER INSTRUCTIONS:  _____________________________________________________________ _____________________________________________________________  WHEN TO CALL YOUR DOCTOR: 1. Fever over 101.0 2. Inability to urinate 3. Nausea and/or vomiting 4. Extreme swelling or bruising 5. Continued bleeding from incision. 6. Increased pain, redness, or drainage from the incision. 7. Difficulty swallowing or breathing 8. Muscle cramping or spasms. 9. Numbness or tingling in hands or feet or around lips.  The clinic staff is available to answer your questions during regular business hours.  Please dont hesitate to call and ask to speak to one of the nurses if you have concerns.  For further questions, please visit [email or web address]

## 2018-01-20 NOTE — Discharge Summary (Signed)
Physician Discharge Summary  Patient ID: Sarah Brock MRN: [id number] DOB/AGE: 11/23/1955 62 y.o.  Admit date: 01/15/2018 Discharge date: 01/20/2018  Admission Diagnoses:  Discharge Diagnoses:  Active Problems:   Perforated appendicitis   Discharged Condition: good  Hospital Course: admitted for elective appendectomy.  Taken to regular floor post op.  Wound VAC care started.  Patient had an uneventful recovery.  Diet slowly advanced.  At day of discharge, patient was tolerating a diet, having BM's, and was comfortable on oral pain meds.  VAC was d/ced and drain was removed.  Patient discharged home  Consults: None  Significant Diagnostic Studies:   Treatments: surgery: lap converted to open appendectomy with small bowel resection.  Discharge Exam: Blood pressure (!) 169/91, pulse 77, temperature 98.6 F (37 C), temperature source Oral, resp. rate 16, height 5\' 2"  (1.575 m), weight 73 kg (161 lb), SpO2 96 %. General appearance: alert, cooperative and no distress Resp: clear to auscultation bilaterally Cardio: regular rate and rhythm, S1, S2 normal, no murmur, click, rub or gallop Incision/Wound:abdomen soft, drain serosang prior to removal, wound clean  Disposition: Discharge disposition: 01-Home or Self Care       Discharge Instructions    Call MD for:  persistant nausea and vomiting   Complete by:  As directed    Call MD for:  redness, tenderness, or signs of infection (pain, swelling, redness, odor or green/yellow discharge around incision site)   Complete by:  As directed    Call MD for:  severe uncontrolled pain   Complete by:  As directed    Call MD for:  temperature >100.4   Complete by:  As directed    Diet general   Complete by:  As directed    Discharge wound care:   Complete by:  As directed    Daily wet to dry dressings with 4x4 gauze and saline   Driving Restrictions   Complete by:  As directed    Do not drive while taking pain medications   Increase activity slowly   Complete by:  As directed    Wear your abdominal binder for comfort     Allergies as of 01/20/2018      Reactions   Simvastatin Other (See Comments)   Stomach pain, low back pain    Shrimp [shellfish Allergy] Other (See Comments)   Swelling and hypersensitivity      Medication List    STOP taking these medications   HYDROcodone-acetaminophen 5-325 MG tablet Commonly known as:  NORCO/VICODIN     TAKE these medications   amoxicillin-clavulanate 875-125 MG tablet Commonly known as:  AUGMENTIN Take 1 tablet by mouth 2 (two) times daily for 7 days.   cetirizine 10 MG tablet Commonly known as:  ZYRTEC Take 10 mg by mouth at bedtime.   ezetimibe 10 MG tablet Commonly known as:  ZETIA Take 1 tablet (10 mg total) by mouth daily.   ferrous sulfate 325 (65 FE) MG tablet Take 1 tablet (325 mg total) by mouth 2 (two) times daily with a meal.   glucose blood test strip Use as instructed   levETIRAcetam 500 MG tablet Commonly known as:  KEPPRA TAKE ONE TABLET BY MOUTH EVERY 12 HOURS FOR SEIZURES   lisinopril 30 MG tablet Commonly known as:  PRINIVIL,ZESTRIL TAKE 1 TABLET BY MOUTH ONCE DAILY FOR BLOOD PRESSURE   metFORMIN 1000 MG tablet Commonly known as:  GLUCOPHAGE TAKE ONE TABLET BY MOUTH TWICE DAILY WITH MEALS TO CONTROL BLOOD SUGAR  ondansetron 4 MG tablet Commonly known as:  ZOFRAN Take 4 mg by mouth every 8 (eight) hours as needed for nausea or vomiting.   oxyCODONE 5 MG immediate release tablet Commonly known as:  Oxy IR/ROXICODONE Take 1 tablet (5 mg total) by mouth every 6 (six) hours as needed for severe pain.   VSL#3 Caps Take 2 capsules by mouth daily.            Discharge Care Instructions  (From admission, onward)        Start     Ordered   01/18/18 0000  Discharge wound care:    Comments:  Daily wet to dry dressings with 4x4 gauze and saline   01/18/18 0744     Follow-up Information    Donnie Mesa, MD.  Schedule an appointment as soon as possible for a visit in 2 week(s).   Specialty:  General Surgery Contact information: [address] [place] [id number] [phone]           Signed: Harl Bowie 01/20/2018, 6:52 AM

## 2018-01-20 NOTE — Care Management Note (Signed)
Case Management Note  Patient Details  Name: Sarah Brock MRN: [id number] Date of Birth: 20-Apr-1956  Subjective/Objective:                    Action/Plan:  Discussed home health RN with patient and plan for wet to dry dressing changes daily at home.  Patient currently declining.  Expected Discharge Date:  01/20/18               Expected Discharge Plan:  Home/Self Care  In-House Referral:     Discharge planning Services  CM Consult  Post Acute Care Choice:  Home Health Choice offered to:  Patient  DME Arranged:  N/A DME Agency:  NA  HH Arranged:  Patient Refused [place] Agency:  NA  Status of Service:  Completed, signed off  If discussed at [place] of Stay Meetings, dates discussed:    Additional Comments:  Marilu Favre, RN 01/20/2018, 8:20 AM

## 2018-01-20 NOTE — Progress Notes (Signed)
Patient ID: Sarah Brock, female   DOB: 11-02-1955, 62 y.o.   MRN: [id number]   Doing well Having BM's Tolerating po  Abdomen soft, drain serosang  Plan: D/c drain D/c VAC and change to wet to dry dressings D/c home today

## 2018-01-20 NOTE — Progress Notes (Signed)
Removed wound vac per Md order patient tolerated well, no drainage noted. Apply wet to dry dressing. Supplies sent with pt. Educated patient on  dressing change verbally understands.

## 2018-01-22 ENCOUNTER — Encounter: Payer: Self-pay | Admitting: *Deleted

## 2018-01-22 ENCOUNTER — Other Ambulatory Visit: Payer: Self-pay | Admitting: *Deleted

## 2018-01-22 NOTE — Patient Outreach (Signed)
Sarah Brock) Care Management  01/22/2018  RIOT BARRICK 1955/10/22 [id number]   Subjective: Telephone call to patient's home / mobile number, spoke with patient, and HIPAA verified.  Discussed [facility] Care Management [facility] Transition of care follow up, patient voiced understanding, and is in agreement to follow up.    Patient states is doing okay, having slight nausea after taking antibiotics,and is planning to take Zofran if needed.  States she is planning to call surgeon's office to schedule follow up appointment, ask questions regarding wound care, showering with wound, and request refill for Zofran if needed.  Patient states she is able to manage self care and has assistance as needed with activities of daily living / home management.  Patient voices understanding of medical diagnosis, surgery, and treatment plan. States he is accessing the following [facility] benefits: outpatient pharmacy, hospital indemnity (not chosen), and has family medical leave act [employer]) in place. Patient states she does not have any education material, transition of care, care coordination, disease management, disease monitoring, transportation, community resource, or pharmacy needs at this time.  States she is very appreciative of the follow up and is in agreement to receive [place] Management information.     Objective:Per KPN (Knowledge Performance Now, point of care tool) and chart review, patient hospitalized  01/15/18 -01/20/18 for Perforated appendicitis with pelvic abscess with involved small bowel.    Status post Laparoscopic appendectomy converted to exploratory laparotomy, open appendectomy, small bowel resection, placement of pelvic drain, placement of wound VAC (2 x 8 cm) on 01/15/18.  Patient hospitalized 08/19/17 -12/22/17 for acute kidney injury,perforated appendicular abscess, andElevated alkaline phosphatase level. Patient also has a history of diabetes, seizure disorder, asthma, Hyperlipidemia,  andAcute kidney failure.[facility] Care Management transition of care follow up completed on 12/24/17.       Assessment:Received [facility] Transition of care referral on 01/16/18.Transition of care follow up completed, no care management needs, and will proceed with case closure.     Plan:RNCM will send patient successful outreach letter, [facility] pamphlet, and magnet. RNCM will complete case closure due to follow up completed / no care management needs.      Keelon Zurn H. Annia [place], BSN, [place] [facility] Telephonic CM Phone: [phone] Fax: [phone]

## 2018-01-23 MED FILL — ONDANSETRON ODT 4 MG TABLET: 4 | 4 days supply | Qty: 15 | Fill #0

## 2018-02-04 NOTE — Telephone Encounter (Signed)
Have informed pt that she can call radiology department [facility] per pt, the camera is broken. Pt has verbalized understanding of this information.

## 2018-02-12 ENCOUNTER — Encounter: Payer: Self-pay | Admitting: Internal Medicine

## 2018-02-17 ENCOUNTER — Ambulatory Visit: Admit: 2018-02-17 | Discharge: 2018-02-18 | Payer: PRIVATE HEALTH INSURANCE

## 2018-02-17 ENCOUNTER — Ambulatory Visit: Admit: 2018-02-17 | Discharge: 2018-02-21 | Payer: PRIVATE HEALTH INSURANCE | Attending: Nuclear Radiology

## 2018-02-17 DIAGNOSIS — I441 Atrioventricular block, second degree: Secondary | ICD-10-CM

## 2018-02-17 DIAGNOSIS — I472 Ventricular tachycardia: Secondary | ICD-10-CM

## 2018-02-17 LAB — POCT GLUCOSE: Glucose, iSTAT: 83 mg/dL (ref 70–199)

## 2018-03-06 MED FILL — metFORMIN HCL 1000 MG TABS: 1000 | 90 days supply | Qty: 180 | Fill #1

## 2018-03-28 MED FILL — levETIRAcetam 500 MG TABS: 500 | 90 days supply | Qty: 180 | Fill #1

## 2018-04-24 MED FILL — LISINOPRIL 30 MG TABS: 30 | 90 days supply | Qty: 90 | Fill #1

## 2018-05-13 ENCOUNTER — Ambulatory Visit: Admit: 2018-05-13 | Discharge: 2018-05-13 | Payer: PRIVATE HEALTH INSURANCE

## 2018-05-13 DIAGNOSIS — Z950 Presence of cardiac pacemaker: Secondary | ICD-10-CM

## 2018-05-13 DIAGNOSIS — I441 Atrioventricular block, second degree: Secondary | ICD-10-CM

## 2018-05-13 DIAGNOSIS — I4589 Other specified conduction disorders: Secondary | ICD-10-CM

## 2018-05-13 DIAGNOSIS — I480 Paroxysmal atrial fibrillation: Secondary | ICD-10-CM

## 2018-05-13 DIAGNOSIS — I472 Ventricular tachycardia: Secondary | ICD-10-CM

## 2018-05-13 NOTE — Progress Notes (Signed)
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    Debbie Harper is a 62 y.o. woman with history of thyroid nodule, IBS, and hysterectomy who presented with exertional fatigue and mild shortness of breath, as well as occasional palpitations that awakened her in the middle of the night.    The exertional symptoms, which started on 08/23/2015 and persisted thereafter, were also sometimes associated with lightheadedness.  She had previously been very active, feeding and riding her horse nearly every day.  Evaluation was notable for a baseline right bundle branch block, and periods of symptomatic 2:1 AV block while reaching peak exercise on a treadmill stress test (see details below).  Holter monitoring also reportedly showed blocked PAC's with resultant bradycardia, though to my review blocked atrial activity was not typically premature (see details below). 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     A dual-chamber device was implanted without complication on 01/25/9936.  Thereafter, she noticed significant improvement in her symptoms, but had one episode very similar to her pre-pacemaker symptoms on 11/18/2015 and several milder episodes afterwards aborted by resting.  Device function was normal -- at follow-up 11/2015, she paced 5% in the RA and 1% in the RV.  She underwent breast augmentation surgery on 04/18/2016.     At her visit in April 2018, she reported having a "wide complex tachycardia" for 5 minutes (up to 115 bpm) after a colonoscopy 06/2016.  She also started having episodes of palpitations in February and  specifically recalled symptomatic episodes on 2/12 and 2/24 -- the latter correlated with a >1 hour episode of atrial fibrillation recorded by the device.  She was started on aspirin 81 mg daily and recommended to take metoprolol 12.5 mg twice daily, though opted not to take it.     At her visit in 03/2017, she reported shortness of breath with exertion (such as walking up stairs) and wondered if there could be a functional issue with her heart -- she actually contacted me prior to the visit to request that an echocardiogram be scheduled the same day.  The echocardiogram showed normal LV function and no hemodynamically significant valvular disease.  Device interrogation showed 62 minutes of atrial fibrillation between April and October 2018, with the longest episode lasting 11:30 minutes on 01/01/2017.  Rate histograms showed a shift to the left, with atrial sensing or pacing in the 60s more than 50% of the time and only rare atrial sensing above 90 bpm.  We agreed to try adding rate-response to her device, and to continue conservative management of the rare atrial fibrillation.   Device check in 06/2017 showed 0.1% atrial high rate detection (10 episodes), with the longest 53 minutes.   Device check in 10/2017 showed 0.1% atrial high rate detection, with the longest episode 35 minutes.  There was also a 20 beat episode of NSVT recorded on 09/17/2017, correlating with an episode of lightheadedness (see my note dated 11/15/2017).  I recommended she try taking metoprolol 12.5 mg twice daily and consider cardiac MRI and/or PET (particularly given the paroxysmal AV block and atrial fibrillation).   Cardiac MRI  in 12/2017 revealed mid-wall and subepicardial late gadolinium enhancement in the lateral wall at the level of the base.  A follow-up PET CT scan showed no abnormal FDG uptake and no resting perfusion defects.   She contacted me 03/19/2018 to report some chest pain "off and on" starting the day before and sent  remote device transmissions.    Today, Debbie Harper is feeling well overall.  She is aware of the episodes of atrial fibrillation, though overall the burden remains low and does not impact her day to day life significantly.  She has had no further episode of lightheadedness like the one in 08/2017 associated with NSVT.  She denies chest discomfort, shortness of breath, orthopnea, paroxysmal nocturnal dyspnea, lower extremity edema, presyncope, or syncope.    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 - Oral    Class: Historical Med    estradiol (ESTRING) 2 mg (7.5 mcg /24 hour) vaginal ring        Sig - Route: Place vaginally every 3 (three) months. Use as instructed - Vaginal    Class: Historical Med    metoprolol tartrate (LOPRESSOR) 25 mg tablet        Sig - Route: Take 12.5 mg by mouth 2 (two) times daily. - Oral    Class: Historical Med          ALLERGIES     Allergies as of 05/14/2018  Review status set to Review Complete by Marisue Humble on 02/17/2018      Severity Noted Reaction Type Reactions    Sulfamethoxazole-trimethoprim Not Specified 10/18/2015    Itching    Insomnia          SOCIAL HISTORY     She reports that she has never smoked. She has never used smokeless tobacco. She reports current alcohol use of about 1.0 standard drinks of alcohol per week. She reports that she does not use drugs.    She is divorced.  She has 3 children.  She lives in [place], [place].  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    She  has 2 horses (a Engineer, water and a [place], ages 11 and 68).  She also wakeboards in the summer.    FAMILY HISTORY     Her father underwent CABG in his late 52's and has cardiomyopathy.  He was a heavy smoker.  He is alive at 12.    Her mother had a heart attack around age 70.  She died after complications from a head injury in June 2016 after being knocked down by a horse.  Her grandfather had colon cancer.    REVIEW OF SYSTEMS     Constitutional:    No fevers, chills, or diaphoresis.  No unexpected weight change.   Skin:  No  rashes.   Eyes:  No swelling, pain, or redness.  No photophobia or visual disturbance.  + reading glasses.  + fuzzy vision sometimes (over the past several years).  HENT:  No neck pain or stiffness.  No hearing loss or tinnitus.  No nosebleeds.   Respiratory:  No shortness of breath at rest.  + dyspnea on exertion   Cardiovascular:  As detailed above.  Gastrointestinal:  + IBS with cramping and gas.  No blood in the stool.  + polyps on colonoscopy in 2015.  Genitourinary:  No difficulty urinating, dysuria, or hematuria.   Neurological:  No vertigo or syncope.  + occasional headaches.  No facial asymmetry or speech difficulty.  + occasional tingling left hand.  No focal weakness.  No seizures.  Hematological:  + easy bruising on backs of hands since 2012.  Musculoskeletal:  No arthralgias or joint swelling.      Psychiatric:  No change in concentration.  No confusion.  No agitation or anxiety.     PHYSICAL EXAMINATION     General: Well-appearing Caucasian female in no apparent distress.  Eyes: Conjunctivae and lids normal.    ENMT:  Ears and nose externally normal.  Hearing intact.  Oropharynx clear.    Respiratory:  Normal respiratory effort.     Cardiovascular:  PMI not-displaced.  Regular rate and rhythm, with no murmurs.    Gastrointestinal:  Soft, non-tender, and non-distended.   Musculoskeletal:  Normal gait and station.  Digits and nails without clubbing, cyanosis, petechiae, or nodes.   Skin:   Left pectoral implant site well-healed, with no evidence of impending erosion, migration, or infection.  No rashes or ecchymoses.  Neurologic:  Strength grossly intact. Sensation grossly intact.  Psychiatric:  Normal judgement and insight.  Normal mood and affect.  Oriented to person, place, and time.    DATA       I reviewed prior 12-lead EKGs:  - 04/02/2017: competing sinus rhythm and atrial pacing at 60 bpm with intact AV conduction and right bundle branch block (QRSd 124 msec).  - 10/02/2016: atrial pacing at 61 bpm with intact AV conduction and right bundle branch block  - 11/29/2015: normal sinus rhythm with right bundle branch block at 64 bpm.  - 10/18/2015: sinus bradycardia at 57 bpm with right bundle branch block  - 08/29/2015: sinus bradycardia at 57 bpm with right bundle branch block.    The Medtronic dual-chamber pacemaker was interrogated today (see separate full report in [facility]).    - She paces 46.1% in the RA (up from 17% in 2018) and 5.7% in the RV (stable).    - Atrial fibrillation burden has been 0.1%, the longest episode lasting 13 minutes on 04/01/2018.    - There was one episode of NSVT recorded 02/07/2018 (5 beats, asymptomatic)      I reviewed the report and tracings from a 24-hour Holter monitor 08/31/2015 [facility]):  - Average HR 64.  Minimum 38.  Maximum 97.  Predominant rhythm sinus.  - Single PVC's occasional; no runs.  - Frequent blocked PACs with resultant bradycardia.  No SVT or atrial fibrillation.    - Occasional shortness of breath during periods of bradycardia with blocked PACs.  - On my review of tracings, several strips appear more consistent with sinus rhythm with 2:1 block than with blocked PAC's based on timing (though P-wave morphology is subtly different with blocked beats).  On tracings with both 2:1 and 1:1 conduction, the P-wave CL remains constant and  ventricular rate exactly 1/2 during 2:1 block.  When transitioning from 1:1 to 2:1 block, the first blocked P-wave  comes slightly early, but possibly due to increase in sinus rate.  - Less frequently (particularly 2:26am), bradycardia appears due to blocked PAC's in bigeminy.  - One period of 3:2 conduction (9:28am) without clear PR prolongation    I reviewed the reports of the following tests:   Cardiac PET 02/17/2018:  1.   Normal perfusion in the left ventricle without myocardial uptake of FDG. Findings are suggestive of no active myocardial inflammatory process or scar.  2.    Normal left ventricular wall motion with a calculated left ventricular ejection fraction of >65% on rest perfusion images.     Cardiac MRI 12/31/2017:  1. Mid wall and subepicardial late gadolinium enhancement in the lateral wall at the level of the base which can be seen with myocarditis or other inflammatory cardiomyopathy. Remaining myocardium demonstrates no evidence of scar.  2. Normal left ventricular function (ejection fraction 73%).     Echocardiogram 04/02/2017:  The patient's blood pressure was 154 mmHg/70 mmHg during the study. The heart rate during the study was 73. Color Flow Doppler was utilized for this exam. Spectral Doppler was utilized for this exam.  1. The left ventricular volume is normal. LV function is hyperdynamic. LV ejection fraction is estimated to be 70 to 75%.  2. The right ventricular volume is normal. Right ventricular function is normal.  3. Left atrial size is normal. Right atrial size is normal.  4. There is no hemodynamically significant valvular disease.  5. There is no Doppler evidence of LV diastolic dysfunction.  6. The RV systolic pressure is estimated to be 23 mmHg based on a right atrial pressure of 3 mmHg.  7. No pericardial effusion noted. The inferior vena cava is less than 21 mm in diameter and collapses with inspiration consistent with a right atrial pressure of 3 mmHg.  8. Aortic root dimension is normal.  - Compared to the previous study on 09/23/15, the prior study is a stress echo.     Exercise stress  echocardiogram 09/23/2015 [facility]):  - Resting EKG sinus rhythm with right bundle branch block   - Resting echocardiogram: Normal LV size, wall thickness, and function, EF 60-65%.  Atria normal size.  RV normal.  Mild anterior leaflet MVP and mild MR.  No other significant valvular pathology.  No pericardial effusion.  - Walked 8 min 11 sec on Bruce, with peak HR 118 bpm early but development of 2:1 AV block at peak exercise with HR in the 60s and associated shortness of breath (2:1 block versus atrial bigeminy with block).  Returned to 1:1 conduction in recovery.  Episodes of supraventricular bigeminy noted.  - I reviewed the tracings:  - At 2:24, there is a period of 3:2 conduction with constant P-P interval and no clear PR prolongation, that appears to persist through 5:50  - HR then drops from 89 to 74 but P-waves difficult to see  - At 6:50 there appears to be 2:1 block with occasional outflow tract PVC's  - At 7:50 there is suspicion for 2:1 conduction with blocked P-waves in prior T-waves.  - At 15 seconds into recovery there are periods with 1:1 conduction at double the rate of final exercise EKGs (5:4 and 6:5 conduction, but with little to no change in PR interval and R-R interval around blocked P-wave exactly double that of conducted intervals).  This apparent Mobitz type  II second degree AV block becomes more apparent as the rate continues to slow in recovery.  - By 2 minutes into recovery there is clear 2:1 AV block that persists to 3 minutes, though with one episode of 3:2 block that appears slightly more consistent with Wenckebach based on the PR intervals and length of pause  - Stress echocardiogram:  Appropriate increase in contractility of all segments; no evidence of ischemia.     Chest CTA 09/22/2015:  - Negative for PE or other explanation for shortness of breath.  - Few liver lesions, probably cysts.       PFTs 09/08/2015:  - FVC, FEV1, FEV1/FVC ratio, and FEF25-75% within normal  limits  - TLC within normal limits  - FRC and RV increased  - With bronchodilators, there is significant response.  - Diffusing capacity high.  - Conclusions: Increased diffusing capacity consistent with left heart failure or shunting.  Although flow rates are within normal limits, the overinflation and response to bronchodilators are characteristic of reactive airways ("minimal")    I reviewed laboratory evaluation from [facility] dated 03/19/2017:  Na 141, K 4.4, Cl 104, CO2 31, BUN 18, Cr 0.7, Glu 86  LFTs normal (alk phos low at 44)  WBC 4.0, Hb 13.8, Plt 146  TC 209, LDL 105, HDL 93, TG 53    ASSESSMENT AND RECOMMENDATIONS     In summary, Debbie Harper is a 62 y.o. 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  She was subsequently diagnosed with paroxysmal atrial fibrillation, as well as occasional NSVT.  Imaging evaluation showed mid-wall and epicardial LGE at the lateral base, but no perfusion defects or evidence of abnormal FDG uptake on subsequent PET scanning.    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  Her ventricular pacing burden is low overall.    - She will continue routine pacemaker checks remotely and in the clinic.    2) Possible sinus node dysfunction:  - She had dyspnea on exertion, and rate histograms showed overall slower sinus rates and increased atrial pacing burden.    - Rate-response was previously enabled to improve chronotropic competence.      3) Atrial fibrillation:  - She had a prior history of relatively infrequent palpitations and has been diagnosed with paroxysmal atrial fibrillation via  device interrogation.  I have previously discussed the pathophysiology of atrial fibrillation and multiple management options with Ms. Brosky. Episodes have remained overall brief.  - Given her associated symptoms, I would recommend that efforts be directed to maintaining sinus rhythm.   - At this point, given the low overall burden, she would like to continue conservative management with the low-dose metoprolol.      4) Stroke prevention:  - Her CHA2DS2-VASc score is 1, for gender only.  This corresponds to a yearly stroke risk of 1.5% with no therapy, 1.2% with aspirin alone, and 0.3-0.5% on therapeutic anticoagulation.  - Given the low burden, I think aspirin alone is reasonable for now.  However, I have previously advised her that my general preference is a more aggressive approach to anticoagulation given the safety of the  newer medications and the potentially devastating effects of stroke -- should she have significantly increasing burden, we may re-visit the potential risks and benefits of NOAC therapy.    5) NSVT:  - She had a symptomatic, 20-beat run of NSVT in 08/2017 associated with significant lightheadedness.  We initiated the metoprolol after that, and she has since had only 1 additional brief asymptomatic (5-beat) run of NSVT.  - Given the combination of AV block, sinus node dysfunction, paroxysmal atrial fibrillation, and NSVT, I suggested further evaluation for inflammatory cardiomyopathy such as sarcoid.  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  The etiology of the LGE is not clear -- perhaps she had a prior episode of myocarditis.    - The combination of the LGE and NSVT is obviously concerning, though she has normal LV function, no sustained arrhythmias, and no syncope.  Since starting low dose metoprolol she has had only the 5-beat asymptomatic run.  I do not  think there is an indication for ICD upgrade at this time.       It is a pleasure participating in Ms. Geving's care.  Please do not hesitate to contact me with any questions or concerns.    Estrellita Ludwig. Manus Rudd, MD, [facility], [facility]  Associate Professor of Clinical Medicine  Division of Cardiology  Cardiac Electrophysiology

## 2018-05-14 DIAGNOSIS — I4891 Unspecified atrial fibrillation: Secondary | ICD-10-CM

## 2018-05-14 DIAGNOSIS — I472 Ventricular tachycardia: Secondary | ICD-10-CM

## 2018-05-16 NOTE — Telephone Encounter (Signed)
Patient seen Dr. Rachelle HoraMoss on 05/12/18 with [employer] rep and is now having more issues with palpitations.  Would like a call back to discuss if the mode had been changed or if there was an increase in energy.    Please call back to consult    Thank you,  Tiffany

## 2018-05-19 NOTE — Telephone Encounter (Signed)
Have informed pt that she only had 44 minutes of A. Fib on 11/21 with an average HR 90s and no other arrhythmia episodes were noted. I have advised pt to take an extra Toprol if she has sustained symptoms of palpitations and if her symptoms persists and/or associated with symptoms of dizziness/CP/SOB to go to ER. Have advised pt to stay well hydrated and minimize ETOH. Pt has verbalized understanding of all information discussed.

## 2018-05-23 ENCOUNTER — Other Ambulatory Visit: Payer: Self-pay | Admitting: Nurse Practitioner

## 2018-05-26 MED FILL — EZETIMIBE 10 MG TABS: 10 | 30 days supply | Qty: 30 | Fill #0

## 2018-06-11 ENCOUNTER — Other Ambulatory Visit: Payer: Self-pay | Admitting: *Deleted

## 2018-06-11 MED ORDER — METFORMIN HCL 1000 MG PO TABS: ORAL_TABLET | ORAL | 0 refills | Status: DC

## 2018-06-11 MED FILL — metFORMIN HCL 1000 MG TABS: 1000 | 90 days supply | Qty: 180 | Fill #0

## 2018-06-11 NOTE — Telephone Encounter (Signed)
[facility] [place] outpatient pharmacy

## 2018-06-20 ENCOUNTER — Encounter: Payer: Self-pay | Admitting: Nurse Practitioner

## 2018-06-20 ENCOUNTER — Ambulatory Visit (INDEPENDENT_AMBULATORY_CARE_PROVIDER_SITE_OTHER): Payer: 59 | Admitting: Nurse Practitioner

## 2018-06-20 VITALS — BP 138/80 | HR 97 | Ht 62.0 in | Wt 160.0 lb

## 2018-06-20 DIAGNOSIS — D509 Iron deficiency anemia, unspecified: Secondary | ICD-10-CM | POA: Diagnosis not present

## 2018-06-20 DIAGNOSIS — I10 Essential (primary) hypertension: Secondary | ICD-10-CM | POA: Diagnosis not present

## 2018-06-20 DIAGNOSIS — E1169 Type 2 diabetes mellitus with other specified complication: Secondary | ICD-10-CM

## 2018-06-20 DIAGNOSIS — E119 Type 2 diabetes mellitus without complications: Secondary | ICD-10-CM

## 2018-06-20 DIAGNOSIS — E785 Hyperlipidemia, unspecified: Secondary | ICD-10-CM | POA: Diagnosis not present

## 2018-06-20 DIAGNOSIS — G40909 Epilepsy, unspecified, not intractable, without status epilepticus: Secondary | ICD-10-CM

## 2018-06-20 NOTE — Progress Notes (Signed)
Careteam: Patient Care Team: Lauree Chandler, NP as PCP - General (Geriatric Medicine)  Advanced Directive information    Allergies  Allergen Reactions  . Simvastatin Other (See Comments)    Stomach pain, low back pain   . Shrimp [Shellfish Allergy] Other (See Comments)    Swelling and hypersensitivity    Chief Complaint  Patient presents with  . Medical Management of Chronic Issues    follow-up     HPI: Patient is a 62 y.o. female seen in the office today for routine follow up.  history of iron deficiency anemia, asthma, DM, HTN, seizure disorder on Keppra  Pt was hospitalized due to perforated appendicitis, had drain but required 2 drains and then had appendectomy.  Had to follow up with sturgeon a few different times still having some tenderness at site.   DM-Does not take blood sugars at home. Continues metformin 1000 twice daily with meals.   AKI- from increase use of aleve, does not take at this time, improved BUN, CR on more recent labs.   Hyperlipidemia- continues on zetia, attempting to eat better.   Seizure disorder- no recurrent seizures. Continues on keppra  Iron def anemia- continues on supplement   Review of Systems:  Review of Systems  Constitutional: Negative for chills, fever and weight loss.  HENT: Negative for tinnitus.   Respiratory: Negative for cough, sputum production and shortness of breath.   Cardiovascular: Negative for chest pain, palpitations and leg swelling.  Gastrointestinal: Negative for abdominal pain, constipation, diarrhea and heartburn.  Genitourinary: Negative for dysuria, frequency and urgency.  Musculoskeletal: Negative for back pain, falls, joint pain and myalgias.  Skin: Negative.   Neurological: Negative for dizziness and headaches.  Psychiatric/Behavioral: Negative for depression and memory loss. The patient does not have insomnia.     Past Medical History:  Diagnosis Date  . Anemia, unspecified   . Asthma    as a child, no problem as adult, no inhaler  . Diabetes mellitus    Type II  . History of blood transfusion    pre hysterectomy  . Osteoarthrosis, unspecified whether generalized or localized, unspecified site   . Other and unspecified hyperlipidemia   . Other convulsions    last one 2013  . Other malaise and fatigue   . Other specified cardiac dysrhythmias(427.89) 2006   "history of Bradycardia" .  wore a mointor nothing found  . Seizure disorder ([place])   . Unspecified essential hypertension   . Unspecified vitamin D deficiency    Past Surgical History:  Procedure Laterality Date  . ABDOMINAL HYSTERECTOMY    . BREAST LUMPECTOMY Right 1997  . BREAST LUMPECTOMY Bilateral    neg for cancer  . CHOLECYSTECTOMY N/A 02/28/2016   Procedure: LAPAROSCOPIC CHOLECYSTECTOMY;  Surgeon: Mickeal Skinner, MD;  Location: [place];  Service: General;  Laterality: N/A;  . IR CATHETER TUBE CHANGE  01/02/2018  . IR RADIOLOGIST EVAL & MGMT  12/25/2017  . IR RADIOLOGIST EVAL & MGMT  01/08/2018  . LAPAROSCOPIC APPENDECTOMY N/A 01/15/2018   Procedure: LAPAROSCOPIC APPENDECTOMY CONVERTED TO OPEN APPENDECTOMY SMALL BOWEL RESECTION;  Surgeon: Donnie Mesa, MD;  Location: [place];  Service: General;  Laterality: N/A;   Social History:   reports that she has never smoked. She has never used smokeless tobacco. She reports that she does not drink alcohol or use drugs.  Family History  Problem Relation Age of Onset  . Cancer Father        Esophageal  Medications: Patient's Medications  New Prescriptions   No medications on file  Previous Medications   CETIRIZINE (ZYRTEC) 10 MG TABLET    Take 10 mg by mouth at bedtime.   EZETIMIBE (ZETIA) 10 MG TABLET    APPOINTMENT OVERDUE, 1 by mouth daily   FERROUS SULFATE 325 (65 FE) MG TABLET    Take 1 tablet (325 mg total) by mouth 2 (two) times daily with a meal.   GLUCOSE BLOOD TEST STRIP    Use as instructed   LEVETIRACETAM (KEPPRA) 500 MG TABLET    TAKE ONE  TABLET BY MOUTH EVERY 12 HOURS FOR SEIZURES   LISINOPRIL (PRINIVIL,ZESTRIL) 30 MG TABLET    TAKE 1 TABLET BY MOUTH ONCE DAILY FOR BLOOD PRESSURE   METFORMIN (GLUCOPHAGE) 1000 MG TABLET    Take one tablet by mouth twice daily with meals to control blood sugar  Modified Medications   No medications on file  Discontinued Medications   ONDANSETRON (ZOFRAN) 4 MG TABLET    Take 4 mg by mouth every 8 (eight) hours as needed for nausea or vomiting.   OXYCODONE (OXY IR/ROXICODONE) 5 MG IMMEDIATE RELEASE TABLET    Take 1 tablet (5 mg total) by mouth every 6 (six) hours as needed for severe pain.   PROBIOTIC PRODUCT (VSL#3) CAPS    Take 2 capsules by mouth daily.     Physical Exam:  Vitals:   06/20/18 1546  BP: 138/80  Pulse: 97  SpO2: 98%  Weight: 160 lb (72.6 kg)  Height: 5\' 2"  (1.575 m)   Body mass index is 29.26 kg/m.  Physical Exam Constitutional:      Appearance: Normal appearance. She is well-developed.  HENT:     Mouth/Throat:     Pharynx: No oropharyngeal exudate.  Eyes:     General: No scleral icterus.    Pupils: Pupils are equal, round, and reactive to light.  Neck:     Musculoskeletal: Neck supple.     Thyroid: No thyromegaly.     Vascular: No carotid bruit.     Trachea: No tracheal deviation.  Cardiovascular:     Rate and Rhythm: Normal rate and regular rhythm.     Heart sounds: Murmur present. No friction rub. No gallop.      Comments: No LE edema b/l. no calf TTP.  Pulmonary:     Effort: Pulmonary effort is normal. No respiratory distress.     Breath sounds: Normal breath sounds. No stridor. No wheezing, rhonchi or rales.  Abdominal:     General: Bowel sounds are normal. There is no distension.     Palpations: Abdomen is soft. Abdomen is not rigid. There is no hepatomegaly or mass.     Tenderness: There is no abdominal tenderness. There is no guarding or rebound.     Hernia: No hernia is present.  Lymphadenopathy:     Cervical: No cervical adenopathy.  Skin:     General: Skin is warm and dry.     Findings: No rash.  Neurological:     Mental Status: She is alert and oriented to person, place, and time.  Psychiatric:        Behavior: Behavior normal.        Thought Content: Thought content normal.        Judgment: Judgment normal.     Labs reviewed: Basic Metabolic Panel: Recent Labs    09/24/17 1603 12/16/17 1600  01/15/18 1059 01/16/18 0338 01/16/18 1013  NA 141 137   < >  139 139 138  K 3.7 4.3   < > 3.9 3.7 3.5  CL 106 103   < > 106 102 102  CO2 27 21   < > 22 27 26   GLUCOSE 99 173*   < > 108* 124* 95  BUN 12 42*   < > 7* 7* 7*  CREATININE 0.85 2.68*   < > 0.82 0.78 0.85  CALCIUM 9.1 8.9   < > 8.7* 8.3* 8.1*  TSH 1.33 1.29  --   --   --   --    < > = values in this interval not displayed.   Liver Function Tests:a Recent Labs    12/19/17 0306 12/20/17 0311 12/22/17 0118  AST 49* 36 42*  ALT 63* 49* 57*  ALKPHOS 224* 187* 193*  BILITOT 0.8 0.7 0.6  PROT 6.6 5.9* 6.3*  ALBUMIN 2.3* 2.1* 2.1*   Recent Labs    12/17/17 1352  LIPASE 25   No results for input(s): AMMONIA in the last 8760 hours. CBC: Recent Labs    09/24/17 1603 12/16/17 1600  12/25/17 1336  01/16/18 0338 01/17/18 0016 01/20/18 0655  WBC 7.0 17.0*   < > 11.3*   < > 21.5* 13.9* 9.1  NEUTROABS 3,675 13,175*  --  8,113*  --   --   --   --   HGB 11.4* 10.7*   < > 10.3*   < > 10.0* 8.7* 7.5*  HCT 34.1* 33.1*   < > 32.1*   < > 32.6* 28.1* 24.8*  MCV 78.4* 78.3*   < > 79.5*   < > 81.5 79.8 81.0  PLT 294 313   < > 543*   < > 470* 391 345   < > = values in this interval not displayed.   Lipid Panel: Recent Labs    09/24/17 1603  CHOL 181  HDL 57  LDLCALC 97  TRIG 162*  CHOLHDL 3.2   TSH: Recent Labs    09/24/17 1603 12/16/17 1600  TSH 1.33 1.29   A1C: Lab Results  Component Value Date   HGBA1C 7.4 (H) 01/15/2018     Assessment/Plan 1. Essential hypertension Stable on lisinipril.  - COMPLETE METABOLIC PANEL WITH GFR  2. Iron  deficiency anemia, unspecified iron deficiency anemia type -continues on iron supplement - CBC with Differential/Platelets  3. Seizure disorder ([place]) No recent seizures, continues on keppra   4. Hyperlipidemia associated with type 2 diabetes mellitus ([facility]) -LDL 97, conts on zetia with dietary modifications - COMPLETE METABOLIC PANEL WITH GFR  5. Controlled type 2 diabetes mellitus without complication, without long-term current use of insulin ([place]) -continues on metformin 1000 mg BID - COMPLETE METABOLIC PANEL WITH GFR - Hemoglobin A1c  Next appt: 4 months Jessica K. [place], [facility] [phone]

## 2018-06-21 LAB — CBC WITH DIFFERENTIAL/PLATELET
Absolute Monocytes: 360 cells/uL (ref 200–950)
BASOS ABS: 49 {cells}/uL (ref 0–200)
Basophils Relative: 0.8 %
EOS ABS: 329 {cells}/uL (ref 15–500)
Eosinophils Relative: 5.4 %
HCT: 33.4 % — ABNORMAL LOW (ref 35.0–45.0)
Hemoglobin: 11.2 g/dL — ABNORMAL LOW (ref 11.7–15.5)
Lymphs Abs: 2678 cells/uL (ref 850–3900)
MCH: 25.9 pg — AB (ref 27.0–33.0)
MCHC: 33.5 g/dL (ref 32.0–36.0)
MCV: 77.1 fL — ABNORMAL LOW (ref 80.0–100.0)
MONOS PCT: 5.9 %
MPV: 10.3 fL (ref 7.5–12.5)
NEUTROS PCT: 44 %
Neutro Abs: 2684 cells/uL (ref 1500–7800)
PLATELETS: 320 10*3/uL (ref 140–400)
RBC: 4.33 10*6/uL (ref 3.80–5.10)
RDW: 14.6 % (ref 11.0–15.0)
TOTAL LYMPHOCYTE: 43.9 %
WBC: 6.1 10*3/uL (ref 3.8–10.8)

## 2018-06-21 LAB — COMPLETE METABOLIC PANEL WITH GFR
AG RATIO: 1.4 (calc) (ref 1.0–2.5)
ALT: 14 U/L (ref 6–29)
AST: 17 U/L (ref 10–35)
Albumin: 4.2 g/dL (ref 3.6–5.1)
Alkaline phosphatase (APISO): 64 U/L (ref 33–130)
BILIRUBIN TOTAL: 0.3 mg/dL (ref 0.2–1.2)
BUN/Creatinine Ratio: 10 (calc) (ref 6–22)
BUN: 10 mg/dL (ref 7–25)
CALCIUM: 9.6 mg/dL (ref 8.6–10.4)
CO2: 22 mmol/L (ref 20–32)
Chloride: 109 mmol/L (ref 98–110)
Creat: 1.01 mg/dL — ABNORMAL HIGH (ref 0.50–0.99)
GFR, EST AFRICAN AMERICAN: 69 mL/min/{1.73_m2} (ref >=60)
GFR, EST NON AFRICAN AMERICAN: 60 mL/min/{1.73_m2} (ref >=60)
GLOBULIN: 2.9 g/dL (ref 1.9–3.7)
Glucose, Bld: 131 mg/dL (ref 65–139)
Potassium: 4 mmol/L (ref 3.5–5.3)
SODIUM: 140 mmol/L (ref 135–146)
TOTAL PROTEIN: 7.1 g/dL (ref 6.1–8.1)

## 2018-06-21 LAB — HEMOGLOBIN A1C
Hgb A1c MFr Bld: 6.4 % of total Hgb — ABNORMAL HIGH (ref <5.7)
Mean Plasma Glucose: 137 (calc)
eAG (mmol/L): 7.6 (calc)

## 2018-06-23 ENCOUNTER — Other Ambulatory Visit: Payer: Self-pay | Admitting: Nurse Practitioner

## 2018-06-23 MED FILL — levETIRAcetam 500 MG TABS: 500 | 90 days supply | Qty: 180 | Fill #0

## 2018-07-25 ENCOUNTER — Other Ambulatory Visit: Payer: Self-pay | Admitting: *Deleted

## 2018-07-25 DIAGNOSIS — I10 Essential (primary) hypertension: Secondary | ICD-10-CM

## 2018-07-25 MED ORDER — LISINOPRIL 30 MG PO TABS: ORAL_TABLET | ORAL | 1 refills | Status: DC

## 2018-07-25 MED FILL — LISINOPRIL 30 MG TABS: 30 | 90 days supply | Qty: 90 | Fill #0

## 2018-07-25 NOTE — Telephone Encounter (Signed)
[facility].  

## 2018-07-29 ENCOUNTER — Telehealth: Payer: Self-pay | Admitting: *Deleted

## 2018-07-29 NOTE — Telephone Encounter (Signed)
This should be okay to use, she can also verify with the pharmacist to make sure there is no interactions with her current medication.

## 2018-07-29 NOTE — Telephone Encounter (Signed)
Patient called and stated that she is having hair loss and thinks it is coming from past surgeries. A Nurse friend recommended her to take Collagen for it but advised her to call you first to make sure it was ok. Please Advise.

## 2018-07-29 NOTE — Telephone Encounter (Signed)
Patient notified and agreed.  

## 2018-08-18 NOTE — Telephone Encounter (Signed)
Patient would like to know if Dr. Rachelle Hora will need another echo prior to appointment on 10/28/2018.  Please place orders for echo and I included Dr. Rachelle Hora [facility] to call to schedule same day echo appointment    Thank you.    Tiffany

## 2018-08-19 NOTE — Telephone Encounter (Signed)
Patient needs Dr. Derrill Center fed ex account for her previous physician to send records to Dr. Rachelle Hora.    Does Dr. Rachelle Hora or Electrophysiology have a fed ex account number we can give to her provider?     Thank you,  Shanda Bumps

## 2018-08-25 NOTE — Telephone Encounter (Signed)
Silvestre Mesi Paz  Broadus John; Kristin Tolentino   Caller: Unspecified (1 week ago, 3:32 PM)            Hi Jess,   Couple questions: Are we requesting records? Are they able to send via regular mail?     We don't usually provide outside facilities/providers our acct#. We use it mainly when we are sending out discs to patients (so we can track if needed) or any other important documents/items that we need to track. They should either use their own acct number or send via USPS. Pt has appt in May with Dr. Rachelle Hora-- perhaps she can bring records with her if the matter in non-urgent.     Thank you,   Trula Ore   Practice Analyst   [phone]   [address] [place]. [place], [place] [address]      Called patient let her know about the above message and said to call back if she has any questions or concerns.     Thank you,  Shanda Bumps

## 2018-08-25 NOTE — Telephone Encounter (Signed)
Patient calling to follow up about CD sent to Dr. Rachelle Hora.   Cd is not yet received as of 08/25/2018, has appointment with Dr. Rachelle Hora on 10/28/2018.  Patient would like to have the CD after it is uploaded and will pick up at the 10/28/2018 appointment.    Thank you,  Tiffany

## 2018-09-08 NOTE — Progress Notes (Signed)
2 hours of AF noted in [employer] transmission from March 14 at 3:30am.   Didn't call patient to correlate, as I assume she was asleep.     Her last long AF event was on Jan 27th. She had been in contact with Dr. Rachelle Hora and he instructed to make appt if she wanted to discuss further mgmt options.   Appt with Dr. Rachelle Hora May 5th.  Will continue to monitor AF burden.  Routing to Dr. Rachelle Hora as Lorain Childes.    Park Breed, RN  [facility]  Electrophysiology Remote Monitoring

## 2018-09-10 ENCOUNTER — Other Ambulatory Visit: Payer: Self-pay | Admitting: Nurse Practitioner

## 2018-09-10 MED FILL — metFORMIN HCL 1000 MG TABS: 1000 | 90 days supply | Qty: 180 | Fill #0

## 2018-09-10 NOTE — Telephone Encounter (Signed)
Metformin 1000 mg BID #180 with 1 refill sent to [facility].  Patient had OV 12/29019 with instructions to followup in 4 months.  Has an appointment scheduled 4/17.

## 2018-10-01 MED FILL — levETIRAcetam 500 MG TABS: 500 | 90 days supply | Qty: 180 | Fill #0

## 2018-10-01 MED FILL — LISINOPRIL 30 MG TABLET: 30 | 90 days supply | Qty: 90 | Fill #0

## 2018-10-08 ENCOUNTER — Encounter: Payer: Self-pay | Admitting: Nurse Practitioner

## 2018-10-08 ENCOUNTER — Ambulatory Visit (INDEPENDENT_AMBULATORY_CARE_PROVIDER_SITE_OTHER): Payer: 59 | Admitting: Nurse Practitioner

## 2018-10-08 ENCOUNTER — Other Ambulatory Visit: Payer: Self-pay

## 2018-10-08 DIAGNOSIS — E119 Type 2 diabetes mellitus without complications: Secondary | ICD-10-CM | POA: Diagnosis not present

## 2018-10-08 DIAGNOSIS — E1169 Type 2 diabetes mellitus with other specified complication: Secondary | ICD-10-CM

## 2018-10-08 DIAGNOSIS — Z1212 Encounter for screening for malignant neoplasm of rectum: Secondary | ICD-10-CM | POA: Diagnosis not present

## 2018-10-08 DIAGNOSIS — G40909 Epilepsy, unspecified, not intractable, without status epilepticus: Secondary | ICD-10-CM

## 2018-10-08 DIAGNOSIS — Z1211 Encounter for screening for malignant neoplasm of colon: Secondary | ICD-10-CM | POA: Diagnosis not present

## 2018-10-08 DIAGNOSIS — I10 Essential (primary) hypertension: Secondary | ICD-10-CM

## 2018-10-08 DIAGNOSIS — D509 Iron deficiency anemia, unspecified: Secondary | ICD-10-CM

## 2018-10-08 DIAGNOSIS — E785 Hyperlipidemia, unspecified: Secondary | ICD-10-CM

## 2018-10-08 DIAGNOSIS — Z1231 Encounter for screening mammogram for malignant neoplasm of breast: Secondary | ICD-10-CM

## 2018-10-08 MED ORDER — EZETIMIBE 10 MG PO TABS: ORAL_TABLET | ORAL | 1 refills | Status: DC

## 2018-10-08 MED FILL — EZETIMIBE 10 MG TABS: 10 | 90 days supply | Qty: 90 | Fill #0 | Status: TO

## 2018-10-08 NOTE — Progress Notes (Signed)
This service is provided via telemedicine  No vital signs collected/recorded due to the encounter was a telemedicine visit.   Location of patient (ex: home, work):  Work  Patient consents to a telephone visit:  Yes  Location of the provider (ex: office, home):  [employer], Office   Names of all persons participating in the telemedicine service and their role in the encounter:  S.Chrae B/CMA, Sherrie Mustache, NP, and Patient   Time spent on call:  8 min with medical assistant   Virtual Visit via Telephone Note  I connected with Sarah Brock on 10/08/18 at 10:30 AM EDT by telephone and verified that I am speaking with the correct person using two identifiers.   I discussed the limitations, risks, security and privacy concerns of performing an evaluation and management service by telephone and the availability of in person appointments. I also discussed with the patient that there may be a patient responsible charge related to this service. The patient expressed understanding and agreed to proceed.     Careteam: Patient Care Team: Lauree Chandler, NP as PCP - General (Geriatric Medicine)  Advanced Directive information    Allergies  Allergen Reactions  . Simvastatin Other (See Comments)    Stomach pain, low back pain   . Shrimp [Shellfish Allergy] Other (See Comments)    Swelling and hypersensitivity    Chief Complaint  Patient presents with  . Medical Management of Chronic Issues    4 month follow-up. Tele-visit      HPI: Patient is a 63 y.o. female for routine follow up.  history of iron deficiency anemia, asthma, DM, HTN, seizure disorder on Keppra. Has been feeling great   DM-Does not take blood sugars at home. Continues metformin 1000 twice daily with meals. Last a1c in Dec 6.4. no hypoglycemia  AKI- from increase use of aleve, does not take at this time, improved BUN, CR on most recent labs at 1.01  Hyperlipidemia- prescribed zetia but  not taking, attempting to eat better. LDL 97 09/2017  Seizure disorder- no recurrent seizures. Continues on keppra 500 mg twice daily  Iron def anemia- continues on supplement, hgb 11.2 in December    Continues to have loose stools after hospitalization for perforated appendicitis. Probiotic and fiber supplements did not help. Making dietary modifications.   htn- continues on lisinopril 30 mg daily   No routine exercise but very active at work.   Review of Systems:  Review of Systems  Constitutional: Negative for chills, fever and weight loss.  HENT: Negative for tinnitus.   Respiratory: Negative for cough, sputum production and shortness of breath.   Cardiovascular: Negative for chest pain, palpitations and leg swelling.  Gastrointestinal: Negative for abdominal pain, constipation and heartburn.       Loose stools   Genitourinary: Negative for dysuria, frequency and urgency.  Musculoskeletal: Negative for back pain, falls, joint pain and myalgias.  Skin: Negative.   Neurological: Negative for dizziness and headaches.  Psychiatric/Behavioral: Negative for depression and memory loss. The patient does not have insomnia.     Past Medical History:  Diagnosis Date  . Anemia, unspecified   . Asthma    as a child, no problem as adult, no inhaler  . Diabetes mellitus    Type II  . History of blood transfusion    pre hysterectomy  . Osteoarthrosis, unspecified whether generalized or localized, unspecified site   . Other and unspecified hyperlipidemia   . Other convulsions  last one 2013  . Other malaise and fatigue   . Other specified cardiac dysrhythmias(427.89) 2006   "history of Bradycardia" .  wore a mointor nothing found  . Seizure disorder ([place])   . Unspecified essential hypertension   . Unspecified vitamin D deficiency    Past Surgical History:  Procedure Laterality Date  . ABDOMINAL HYSTERECTOMY    . BREAST LUMPECTOMY Right 1997  . BREAST LUMPECTOMY Bilateral     neg for cancer  . CHOLECYSTECTOMY N/A 02/28/2016   Procedure: LAPAROSCOPIC CHOLECYSTECTOMY;  Surgeon: Mickeal Skinner, MD;  Location: [place];  Service: General;  Laterality: N/A;  . IR CATHETER TUBE CHANGE  01/02/2018  . IR RADIOLOGIST EVAL & MGMT  12/25/2017  . IR RADIOLOGIST EVAL & MGMT  01/08/2018  . LAPAROSCOPIC APPENDECTOMY N/A 01/15/2018   Procedure: LAPAROSCOPIC APPENDECTOMY CONVERTED TO OPEN APPENDECTOMY SMALL BOWEL RESECTION;  Surgeon: Donnie Mesa, MD;  Location: [place];  Service: General;  Laterality: N/A;   Social History:   reports that she has never smoked. She has never used smokeless tobacco. She reports that she does not drink alcohol or use drugs.  Family History  Problem Relation Age of Onset  . Cancer Father        Esophageal    Medications: Patient's Medications  New Prescriptions   No medications on file  Previous Medications   CETIRIZINE (ZYRTEC) 10 MG TABLET    Take 10 mg by mouth at bedtime.   EZETIMIBE (ZETIA) 10 MG TABLET    APPOINTMENT OVERDUE, 1 by mouth daily   FERROUS SULFATE 325 (65 FE) MG TABLET    Take 1 tablet (325 mg total) by mouth 2 (two) times daily with a meal.   GLUCOSE BLOOD TEST STRIP    Use as instructed   LEVETIRACETAM (KEPPRA) 500 MG TABLET    TAKE 1 TABLET BY MOUTH EVERY 12 HOURS FOR SEIZURES   LISINOPRIL (PRINIVIL,ZESTRIL) 30 MG TABLET    Take one tablet by mouth once daily for blood pressure   METFORMIN (GLUCOPHAGE) 1000 MG TABLET    TAKE 1 TABLET BY MOUTH 2 TIMES DAILY WITH MEALS TO CONTROL BLOOD SUGAR  Modified Medications   No medications on file  Discontinued Medications   No medications on file     Physical Exam: unable to do tele-visit.    Labs reviewed: Basic Metabolic Panel: Recent Labs    12/16/17 1600  01/16/18 0338 01/16/18 1013 06/20/18 1615  NA 137   < > 139 138 140  K 4.3   < > 3.7 3.5 4.0  CL 103   < > 102 102 109  CO2 21   < > 27 26 22   GLUCOSE 173*   < > 124* 95 131  BUN 42*   < > 7* 7* 10   CREATININE 2.68*   < > 0.78 0.85 1.01*  CALCIUM 8.9   < > 8.3* 8.1* 9.6  TSH 1.29  --   --   --   --    < > = values in this interval not displayed.   Liver Function Tests: Recent Labs    12/19/17 0306 12/20/17 0311 12/22/17 0118 06/20/18 1615  AST 49* 36 42* 17  ALT 63* 49* 57* 14  ALKPHOS 224* 187* 193*  --   BILITOT 0.8 0.7 0.6 0.3  PROT 6.6 5.9* 6.3* 7.1  ALBUMIN 2.3* 2.1* 2.1*  --    Recent Labs    12/17/17 1352  LIPASE 25   No  results for input(s): AMMONIA in the last 8760 hours. CBC: Recent Labs    12/16/17 1600  12/25/17 1336  01/17/18 0016 01/20/18 0655 06/20/18 1615  WBC 17.0*   < > 11.3*   < > 13.9* 9.1 6.1  NEUTROABS 13,175*  --  8,113*  --   --   --  2,684  HGB 10.7*   < > 10.3*   < > 8.7* 7.5* 11.2*  HCT 33.1*   < > 32.1*   < > 28.1* 24.8* 33.4*  MCV 78.3*   < > 79.5*   < > 79.8 81.0 77.1*  PLT 313   < > 543*   < > 391 345 320   < > = values in this interval not displayed.   Lipid Panel: No results for input(s): CHOL, HDL, LDLCALC, TRIG, CHOLHDL, LDLDIRECT in the last 8760 hours. TSH: Recent Labs    12/16/17 1600  TSH 1.29   A1C: Lab Results  Component Value Date   HGBA1C 6.4 (H) 06/20/2018     Assessment/Plan 1. Hyperlipidemia associated with type 2 diabetes mellitus ([place]) -has stopped zetia, will send in refill and to follow up lab. Encouraged heart healthy diet.  - ezetimibe (ZETIA) 10 MG tablet; 1 by mouth daily  Dispense: 90 tablet; Refill: 1 - COMPLETE METABOLIC PANEL WITH GFR; Future - Lipid Panel; Future  2. Seizure disorder ([place]) No recent seizures, continues on keppra twice daily   3. Controlled type 2 diabetes mellitus without complication, without long-term current use of insulin ([place]) Continues on metformin twice daily, no hypoglycemic episodes.  Encouraged dietary compliance, routine foot care/monitoring and to keep up with diabetic eye exams through ophthalmology   - Ambulatory referral to Ophthalmology - COMPLETE  METABOLIC PANEL WITH GFR; Future - Hemoglobin A1c; Future  4. Essential hypertension sbp 130s/? Continues on lisinopril. Continue dietary modifications. To monitor blood pressure and notify if elevated.  - CBC with Differential/Platelet; Future  5. Encounter for colorectal cancer screening - Ambulatory referral to Gastroenterology  6. Breast cancer screening by mammogram - MM DIGITAL SCREENING BILATERAL; Future  7. Iron deficiency anemia, unspecified iron deficiency anemia type Continues on supplement  Next appt: to come in for labs this week and then 6 month follow up.  Sarah Brock. Harle Battiest  [facility] [phone]    Follow Up Instructions:    I discussed the assessment and treatment plan with the patient. The patient was provided an opportunity to ask questions and all were answered. The patient agreed with the plan and demonstrated an understanding of the instructions.   The patient was advised to call back or seek an in-person evaluation if the symptoms worsen or if the condition fails to improve as anticipated.  I provided 12 minutes of non-face-to-face time during this encounter.  avs printed and mailed  Lauree Chandler, NP

## 2018-10-10 ENCOUNTER — Ambulatory Visit: Payer: 59 | Admitting: Nurse Practitioner

## 2018-10-15 ENCOUNTER — Other Ambulatory Visit: Payer: Self-pay

## 2018-10-15 ENCOUNTER — Other Ambulatory Visit: Payer: 59

## 2018-10-15 DIAGNOSIS — I10 Essential (primary) hypertension: Secondary | ICD-10-CM | POA: Diagnosis not present

## 2018-10-15 DIAGNOSIS — E785 Hyperlipidemia, unspecified: Secondary | ICD-10-CM

## 2018-10-15 DIAGNOSIS — E119 Type 2 diabetes mellitus without complications: Secondary | ICD-10-CM | POA: Diagnosis not present

## 2018-10-15 DIAGNOSIS — E1169 Type 2 diabetes mellitus with other specified complication: Secondary | ICD-10-CM | POA: Diagnosis not present

## 2018-10-16 LAB — COMPLETE METABOLIC PANEL WITH GFR
AG Ratio: 1.4 (calc) (ref 1.0–2.5)
ALT: 16 U/L (ref 6–29)
AST: 19 U/L (ref 10–35)
Albumin: 4.2 g/dL (ref 3.6–5.1)
Alkaline phosphatase (APISO): 71 U/L (ref 37–153)
BUN: 12 mg/dL (ref 7–25)
CO2: 25 mmol/L (ref 20–32)
Calcium: 9.7 mg/dL (ref 8.6–10.4)
Chloride: 107 mmol/L (ref 98–110)
Creat: 0.93 mg/dL (ref 0.50–0.99)
GFR, Est African American: 76 mL/min/{1.73_m2} (ref >=60)
GFR, Est Non African American: 65 mL/min/{1.73_m2} (ref >=60)
Globulin: 2.9 g/dL (calc) (ref 1.9–3.7)
Glucose, Bld: 97 mg/dL (ref 65–99)
Potassium: 4.2 mmol/L (ref 3.5–5.3)
Sodium: 140 mmol/L (ref 135–146)
Total Bilirubin: 0.3 mg/dL (ref 0.2–1.2)
Total Protein: 7.1 g/dL (ref 6.1–8.1)

## 2018-10-16 LAB — CBC WITH DIFFERENTIAL/PLATELET
Absolute Monocytes: 448 cells/uL (ref 200–950)
Basophils Absolute: 58 cells/uL (ref 0–200)
Basophils Relative: 0.9 %
Eosinophils Absolute: 390 cells/uL (ref 15–500)
Eosinophils Relative: 6.1 %
HCT: 37.1 % (ref 35.0–45.0)
Hemoglobin: 12.2 g/dL (ref 11.7–15.5)
Lymphs Abs: 2554 cells/uL (ref 850–3900)
MCH: 26 pg — ABNORMAL LOW (ref 27.0–33.0)
MCHC: 32.9 g/dL (ref 32.0–36.0)
MCV: 78.9 fL — ABNORMAL LOW (ref 80.0–100.0)
MPV: 10.4 fL (ref 7.5–12.5)
Monocytes Relative: 7 %
Neutro Abs: 2950 cells/uL (ref 1500–7800)
Neutrophils Relative %: 46.1 %
Platelets: 296 10*3/uL (ref 140–400)
RBC: 4.7 10*6/uL (ref 3.80–5.10)
RDW: 13.3 % (ref 11.0–15.0)
Total Lymphocyte: 39.9 %
WBC: 6.4 10*3/uL (ref 3.8–10.8)

## 2018-10-16 LAB — LIPID PANEL
Cholesterol: 207 mg/dL — ABNORMAL HIGH (ref <200)
HDL: 62 mg/dL (ref >=50)
LDL Cholesterol (Calc): 120 mg/dL (calc) — ABNORMAL HIGH
Non-HDL Cholesterol (Calc): 145 mg/dL (calc) — ABNORMAL HIGH (ref <130)
Total CHOL/HDL Ratio: 3.3 (calc) (ref <5.0)
Triglycerides: 139 mg/dL (ref <150)

## 2018-10-16 LAB — HEMOGLOBIN A1C
Hgb A1c MFr Bld: 6.4 % of total Hgb — ABNORMAL HIGH (ref <5.7)
Mean Plasma Glucose: 137 (calc)
eAG (mmol/L): 7.6 (calc)

## 2018-10-17 ENCOUNTER — Ambulatory Visit: Payer: 59 | Admitting: Nurse Practitioner

## 2018-10-19 ENCOUNTER — Encounter: Payer: Self-pay | Admitting: Nurse Practitioner

## 2018-11-18 ENCOUNTER — Ambulatory Visit: Admit: 2018-11-18 | Discharge: 2018-11-18 | Payer: BLUE CROSS/BLUE SHIELD

## 2018-11-18 DIAGNOSIS — Z950 Presence of cardiac pacemaker: Secondary | ICD-10-CM

## 2018-11-18 DIAGNOSIS — I480 Paroxysmal atrial fibrillation: Secondary | ICD-10-CM

## 2018-11-18 DIAGNOSIS — I441 Atrioventricular block, second degree: Secondary | ICD-10-CM

## 2018-11-18 DIAGNOSIS — I472 Ventricular tachycardia: Secondary | ICD-10-CM

## 2018-11-18 NOTE — Progress Notes (Signed)
I performed this consultation using real-time Telehealth tools, including a live video connection between my location and the patient's location. Prior to initiating the consultation, I obtained informed verbal consent to perform this consultation using Telehealth tools and answered all the questions about the Telehealth interaction.    DATE OF VISIT:  11/18/2018       CHIEF COMPLAINTS: Mobitz II AV block status post pacemaker, atrial fibrillation, NSVT     I had the pleasure of seeing [name] as a telehealth video visit in Cardiac Electrophysiology today for further evaluation of symptomatic bradycardia, status post dual-chamber pacemaker, atrial fibrillation, and NSVT.    [name] is a 63 y.o. woman with history of thyroid nodule, IBS, and hysterectomy who presented with exertional fatigue and mild shortness of breath, as well as occasional palpitations that awakened her in the middle of the night.  I reviewed old records and/or communicated with other professionals for a total of 30 minutes on 11/18/2018.  This is directly related to a face-to-face visit encounter I personally provided today.    Summary of records review and/or communication:    Exertional symptoms, which started on 08/23/2015 and persisted thereafter, were also sometimes associated with lightheadedness.  She had previously been very active, feeding and riding her horse nearly every day.  Evaluation was notable for a baseline right bundle branch block, and periods of symptomatic 2:1 AV block while reaching peak exercise on a treadmill stress test (see details below).  Holter monitoring also reportedly showed blocked PAC's with resultant bradycardia, though to my review blocked atrial activity was not typically premature (see details below). 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     A dual-chamber  device was implanted without complication on 07/01/9148.  Thereafter, she noticed significant improvement in her symptoms, but had one episode very similar to her pre-pacemaker symptoms on 11/18/2015 and several milder episodes afterwards aborted by resting.  Device function was normal -- at follow-up 11/2015, she paced 5% in the RA and 1% in the RV.  She underwent breast augmentation surgery on 04/18/2016.     At her visit in April 2018, she reported having a "wide complex tachycardia" for 5 minutes (up to 115 bpm) after a colonoscopy 06/2016.  She also started having episodes of palpitations in February and specifically recalled symptomatic episodes on 2/12 and 2/24 -- the latter correlated with a >1 hour episode of atrial fibrillation recorded by the device.  She was started on aspirin 81 mg daily and recommended to take metoprolol 12.5 mg twice daily, though opted not to take it.     At her visit in 03/2017, she reported shortness of breath with exertion (such as walking up stairs) and wondered if there could be a functional issue with her heart -- she actually contacted me prior to the visit to request that an echocardiogram be scheduled the same day.  The echocardiogram showed normal LV function and no hemodynamically significant valvular disease.  Device interrogation showed 62 minutes of atrial fibrillation between April and October 2018, with the longest episode lasting 11:30 minutes on 01/01/2017.  Rate histograms showed a shift to the left, with atrial sensing or pacing in the 60s more than 50% of the time and only rare atrial sensing above 90 bpm.  We agreed to try adding rate-response to her device, and to continue conservative management of  the rare atrial fibrillation.   Device check in 06/2017 showed 0.1% atrial high rate detection (10 episodes), with the longest 53 minutes.   Device check in 10/2017 showed 0.1% atrial high rate detection, with the longest episode 35 minutes.  There was also a 20 beat  episode of NSVT recorded on 09/17/2017, correlating with an episode of lightheadedness (see my note dated 11/15/2017).  I recommended she try taking metoprolol 12.5 mg twice daily and consider cardiac MRI and/or PET (particularly given the paroxysmal AV block and atrial fibrillation).   Cardiac MRI in 12/2017 revealed mid-wall and subepicardial late gadolinium enhancement in the lateral wall at the level of the base.  A follow-up PET CT scan showed no abnormal FDG uptake and no resting perfusion defects.   She contacted me 03/19/2018 to report some chest pain "off and on" starting the day before and sent remote device transmissions.   At her last visit in 04/2018, she was feeling well overall. She had occasional symptoms that corresponded to atrial fibrillation, though she did not feel it was impacting her life significantly. She denied any additional lightheadedness. She preferred to continue metoprolol for atrial fibrillation.    She had a follow-up echocardiogram in early May 2020 showing normal LV function (details below).  Recent device interrogation continues to show an overall low burden of atrial fibrillation (<1%), with the longest episodes lasting about 5 hours.    Today, Debbie Harper feels "pretty good," though she is having some problems with fatigue.  She is not sleeping well at night.  There were about 2 weeks when she was up "every night" with the sensation of an irregular heart rate whenever she laid down -- this was shortly after starting a new batch of metoprolol.  She would sit back up immediately and feel better, or sometimes she would take an extra half-pill of metoprolol.    She denies chest discomfort, shortness of breath, orthopnea, paroxysmal nocturnal dyspnea, lower extremity edema, lightheadedness, presyncope, or syncope.    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 - Oral    Class: Historical Med    estradiol (ESTRING) 2 mg (7.5 mcg /24 hour) vaginal ring        Sig - Route: Place vaginally every 3 (three) months. Use as instructed - Vaginal    Class: Historical Med    metoprolol tartrate (LOPRESSOR) 25 mg tablet        Sig - Route: Take 12.5 mg by mouth 2 (two) times daily. - Oral    Class: Historical Med          ALLERGIES     Allergies as of 11/18/2018  Review status set to Review Complete by You on 05/14/2018      Severity Noted Reaction Type Reactions    Sulfamethoxazole-trimethoprim Not Specified  10/18/2015    Itching    Insomnia          SOCIAL HISTORY     She reports that she has never smoked. She has never used smokeless tobacco. She reports current alcohol use of about 1.0 standard drinks of alcohol per week. She reports that she does not use drugs.    She is divorced.  She has 3 children.  She lives in [place], [place].  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    She has 2 horses (a Engineer, water and a Tornado, ages 13 and 57).  She also wakeboards in the summer.    FAMILY HISTORY     Her father underwent CABG in his late 89's and has cardiomyopathy.  He was a heavy smoker.  He is alive at 71.    Her mother had a heart attack around age 50.  She died after complications from a head injury in June 2016 after being knocked down by a horse.  Her grandfather had colon cancer.    REVIEW OF SYSTEMS     Constitutional:    No fevers, chills, or diaphoresis.  No unexpected weight change.   Skin:  No rashes.   Eyes:  No swelling, pain, or redness.  No photophobia or visual disturbance.  + reading glasses.  + fuzzy  vision sometimes (over the past several years).  HENT:  No neck pain or stiffness.  No hearing loss or tinnitus.  No nosebleeds.   Respiratory:  No shortness of breath at rest.  + dyspnea on exertion   Cardiovascular:  As detailed above.  Gastrointestinal:  + IBS with cramping and gas.  No blood in the stool.  + polyps on colonoscopy in 2015.  Genitourinary:  No difficulty urinating, dysuria, or hematuria.   Neurological:  No vertigo or syncope.  + occasional headaches.  No facial asymmetry or speech difficulty.  + occasional tingling left hand.  No focal weakness.  No seizures.  Hematological:  + easy bruising on backs of hands since 2012.  Musculoskeletal:  No arthralgias or joint swelling.      Psychiatric:  No change in concentration.  No confusion.  No agitation or anxiety.     PHYSICAL EXAMINATION     Constitutional: Patient appears well-developed and well-nourished. Pleasant and appropriately interactive.  Head: Normocephalic and atraumatic.   Eyes: Conjunctivae and EOM are normal.   Neck: Normal range of motion.   Pulmonary/Chest: Effort normal. No respiratory distress. No cough.  Abdomen: No obvious abdominal distension.  Neurological: Alert and oriented to person, place, and time. Able to stand from sitting and walk.  Psychiatric: Normal mood and affect. Behavior is normal. Judgment and thought content normal.   Skin: No visible rash.     DATA     I reviewed prior 12-lead EKGs:  - 04/02/2017: competing sinus rhythm and atrial pacing at 60 bpm with intact AV conduction and right bundle branch block (QRSd 124 msec).  - 10/02/2016: atrial pacing at 61 bpm with intact AV conduction and right bundle branch block  - 11/29/2015: normal sinus rhythm with right bundle branch block at 64 bpm.  - 10/18/2015: sinus bradycardia at 57 bpm with right bundle branch block  - 08/29/2015: sinus bradycardia at 57 bpm with right bundle branch block.    Remote pacemaker interrogation today, 11/18/2018 (see separate full report in  [facility]):    - She paces 45.4% in the RA (stable) and 4.6% in  the RV (stable).    - Atrial fibrillation burden has been 0.9%, the longest episode lasting 5 hours on 11/07/2018 with max V-rate of 130 bpm (max V-rate during all atrial fibrillation episodes 133 bpm)  - No NSVT recorded  - Many longer episodes were overnight      I reviewed the report and tracings from a 24-hour Holter monitor 08/31/2015 [facility]):  - Average HR 64.  Minimum 38.  Maximum 97.  Predominant rhythm sinus.  - Single PVC's occasional; no runs.  - Frequent blocked PACs with resultant bradycardia.  No SVT or atrial fibrillation.    - Occasional shortness of breath during periods of bradycardia with blocked PACs.  - On my review of tracings, several strips appear more consistent with sinus rhythm with 2:1 block than with blocked PAC's based on timing (though P-wave morphology is subtly different with blocked beats).  On tracings with both 2:1 and 1:1 conduction, the P-wave CL remains constant and ventricular rate exactly 1/2 during 2:1 block.  When transitioning from 1:1 to 2:1 block, the first blocked P-wave comes slightly early, but possibly due to increase in sinus rate.  - Less frequently (particularly 2:26am), bradycardia appears due to blocked PAC's in bigeminy.  - One period of 3:2 conduction (9:28am) without clear PR prolongation    I reviewed the reports of the following tests:   Echocardiogram 10/31/2018 ([place]):       Cardiac PET 02/17/2018:  1.   Normal perfusion in the left ventricle without myocardial uptake of FDG. Findings are suggestive of no active myocardial inflammatory process or scar.  2.    Normal left ventricular wall motion with a calculated left ventricular ejection fraction of >65% on rest perfusion images.     Cardiac MRI 12/31/2017:  1. Mid wall and subepicardial late gadolinium enhancement in the lateral wall at the level of the base which can be seen with myocarditis or other inflammatory  cardiomyopathy. Remaining myocardium demonstrates no evidence of scar.  2. Normal left ventricular function (ejection fraction 73%).     Echocardiogram 04/02/2017:  The patient's blood pressure was 154 mmHg/70 mmHg during the study. The heart rate during the study was 73. Color Flow Doppler was utilized for this exam. Spectral Doppler was utilized for this exam.  1. The left ventricular volume is normal. LV function is hyperdynamic. LV ejection fraction is estimated to be 70 to 75%.  2. The right ventricular volume is normal. Right ventricular function is normal.  3. Left atrial size is normal. Right atrial size is normal.  4. There is no hemodynamically significant valvular disease.  5. There is no Doppler evidence of LV diastolic dysfunction.  6. The RV systolic pressure is estimated to be 23 mmHg based on a right atrial pressure of 3 mmHg.  7. No pericardial effusion noted. The inferior vena cava is less than 21 mm in diameter and collapses with inspiration consistent with a right atrial pressure of 3 mmHg.  8. Aortic root dimension is normal.  - Compared to the previous study on 09/23/15, the prior study is a stress echo.     Exercise stress echocardiogram 09/23/2015 [facility]):  - Resting EKG sinus rhythm with right bundle branch block   - Resting echocardiogram: Normal LV size, wall thickness, and function, EF 60-65%.  Atria normal size.  RV normal.  Mild anterior leaflet MVP and mild MR.  No other significant valvular pathology.  No pericardial effusion.  - Walked  8 min 11 sec on [place], with peak HR 118 bpm early but development of 2:1 AV block at peak exercise with HR in the 60s and associated shortness of breath (2:1 block versus atrial bigeminy with block).  Returned to 1:1 conduction in recovery.  Episodes of supraventricular bigeminy noted.  - I reviewed the tracings:  - At 2:24, there is a period of 3:2 conduction with constant P-P interval and no clear PR prolongation, that appears to  persist through 5:50  - HR then drops from 89 to 74 but P-waves difficult to see  - At 6:50 there appears to be 2:1 block with occasional outflow tract PVC's  - At 7:50 there is suspicion for 2:1 conduction with blocked P-waves in prior T-waves.  - At 15 seconds into recovery there are periods with 1:1 conduction at double the rate of final exercise EKGs (5:4 and 6:5 conduction, but with little to no change in PR interval and R-R interval around blocked P-wave exactly double that of conducted intervals).  This apparent Mobitz type II second degree AV block becomes more apparent as the rate continues to slow in recovery.  - By 2 minutes into recovery there is clear 2:1 AV block that persists to 3 minutes, though with one episode of 3:2 block that appears slightly more consistent with Wenckebach based on the PR intervals and length of pause  - Stress echocardiogram:  Appropriate increase in contractility of all segments; no evidence of ischemia.     Chest CTA 09/22/2015 [facility]):  - Negative for PE or other explanation for shortness of breath.  - Few liver lesions, probably cysts.    - Ascending and descending aorta normal caliber     PFTs 09/08/2015:  - FVC, FEV1, FEV1/FVC ratio, and FEF25-75% within normal limits  - TLC within normal limits  - FRC and RV increased  - With bronchodilators, there is significant response.  - Diffusing capacity high.  - Conclusions: Increased diffusing capacity consistent with left heart failure or shunting.  Although flow rates are within normal limits, the overinflation and response to bronchodilators are characteristic of reactive airways ("minimal")    I reviewed laboratory evaluation from [facility] dated 03/19/2017:  Na 141, K 4.4, Cl 104, CO2 31, BUN 18, Cr 0.7, Glu 86  LFTs normal (alk phos low at 44)  WBC 4.0, Hb 13.8, Plt 146  TC 209, LDL 105, HDL 93, TG 53    ASSESSMENT AND RECOMMENDATIONS     In summary, Ms. Carrender is a 63 y.o. woman with exertional fatigue and  shortness of breath, with evidence of Mobitz type II second degree AV block and periods of 2:1 block on stress testing and Holter monitoring, status post dual-chamber pacemaker implant on 10/31/2015.  She was subsequently diagnosed with paroxysmal atrial fibrillation, as well as occasional NSVT.  Imaging evaluation showed mid-wall and epicardial LGE at the lateral base, but no perfusion defects or evidence of abnormal FDG uptake on subsequent PET scanning.    We focused our evaluation and discussion today on the following problems:    1) Atrial fibrillation:  - She had a prior history of relatively infrequent palpitations and has been diagnosed with paroxysmal atrial fibrillation via device interrogation.  The burden has increased slightly but remains at <1%.  Several weeks ago she had consistent nightly symptoms of an irregular heartbeat -- interrogation indeed shows longer episodes during overnight hours, though concentrated dominantly on 5 or 6 days over the past 2 months.  -  I have previously discussed the pathophysiology of atrial fibrillation and multiple management options with Ms. Dubs. Given her associated symptoms, I would recommend that efforts be directed to maintaining sinus rhythm. She would be a good candidate for either a trial of antiarrhythmic drug therapy or catheter ablation.    After discussion, Ms. Rosenstock is reluctant to consider additional medication or an invasive procedure.    - I suggested she increase her metoprolol dose to 25 mg at night, given that symptoms primarily occur at night.  She can return to prior dosing if she has uncomfortable side effects.  - If symptoms become more frequent and/or more persistent, we will revisit the possibility of antiarrhythmic drug therapy or catheter ablation.    2) Stroke prevention:  - Her CHA2DS2-VASc score is 1, for gender only.  This corresponds to a yearly stroke risk of 1.5% with no therapy, 1.2% with aspirin alone, and 0.3-0.5% on therapeutic  anticoagulation.  - Given the low burden, I think aspirin alone remains reasonable for now.  However, I have previously advised her that my general preference is a more aggressive approach to anticoagulation given the safety of the newer medications and the potentially devastating effects of stroke -- should she have significantly increasing burden, we may re-visit the potential risks and benefits of NOAC therapy.    3) Mobitz type II second degree AV block with symptomatic bradycardia, status post pacemaker:  - The device is functioning normally, and the implant site is well healed.  Her ventricular pacing burden has been low overall.    - She will continue routine pacemaker checks remotely and in the clinic.    4) Possible sinus node dysfunction:  - She had dyspnea on exertion, and rate histograms showed overall slower sinus rates and increased atrial pacing burden.    - Rate-response was previously enabled to improve chronotropic competence.      5) NSVT:  - She had a symptomatic, 20-beat run of NSVT in 08/2017 associated with significant lightheadedness.  We initiated the metoprolol after that, and she has since had minimal brief NSVT.  - Given the combination of AV block, sinus node dysfunction, paroxysmal atrial fibrillation, and NSVT, I suggested further evaluation for inflammatory cardiomyopathy such as sarcoid.  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  The etiology of the LGE is not clear -- perhaps she had a prior episode of myocarditis.    - The combination of the LGE and NSVT is obviously concerning, though she has normal LV function, no sustained arrhythmias, and no syncope.  Since starting low dose metoprolol she has had only the 5-beat asymptomatic run.  I do not think there is an indication for ICD upgrade at this time.       It is a pleasure participating in Ms.  Scorza's care.  I spent a total of 40 minutes reviewing records/data and face-to-face with the patient; >50% of that time was spent counseling regarding the symptoms, treatment plan, risks and/or therapeutic options for the diagnoses above.    Please do not hesitate to contact me with any questions or concerns.    Estrellita Ludwig. Manus Rudd, MD, [facility], [employer]  Associate Professor of Clinical Medicine  Division of Cardiology  Cardiac Electrophysiology

## 2018-11-18 NOTE — Telephone Encounter (Signed)
Dear Dr. Rachelle Hora ,    I have uploaded a disc(s) with Echo images/records into the system for your viewing. Please contact me should you have any questions or concerns.    Disc has been:  [x   ] Returned to patient by Postal mail (Mail/Physically handed back to patient or provider)    [   ] Disposed of in [place].     Thank You!  [name]

## 2018-11-18 NOTE — Telephone Encounter (Signed)
DISC RECEIVED FOR PATIENT:    1. How was disc received (i.e. Pt. Drop Off/Given, FedEx Delivered, etc.)? [employer]PS  2. Does the patient have an upcoming appointment with [place]? 11/18/2018  3. Who is the attending provider? Dr. Rachelle Hora  4. If NOT uploaded, what next steps were taken (i.e. Handed off to [staff member's name]? Disc Uploaded

## 2018-11-18 NOTE — Progress Notes (Deleted)
I performed this consultation using real-time Telehealth tools, including a live video connection between my location and the patient's location. Prior to initiating the consultation, I obtained informed verbal consent to perform this consultation using Telehealth tools and answered all the questions about the Telehealth interaction.    <APPNOTE>    DATE OF VISIT:  11/18/2018       CHIEF COMPLAINTS: Mobitz II AV block status post pacemaker, atrial fibrillation, NSVT     I had the pleasure of seeing Debbie Harper in Cardiac Electrophysiology today for further evaluation of symptomatic bradycardia, status post dual-chamber pacemaker, atrial fibrillation, and NSVT.    Debbie Harper is a 63 y.o. woman with history of thyroid nodule, IBS, and hysterectomy who presented with exertional fatigue and mild shortness of breath, as well as occasional palpitations that awakened her in the middle of the night.  I reviewed old records and/or communicated with other professionals for a total of 30 minutes on 11/18/2018.  This is directly related to a face-to-face visit encounter I personally provided today.    Summary of records review and/or communication:    Exertional symptoms, which started on 08/23/2015 and persisted thereafter, were also sometimes associated with lightheadedness.  She had previously been very active, feeding and riding her horse nearly every day.  Evaluation was notable for a baseline right bundle branch block, and periods of symptomatic 2:1 AV block while reaching peak exercise on a treadmill stress test (see details below).  Holter monitoring also reportedly showed blocked PAC's with resultant bradycardia, though to my review blocked atrial activity was not typically premature (see details below). 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     A dual-chamber device was  implanted without complication on 03/02/9891.  Thereafter, she noticed significant improvement in her symptoms, but had one episode very similar to her pre-pacemaker symptoms on 11/18/2015 and several milder episodes afterwards aborted by resting.  Device function was normal -- at follow-up 11/2015, she paced 5% in the RA and 1% in the RV.  She underwent breast augmentation surgery on 04/18/2016.     At her visit in April 2018, she reported having a "wide complex tachycardia" for 5 minutes (up to 115 bpm) after a colonoscopy 06/2016.  She also started having episodes of palpitations in February and specifically recalled symptomatic episodes on 2/12 and 2/24 -- the latter correlated with a >1 hour episode of atrial fibrillation recorded by the device.  She was started on aspirin 81 mg daily and recommended to take metoprolol 12.5 mg twice daily, though opted not to take it.     At her visit in 03/2017, she reported shortness of breath with exertion (such as walking up stairs) and wondered if there could be a functional issue with her heart -- she actually contacted me prior to the visit to request that an echocardiogram be scheduled the same day.  The echocardiogram showed normal LV function and no hemodynamically significant valvular disease.  Device interrogation showed 62 minutes of atrial fibrillation between April and October 2018, with the longest episode lasting 11:30 minutes on 01/01/2017.  Rate histograms showed a shift to the left, with atrial sensing or pacing in the 60s more than 50% of the time and only rare atrial sensing above 90 bpm.  We agreed to try adding rate-response to her device, and to continue conservative management of the  rare atrial fibrillation.   Device check in 06/2017 showed 0.1% atrial high rate detection (10 episodes), with the longest 53 minutes.   Device check in 10/2017 showed 0.1% atrial high rate detection, with the longest episode 35 minutes.  There was also a 20 beat episode of  NSVT recorded on 09/17/2017, correlating with an episode of lightheadedness (see my note dated 11/15/2017).  I recommended she try taking metoprolol 12.5 mg twice daily and consider cardiac MRI and/or PET (particularly given the paroxysmal AV block and atrial fibrillation).   Cardiac MRI in 12/2017 revealed mid-wall and subepicardial late gadolinium enhancement in the lateral wall at the level of the base.  A follow-up PET CT scan showed no abnormal FDG uptake and no resting perfusion defects.   She contacted me 03/19/2018 to report some chest pain "off and on" starting the day before and sent remote device transmissions.   At her last visit in 04/2018, she was feeling well overall. She had occasional symptoms that corresponded to atrial fibrillation, though she did not feel it was impacting her life significantly. She denied any additional lightheadedness. She preferred to continue metoprolol for atrial fibrillation.    She had a follow-up echocardiogram in early May 2020 showing normal LV function (details below).  Recent device interrogation continues to show an overall low burden of atrial fibrillation (<1%), with the longest episodes lasting about 5 hours.    Today, Debbie Harper ***      PAST MEDICAL HISTORY     Past Medical History:   Diagnosis Date    Chronotropic incompetence 10/18/2015    History of motion sickness     IBS (irritable bowel syndrome) 10/18/2015    IBS (irritable bowel syndrome)     Mobitz type 2 second degree atrioventricular block 10/18/2015    Pacemaker 11/29/2015    RBBB 10/18/2015    Thyroid nodule 10/18/2015       CURRENT MEDICATIONS     Your Medications at the End of This Visit       Disp Refills Start End    aspirin 81 mg EC tablet        Sig - Route: Take 81 mg by mouth Daily. - Oral    Class: Historical Med    estradiol (ESTRING) 2 mg (7.5 mcg /24 hour) vaginal ring        Sig - Route: Place vaginally every 3 (three) months. Use as instructed - Vaginal    Class: Historical Med     metoprolol tartrate (LOPRESSOR) 25 mg tablet        Sig - Route: Take 12.5 mg by mouth 2 (two) times daily. - Oral    Class: Historical Med          ALLERGIES     Allergies as of 11/14/2018  Review status set to Review Complete by Virgel Bouquet, MD on 05/14/2018      Severity Noted Reaction Type Reactions    Sulfamethoxazole-trimethoprim Not Specified 10/18/2015    Itching    Insomnia          SOCIAL HISTORY     She reports that she has never smoked. She has never used smokeless tobacco. She reports current alcohol use of about 1.0 standard drinks of alcohol per week. She reports that she does not use drugs.    She is divorced.  She has 3 children.  She lives in [place], [place].  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  She has 2 horses (a Palomino and a [place], ages 60 and 65).  She also wakeboards in the summer.    FAMILY HISTORY     Her father underwent CABG in his late 36's and has cardiomyopathy.  He was a heavy smoker.  He is alive at 29.    Her mother had a heart attack around age 23.  She died after complications from a head injury in June 2016 after being knocked down by a horse.  Her grandfather had colon cancer.    REVIEW OF SYSTEMS     Constitutional:    No fevers, chills, or diaphoresis.  No unexpected weight change.   Skin:  No rashes.   Eyes:  No swelling, pain, or redness.  No photophobia or visual disturbance.  + reading glasses.  + fuzzy vision sometimes (over the past several years).  HENT:  No neck pain or stiffness.  No hearing loss or tinnitus.  No nosebleeds.   Respiratory:  No shortness of breath at rest.  + dyspnea on exertion   Cardiovascular:  As detailed above.  Gastrointestinal:  + IBS with cramping and gas.  No blood in the stool.  + polyps on colonoscopy in 2015.  Genitourinary:  No difficulty urinating, dysuria, or hematuria.   Neurological:  No vertigo or syncope.  + occasional headaches.  No facial asymmetry or speech difficulty.  + occasional tingling left  hand.  No focal weakness.  No seizures.  Hematological:  + easy bruising on backs of hands since 2012.  Musculoskeletal:  No arthralgias or joint swelling.      Psychiatric:  No change in concentration.  No confusion.  No agitation or anxiety.     PHYSICAL EXAMINATION     General: Well-appearing Caucasian female in no apparent distress.  Eyes: Conjunctivae and lids normal.    ENMT:  Ears and nose externally normal.  Hearing intact.  Oropharynx clear.    Respiratory:  Normal respiratory effort.     Cardiovascular:  PMI not-displaced.  Regular rate and rhythm, with no murmurs.    Gastrointestinal:  Soft, non-tender, and non-distended.   Musculoskeletal:  Normal gait and station.  Digits and nails without clubbing, cyanosis, petechiae, or nodes.   Skin:  Left pectoral implant site well-healed, with no evidence of impending erosion, migration, or infection.  No rashes or ecchymoses.  Neurologic:  Strength grossly intact. Sensation grossly intact.  Psychiatric:  Normal judgement and insight.  Normal mood and affect.  Oriented to person, place, and time.    DATA     I reviewed prior 12-lead EKGs:  - 04/02/2017: competing sinus rhythm and atrial pacing at 60 bpm with intact AV conduction and right bundle branch block (QRSd 124 msec).  - 10/02/2016: atrial pacing at 61 bpm with intact AV conduction and right bundle branch block  - 11/29/2015: normal sinus rhythm with right bundle branch block at 64 bpm.  - 10/18/2015: sinus bradycardia at 57 bpm with right bundle branch block  - 08/29/2015: sinus bradycardia at 57 bpm with right bundle branch block.    Remote pacemaker interrogation today, 11/18/2018 (see separate full report in [facility]):    - She paces 45.4% in the RA (stable) and 4.6% in the RV (stable).    - Atrial fibrillation burden has been 0.9%, the longest episode lasting 5 hours on 11/07/2018 with max V-rate of 130 bpm (max V-rate during all atrial fibrillation episodes 133 bpm)  - No NSVT recorded    I reviewed  the report and  tracings from a 24-hour Holter monitor 08/31/2015 [facility]):  - Average HR 64.  Minimum 38.  Maximum 97.  Predominant rhythm sinus.  - Single PVC's occasional; no runs.  - Frequent blocked PACs with resultant bradycardia.  No SVT or atrial fibrillation.    - Occasional shortness of breath during periods of bradycardia with blocked PACs.  - On my review of tracings, several strips appear more consistent with sinus rhythm with 2:1 block than with blocked PAC's based on timing (though P-wave morphology is subtly different with blocked beats).  On tracings with both 2:1 and 1:1 conduction, the P-wave CL remains constant and ventricular rate exactly 1/2 during 2:1 block.  When transitioning from 1:1 to 2:1 block, the first blocked P-wave comes slightly early, but possibly due to increase in sinus rate.  - Less frequently (particularly 2:26am), bradycardia appears due to blocked PAC's in bigeminy.  - One period of 3:2 conduction (9:28am) without clear PR prolongation    I reviewed the reports of the following tests:   Echocardiogram 10/31/2018 ([facility]):       Cardiac PET 02/17/2018:  1.   Normal perfusion in the left ventricle without myocardial uptake of FDG. Findings are suggestive of no active myocardial inflammatory process or scar.  2.    Normal left ventricular wall motion with a calculated left ventricular ejection fraction of >65% on rest perfusion images.     Cardiac MRI 12/31/2017:  1. Mid wall and subepicardial late gadolinium enhancement in the lateral wall at the level of the base which can be seen with myocarditis or other inflammatory cardiomyopathy. Remaining myocardium demonstrates no evidence of scar.  2. Normal left ventricular function (ejection fraction 73%).     Echocardiogram 04/02/2017:  The patient's blood pressure was 154 mmHg/70 mmHg during the study. The heart rate during the study was 73. Color Flow Doppler was utilized for this exam. Spectral Doppler was utilized  for this exam.  1. The left ventricular volume is normal. LV function is hyperdynamic. LV ejection fraction is estimated to be 70 to 75%.  2. The right ventricular volume is normal. Right ventricular function is normal.  3. Left atrial size is normal. Right atrial size is normal.  4. There is no hemodynamically significant valvular disease.  5. There is no Doppler evidence of LV diastolic dysfunction.  6. The RV systolic pressure is estimated to be 23 mmHg based on a right atrial pressure of 3 mmHg.  7. No pericardial effusion noted. The inferior vena cava is less than 21 mm in diameter and collapses with inspiration consistent with a right atrial pressure of 3 mmHg.  8. Aortic root dimension is normal.  - Compared to the previous study on 09/23/15, the prior study is a stress echo.     Exercise stress echocardiogram 09/23/2015 [facility]):  - Resting EKG sinus rhythm with right bundle branch block   - Resting echocardiogram: Normal LV size, wall thickness, and function, EF 60-65%.  Atria normal size.  RV normal.  Mild anterior leaflet MVP and mild MR.  No other significant valvular pathology.  No pericardial effusion.  - Walked 8 min 11 sec on [place], with peak HR 118 bpm early but development of 2:1 AV block at peak exercise with HR in the 60s and associated shortness of breath (2:1 block versus atrial bigeminy with block).  Returned to 1:1 conduction in recovery.  Episodes of supraventricular bigeminy noted.  - I  reviewed the tracings:  - At 2:24, there is a period of 3:2 conduction with constant P-P interval and no clear PR prolongation, that appears to persist through 5:50  - HR then drops from 89 to 74 but P-waves difficult to see  - At 6:50 there appears to be 2:1 block with occasional outflow tract PVC's  - At 7:50 there is suspicion for 2:1 conduction with blocked P-waves in prior T-waves.  - At 15 seconds into recovery there are periods with 1:1 conduction at double the rate of final exercise EKGs  (5:4 and 6:5 conduction, but with little to no change in PR interval and R-R interval around blocked P-wave exactly double that of conducted intervals).  This apparent Mobitz type II second degree AV block becomes more apparent as the rate continues to slow in recovery.  - By 2 minutes into recovery there is clear 2:1 AV block that persists to 3 minutes, though with one episode of 3:2 block that appears slightly more consistent with Wenckebach based on the PR intervals and length of pause  - Stress echocardiogram:  Appropriate increase in contractility of all segments; no evidence of ischemia.     Chest CTA 09/22/2015:  - Negative for PE or other explanation for shortness of breath.  - Few liver lesions, probably cysts.       PFTs 09/08/2015:  - FVC, FEV1, FEV1/FVC ratio, and FEF25-75% within normal limits  - TLC within normal limits  - FRC and RV increased  - With bronchodilators, there is significant response.  - Diffusing capacity high.  - Conclusions: Increased diffusing capacity consistent with left heart failure or shunting.  Although flow rates are within normal limits, the overinflation and response to bronchodilators are characteristic of reactive airways ("minimal")    I reviewed laboratory evaluation from [facility] dated 03/19/2017:  Na 141, K 4.4, Cl 104, CO2 31, BUN 18, Cr 0.7, Glu 86  LFTs normal (alk phos low at 44)  WBC 4.0, Hb 13.8, Plt 146  TC 209, LDL 105, HDL 93, TG 53    ASSESSMENT AND RECOMMENDATIONS     In summary, Ms. Durand is a 63 y.o. 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  She was subsequently diagnosed with paroxysmal atrial fibrillation, as well as occasional NSVT.  Imaging evaluation showed mid-wall and epicardial LGE at the lateral base, but no perfusion defects or evidence of abnormal  FDG uptake on subsequent PET scanning.    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  Her ventricular pacing burden is low overall.    - She will continue routine pacemaker checks remotely and in the clinic.    2) Possible sinus node dysfunction:  - She had dyspnea on exertion, and rate histograms showed overall slower sinus rates and increased atrial pacing burden.    - Rate-response was previously enabled to improve chronotropic competence.      3) Atrial fibrillation:  - She had a prior history of relatively infrequent palpitations and has been diagnosed with paroxysmal atrial fibrillation via device interrogation.  I have previously discussed the pathophysiology of atrial fibrillation and multiple management  options with Ms. Bouley. Episodes have remained overall brief.  - Given her associated symptoms, I would recommend that efforts be directed to maintaining sinus rhythm.   - At this point, given the low overall burden, she would like to continue conservative management with the low-dose metoprolol.      4) Stroke prevention:  - Her CHA2DS2-VASc score is 1, for gender only.  This corresponds to a yearly stroke risk of 1.5% with no therapy, 1.2% with aspirin alone, and 0.3-0.5% on therapeutic anticoagulation.  - Given the low burden, I think aspirin alone is reasonable for now.  However, I have previously advised her that my general preference is a more aggressive approach to anticoagulation given the safety of the newer medications and the potentially devastating effects of stroke -- should she have significantly increasing burden, we may re-visit the potential risks and benefits of NOAC therapy.    5) NSVT:  - She had a symptomatic, 20-beat run of NSVT in 08/2017 associated with significant lightheadedness.  We  initiated the metoprolol after that, and she has since had only 1 additional brief asymptomatic (5-beat) run of NSVT.  - Given the combination of AV block, sinus node dysfunction, paroxysmal atrial fibrillation, and NSVT, I suggested further evaluation for inflammatory cardiomyopathy such as sarcoid.  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  The etiology of the LGE is not clear -- perhaps she had a prior episode of myocarditis.    - The combination of the LGE and NSVT is obviously concerning, though she has normal LV function, no sustained arrhythmias, and no syncope.  Since starting low dose metoprolol she has had only the 5-beat asymptomatic run.  I do not think there is an indication for ICD upgrade at this time.       It is a pleasure participating in Ms. Mcnutt's care.  Please do not hesitate to contact me with any questions or concerns.    Estrellita Ludwig. Manus Rudd, MD, [facility], [facility]  Associate Professor of Clinical Medicine  Division of Cardiology  Cardiac Electrophysiology

## 2018-11-21 ENCOUNTER — Other Ambulatory Visit: Payer: Self-pay | Admitting: Nurse Practitioner

## 2018-11-21 DIAGNOSIS — I10 Essential (primary) hypertension: Secondary | ICD-10-CM

## 2018-11-21 MED FILL — metFORMIN HCL 1000 MG TABS: 1000 | 90 days supply | Qty: 180 | Fill #1

## 2018-12-19 MED FILL — EZETIMIBE 10 MG TABS: 10 | 90 days supply | Qty: 90 | Fill #0

## 2018-12-31 ENCOUNTER — Encounter: Payer: Self-pay | Admitting: Family

## 2018-12-31 ENCOUNTER — Other Ambulatory Visit: Payer: Self-pay

## 2018-12-31 ENCOUNTER — Ambulatory Visit (INDEPENDENT_AMBULATORY_CARE_PROVIDER_SITE_OTHER): Payer: 59 | Admitting: Family

## 2018-12-31 DIAGNOSIS — K921 Melena: Secondary | ICD-10-CM | POA: Diagnosis not present

## 2018-12-31 DIAGNOSIS — R197 Diarrhea, unspecified: Secondary | ICD-10-CM

## 2018-12-31 NOTE — Progress Notes (Signed)
This service is provided via telemedicine  No vital signs collected/recorded due to the encounter was a telemedicine visit.   Location of patient (ex: home, work):  Work  Patient consents to a telephone visit:  Yes  Location of the provider (ex: office, home):  Office  Name of any referring provider:  N/A  Names of all persons participating in the telemedicine service and their role in the encounter:  Shanon Rosser, Marisa Cyphers RMA, Marlowe Sax NP  Time spent on call:  10 min   Provider: Dinah Ngetich FNP-C  Lauree Chandler, NP  Patient Care Team: Lauree Chandler, NP as PCP - General (Geriatric Medicine)  Extended Emergency Contact Information Primary Emergency Contact: Thomas,Michael Address: [address]          [place], [place] [address] [place] Phone: [phone] Mobile Phone: [phone] Relation: Son Secondary Emergency Contact: [facility] Address: [address]          [place], [place] [address] [place] Phone: [phone] Mobile Phone: [phone] Relation: Relative  Code Status: Full code  Goals of care: Advanced Directive information Advanced Directives 12/31/2018  Does Patient Have a Medical Advance Directive? No  Type of Advance Directive -  Does patient want to make changes to medical advance directive? -  Copy of [place] in Chart? -  Would patient like information on creating a medical advance directive? No - Patient declined     Chief Complaint  Patient presents with  . Acute Visit    Patient c/o swelling, pain from surgical site (appendix removed) and blood in stool    HPI:  Pt is a 63 y.o. female seen today for an acute visit for evaluation of blood in the stool.she states felt tired,weak and feeling of swelling of the abdomen.Also had pain on her previous surgical site where she had appendix surgery.Pain was across the abdomen and all the way to the back.she had to  hold her abdomen whenever she was coughing.she wonders whether could have been from lifting more than 10 lbs at work.she states was previous told by PCP to watch how much she lifts.she states has chronic watery diarrhea but yesterday had some blood in the stool and also noticed blood on tissue.she felt like her abdomen was distended.she denies any flatulence,bloating, fever,chills,nausea or vomiting.she denies any bleeding,drainage from the incision site or redness.she feels much better today and is back to work wonders whether she is being over cautious.No bloody stool today. Of note she had her appendectomy one year ago in 2019.she had referral for colonoscopy but states GI called but was unable to go due to COVID-19 restrictions.she would like another referral to GI states does not have the telephone for previous GI.     Past Medical History:  Diagnosis Date  . Anemia, unspecified   . Asthma    as a child, no problem as adult, no inhaler  . Diabetes mellitus    Type II  . History of blood transfusion    pre hysterectomy  . Osteoarthrosis, unspecified whether generalized or localized, unspecified site   . Other and unspecified hyperlipidemia   . Other convulsions    last one 2013  . Other malaise and fatigue   . Other specified cardiac dysrhythmias(427.89) 2006   "history of Bradycardia" .  wore a mointor nothing found  . Seizure disorder ([place])   . Unspecified essential hypertension   . Unspecified vitamin D deficiency  Past Surgical History:  Procedure Laterality Date  . ABDOMINAL HYSTERECTOMY    . BREAST LUMPECTOMY Right 1997  . BREAST LUMPECTOMY Bilateral    neg for cancer  . CHOLECYSTECTOMY N/A 02/28/2016   Procedure: LAPAROSCOPIC CHOLECYSTECTOMY;  Surgeon: Mickeal Skinner, MD;  Location: [facility];  Service: General;  Laterality: N/A;  . IR CATHETER TUBE CHANGE  01/02/2018  . IR RADIOLOGIST EVAL & MGMT  12/25/2017  . IR RADIOLOGIST EVAL & MGMT  01/08/2018  . LAPAROSCOPIC  APPENDECTOMY N/A 01/15/2018   Procedure: LAPAROSCOPIC APPENDECTOMY CONVERTED TO OPEN APPENDECTOMY SMALL BOWEL RESECTION;  Surgeon: Donnie Mesa, MD;  Location: [facility];  Service: General;  Laterality: N/A;    Allergies  Allergen Reactions  . Simvastatin Other (See Comments)    Stomach pain, low back pain   . Shrimp [Shellfish Allergy] Other (See Comments)    Swelling and hypersensitivity    Outpatient Encounter Medications as of 12/31/2018  Medication Sig  . cetirizine (ZYRTEC) 10 MG tablet Take 10 mg by mouth at bedtime.  Marland Kitchen ezetimibe (ZETIA) 10 MG tablet 1 by mouth daily  . ferrous sulfate 325 (65 FE) MG tablet Take 1 tablet (325 mg total) by mouth 2 (two) times daily with a meal.  . glucose blood test strip Use as instructed  . levETIRAcetam (KEPPRA) 500 MG tablet TAKE 1 TABLET BY MOUTH EVERY 12 HOURS FOR SEIZURES  . lisinopril (ZESTRIL) 30 MG tablet TAKE 1 TABLET BY MOUTH DAILY FOR BLOOD PRESSURE  . metFORMIN (GLUCOPHAGE) 1000 MG tablet TAKE 1 TABLET BY MOUTH 2 TIMES DAILY WITH MEALS TO CONTROL BLOOD SUGAR   No facility-administered encounter medications on file as of 12/31/2018.     Review of Systems  Constitutional: Negative for appetite change, chills, fatigue and fever.  Respiratory: Negative for chest tightness, shortness of breath and wheezing.   Cardiovascular: Negative for chest pain, palpitations and leg swelling.  Gastrointestinal: Negative for constipation, nausea and vomiting.       Tenderness on mid pubic and navel area. Stool always watery.saw blood in the stool and wiping herself.   Genitourinary: Negative for difficulty urinating, dysuria, flank pain, frequency and urgency.  Skin: Negative for color change, pallor and rash.  Neurological: Negative for dizziness, light-headedness and headaches.    Immunization History  Administered Date(s) Administered  . Influenza Split 03/31/2013  . Influenza, High Dose Seasonal PF 04/16/2016  . Influenza-Unspecified  03/26/2015, 03/25/2018  . Pneumococcal Conjugate-13 07/09/2012, 05/19/2014  . Pneumococcal Polysaccharide-23 09/26/2016  . Tdap 06/25/2009   Pertinent  Health Maintenance Due  Topic Date Due  . OPHTHALMOLOGY EXAM  09/09/1965  . COLONOSCOPY  09/09/2005  . MAMMOGRAM  05/19/2011  . FOOT EXAM  09/18/2017  . PAP SMEAR-Modifier  08/10/2018  . INFLUENZA VACCINE  01/24/2019  . HEMOGLOBIN A1C  04/16/2019   Fall Risk  12/31/2018 10/08/2018 06/20/2018 12/25/2017 09/24/2017  Falls in the past year? 0 0 0 No No  Number falls in past yr: - 0 0 - -  Injury with Fall? - 0 0 - -   There were no vitals filed for this visit. There is no height or weight on file to calculate BMI. Physical Exam Unable to complete on the telephone visit.   Labs reviewed: Recent Labs    01/16/18 1013 06/20/18 1615 10/15/18 0806  NA 138 140 140  K 3.5 4.0 4.2  CL 102 109 107  CO2 26 22 25   GLUCOSE 95 131 97  BUN 7* 10 12  CREATININE 0.85  1.01* 0.93  CALCIUM 8.1* 9.6 9.7   Recent Labs    06/20/18 1615 10/15/18 0806  AST 17 19  ALT 14 16  BILITOT 0.3 0.3  PROT 7.1 7.1   Recent Labs    01/20/18 0655 06/20/18 1615 10/15/18 0806  WBC 9.1 6.1 6.4  NEUTROABS  --  2,684 2,950  HGB 7.5* 11.2* 12.2  HCT 24.8* 33.4* 37.1  MCV 81.0 77.1* 78.9*  PLT 345 320 296   Lab Results  Component Value Date   TSH 1.29 12/16/2017   Lab Results  Component Value Date   HGBA1C 6.4 (H) 10/15/2018   Lab Results  Component Value Date   CHOL 207 (H) 10/15/2018   HDL 62 10/15/2018   LDLCALC 120 (H) 10/15/2018   TRIG 139 10/15/2018   CHOLHDL 3.3 10/15/2018    Significant Diagnostic Results in last 30 days:  No results found.  Assessment/Plan 1. Bloody stool Had x 1 yesterday.No abdominal pain or cramping. - Fecal Globin By Immunochemistry - CBC with Differential/Platelet; Future - Ambulatory referral to Gastroenterology  2. Diarrhea, unspecified type Chronic.watery diarrhea with recent blood noted.no  abdominal pain or bloating  - CBC with Differential/Platelet; Future - Ambulatory referral to Gastroenterology  Family/ staff Communication: Reviewed plan of care with patient.   Labs/tests ordered:  - Fecal Globin By Immunochemistry - CBC with Differential/Platelet; Future  Spent 13 minutes of non-face to face visit with patient.  Sandrea Hughs, NP

## 2019-01-01 ENCOUNTER — Other Ambulatory Visit: Payer: Self-pay

## 2019-01-01 ENCOUNTER — Other Ambulatory Visit: Payer: 59

## 2019-01-01 DIAGNOSIS — R197 Diarrhea, unspecified: Secondary | ICD-10-CM

## 2019-01-01 DIAGNOSIS — K921 Melena: Secondary | ICD-10-CM | POA: Diagnosis not present

## 2019-01-01 LAB — CBC WITH DIFFERENTIAL/PLATELET
Absolute Monocytes: 429 cells/uL (ref 200–950)
Basophils Absolute: 70 cells/uL (ref 0–200)
Basophils Relative: 1.2 %
Eosinophils Absolute: 249 cells/uL (ref 15–500)
Eosinophils Relative: 4.3 %
HCT: 36.5 % (ref 35.0–45.0)
Hemoglobin: 11.6 g/dL — ABNORMAL LOW (ref 11.7–15.5)
Lymphs Abs: 2442 cells/uL (ref 850–3900)
MCH: 26 pg — ABNORMAL LOW (ref 27.0–33.0)
MCHC: 31.8 g/dL — ABNORMAL LOW (ref 32.0–36.0)
MCV: 81.8 fL (ref 80.0–100.0)
MPV: 10.6 fL (ref 7.5–12.5)
Monocytes Relative: 7.4 %
Neutro Abs: 2610 cells/uL (ref 1500–7800)
Neutrophils Relative %: 45 %
Platelets: 296 10*3/uL (ref 140–400)
RBC: 4.46 10*6/uL (ref 3.80–5.10)
RDW: 14.2 % (ref 11.0–15.0)
Total Lymphocyte: 42.1 %
WBC: 5.8 10*3/uL (ref 3.8–10.8)

## 2019-01-05 MED FILL — levETIRAcetam 500 MG TABS: 500 | 90 days supply | Qty: 180 | Fill #0

## 2019-01-15 LAB — FECAL GLOBIN BY IMMUNOCHEMISTRY

## 2019-01-20 MED FILL — LISINOPRIL 30 MG TABS: 30 | 90 days supply | Qty: 90 | Fill #0

## 2019-01-27 ENCOUNTER — Telehealth: Payer: Self-pay

## 2019-01-27 ENCOUNTER — Other Ambulatory Visit: Payer: Self-pay

## 2019-01-27 DIAGNOSIS — K921 Melena: Secondary | ICD-10-CM

## 2019-01-27 NOTE — Telephone Encounter (Signed)
Test Orders placed and routed to provider to review and approve.

## 2019-01-27 NOTE — Telephone Encounter (Signed)
Contacted patient and informed them of need for recollection. Patient stated she would like to come to the office to pick up kit.  Filled out information and is awaiting pickup.

## 2019-01-27 NOTE — Telephone Encounter (Signed)
It appears Dinah reviewed cancellation report on 01/20/2019

## 2019-01-27 NOTE — Telephone Encounter (Signed)
May recollect stool for fecal globin test

## 2019-01-27 NOTE — Telephone Encounter (Signed)
Per [facility] lab tech patient's Fecal Globin test was not processed due to not enough specimen collected.  Please advise if you would like for patient to repeat test   Message routed to ordering provider and her medical assistant: Marlowe Sax, NP and [facility]/CMA

## 2019-01-28 NOTE — Telephone Encounter (Signed)
Thanks

## 2019-01-28 NOTE — Telephone Encounter (Signed)
Patient came by yesterday afternoon and picked up ifob kit. Awaiting patient to return for further results.  

## 2019-02-02 IMAGING — CT CT ABD-PELV W/ CM
2 of 4 series · 14 of 46 positions shown, 16 images · IV contrast (iopamidol)
Comparison: 12/25/2017 and 12/17/2017

CLINICAL DATA: Presentation on 12/17/2017 with what appeared to
represent ruptured appendicitis with focal abscess. Status post
percutaneous drainage of right pelvic fluid collection on
12/19/2017. The drain was exchanged and upsized on 01/02/2018 due to
CT showing lack of resolution of the abscess. There has been
persistent drainage of approximately 15-20 mL of light greenish
colored fluid daily from the drain.

EXAM:
CT ABDOMEN AND PELVIS WITH CONTRAST
TECHNIQUE: Multidetector CT imaging of the abdomen and pelvis was performed
using the standard protocol following bolus administration of
intravenous contrast.
CONTRAST:  100mL FFTYRY-FUU IOPAMIDOL (FFTYRY-FUU) INJECTION 61%

[Series 2: abd pelvis 5.00 br40 s3 ax · axial · 0.66mm/px · z∈[+1084,+1439]mm · 11 of 87 slices shown, 13 images]
[im 8/87  soft-tissue]
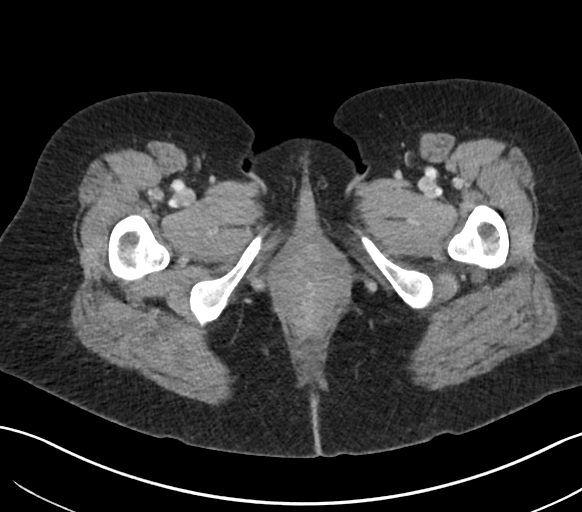
[im 8/87  bone]
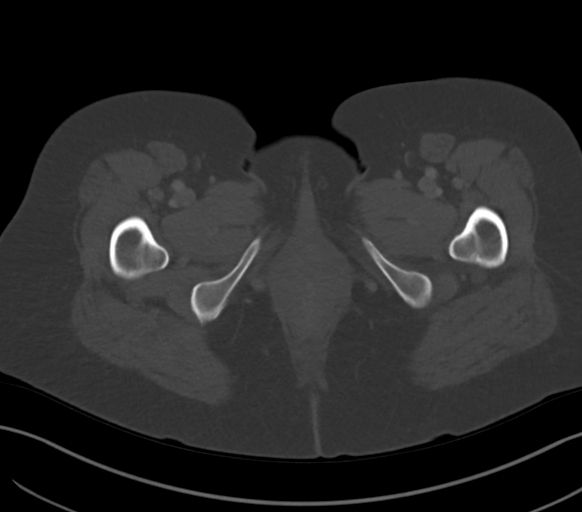
[im 15/87  soft-tissue]
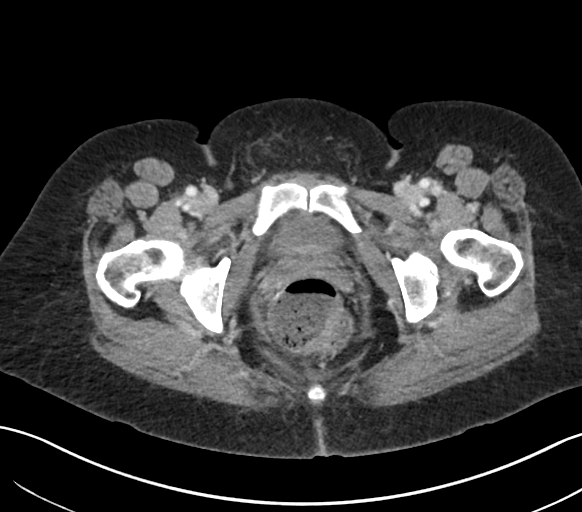
[im 22/87  soft-tissue]
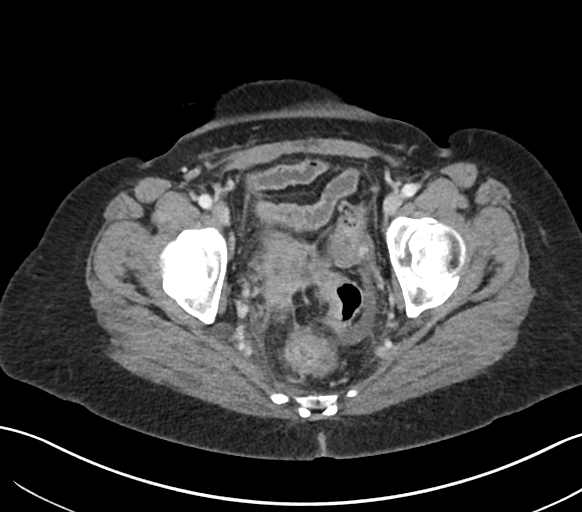
[im 29/87  soft-tissue]
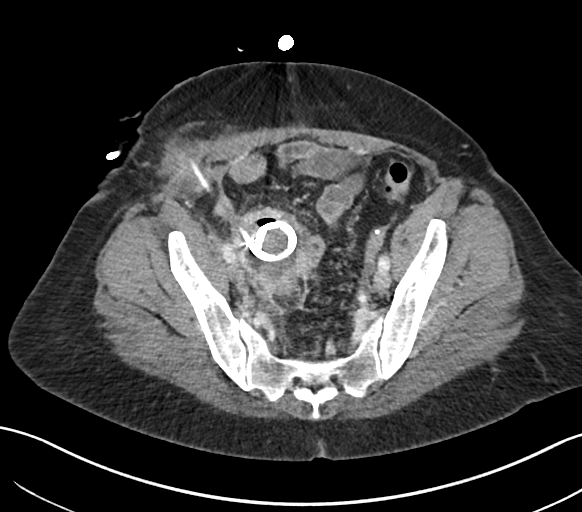
[im 36/87  soft-tissue]
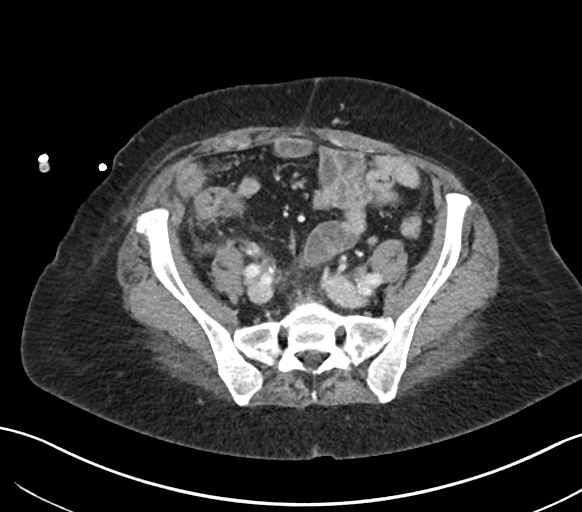
[im 44/87  soft-tissue]
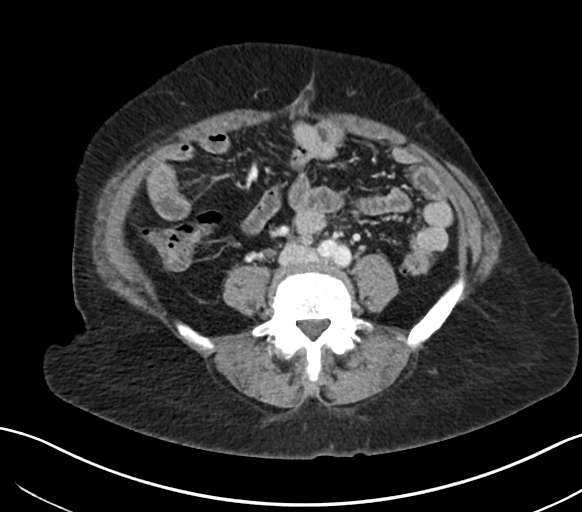
[im 51/87  soft-tissue]
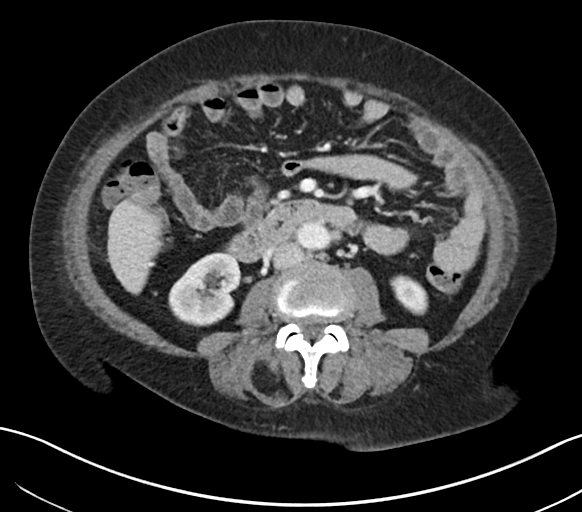
[im 58/87  soft-tissue]
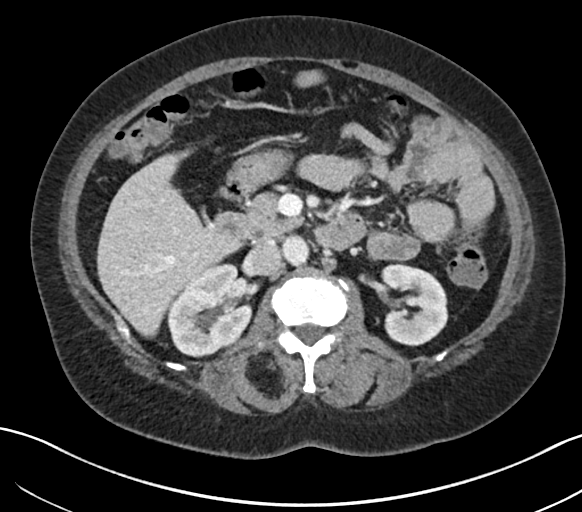
[im 65/87  soft-tissue]
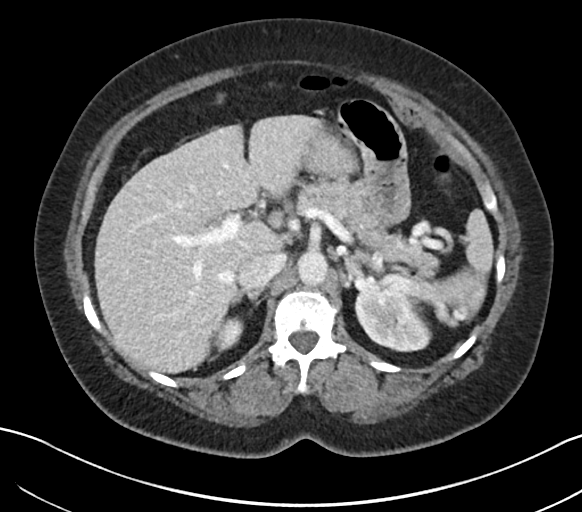
[im 65/87  bone]
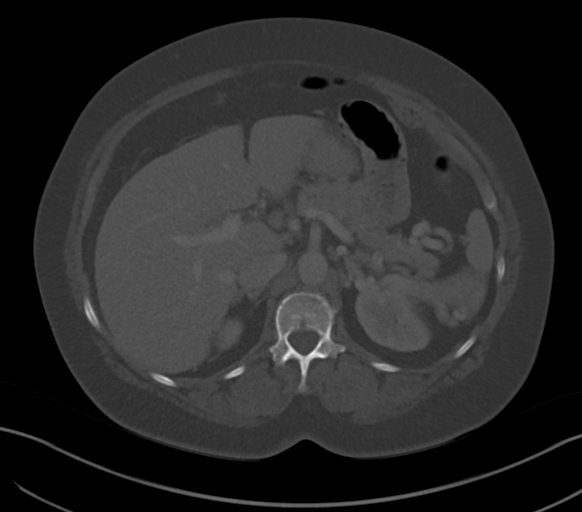
[im 72/87  soft-tissue]
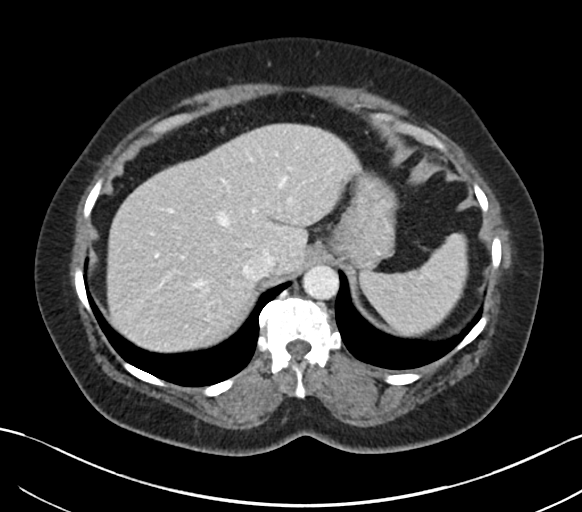
[im 79/87  soft-tissue]
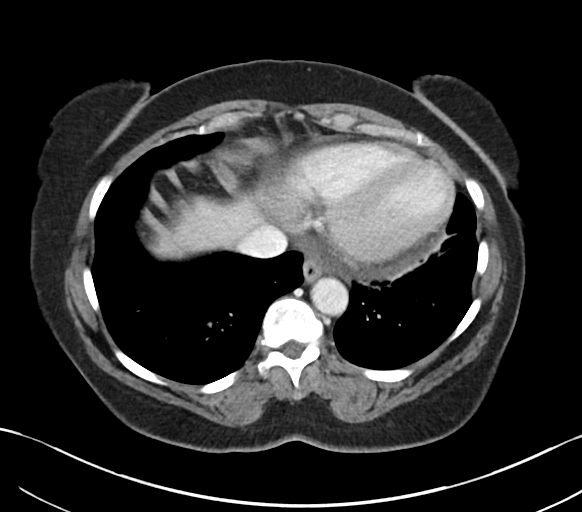

[Series 6: abd pelvis 2.00 br40 s3 cor · coronal · 0.75mm/px · 3 of 148 slices shown]
[im 50/148  soft-tissue]
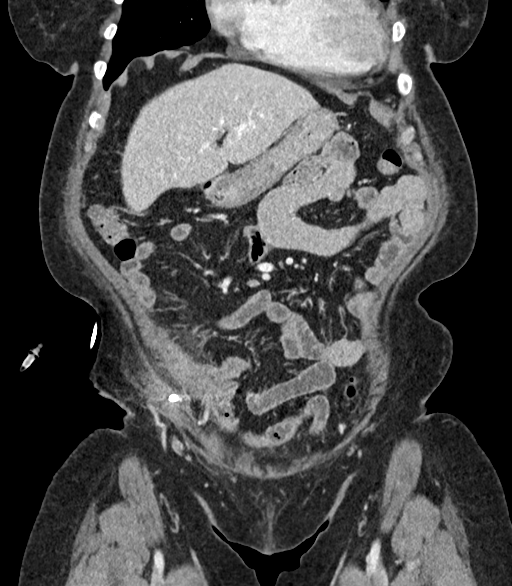
[im 66/148  soft-tissue]
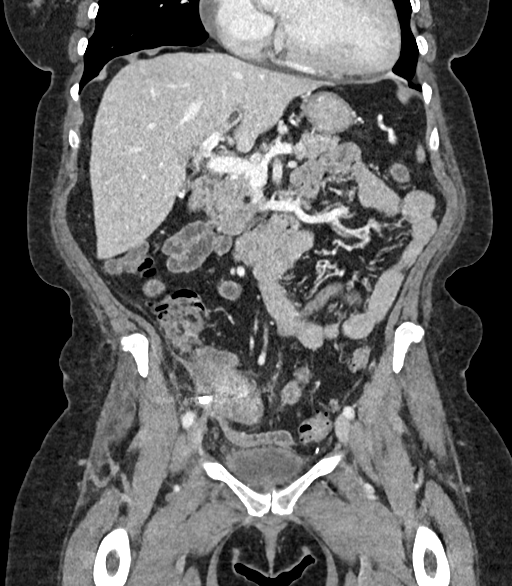
[im 82/148  soft-tissue]
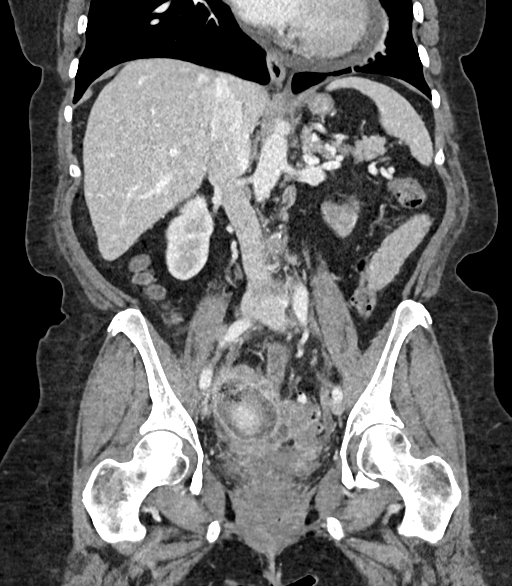

[14 of 46 positions shown; findings below may reference images not displayed]

FINDINGS: Lower chest: No acute abnormality.

Hepatobiliary: No focal liver abnormality is seen. Status post
cholecystectomy. No biliary dilatation.

Pancreas: Unremarkable. No pancreatic ductal dilatation or
surrounding inflammatory changes.

Spleen: Normal in size without focal abnormality.

Adrenals/Urinary Tract: Adrenal glands are unremarkable. Kidneys are
normal, without renal calculi, focal lesion, or hydronephrosis.
Bladder is unremarkable.

Stomach/Bowel: Right pelvic drainage catheter remains in place and
is formed at the level of a well-circumscribed complex fluid
collection. This fluid collection continues to maintain similar
geographic tubular morphology measuring approximately 4.2 cm in
diameter and at least 5.5-6 cm in length. Focal area of increased
density measuring roughly 11 mm again may represent a
fecalith/appendicolith. There is new more diffuse area of increased
density within the collection as well which may represent some
interval hemorrhage or change in nature of the fluid.

The right pelvic fluid collection continues to maintain a very
similar shape to the original scan on 12/17/2017 and has not
decreased significantly in size despite catheter up sizing and
appropriate drain positioning. This raises the possibility that the
catheter is within a structure that is secreting fluid such as a
dilated appendix, appendiceal mucocele or Meckel's diverticulum. No
new abscess identified. No free intraperitoneal air.

Vascular/Lymphatic: No significant vascular findings are present. No
enlarged abdominal or pelvic lymph nodes.

Reproductive: Status post hysterectomy. No adnexal masses.

Other: No abdominal wall hernia.

Musculoskeletal: No acute or significant osseous findings.
IMPRESSION: No significant change in size, shape and morphology of the residual
right pelvic fluid collection. Internal fluid shows some increased
density which may be consistent with some internal hemorrhage. There
remains a roughly 11 mm possible fecalith/appendicolith. Based on
the lack of resolution despite drain exchange and up sizing as well
as persistent tubular shape, differential of more than ruptured
appendicitis would have to be considered. The drain may be directly
within a dilated appendix and an appendiceal mucocele cannot be
entirely excluded. Another possibility would be a Meckel's
diverticulum that became infected.

## 2019-02-10 NOTE — Telephone Encounter (Signed)
Patient received a call from [facility] requesting (per Dr. Manus Rudd. She going through a list of patients that doctor requires to have remote transmission)  for a remote transmission to be sent today. She is confirming that she will sent it today.

## 2019-03-11 ENCOUNTER — Other Ambulatory Visit: Payer: Self-pay | Admitting: Nurse Practitioner

## 2019-03-11 MED FILL — metFORMIN HCL 1000 MG TABS: 1000 | 90 days supply | Qty: 180 | Fill #0

## 2019-04-03 ENCOUNTER — Other Ambulatory Visit: Payer: Self-pay | Admitting: Nurse Practitioner

## 2019-04-03 MED FILL — levETIRAcetam 500 MG TABS: 500 | 90 days supply | Qty: 180 | Fill #0

## 2019-04-10 ENCOUNTER — Ambulatory Visit: Payer: 59 | Admitting: Nurse Practitioner

## 2019-04-16 NOTE — Telephone Encounter (Signed)
Pt scheduled for a Video visit with Dr.Moss on 04/21/19.     Went to ER in [place]  On 04/14/19 with low BP and Irregular HR-recs in [facility]. EKG and Labs.    Pt has manual remote  monitor, last transition was on August 2020. Would Dr.Moss like her to Transmit again to see what happened around that time, since EKG was fine at the ER. If so, when should she transmit?    Please advise    Thank you!

## 2019-04-17 NOTE — Telephone Encounter (Signed)
Pt called this morning to ask if their transmission was received.  Or would they need to send it again?

## 2019-04-21 ENCOUNTER — Ambulatory Visit: Admit: 2019-04-21 | Discharge: 2019-04-22 | Payer: BLUE CROSS/BLUE SHIELD | Attending: Physician | Primary: Physician

## 2019-04-21 DIAGNOSIS — I441 Atrioventricular block, second degree: Secondary | ICD-10-CM

## 2019-04-21 DIAGNOSIS — I480 Paroxysmal atrial fibrillation: Secondary | ICD-10-CM

## 2019-04-21 DIAGNOSIS — I472 Ventricular tachycardia: Secondary | ICD-10-CM

## 2019-04-21 DIAGNOSIS — Z950 Presence of cardiac pacemaker: Secondary | ICD-10-CM

## 2019-04-21 NOTE — Progress Notes (Signed)
This is a shared service.      I performed this consultation using real-time Telehealth tools, including a live video connection between my location and the patient's location. Prior to initiating the consultation, I obtained informed verbal consent to perform this consultation using Telehealth tools and answered all the questions about the Telehealth interaction.    DATE OF VISIT:  04/21/2019      CHIEF COMPLAINTS: Mobitz II AV block status post pacemaker, atrial fibrillation, NSVT     I had the pleasure of seeing Debbie Harper as a telehealth video visit in Cardiac Electrophysiology today for further evaluation of symptomatic bradycardia, status post dual-chamber pacemaker, atrial fibrillation, and NSVT.    Debbie Harper is a 63 y.o. woman with history of thyroid nodule, IBS, and hysterectomy who presented with exertional fatigue and mild shortness of breath, as well as occasional palpitations that awakened her in the middle of the night.  I reviewed old records and/or communicated with other professionals for a total of 30 minutes on 04/21/2019.  This is directly related to a face-to-face visit encounter I personally provided today.    Summary of records review and/or communication:    Exertional symptoms, which started on 08/23/2015 and persisted thereafter, were also sometimes associated with lightheadedness.  She had previously been very active, feeding and riding her horse nearly every day.  Evaluation was notable for a baseline right bundle branch block, and periods of symptomatic 2:1 AV block while reaching peak exercise on a treadmill stress test (see details below).  Holter monitoring also reportedly showed blocked PAC's with resultant bradycardia, though to my review blocked atrial activity was not typically premature (see details below). 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     A dual-chamber device was implanted without complication on 08/28/91.  Thereafter, she noticed significant improvement in her symptoms, but had one episode very similar to her pre-pacemaker symptoms on 11/18/2015 and several milder episodes afterwards aborted by resting.  Device function was normal -- at follow-up 11/2015, she paced 5% in the RA and 1% in the RV.  She underwent breast augmentation surgery on 04/18/2016.     At her visit in April 2018, she reported having a "wide complex tachycardia" for 5 minutes (up to 115 bpm) after a colonoscopy 06/2016.  She also started having episodes of palpitations in February and specifically recalled symptomatic episodes on 2/12 and 2/24 -- the latter correlated with a >1 hour episode of atrial fibrillation recorded by the device.  She was started on aspirin 81 mg daily and recommended to take metoprolol 12.5 mg twice daily, though opted not to take it.     At her visit in 03/2017, she reported shortness of breath with exertion (such as walking up stairs) and wondered if there could be a functional issue with her heart -- she actually contacted me prior to the visit to request that an echocardiogram be scheduled the same day.  The echocardiogram showed normal LV function and no hemodynamically significant valvular disease.  Device interrogation showed 62 minutes of atrial fibrillation between April and October 2018, with the longest episode lasting 11:30 minutes on 01/01/2017.  Rate histograms showed a shift to the left, with atrial sensing or pacing in the 60s more than 50% of the time and only rare atrial sensing above 90 bpm.  We agreed to try adding rate-response  to her device, and to continue conservative management of the rare atrial fibrillation.  The rate-response was later disabled because she was feeling "jittery."   Device check in 06/2017 showed 0.1% atrial high rate detection (10 episodes), with the longest 53 minutes.   Device check in 10/2017  showed 0.1% atrial high rate detection, with the longest episode 35 minutes.  There was also a 20 beat episode of NSVT recorded on 09/17/2017, correlating with an episode of lightheadedness (see my note dated 11/15/2017).  I recommended she try taking metoprolol 12.5 mg twice daily and consider cardiac MRI and/or PET (particularly given the paroxysmal AV block and atrial fibrillation).   Cardiac MRI in 12/2017 revealed mid-wall and subepicardial late gadolinium enhancement in the lateral wall at the level of the base.  A follow-up PET CT scan showed no abnormal FDG uptake and no resting perfusion defects.   She contacted me 03/19/2018 to report some chest pain "off and on" starting the day before and sent remote device transmissions.   At her visit in 04/2018, she was feeling well overall. She had occasional symptoms that corresponded to atrial fibrillation, though she did not feel it was impacting her life significantly. She denied any additional lightheadedness. She preferred to continue metoprolol for atrial fibrillation.    She had a follow-up echocardiogram in early May 2020 showing normal LV function (details below).  Device interrogations continued to show an overall low burden of atrial fibrillation.   At follow-up 11/18/2018, she was feeing "pretty good". She had some sensation of an irregular heart beat at night affecting sleep. We increased her metoprolol evening dose. Device interrogation showed rare brief NSVT.    On 04/14/2019, she presented to the ED at [facility] with episodes of low blood pressure of 66M systolic at home over the prior several days and some "off or blurry" vision. She reportedly had no focal neurologic deficits, and per records from her hospitalization, it was not felt symptoms were consistent with stroke. She was treated with IV fluids and discharged from ED.    Remote device interrogation 04/16/2019 showed normal function, with 106 episodes of atrial fibrillation since  02/10/2019 (burden 1.7%).  The longest formally measured episode was 3 hours 40 min on 03/11/2019, though based on the log I suspect she was continuously in atrial fibrillation from about midnight until 9 am on 04/13/2019; average ventricular rates were in the 80s.    Today, Debbie Harper is "hanging in there."  During the recent atrial fibrillation episode, she felt an irregular heart rate, but not a "super high" pulse.  She did not take her metoprolol, being concerned about lowering her blood pressure further.  The lowest pressure was 84/58 and associated with lightheadedness, which started "well into" the episode of atrial fibrillation (not before).  However, it persisted until later in the day on 10/10, despite the atrial fibrillation having stopped that morning.    She continues to have some shortness of breath with exertion, particularly at altitude, forcing her to rest more than she feels she should have to.  She wonders if that is a cardiac or a pulmonary issue.  Notably, we previously tried enabling the rate-response feature on her pacemaker back in 03/2017, but she felt anxious and jittery, so it was programmed off.  She inquires whether increasing her physical activity and improving stamina could improve her overall heart function, and also asked about any benefit to genetic testing (like they do in the [facility]).  She denies orthopnea, paroxysmal nocturnal dyspnea, lower extremity edema, presyncope, or syncope.    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 - Oral    Class: Historical Med    estradiol (ESTRING) 2 mg (7.5 mcg /24 hour) vaginal ring        Sig - Route: Place vaginally every 3 (three) months. Use as instructed - Vaginal    Class: Historical Med    metoprolol tartrate (LOPRESSOR) 25 mg tablet        Sig - Route: Take 12.5 mg by mouth 2 (two) times daily Take 1/2 tab in AM and 1 tab in PM   - Oral    Class: Historical Med          ALLERGIES     Allergies as of 04/17/2019  Review status set to Review Complete by Virgel Bouquet, MD on 11/18/2018      Severity Noted Reaction Type Reactions    Sulfamethoxazole-trimethoprim Not Specified 10/18/2015    Itching    Insomnia          SOCIAL HISTORY     She reports that she has never smoked. She has never used smokeless tobacco. She reports current alcohol use of about 1.0 standard drinks of alcohol per week. She reports that she does not use drugs.    She is divorced.  She has 3 children.  She lives in [place], [place].  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    She has 2 horses (a Engineer, water and a [place], ages 13 and 57).  She also wakeboards in the summer.    FAMILY HISTORY     Her father underwent CABG in his late 75's and has cardiomyopathy.  He was a heavy smoker. He passed away in early 09/12/18.    Her mother had a heart attack around age 5.  She died after complications from a head injury in June 2016 after being knocked down by a horse.  Her grandfather had colon cancer.    REVIEW OF SYSTEMS     Constitutional:    No fevers, chills, or diaphoresis.  No unexpected weight change.   Skin:  No rashes.   Eyes:  No swelling, pain, or redness.  No photophobia or visual disturbance.  + reading glasses.  + fuzzy vision sometimes (over the past several years).  HENT:  No neck pain or stiffness.  No hearing loss or tinnitus.  No nosebleeds.   Respiratory:  No shortness of breath at rest.  + dyspnea on exertion   Cardiovascular:  As detailed above.  Gastrointestinal:  +  IBS with cramping and gas.  No blood in the stool.  + polyps on colonoscopy in 09/12/13.  Genitourinary:  No difficulty urinating, dysuria, or hematuria.   Neurological:  +  lightheadedness recently associated with atrial fibrillation and borderline BPs.  No vertigo or syncope.  + occasional headaches.  No facial asymmetry or speech difficulty.  + occasional tingling left hand.  No focal weakness.  No seizures.  Hematological:  + easy bruising on backs of hands since 2012.  Musculoskeletal:  No arthralgias or joint swelling.      Psychiatric:  No change in concentration.  No confusion.  No agitation or anxiety.     PHYSICAL EXAMINATION     Constitutional: Patient appears well-developed and well-nourished. Pleasant and appropriately interactive.  Head: Normocephalic and atraumatic.   Eyes: Conjunctivae and EOM are normal.   Neck: Normal range of motion.   Pulmonary/Chest: Effort normal. No respiratory distress. No cough.  Abdomen: No obvious abdominal distension.  Neurological: Alert and oriented to person, place, and time.   Psychiatric: Normal mood and affect. Behavior is normal. Judgment and thought content normal.   Skin: No visible rash.     DATA     I reviewed prior 12-lead EKGs:  - 04/02/2017: competing sinus rhythm and atrial pacing at 60 bpm with intact AV conduction and right bundle branch block (QRSd 124 msec).  - 10/02/2016: atrial pacing at 61 bpm with intact AV conduction and right bundle branch block  - 11/29/2015: normal sinus rhythm with right bundle branch block at 64 bpm.  - 10/18/2015: sinus bradycardia at 57 bpm with right bundle branch block  - 08/29/2015: sinus bradycardia at 57 bpm with right bundle branch block.    Remote pacemaker interrogation 04/16/2019 showed normal overall function, with 48% atrial pacing and 2.9% ventricular pacing.  Total atrial fibrillation burden was 1.7% since 01/2019, with longest episode 3 hours 40 minutes on 03/11/2019.  There were multiple episodes 10/18-10/19/2020.  The  longest episode within that time period measured formally was 1 h 20 min, though based on the log I suspect she was continuously in atrial fibrillation from ~midnight until ~9am on 04/13/2019.    Remote pacemaker interrogation 11/18/2018 (see separate full report in [facility]):    - She paces 45.4% in the RA (stable) and 4.6% in the RV (stable).    - Atrial fibrillation burden has been 0.9%, the longest episode lasting 5 hours on 11/07/2018 with max V-rate of 130 bpm (max V-rate during all atrial fibrillation episodes 133 bpm)  - No NSVT recorded  - Many longer episodes were overnight      I reviewed the report and tracings from a 24-hour Holter monitor 08/31/2015 [facility]):  - Average HR 64.  Minimum 38.  Maximum 97.  Predominant rhythm sinus.  - Single PVC's occasional; no runs.  - Frequent blocked PACs with resultant bradycardia.  No SVT or atrial fibrillation.    - Occasional shortness of breath during periods of bradycardia with blocked PACs.  - On my review of tracings, several strips appear more consistent with sinus rhythm with 2:1 block than with blocked PAC's based on timing (though P-wave morphology is subtly different with blocked beats).  On tracings with both 2:1 and 1:1 conduction, the P-wave CL remains constant and ventricular rate exactly 1/2 during 2:1 block.  When transitioning from 1:1 to 2:1 block, the first blocked P-wave comes slightly early, but possibly due to increase in sinus rate.  - Less frequently (particularly 2:26am), bradycardia appears due to blocked PAC's in bigeminy.  - One period of 3:2 conduction (9:28am) without clear PR prolongation    I reviewed the reports of the following tests:  Echocardiogram 10/31/2018 ([place]):       Cardiac PET 02/17/2018:  1.   Normal perfusion in the left ventricle without myocardial uptake of FDG. Findings are suggestive of no active myocardial inflammatory process or scar.  2.    Normal left ventricular wall motion with a  calculated left ventricular ejection fraction of >65% on rest perfusion images.     Cardiac MRI 12/31/2017:  1. Mid wall and subepicardial late gadolinium enhancement in the lateral wall at the level of the base which can be seen with myocarditis or other inflammatory cardiomyopathy. Remaining myocardium demonstrates no evidence of scar.  2. Normal left ventricular function (ejection fraction 73%).     Echocardiogram 04/02/2017:  The patient's blood pressure was 154 mmHg/70 mmHg during the study. The heart rate during the study was 73. Color Flow Doppler was utilized for this exam. Spectral Doppler was utilized for this exam.  1. The left ventricular volume is normal. LV function is hyperdynamic. LV ejection fraction is estimated to be 70 to 75%.  2. The right ventricular volume is normal. Right ventricular function is normal.  3. Left atrial size is normal. Right atrial size is normal.  4. There is no hemodynamically significant valvular disease.  5. There is no Doppler evidence of LV diastolic dysfunction.  6. The RV systolic pressure is estimated to be 23 mmHg based on a right atrial pressure of 3 mmHg.  7. No pericardial effusion noted. The inferior vena cava is less than 21 mm in diameter and collapses with inspiration consistent with a right atrial pressure of 3 mmHg.  8. Aortic root dimension is normal.  - Compared to the previous study on 09/23/15, the prior study is a stress echo.     Exercise stress echocardiogram 09/23/2015 [facility]):  - Resting EKG sinus rhythm with right bundle branch block   - Resting echocardiogram: Normal LV size, wall thickness, and function, EF 60-65%.  Atria normal size.  RV normal.  Mild anterior leaflet MVP and mild MR.  No other significant valvular pathology.  No pericardial effusion.  - Walked 8 min 11 sec on [place], with peak HR 118 bpm early but development of 2:1 AV block at peak exercise with HR in the 60s and associated shortness of breath (2:1 block versus  atrial bigeminy with block).  Returned to 1:1 conduction in recovery.  Episodes of supraventricular bigeminy noted.  - I reviewed the tracings:  - At 2:24, there is a period of 3:2 conduction with constant P-P interval and no clear PR prolongation, that appears to persist through 5:50  - HR then drops from 89 to 74 but P-waves difficult to see  - At 6:50 there appears to be 2:1 block with occasional outflow tract PVC's  - At 7:50 there is suspicion for 2:1 conduction with blocked P-waves in prior T-waves.  - At 15 seconds into recovery there are periods with 1:1 conduction at double the rate of final exercise EKGs (5:4 and 6:5 conduction, but with little to no change in PR interval and R-R interval around blocked P-wave exactly double that of conducted intervals).  This apparent Mobitz type II second degree AV block becomes more apparent as the rate continues to slow in recovery.  - By 2 minutes into recovery there is clear 2:1 AV block that persists to 3 minutes, though with one episode of 3:2 block that appears slightly more consistent with Wenckebach based on the PR intervals and length of pause  -  Stress echocardiogram:  Appropriate increase in contractility of all segments; no evidence of ischemia.     Chest CTA 09/22/2015 [facility]):  - Negative for PE or other explanation for shortness of breath.  - Few liver lesions, probably cysts.    - Ascending and descending aorta normal caliber     PFTs 09/08/2015:  - FVC, FEV1, FEV1/FVC ratio, and FEF25-75% within normal limits  - TLC within normal limits  - FRC and RV increased  - With bronchodilators, there is significant response.  - Diffusing capacity high.  - Conclusions: Increased diffusing capacity consistent with left heart failure or shunting.  Although flow rates are within normal limits, the overinflation and response to bronchodilators are characteristic of reactive airways ("minimal")    I reviewed laboratory evaluation from [facility] dated  04/14/2019:  Na 141, K 3.8, Cl 105, CO2 32, BUN 17, Cr 0.6, Glu 100  LFTs normal (alk phos low at 44)  WBC 3.7, Hb 13.5, Plt 143  TnI <0.012  nT-BNP 238  U/A negative      This is a shared visit for services provided by me, Virgel Bouquet, MD. I performed a face-to-face video encounter with the patient and the following portion of the note is my own.      I also edited and amended the above note, and I personally reviewed the EKG and pertinent laboratory and imaging studies.    ASSESSMENT AND RECOMMENDATIONS     In summary, Debbie Harper is a 63 y.o. 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  She was subsequently diagnosed with paroxysmal atrial fibrillation, as well as occasional NSVT.  Imaging evaluation showed mid-wall and epicardial LGE at the lateral base, but no perfusion defects or evidence of abnormal FDG uptake on subsequent PET scanning.    We focused our evaluation and discussion today on the following problems:    1) Atrial fibrillation:  - She had a prior history of relatively infrequent palpitations and has been diagnosed with paroxysmal atrial fibrillation via device interrogation.  The burden has increased slightly but remains at <2%.  Last week she had lightheadedness associated with relatively low blood pressures during an overnight episode, with some residual symptoms for several hours after converting back to sinus rhythm.  - I have previously discussed the pathophysiology of atrial fibrillation and multiple management options with Debbie Harper. Given her associated symptoms, I would recommend that efforts be directed to maintaining sinus rhythm. She would be a good candidate for either a trial of antiarrhythmic drug therapy or catheter ablation.    After discussion, Debbie Harper expressed more interest in catheter  ablation than adding another medication to her regimen, but she does not ready to proceed at this time.  - She will continue low-dose metoprolol to try to prevent episodes (and I confirmed that it makes sense to hold a dose when her blood pressure is low).  - She will follow-up in 6 months, with continued remote device checks in the interim.  She knows to contact me sooner should episodes become more frequent and/or persistent so that we can revisit rhythm-control strategies.  - She knows to stay well hydrated.  - I encouraged her to gradually build up her exercise regimen to see if overall stamina and exercise tolerance can be improved.    2) Stroke  prevention:  - Her CHA2DS2-VASc score is 1, for gender only.  This corresponds to a yearly stroke risk of 1.5% with no therapy, 1.2% with aspirin alone, and 0.3-0.5% on therapeutic anticoagulation.  - Given the low burden, aspirin alone remains reasonable for now.  However, I have previously advised her that my general preference is a more aggressive approach to anticoagulation given the safety of the newer medications and the potentially devastating effects of stroke -- should she have significantly increasing burden, we may re-visit the potential risks and benefits of NOAC therapy.  Should she decide to proceed with ablation, she will require anticoagulation before and after the procedure.    3) Mobitz type II second degree AV block with symptomatic bradycardia, status post pacemaker:  - The device is functioning normally based on remote interrogation.  Her ventricular pacing burden has remained low overall.  - She will continue routine pacemaker checks remotely and in the clinic.    4) Possible sinus node dysfunction:  - She has dyspnea on exertion, and rate histograms show overall slower sinus rates and increased atrial pacing burden.    - She may have some degree of chronotropic incompetence that contributes to exertional dyspnea, but felt poorly with rate-response  enabled.  We could consider another trial of rate-response with less aggressive settings in the future.    5) NSVT:  - She had a symptomatic, 20-beat run of NSVT in 08/2017 associated with significant lightheadedness.  We initiated the metoprolol after that, and she has since had minimal brief NSVT.  - Given the combination of AV block, sinus node dysfunction, paroxysmal atrial fibrillation, and NSVT, I suggested further evaluation for inflammatory cardiomyopathy such as sarcoid.  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  The etiology of the LGE is not clear -- perhaps she had a prior episode of myocarditis.    - The combination of the LGE and NSVT is obviously concerning, though she has normal LV function, no sustained ventricular arrhythmias, and no syncope.  Recent lightheadedness was clearly associated with an episode of atrial fibrillation. Since starting low dose metoprolol she has had minimal NSVT. I do not think there is an indication for ICD upgrade at this time.       It is a pleasure participating in Debbie Harper's care.  I spent a total of 40 minutes reviewing records/data and face-to-face with the patient; >50% of that time was spent counseling regarding the symptoms, treatment plan, risks and/or therapeutic options for the diagnoses above.    Please do not hesitate to contact me with any questions or concerns.    Estrellita Ludwig. Debbie Rudd, MD, [facility], [facility]  Associate Professor of Clinical Medicine  Division of Cardiology  Cardiac Electrophysiology

## 2019-04-22 ENCOUNTER — Other Ambulatory Visit: Payer: Self-pay | Admitting: Nurse Practitioner

## 2019-04-22 DIAGNOSIS — E1169 Type 2 diabetes mellitus with other specified complication: Secondary | ICD-10-CM

## 2019-04-22 DIAGNOSIS — I10 Essential (primary) hypertension: Secondary | ICD-10-CM

## 2019-04-22 DIAGNOSIS — E785 Hyperlipidemia, unspecified: Secondary | ICD-10-CM

## 2019-04-22 MED FILL — EZETIMIBE 10 MG TABS: 10 | 30 days supply | Qty: 30 | Fill #0

## 2019-04-22 MED FILL — LISINOPRIL 30 MG TABS: 30 | 30 days supply | Qty: 30 | Fill #0

## 2019-04-23 ENCOUNTER — Telehealth: Payer: Self-pay | Admitting: Nurse Practitioner

## 2019-04-23 NOTE — Telephone Encounter (Signed)
Left voice message asking patient to call and reschedule the appointment that she missed on 10/16 with Janett Billow

## 2019-05-06 ENCOUNTER — Ambulatory Visit (INDEPENDENT_AMBULATORY_CARE_PROVIDER_SITE_OTHER): Payer: 59 | Admitting: Nurse Practitioner

## 2019-05-06 ENCOUNTER — Encounter: Payer: Self-pay | Admitting: Nurse Practitioner

## 2019-05-06 ENCOUNTER — Other Ambulatory Visit: Payer: Self-pay

## 2019-05-06 VITALS — BP 152/90 | HR 80 | Temp 97.3°F | Ht 61.08 in | Wt 165.2 lb

## 2019-05-06 DIAGNOSIS — D509 Iron deficiency anemia, unspecified: Secondary | ICD-10-CM

## 2019-05-06 DIAGNOSIS — Z1211 Encounter for screening for malignant neoplasm of colon: Secondary | ICD-10-CM

## 2019-05-06 DIAGNOSIS — E1169 Type 2 diabetes mellitus with other specified complication: Secondary | ICD-10-CM

## 2019-05-06 DIAGNOSIS — E119 Type 2 diabetes mellitus without complications: Secondary | ICD-10-CM

## 2019-05-06 DIAGNOSIS — K429 Umbilical hernia without obstruction or gangrene: Secondary | ICD-10-CM

## 2019-05-06 DIAGNOSIS — R197 Diarrhea, unspecified: Secondary | ICD-10-CM

## 2019-05-06 DIAGNOSIS — E785 Hyperlipidemia, unspecified: Secondary | ICD-10-CM

## 2019-05-06 DIAGNOSIS — I10 Essential (primary) hypertension: Secondary | ICD-10-CM | POA: Diagnosis not present

## 2019-05-06 DIAGNOSIS — Z1212 Encounter for screening for malignant neoplasm of rectum: Secondary | ICD-10-CM | POA: Diagnosis not present

## 2019-05-06 NOTE — Patient Instructions (Addendum)
Bring copy of [place] and/or Living Will to next appointment.    Okay to use tylenol 500 mg 1-2 tablets every 8 hours as needed with pain. Can take up to 3000 mg in 24 hours safely

## 2019-05-06 NOTE — Progress Notes (Signed)
Careteam: Patient Care Team: Lauree Chandler, NP as PCP - General (Geriatric Medicine)  Advanced Directive information Does Patient Have a Medical Advance Directive?: Yes, Type of Advance Directive: Living will, Does patient want to make changes to medical advance directive?: No - Patient declined  Allergies  Allergen Reactions   Simvastatin Other (See Comments)    Stomach pain, low back pain    Shrimp [Shellfish Allergy] Other (See Comments)    Swelling and hypersensitivity    Chief Complaint  Patient presents with   Medical Management of Chronic Issues    6 month follow-up. Patient c/o joint pain, pain affects sleep. Patient took tylenol for pain. Patient also c/o swelling at appendectomy surgical site. Patient with ongoing loose stools since summer 2019. Patient questions if sleep issues can interfer with seizure disorder. Patient would like to discuss metformin and information in the news about cancer causing agents.    Quality Metric Gaps    Discuss need for colonoscopy, foot exam, and pap.      HPI: Patient is a 63 y.o. female seen in the office today for routine follow up.  Having ongoing diarrhea and concerned over this.   DM- taking metformin 1000 mg BID, due for A1c, does not check blood sugars at home. No lows or hypoglycemia  Anemia- attempted ifob but unable to complete. Did not wish to do colonoscopy at that time but willing to do this now. Currently on iron daily   Hyperlipidemia- fasting today, LDL not at goal, currently on zetia   Hypertension- elevated today. Does not check blood pressure outside of office. States she rushed over here. Takes lisinopril 30 mg in the morning.    Seizure- no recent seizures, continues on keppra, has not followed up with a neurologist in a long time. If she feels over tired she is worried she will have a seizure.   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 Woke up with pain 2 nights  ago.   Review of Systems:  Review of Systems  Constitutional: Negative for chills, fever and weight loss.  HENT: Negative for tinnitus.   Respiratory: Negative for cough, sputum production and shortness of breath.   Cardiovascular: Negative for chest pain, palpitations and leg swelling.  Gastrointestinal: Positive for diarrhea (loose stool). Negative for abdominal pain, constipation and heartburn.  Genitourinary: Negative for dysuria, frequency and urgency.  Musculoskeletal: Negative for back pain, falls, joint pain (to wrist occasionally) and myalgias.  Skin: Negative.   Neurological: Positive for headaches (occasionally). Negative for dizziness.  Psychiatric/Behavioral: Negative for depression and memory loss. The patient does not have insomnia.     Past Medical History:  Diagnosis Date   Anemia, unspecified    Asthma    as a child, no problem as adult, no inhaler   Diabetes mellitus    Type II   History of blood transfusion    pre hysterectomy   Osteoarthrosis, unspecified whether generalized or localized, unspecified site    Other and unspecified hyperlipidemia    Other convulsions    last one 2013   Other malaise and fatigue    Other specified cardiac dysrhythmias(427.89) 2006   "history of Bradycardia" .  wore a mointor nothing found   Seizure disorder ([place])    Unspecified essential hypertension    Unspecified vitamin D deficiency    Past Surgical History:  Procedure Laterality Date   ABDOMINAL HYSTERECTOMY     BREAST LUMPECTOMY Right 1997  BREAST LUMPECTOMY Bilateral    neg for cancer   CHOLECYSTECTOMY N/A 02/28/2016   Procedure: LAPAROSCOPIC CHOLECYSTECTOMY;  Surgeon: Mickeal Skinner, MD;  Location: [facility];  Service: General;  Laterality: N/A;   IR CATHETER TUBE CHANGE  01/02/2018   IR RADIOLOGIST EVAL & MGMT  12/25/2017   IR RADIOLOGIST EVAL & MGMT  01/08/2018   LAPAROSCOPIC APPENDECTOMY N/A 01/15/2018   Procedure: LAPAROSCOPIC APPENDECTOMY  CONVERTED TO OPEN APPENDECTOMY SMALL BOWEL RESECTION;  Surgeon: Donnie Mesa, MD;  Location: [facility];  Service: General;  Laterality: N/A;   Social History:   reports that she has never smoked. She has never used smokeless tobacco. She reports that she does not drink alcohol or use drugs.  Family History  Problem Relation Age of Onset   Cancer Father        Esophageal    Medications: Patient's Medications  New Prescriptions   No medications on file  Previous Medications   BIOTIN PO    Take 1 tablet by mouth daily.   CETIRIZINE (ZYRTEC) 10 MG TABLET    Take 10 mg by mouth at bedtime.   EZETIMIBE (ZETIA) 10 MG TABLET    Take one tablet by mouth once daily. Needs an appointment before anymore future refills.   FERROUS SULFATE 325 (65 FE) MG TABLET    Take 1 tablet (325 mg total) by mouth 2 (two) times daily with a meal.   GLUCOSE BLOOD TEST STRIP    Use as instructed   LEVETIRACETAM (KEPPRA) 500 MG TABLET    TAKE 1 TABLET BY MOUTH EVERY 12 HOURS FOR SEIZURES   LISINOPRIL (ZESTRIL) 30 MG TABLET    Take one tablet by mouth once daily for blood pressure. Needs appointment before anymore future refills.   METFORMIN (GLUCOPHAGE) 1000 MG TABLET    TAKE 1 TABLET BY MOUTH 2 TIMES DAILY WITH MEALS TO CONTROL BLOOD SUGAR  Modified Medications   No medications on file  Discontinued Medications   No medications on file    Physical Exam:  Vitals:   05/06/19 1519  BP: (!) 152/90  Pulse: 80  Temp: (!) 97.3 F (36.3 C)  TempSrc: Temporal  SpO2: 98%  Weight: 165 lb 3.2 oz (74.9 kg)  Height: 5' 1.08" (1.551 m)   Body mass index is 31.14 kg/m. Wt Readings from Last 3 Encounters:  05/06/19 165 lb 3.2 oz (74.9 kg)  06/20/18 160 lb (72.6 kg)  01/15/18 161 lb (73 kg)    Physical Exam Constitutional:      General: She is not in acute distress.    Appearance: She is well-developed. She is not diaphoretic.  HENT:     Head: Normocephalic and atraumatic.     Mouth/Throat:     Pharynx: No  oropharyngeal exudate.  Eyes:     Conjunctiva/sclera: Conjunctivae normal.     Pupils: Pupils are equal, round, and reactive to light.  Neck:     Musculoskeletal: Normal range of motion and neck supple.  Cardiovascular:     Rate and Rhythm: Normal rate and regular rhythm.     Pulses:          Dorsalis pedis pulses are 2+ on the right side and 2+ on the left side.       Posterior tibial pulses are 2+ on the right side and 2+ on the left side.     Heart sounds: Normal heart sounds.  Pulmonary:     Effort: Pulmonary effort is normal.  Breath sounds: Normal breath sounds.  Abdominal:     General: Bowel sounds are normal. There is no distension.     Palpations: Abdomen is soft. There is no mass.     Tenderness: There is no abdominal tenderness.     Hernia: A hernia is present. Hernia is present in the umbilical area (small easily reducible umbilical hernia).  Musculoskeletal:        General: No tenderness.     Right foot: Normal range of motion.     Left foot: Normal range of motion.  Feet:     Right foot:     Protective Sensation: 3 sites tested. 3 sites sensed.     Skin integrity: Skin integrity normal.     Left foot:     Protective Sensation: 3 sites tested. 3 sites sensed.     Skin integrity: Skin integrity normal.  Skin:    General: Skin is warm and dry.  Neurological:     Mental Status: She is alert and oriented to person, place, and time.     Labs reviewed: Basic Metabolic Panel: Recent Labs    06/20/18 1615 10/15/18 0806  NA 140 140  K 4.0 4.2  CL 109 107  CO2 22 25  GLUCOSE 131 97  BUN 10 12  CREATININE 1.01* 0.93  CALCIUM 9.6 9.7   Liver Function Tests: Recent Labs    06/20/18 1615 10/15/18 0806  AST 17 19  ALT 14 16  BILITOT 0.3 0.3  PROT 7.1 7.1   No results for input(s): LIPASE, AMYLASE in the last 8760 hours. No results for input(s): AMMONIA in the last 8760 hours. CBC: Recent Labs    06/20/18 1615 10/15/18 0806 01/01/19 0802  WBC 6.1  6.4 5.8  NEUTROABS 2,684 2,950 2,610  HGB 11.2* 12.2 11.6*  HCT 33.4* 37.1 36.5  MCV 77.1* 78.9* 81.8  PLT 320 296 296   Lipid Panel: Recent Labs    10/15/18 0806  CHOL 207*  HDL 62  LDLCALC 120*  TRIG 139  CHOLHDL 3.3   TSH: No results for input(s): TSH in the last 8760 hours. A1C: Lab Results  Component Value Date   HGBA1C 6.4 (H) 10/15/2018     Assessment/Plan 1. Diarrhea, unspecified type -persistent, agreeable to GI referral at this time for evaluation and screening colonoscopy.  - CBC with Differential/Platelet - COMPLETE METABOLIC PANEL WITH GFR  2. Hyperlipidemia associated with type 2 diabetes mellitus ([place]) -on zetia only. Discussed goal LDL <70, agreeable to low dose crestor if needed to get her to goal based on recent labs.  - COMPLETE METABOLIC PANEL WITH GFR - Lipid Panel  3. Controlled type 2 diabetes mellitus without complication, without long-term current use of insulin ([facility]) -has not check blood sugars recently. No hypoglycemia noted. Continues on metformin 1000 mg BID - Hemoglobin A1c  4. Iron deficiency anemia, unspecified iron deficiency anemia type -chronic anemia. Continues on iron supplement - CBC with Differential/Platelet  5. Encounter for colorectal cancer screening - Ambulatory referral to Gastroenterology  6. Essential hypertension -elevated today, states she came right from work. Taking lisinopril 30 mg. Encouraged dietary modifications. To call if blood pressure remains elevated at home. She plans to check over the next few days. Goal bp <140/90   7. Umbilical hernia without obstruction and without gangrene -small and easily reducible. Education provided on when to seek immediate attention.  Next appt: 6 months, sooner if needed for increased BP Ariellah Faust K. Harle Battiest  [employer]  [facility] [phone]

## 2019-05-07 ENCOUNTER — Other Ambulatory Visit: Payer: Self-pay | Admitting: Nurse Practitioner

## 2019-05-07 LAB — CBC WITH DIFFERENTIAL/PLATELET
Absolute Monocytes: 390 cells/uL (ref 200–950)
Basophils Absolute: 72 cells/uL (ref 0–200)
Basophils Relative: 1.1 %
Eosinophils Absolute: 280 cells/uL (ref 15–500)
Eosinophils Relative: 4.3 %
HCT: 37.5 % (ref 35.0–45.0)
Hemoglobin: 12 g/dL (ref 11.7–15.5)
Lymphs Abs: 2932 cells/uL (ref 850–3900)
MCH: 25.8 pg — ABNORMAL LOW (ref 27.0–33.0)
MCHC: 32 g/dL (ref 32.0–36.0)
MCV: 80.5 fL (ref 80.0–100.0)
MPV: 10.5 fL (ref 7.5–12.5)
Monocytes Relative: 6 %
Neutro Abs: 2828 cells/uL (ref 1500–7800)
Neutrophils Relative %: 43.5 %
Platelets: 276 10*3/uL (ref 140–400)
RBC: 4.66 10*6/uL (ref 3.80–5.10)
RDW: 14 % (ref 11.0–15.0)
Total Lymphocyte: 45.1 %
WBC: 6.5 10*3/uL (ref 3.8–10.8)

## 2019-05-07 LAB — COMPLETE METABOLIC PANEL WITH GFR
AG Ratio: 1.9 (calc) (ref 1.0–2.5)
ALT: 14 U/L (ref 6–29)
AST: 18 U/L (ref 10–35)
Albumin: 4.7 g/dL (ref 3.6–5.1)
Alkaline phosphatase (APISO): 66 U/L (ref 37–153)
BUN: 13 mg/dL (ref 7–25)
CO2: 23 mmol/L (ref 20–32)
Calcium: 10 mg/dL (ref 8.6–10.4)
Chloride: 108 mmol/L (ref 98–110)
Creat: 0.92 mg/dL (ref 0.50–0.99)
GFR, Est African American: 77 mL/min/{1.73_m2} (ref >=60)
GFR, Est Non African American: 66 mL/min/{1.73_m2} (ref >=60)
Globulin: 2.5 g/dL (calc) (ref 1.9–3.7)
Glucose, Bld: 100 mg/dL (ref 65–139)
Potassium: 4.5 mmol/L (ref 3.5–5.3)
Sodium: 139 mmol/L (ref 135–146)
Total Bilirubin: 0.5 mg/dL (ref 0.2–1.2)
Total Protein: 7.2 g/dL (ref 6.1–8.1)

## 2019-05-07 LAB — HEMOGLOBIN A1C
Hgb A1c MFr Bld: 6.4 % of total Hgb — ABNORMAL HIGH (ref <5.7)
Mean Plasma Glucose: 137 (calc)
eAG (mmol/L): 7.6 (calc)

## 2019-05-07 LAB — LIPID PANEL
Cholesterol: 181 mg/dL (ref <200)
HDL: 59 mg/dL (ref >=50)
LDL Cholesterol (Calc): 101 mg/dL (calc) — ABNORMAL HIGH
Non-HDL Cholesterol (Calc): 122 mg/dL (calc) (ref <130)
Total CHOL/HDL Ratio: 3.1 (calc) (ref <5.0)
Triglycerides: 113 mg/dL (ref <150)

## 2019-05-07 MED ORDER — ROSUVASTATIN CALCIUM 5 MG PO TABS: 2.5000 mg | ORAL_TABLET | Freq: Every day | ORAL | 3 refills | Status: DC

## 2019-05-07 MED FILL — ROSUVASTATIN CALCIUM 5 MG T: 5 | 90 days supply | Qty: 45 | Fill #0

## 2019-05-08 ENCOUNTER — Other Ambulatory Visit: Payer: Self-pay

## 2019-05-08 DIAGNOSIS — I10 Essential (primary) hypertension: Secondary | ICD-10-CM

## 2019-05-08 DIAGNOSIS — E1169 Type 2 diabetes mellitus with other specified complication: Secondary | ICD-10-CM

## 2019-05-08 NOTE — Progress Notes (Signed)
lip

## 2019-05-12 ENCOUNTER — Other Ambulatory Visit: Payer: Self-pay

## 2019-05-12 ENCOUNTER — Ambulatory Visit (AMBULATORY_SURGERY_CENTER): Payer: 59 | Admitting: *Deleted

## 2019-05-12 VITALS — Temp 96.8°F | Ht 61.75 in | Wt 164.8 lb

## 2019-05-12 DIAGNOSIS — Z1211 Encounter for screening for malignant neoplasm of colon: Secondary | ICD-10-CM

## 2019-05-12 DIAGNOSIS — Z1159 Encounter for screening for other viral diseases: Secondary | ICD-10-CM

## 2019-05-12 MED ORDER — SUPREP BOWEL PREP KIT 17.5-3.13-1.6 GM/177ML PO SOLN: 1.0000 | Freq: Once | ORAL | 0 refills | Status: AC

## 2019-05-12 MED FILL — SUPREP BOWEL PREP KIT: 17.5-3.13-1 | 1 days supply | Qty: 354 | Fill #0

## 2019-05-12 NOTE — Progress Notes (Signed)
No egg or soy allergy known to patient  No issues with past sedation with any surgeries  or procedures, no intubation problems  No diet pills per patient No home 02 use per patient  No blood thinners per patient  Pt denies issues with constipation- has diarrhea issues per pt   No A fib or A flutter  EMMI video sent to pt's e mail   cov test 11-30 Monday at 830 am   Pt instructed to take her Keppra Thursday morning by 8 am 12-3!!!!!!  Due to the COVID-19 pandemic we are asking patients to follow these guidelines. Please only bring one care partner. Please be aware that your care partner may wait in the car in the parking lot or if they feel like they will be too hot to wait in the car, they may wait in the lobby on the 4th floor. All care partners are required to wear a mask the entire time (we do not have any that we can provide them), they need to practice social distancing, and we will do a Covid check for all patient's and care partners when you arrive. Also we will check their temperature and your temperature. If the care partner waits in their car they need to stay in the parking lot the entire time and we will call them on their cell phone when the patient is ready for discharge so they can bring the car to the front of the building. Also all patient's will need to wear a mask into building.

## 2019-05-14 ENCOUNTER — Encounter: Payer: Self-pay | Admitting: Gastroenterology

## 2019-05-25 ENCOUNTER — Other Ambulatory Visit: Payer: Self-pay | Admitting: Nurse Practitioner

## 2019-05-25 ENCOUNTER — Other Ambulatory Visit: Payer: Self-pay | Admitting: Gastroenterology

## 2019-05-25 ENCOUNTER — Ambulatory Visit (INDEPENDENT_AMBULATORY_CARE_PROVIDER_SITE_OTHER): Payer: 59

## 2019-05-25 DIAGNOSIS — Z1159 Encounter for screening for other viral diseases: Secondary | ICD-10-CM

## 2019-05-25 DIAGNOSIS — I10 Essential (primary) hypertension: Secondary | ICD-10-CM

## 2019-05-25 DIAGNOSIS — E1169 Type 2 diabetes mellitus with other specified complication: Secondary | ICD-10-CM

## 2019-05-25 LAB — SARS CORONAVIRUS 2 (TAT 6-24 HRS): SARS Coronavirus 2: NEGATIVE

## 2019-05-25 MED FILL — LISINOPRIL 30 MG TABS: 30 | 90 days supply | Qty: 90 | Fill #0

## 2019-05-25 MED FILL — EZETIMIBE 10 MG TABS: 10 | 90 days supply | Qty: 90 | Fill #0

## 2019-05-25 MED FILL — SUPREP BOWEL PREP KIT: 17.5-3.13-1 | 1 days supply | Qty: 354 | Fill #0

## 2019-05-28 ENCOUNTER — Ambulatory Visit (AMBULATORY_SURGERY_CENTER): Payer: 59 | Admitting: Gastroenterology

## 2019-05-28 ENCOUNTER — Other Ambulatory Visit: Payer: Self-pay

## 2019-05-28 ENCOUNTER — Encounter: Payer: Self-pay | Admitting: Gastroenterology

## 2019-05-28 VITALS — BP 169/97 | HR 67 | Temp 98.3°F | Resp 18 | Ht 61.0 in | Wt 164.0 lb

## 2019-05-28 DIAGNOSIS — D124 Benign neoplasm of descending colon: Secondary | ICD-10-CM

## 2019-05-28 DIAGNOSIS — I10 Essential (primary) hypertension: Secondary | ICD-10-CM | POA: Diagnosis not present

## 2019-05-28 DIAGNOSIS — Z1211 Encounter for screening for malignant neoplasm of colon: Secondary | ICD-10-CM | POA: Diagnosis not present

## 2019-05-28 DIAGNOSIS — E785 Hyperlipidemia, unspecified: Secondary | ICD-10-CM | POA: Diagnosis not present

## 2019-05-28 DIAGNOSIS — E119 Type 2 diabetes mellitus without complications: Secondary | ICD-10-CM | POA: Diagnosis not present

## 2019-05-28 DIAGNOSIS — D122 Benign neoplasm of ascending colon: Secondary | ICD-10-CM

## 2019-05-28 DIAGNOSIS — K635 Polyp of colon: Secondary | ICD-10-CM | POA: Diagnosis not present

## 2019-05-28 MED ORDER — SODIUM CHLORIDE 0.9 % IV SOLN: 500.0000 mL | Freq: Once | INTRAVENOUS | Status: DC

## 2019-05-28 NOTE — Progress Notes (Signed)
A/ox3, pleased with MAC, report to RN 

## 2019-05-28 NOTE — Progress Notes (Signed)
Called to room to assist during endoscopic procedure.  Patient ID and intended procedure confirmed with present staff. Received instructions for my participation in the procedure from the performing physician.  

## 2019-05-28 NOTE — Patient Instructions (Signed)
YOU HAD AN ENDOSCOPIC PROCEDURE TODAY AT [facility]:   Refer to the procedure report that was given to you for any specific questions about what was found during the examination.  If the procedure report does not answer your questions, please call your gastroenterologist to clarify.  If you requested that your care partner not be given the details of your procedure findings, then the procedure report has been included in a sealed envelope for you to review at your convenience later.  YOU SHOULD EXPECT: Some feelings of bloating in the abdomen. Passage of more gas than usual.  Walking can help get rid of the air that was put into your GI tract during the procedure and reduce the bloating. If you had a lower endoscopy (such as a colonoscopy or flexible sigmoidoscopy) you may notice spotting of blood in your stool or on the toilet paper. If you underwent a bowel prep for your procedure, you may not have a normal bowel movement for a few days.  Please Note:  You might notice some irritation and congestion in your nose or some drainage.  This is from the oxygen used during your procedure.  There is no need for concern and it should clear up in a day or so.  SYMPTOMS TO REPORT IMMEDIATELY:   Following lower endoscopy (colonoscopy or flexible sigmoidoscopy):  Excessive amounts of blood in the stool  Significant tenderness or worsening of abdominal pains  Swelling of the abdomen that is new, acute  Fever of 100F or higher  For urgent or emergent issues, a gastroenterologist can be reached at any hour by calling [phone].  DIET:  We do recommend a small meal at first, but then you may proceed to your regular diet.  Drink plenty of fluids but you should avoid alcoholic beverages for 24 hours.  ACTIVITY:  You should plan to take it easy for the rest of today and you should NOT DRIVE or use heavy machinery until tomorrow (because of the sedation medicines used during the test).     FOLLOW UP: Our staff will call the number listed on your records 48-72 hours following your procedure to check on you and address any questions or concerns that you may have regarding the information given to you following your procedure. If we do not reach you, we will leave a message.  We will attempt to reach you two times.  During this call, we will ask if you have developed any symptoms of COVID 19. If you develop any symptoms (ie: fever, flu-like symptoms, shortness of breath, cough etc.) before then, please call [phone].  If you test positive for Covid 19 in the 2 weeks post procedure, please call and report this information to [place].    If any biopsies were taken you will be contacted by phone or by letter within the next 1-3 weeks.  Please call us at [phone] if you have not heard about the biopsies in 3 weeks.   SIGNATURES/CONFIDENTIALITY: You and/or your care partner have signed paperwork which will be entered into your electronic medical record.  These signatures attest to the fact that that the information above on your After Visit Summary has been reviewed and is understood.  Full responsibility of the confidentiality of this discharge information lies with you and/or your care-partner.  Await pathology  Please read over handout about polyps  Follow high fiber diet- see handout!!  Continue your normal medications

## 2019-05-28 NOTE — Op Note (Signed)
[facility] Patient Name: Sarah Brock Procedure Date: 05/28/2019 11:02 AM MRN: [id number] Endoscopist: Thornton Park MD, MD Age: 63 Referring MD:  Date of Birth: Sep 18, 1955 Gender: Female Account #: [id number] Procedure:                Colonoscopy Indications:              Screening for colorectal malignant neoplasm, This                            is the patient's first colonoscopy                           No known family history of colon cancer or polyps. Medicines:                Monitored Anesthesia Care Procedure:                Pre-Anesthesia Assessment:                           - Prior to the procedure, a History and Physical                            was performed, and patient medications and                            allergies were reviewed. The patient's tolerance of                            previous anesthesia was also reviewed. The risks                            and benefits of the procedure and the sedation                            options and risks were discussed with the patient.                            All questions were answered, and informed consent                            was obtained. Prior Anticoagulants: The patient has                            taken no previous anticoagulant or antiplatelet                            agents. ASA Grade Assessment: II - A patient with                            mild systemic disease. After reviewing the risks                            and benefits, the patient was deemed in  satisfactory condition to undergo the procedure.                           After obtaining informed consent, the colonoscope                            was passed under direct vision. Throughout the                            procedure, the patient's blood pressure, pulse, and                            oxygen saturations were monitored continuously. The                            Colonoscope was  introduced through the anus and                            advanced to the the terminal ileum, with                            identification of the appendiceal orifice and IC                            valve. A second forward view of the right colon was                            performed. The colonoscopy was performed without                            difficulty. The patient tolerated the procedure                            well. The quality of the bowel preparation was                            excellent. The terminal ileum, ileocecal valve,                            appendiceal orifice, and rectum were photographed. Scope In: 11:14:08 AM Scope Out: 11:27:04 AM Scope Withdrawal Time: 0 hours 10 minutes 23 seconds  Total Procedure Duration: 0 hours 12 minutes 56 seconds  Findings:                 The perianal and digital rectal examinations were                            normal.                           Two sessile polyps were found in the descending                            colon and ascending colon. The polyps were <1 mm in  size. These polyps were removed with a cold biopsy                            forceps. Resection and retrieval were complete.                            Estimated blood loss was minimal.                           Multiple small and large-mouthed diverticula were                            found in the sigmoid colon and descending colon.                           Non-bleeding internal hemorrhoids were found. Complications:            No immediate complications. Estimated blood loss:                            Minimal. Estimated Blood Loss:     Estimated blood loss was minimal. Impression:               - Two <1 mm polyps in the descending colon and in                            the ascending colon, removed with a cold biopsy                            forceps. Resected and retrieved.                           - The examination  was otherwise normal on direct                            and retroflexion views. Recommendation:           - Patient has a contact number available for                            emergencies. The signs and symptoms of potential                            delayed complications were discussed with the                            patient. Return to normal activities tomorrow.                            Written discharge instructions were provided to the                            patient.                           - Resume previous diet today. High  fiber diet                            encouraged.                           - Continue present medications.                           - Await pathology results.                           - Repeat colonoscopy date to be determined after                            pending pathology results are reviewed for                            surveillance based on pathology results. Thornton Park MD, MD 05/28/2019 11:33:47 AM This report has been signed electronically.

## 2019-06-01 ENCOUNTER — Telehealth: Payer: Self-pay

## 2019-06-01 ENCOUNTER — Encounter: Payer: Self-pay | Admitting: Gastroenterology

## 2019-06-01 NOTE — Telephone Encounter (Signed)
No answer, left message to call back later today, B.Colon Rueth RN. 

## 2019-06-01 NOTE — Telephone Encounter (Signed)
2nd follow up call made.  NALM 

## 2019-06-16 ENCOUNTER — Other Ambulatory Visit: Payer: Self-pay | Admitting: Nurse Practitioner

## 2019-06-16 MED FILL — metFORMIN HCL 1000 MG TABS: 1000 | 90 days supply | Qty: 180 | Fill #0

## 2019-07-08 MED FILL — levETIRAcetam 500 MG TABS: 500 | 90 days supply | Qty: 180 | Fill #1

## 2019-08-10 ENCOUNTER — Other Ambulatory Visit: Payer: Self-pay

## 2019-08-21 MED FILL — EZETIMIBE 10 MG TABS: 10 | 90 days supply | Qty: 90 | Fill #1

## 2019-08-21 MED FILL — LISINOPRIL 30 MG TABS: 30 | 90 days supply | Qty: 90 | Fill #1

## 2019-08-21 MED FILL — ROSUVASTATIN CALCIUM 5 MG T: 5 | 90 days supply | Qty: 45 | Fill #1

## 2019-08-25 ENCOUNTER — Encounter: Payer: Self-pay | Admitting: Family

## 2019-08-25 ENCOUNTER — Ambulatory Visit (INDEPENDENT_AMBULATORY_CARE_PROVIDER_SITE_OTHER): Payer: 59 | Admitting: Family

## 2019-08-25 ENCOUNTER — Other Ambulatory Visit: Payer: Self-pay

## 2019-08-25 VITALS — BP 160/98 | HR 90 | Temp 97.7°F | Resp 18 | Ht 61.0 in | Wt 170.0 lb

## 2019-08-25 DIAGNOSIS — K439 Ventral hernia without obstruction or gangrene: Secondary | ICD-10-CM

## 2019-08-25 DIAGNOSIS — I10 Essential (primary) hypertension: Secondary | ICD-10-CM

## 2019-08-25 DIAGNOSIS — M5432 Sciatica, left side: Secondary | ICD-10-CM

## 2019-08-25 MED ORDER — IBUPROFEN 600 MG PO TABS: 600.0000 mg | ORAL_TABLET | Freq: Three times a day (TID) | ORAL | 0 refills | Status: AC

## 2019-08-25 MED ORDER — CLONIDINE HCL 0.1 MG PO TABS: 0.1000 mg | ORAL_TABLET | Freq: Once | ORAL | Status: DC

## 2019-08-25 MED FILL — IBUPROFEN 600 MG TABLET: 600 | 7 days supply | Qty: 21 | Fill #0

## 2019-08-25 NOTE — Patient Instructions (Addendum)
- Take Ibuprofen 600 mg tablet one by mouth every 8 hours x 7 days.May stop if pain resolve. - Apply heating/warm compressor to left lower back for pain  - Please go to ED as soon possible if unable to push /reduce abdominal hernia or any signs of incarceration. - check B/p and record and Notify provider if > 140/90   Sciatica Rehab Ask your health care provider which exercises are safe for you. Do exercises exactly as told by your health care provider and adjust them as directed. It is normal to feel mild stretching, pulling, tightness, or discomfort as you do these exercises. Stop right away if you feel sudden pain or your pain gets worse. Do not begin these exercises until told by your health care provider. Stretching and range-of-motion exercises These exercises warm up your muscles and joints and improve the movement and flexibility of your hips and back. These exercises also help to relieve pain, numbness, and tingling. Sciatic nerve glide 1. Sit in a chair with your head facing down toward your chest. Place your hands behind your back. Let your shoulders slump forward. 2. Slowly straighten one of your legs while you tilt your head back as if you are looking toward the ceiling. Only straighten your leg as far as you can without making your symptoms worse. 3. Hold this position for __________ seconds. 4. Slowly return to the starting position. 5. Repeat with your other leg. Repeat __________ times. Complete this exercise __________ times a day. Knee to chest with hip adduction and internal rotation  1. Lie on your back on a firm surface with both legs straight. 2. Bend one of your knees and move it up toward your chest until you feel a gentle stretch in your lower back and buttock. Then, move your knee toward the shoulder that is on the opposite side from your leg. This is hip adduction and internal rotation. ? Hold your leg in this position by holding on to the front of your knee. 3. Hold  this position for __________ seconds. 4. Slowly return to the starting position. 5. Repeat with your other leg. Repeat __________ times. Complete this exercise __________ times a day. Prone extension on elbows  1. Lie on your abdomen on a firm surface. A bed may be too soft for this exercise. 2. Prop yourself up on your elbows. 3. Use your arms to help lift your chest up until you feel a gentle stretch in your abdomen and your lower back. ? This will place some of your body weight on your elbows. If this is uncomfortable, try stacking pillows under your chest. ? Your hips should stay down, against the surface that you are lying on. Keep your hip and back muscles relaxed. 4. Hold this position for __________ seconds. 5. Slowly relax your upper body and return to the starting position. Repeat __________ times. Complete this exercise __________ times a day. Strengthening exercises These exercises build strength and endurance in your back. Endurance is the ability to use your muscles for a long time, even after they get tired. Pelvic tilt This exercise strengthens the muscles that lie deep in the abdomen. 1. Lie on your back on a firm surface. Bend your knees and keep your feet flat on the floor. 2. Tense your abdominal muscles. Tip your pelvis up toward the ceiling and flatten your lower back into the floor. ? To help with this exercise, you may place a small towel under your lower back and try to  push your back into the towel. 3. Hold this position for __________ seconds. 4. Let your muscles relax completely before you repeat this exercise. Repeat __________ times. Complete this exercise __________ times a day. Alternating arm and leg raises  1. Get on your hands and knees on a firm surface. If you are on a hard floor, you may want to use padding, such as an exercise mat, to cushion your knees. 2. Line up your arms and legs. Your hands should be directly below your shoulders, and your knees  should be directly below your hips. 3. Lift your left leg behind you. At the same time, raise your right arm and straighten it in front of you. ? Do not lift your leg higher than your hip. ? Do not lift your arm higher than your shoulder. ? Keep your abdominal and back muscles tight. ? Keep your hips facing the ground. ? Do not arch your back. ? Keep your balance carefully, and do not hold your breath. 4. Hold this position for __________ seconds. 5. Slowly return to the starting position. 6. Repeat with your right leg and your left arm. Repeat __________ times. Complete this exercise __________ times a day. Posture and body mechanics Good posture and healthy body mechanics can help to relieve stress in your body's tissues and joints. Body mechanics refers to the movements and positions of your body while you do your daily activities. Posture is part of body mechanics. Good posture means:  Your spine is in its natural S-curve position (neutral).  Your shoulders are pulled back slightly.  Your head is not tipped forward. Follow these guidelines to improve your posture and body mechanics in your everyday activities. Standing   When standing, keep your spine neutral and your feet about hip width apart. Keep a slight bend in your knees. Your ears, shoulders, and hips should line up.  When you do a task in which you stand in one place for a long time, place one foot up on a stable object that is 2-4 inches (5-10 cm) high, such as a footstool. This helps keep your spine neutral. Sitting   When sitting, keep your spine neutral and keep your feet flat on the floor. Use a footrest, if necessary, and keep your thighs parallel to the floor. Avoid rounding your shoulders, and avoid tilting your head forward.  When working at a desk or a computer, keep your desk at a height where your hands are slightly lower than your elbows. Slide your chair under your desk so you are close enough to maintain  good posture.  When working at a computer, place your monitor at a height where you are looking straight ahead and you do not have to tilt your head forward or downward to look at the screen. Resting  When lying down and resting, avoid positions that are most painful for you.  If you have pain with activities such as sitting, bending, stooping, or squatting, lie in a position in which your body does not bend very much. For example, avoid curling up on your side with your arms and knees near your chest (fetal position).  If you have pain with activities such as standing for a long time or reaching with your arms, lie with your spine in a neutral position and bend your knees slightly. Try the following positions: ? Lying on your side with a pillow between your knees. ? Lying on your back with a pillow under your knees. Lifting  When lifting objects, keep your feet at least shoulder width apart and tighten your abdominal muscles.  Bend your knees and hips and keep your spine neutral. It is important to lift using the strength of your legs, not your back. Do not lock your knees straight out.  Always ask for help to lift heavy or awkward objects. This information is not intended to replace advice given to you by your health care provider. Make sure you discuss any questions you have with your health care provider. Document Revised: 10/03/2018 Document Reviewed: 07/03/2018 Elsevier Patient Education  [employer].  - Use proper lifting technique to prevent straining  - Referral placed for General Surgery for evaluation of abdominal hernia.Specialist office will call you for appointment.  Hernia, Adult     A hernia happens when tissue inside your body pushes out through a weak spot in your belly muscles (abdominal wall). This makes a round lump (bulge). The lump may be:  In a scar from surgery that was done in your belly (incisional hernia).  Near your belly button (umbilical  hernia).  In your groin (inguinal hernia). Your groin is the area where your leg meets your lower belly (abdomen). This kind of hernia could also be: ? In your scrotum, if you are female. ? In folds of skin around your vagina, if you are female.  In your upper thigh (femoral hernia).  Inside your belly (hiatal hernia). This happens when your stomach slides above the muscle between your belly and your chest (diaphragm). If your hernia is small and it does not cause pain, you may not need treatment. If your hernia is large or it causes pain, you may need surgery. Follow these instructions at home: Activity  Avoid stretching or overusing (straining) the muscles near your hernia. Straining can happen when you: ? Lift something heavy. ? Poop (have a bowel movement).  Do not lift anything that is heavier than 10 lb (4.5 kg), or the limit that you are told, until your doctor says that it is safe.  Use the strength of your legs when you lift something heavy. Do not use only your back muscles to lift. General instructions  Do these things if told by your doctor so you do not have trouble pooping (constipation): ? Drink enough fluid to keep your pee (urine) pale yellow. ? Eat foods that are high in fiber. These include fresh fruits and vegetables, whole grains, and beans. ? Limit foods that are high in fat and processed sugars. These include foods that are fried or sweet. ? Take medicine for trouble pooping.  When you cough, try to cough gently.  You may try to push your hernia in by very gently pressing on it when you are lying down. Do not try to force the bulge back in if it will not push in easily.  If you are overweight, work with your doctor to lose weight safely.  Do not use any products that have nicotine or tobacco in them. These include cigarettes and e-cigarettes. If you need help quitting, ask your doctor.  If you will be having surgery (hernia repair), watch your hernia for  changes in shape, size, or color. Tell your doctor if you see any changes.  Take over-the-counter and prescription medicines only as told by your doctor.  Keep all follow-up visits as told by your doctor. Contact a doctor if:  You get new pain, swelling, or redness near your hernia.  You poop fewer times in a  week than normal.  You have trouble pooping.  You have poop (stool) that is more dry than normal.  You have poop that is harder or larger than normal. Get help right away if:  You have a fever.  You have belly pain that gets worse.  You feel sick to your stomach (nauseous).  You throw up (vomit).  Your hernia cannot be pushed in by very gently pressing on it when you are lying down. Do not try to force the bulge back in if it will not push in easily.  Your hernia: ? Changes in shape or size. ? Changes color. ? Feels hard or it hurts when you touch it. These symptoms may represent a serious problem that is an emergency. Do not wait to see if the symptoms will go away. Get medical help right away. Call your local emergency services (911 in the [place] Summary  A hernia happens when tissue inside your body pushes out through a weak spot in the belly muscles. This creates a bulge.  If your hernia is small and it does not hurt, you may not need treatment. If your hernia is large or it hurts, you may need surgery.  If you will be having surgery, watch your hernia for changes in shape, size, or color. Tell your doctor about any changes. This information is not intended to replace advice given to you by your health care provider. Make sure you discuss any questions you have with your health care provider. Document Revised: 10/02/2018 Document Reviewed: 03/13/2017 Elsevier Patient Education  [employer].

## 2019-08-25 NOTE — Progress Notes (Signed)
Provider: Kitana Gage FNP-C  Lauree Chandler, NP  Patient Care Team: Lauree Chandler, NP as PCP - General (Geriatric Medicine)  Extended Emergency Contact Information Primary Emergency Contact: Thomas,Michael Address: [address]          [place], [place] [address] Johnnette Litter of [place] Phone: [phone] Mobile Phone: [phone] Relation: Son Secondary Emergency Contact: [facility] Address: [address]          [place], [place] [address] Johnnette Litter of [place] Phone: [phone] Mobile Phone: [phone] Relation: Relative  Code Status: Full Code  Goals of care: Advanced Directive information Advanced Directives 05/06/2019  Does Patient Have a Medical Advance Directive? Yes  Type of Advance Directive Living will  Does patient want to make changes to medical advance directive? No - Patient declined  Copy of [place] in Chart? -  Would patient like information on creating a medical advance directive? -     Chief Complaint  Patient presents with  . Acute Visit    lower back and lower abd pain    HPI:  Pt is a 64 y.o. female seen today for an acute visit for evaluation of left lower back pain and abdominal hernia.Had left sharp left hip pain this morning " states made her scream".she took some tylenol which eased off the pain but did completely go away.she went to work got busy but required another tylenol around [place] has gotten worst as the day progressed.Pain radiates down to thigh area she denies any numbness,tingling or weakness of leg.Has had some muscle spasm sometimes at night which resolved when she gets up and walk around.  Abdominal Hernia -  Chronic since she had appendectomy though states has worsen.Hernia worst when seated.denies any constipation,nausea or vomiting.Has chronic liquid diarrhea,she does not eat breakfast and lunch but eats dinner.Eats apple sauce,bananas and fruits during the day.Latest  colonoscopy done 05/2019 multiple polyps removed and diverticula noted.Advised to follow up in 3 years for repeat colonoscopy.   Past Medical History:  Diagnosis Date  . Allergy   . Anemia, unspecified   . Asthma    as a child, no problem as adult, no inhaler  . Bradycardia    past hx   . Diabetes mellitus    Type II  . History of blood transfusion    pre hysterectomy  . Osteoarthrosis, unspecified whether generalized or localized, unspecified site   . Other and unspecified hyperlipidemia   . Other convulsions    last one 2013  . Other malaise and fatigue   . Other specified cardiac dysrhythmias(427.89) 2006   "history of Bradycardia" .  wore a mointor nothing found  . Seizure disorder ([place])    last seizure 2013 per pt 05-12-2019 PV   . Unspecified essential hypertension   . Unspecified vitamin D deficiency    Past Surgical History:  Procedure Laterality Date  . ABDOMINAL HYSTERECTOMY    . APPENDECTOMY    . BREAST LUMPECTOMY Right 1997  . BREAST LUMPECTOMY Bilateral    neg for cancer  . CHOLECYSTECTOMY N/A 02/28/2016   Procedure: LAPAROSCOPIC CHOLECYSTECTOMY;  Surgeon: Mickeal Skinner, MD;  Location: [facility];  Service: General;  Laterality: N/A;  . IR CATHETER TUBE CHANGE  01/02/2018  . IR RADIOLOGIST EVAL & MGMT  12/25/2017  . IR RADIOLOGIST EVAL & MGMT  01/08/2018  . LAPAROSCOPIC APPENDECTOMY N/A 01/15/2018   Procedure: LAPAROSCOPIC APPENDECTOMY CONVERTED TO OPEN APPENDECTOMY SMALL BOWEL RESECTION;  Surgeon: Donnie Mesa, MD;  Location: [facility] OR;  Service: General;  Laterality: N/A;    Allergies  Allergen Reactions  . Simvastatin Other (See Comments)    Stomach pain, low back pain   . Shrimp [Shellfish Allergy] Other (See Comments)    Swelling and hypersensitivity    Outpatient Encounter Medications as of 08/25/2019  Medication Sig  . BIOTIN PO Take 1 tablet by mouth daily.  . cetirizine (ZYRTEC) 10 MG tablet Take 10 mg by mouth at bedtime.  Marland Kitchen ezetimibe (ZETIA) 10 MG  tablet Take one tablet by mouth once daily.  . ferrous sulfate 325 (65 FE) MG tablet Take 1 tablet (325 mg total) by mouth 2 (two) times daily with a meal.  . glucose blood test strip Use as instructed  . levETIRAcetam (KEPPRA) 500 MG tablet TAKE 1 TABLET BY MOUTH EVERY 12 HOURS FOR SEIZURES  . lisinopril (ZESTRIL) 30 MG tablet Take one tablet by mouth once daily for blood pressure.  . metFORMIN (GLUCOPHAGE) 1000 MG tablet TAKE 1 TABLET BY MOUTH 2 TIMES DAILY WITH MEALS TO CONTROL BLOOD SUGAR  . rosuvastatin (CRESTOR) 5 MG tablet Take 0.5 tablets (2.5 mg total) by mouth at bedtime.  Marland Kitchen ibuprofen (ADVIL) 600 MG tablet Take 1 tablet (600 mg total) by mouth 3 (three) times daily for 7 days.   Facility-Administered Encounter Medications as of 08/25/2019  Medication  . cloNIDine (CATAPRES) tablet 0.1 mg    Review of Systems  Constitutional: Negative for chills, fatigue and fever.  Respiratory: Negative for cough, chest tightness, shortness of breath and wheezing.   Cardiovascular: Negative for chest pain, palpitations and leg swelling.  Gastrointestinal: Negative for abdominal distention, abdominal pain, blood in stool, constipation, nausea and vomiting.       Chronic loose stool status post colonoscopy December,2020   Genitourinary: Negative for difficulty urinating, flank pain, frequency and urgency.       Wears pad for incontinency.increase urine frequency   Musculoskeletal: Positive for arthralgias. Negative for gait problem.       Left hip/back pain radiating down to thigh.   Skin: Negative for color change, pallor and rash.  Neurological: Negative for dizziness, speech difficulty, light-headedness, numbness and headaches.  Psychiatric/Behavioral: Negative for agitation and sleep disturbance. The patient is not nervous/anxious.     Immunization History  Administered Date(s) Administered  . Influenza Split 03/31/2013  . Influenza, High Dose Seasonal PF 04/16/2016  . Influenza,inj,Quad  PF,6+ Mos 03/20/2019  . Influenza-Unspecified 03/26/2015, 03/25/2018  . Pneumococcal Conjugate-13 07/09/2012, 05/19/2014  . Pneumococcal Polysaccharide-23 09/26/2016  . Tdap 06/25/2009   Pertinent  Health Maintenance Due  Topic Date Due  . OPHTHALMOLOGY EXAM  09/09/1965  . MAMMOGRAM  05/19/2011  . HEMOGLOBIN A1C  11/03/2019  . FOOT EXAM  05/05/2020  . COLONOSCOPY  05/27/2024  . INFLUENZA VACCINE  Completed  . PAP SMEAR-Modifier  Discontinued   Fall Risk  12/31/2018 10/08/2018 06/20/2018 12/25/2017 09/24/2017  Falls in the past year? 0 0 0 No No  Number falls in past yr: - 0 0 - -  Injury with Fall? - 0 0 - -    Vitals:   08/25/19 1517 08/25/19 1627  BP: (!) 180/100 (!) 160/98  Pulse: 90   Resp: 18   Temp: 97.7 F (36.5 C)   TempSrc: Oral   SpO2: 97%   Weight: 170 lb (77.1 kg)   Height: 5\' 1"  (1.549 m)    Body mass index is 32.12 kg/m. Physical Exam Vitals reviewed.  Constitutional:  General: She is not in acute distress.    Appearance: She is obese. She is not ill-appearing.  HENT:     Head: Normocephalic.  Eyes:     General: No scleral icterus.       Right eye: No discharge.        Left eye: No discharge.     Extraocular Movements: Extraocular movements intact.     Conjunctiva/sclera: Conjunctivae normal.     Pupils: Pupils are equal, round, and reactive to light.  Neck:     Vascular: No carotid bruit.  Cardiovascular:     Rate and Rhythm: Normal rate and regular rhythm.     Pulses: Normal pulses.     Heart sounds: Normal heart sounds. No murmur. No friction rub. No gallop.   Pulmonary:     Effort: Pulmonary effort is normal. No respiratory distress.     Breath sounds: Normal breath sounds. No wheezing, rhonchi or rales.  Chest:     Chest wall: No tenderness.  Abdominal:     General: Bowel sounds are normal. There is no distension.     Palpations: Abdomen is soft. There is no mass.     Tenderness: There is no abdominal tenderness. There is no right CVA  tenderness, left CVA tenderness, guarding or rebound.     Hernia: A hernia is present. Hernia is present in the ventral area. There is no hernia in the left inguinal area, right femoral area, left femoral area or right inguinal area.     Comments: Hernia more prominent in sitting and larger in standing position.Soft and reducible.No erythema noted.   Musculoskeletal:        General: No swelling. Normal range of motion.     Cervical back: Normal range of motion. No rigidity or tenderness.     Lumbar back: No swelling. Normal range of motion. Negative right straight leg raise test and negative left straight leg raise test.     Right lower leg: No edema.     Left lower leg: No edema.       Legs:  Lymphadenopathy:     Cervical: No cervical adenopathy.  Skin:    General: Skin is warm and dry.     Coloration: Skin is not pale.     Findings: No bruising or erythema.  Neurological:     Mental Status: She is alert and oriented to person, place, and time.     Cranial Nerves: No cranial nerve deficit.     Sensory: No sensory deficit.     Motor: No weakness.     Coordination: Coordination normal.     Gait: Gait normal.  Psychiatric:        Mood and Affect: Mood normal.        Behavior: Behavior normal.        Thought Content: Thought content normal.        Judgment: Judgment normal.    Labs reviewed: Recent Labs    10/15/18 0806 05/06/19 1558  NA 140 139  K 4.2 4.5  CL 107 108  CO2 25 23  GLUCOSE 97 100  BUN 12 13  CREATININE 0.93 0.92  CALCIUM 9.7 10.0   Recent Labs    10/15/18 0806 05/06/19 1558  AST 19 18  ALT 16 14  BILITOT 0.3 0.5  PROT 7.1 7.2   Recent Labs    10/15/18 0806 01/01/19 0802 05/06/19 1558  WBC 6.4 5.8 6.5  NEUTROABS 2,950 2,610 2,828  HGB 12.2 11.6* 12.0  HCT 37.1 36.5 37.5  MCV 78.9* 81.8 80.5  PLT 296 296 276   Lab Results  Component Value Date   TSH 1.29 12/16/2017   Lab Results  Component Value Date   HGBA1C 6.4 (H) 05/06/2019   Lab  Results  Component Value Date   CHOL 181 05/06/2019   HDL 59 05/06/2019   LDLCALC 101 (H) 05/06/2019   TRIG 113 05/06/2019   CHOLHDL 3.1 05/06/2019    Significant Diagnostic Results in last 30 days:  No results found.  Assessment/Plan 1. Ventral hernia without obstruction or gangrene Reducible.More Prominent with sitting and larger in standing position.No signs of infection or incarceration noted.Discussed referral to General Surgery due to worsening of hernia.Verbalized understanding and agrees to referral. - Advised to go to ED as soon possible if unable to push /reduce abdominal hernia or any signs of incarceration. - Additional Education information on hernia given on AVS - Ambulatory referral to General Surgery  2. Sciatic pain, left Left lower back tender to palpation.Negative straight leg Raise.reports pain from lower back/hip radiating to left anterior thigh area.Tylenol eased off pain but not resolved.Discuss use of NSAID to relief pain.Advised to take with food. - ibuprofen (ADVIL) 600 MG tablet; Take 1 tablet (600 mg total) by mouth 3 (three) times daily for 7 days.  Dispense: 21 tablet; Refill: 0 - Additional education and exercise information provided on AVS.   3. Essential hypertension B/p elevated on arrival.Asymptomatic.Negative exam finding.suspect possible due to her left sciatic nerve pain.Clonidine x 1 dose given B/p trending down. - Advised to check B/p and record and Notify provider if > 140/90 x 3 consecutive readings. - cloNIDine (CATAPRES) tablet 0.1 mg  Family/ staff Communication: Reviewed plan of care with patient verbalized understanding.   Labs/tests ordered: None   Next Appointment: has appointment 11/04/2019 with PCP   Sandrea Hughs, NP

## 2019-09-16 MED FILL — metFORMIN HCL 1000 MG TABS: 1000 | 90 days supply | Qty: 180 | Fill #1

## 2019-09-29 ENCOUNTER — Ambulatory Visit: Payer: Self-pay | Admitting: Surgery

## 2019-09-29 DIAGNOSIS — K439 Ventral hernia without obstruction or gangrene: Secondary | ICD-10-CM | POA: Diagnosis not present

## 2019-09-29 NOTE — H&P (Signed)
History of Present Illness Sarah Brock. Sarah Brum MD; 09/29/2019 2:13 PM) The patient is a 64 year old female who presents with an abdominal wall hernia. This patient is status post emergent exporter laparotomy, open appendectomy, small bowel resection in July 2019. She had a pelvic drain placed and her wound was left open. Eventually the drain was removed and her wound healed. Nevertheless couple of months the patient has noticed some swelling above her midline incision. Occasionally this causes some discomfort. She denies any significant change in her bowel habits. She actually does have occasional postprandial diarrhea. She denies any obstructive symptoms. She has not had any imaging of this area. She presents now for possible hernia repair.   Problem List/Past Medical Sarah Key K. Chistian Kasler, MD; 09/29/2019 2:13 PM) N&V (NAUSEA AND VOMITING) (R11.2) PERFORATED APPENDICITIS (K35.32) VENTRAL HERNIA WITHOUT OBSTRUCTION OR GANGRENE (K43.9) GALL BLADDER STONES (K80.20) [02/23/2016]:  Past Surgical History (Blyss Lugar K. Jaskirat Zertuche, MD; 09/29/2019 2:13 PM) Hysterectomy (not due to cancer) - Partial Oral Surgery  Diagnostic Studies History Sarah Brock. Sarah Thackston, MD; 09/29/2019 2:13 PM) Colonoscopy never Pap Smear >5 years ago  Allergies Sarah Brock, CMA; 09/29/2019 1:56 PM) Simvastatin *ANTIHYPERLIPIDEMICS* Abdominal pain. SHELLFISH Allergies Reconciled  Medication History Sarah Brock, CMA; 09/29/2019 1:57 PM) Tylenol (500MG  Capsule, Oral as needed) Active. Zetia (10MG  Tablet, Oral) Active. ZyrTEC Allergy (10MG  Tablet, Oral) Active. LevETIRAcetam (500MG  Tablet, Oral) Active. Lisinopril (30MG  Tablet, Oral) Active. MetFORMIN HCl (500MG  Tablet, Oral) Active. Ferrous Sulfate (325 (65 Fe)MG Tablet, Oral) Active. Medications Reconciled  Social History Sarah Brock. Sarah Leavell, MD; 09/29/2019 2:13 PM) Alcohol use Occasional alcohol use. Caffeine use Coffee, Tea. No drug use Tobacco use Never  smoker.  Family History Sarah Brock. Sarah Lewing, MD; 09/29/2019 2:13 PM) Diabetes Mellitus Son.  Pregnancy / Birth History Sarah Brock. Sarah Bann, MD; 09/29/2019 2:13 PM) Age at menarche 74 years. Gravida 4 Maternal age 52-20 Para 3  Other Problems Sarah Brock. Sarah Monds, MD; 09/29/2019 2:13 PM) Back Pain Cholelithiasis Diabetes Mellitus High blood pressure Seizure Disorder Transfusion history     Review of Systems Sarah Key K. Indy Kuck MD; 09/29/2019 2:13 PM)  Note: Positive for abdominal pain, abdominal swelling, diarrhea  10 system ROS otherwise negative   Vitals Sarah Brock CMA; 09/29/2019 1:58 PM) 09/29/2019 1:57 PM Weight: 172.2 lb Height: 52in Body Surface Area: 1.58 m Body Mass Index: 44.77 kg/m  Temp.: 97.73F(Tympanic)  Pulse: 110 (Regular)  BP: 112/72 (Sitting, Left Arm, Standard)        Physical Exam Sarah Key K. Christina Waldrop MD; 09/29/2019 2:14 PM)  The physical exam findings are as follows: Note:Constitutional: WDWN in NAD, conversant, no obvious deformities Eyes: Pupils equal, round; sclera anicteric; moist conjunctiva; no lid lag HENT: Oral mucosa moist; good dentition Neck: No masses palpated, trachea midline; no thyromegaly Lungs: CTA bilaterally; normal respiratory effort CV: Regular rate and rhythm; no murmurs; extremities well-perfused with no edema Abd: +bowel sounds, soft, non-tender, no palpable organomegaly; healed midline incision; obvious ventral hernia above the midline incision; begins at upper edge of umbilicus and extends up into the epigastrium. Palpable fascial defect about 3 cm. Musc: Normal gait; no apparent clubbing or cyanosis in extremities Lymphatic: No palpable cervical or axillary lymphadenopathy Skin: Warm, dry; no sign of jaundice Psychiatric - alert and oriented x 4; calm mood and affect    Assessment & Plan Sarah Key K. Shine Mikes MD; 09/29/2019 2:08 PM)  VENTRAL HERNIA WITHOUT OBSTRUCTION OR GANGRENE (K43.9)  Current  Plans Schedule for Surgery - Open repair of supraumbilical ventral hernia. The surgical procedure has been discussed  with the patient. Potential risks, benefits, alternative treatments, and expected outcomes have been explained. All of the patient's questions at this time have been answered. The likelihood of reaching the patient's treatment goal is good. The patient understand the proposed surgical procedure and wishes to proceed.  Sarah Brock. Georgette Dover, MD, [facility]  General/ Trauma Surgery   09/29/2019 2:15 PM

## 2019-10-05 ENCOUNTER — Other Ambulatory Visit: Payer: Self-pay | Admitting: Nurse Practitioner

## 2019-10-05 MED FILL — levETIRAcetam 500 MG TABS: 500 | 90 days supply | Qty: 180 | Fill #0

## 2019-10-13 ENCOUNTER — Encounter: Payer: Self-pay | Admitting: Nurse Practitioner

## 2019-10-13 ENCOUNTER — Other Ambulatory Visit: Payer: Self-pay | Admitting: *Deleted

## 2019-10-13 MED ORDER — FREESTYLE LITE DEVI: 0 refills | Status: DC

## 2019-10-13 MED ORDER — FREESTYLE LITE TEST VI STRP: ORAL_STRIP | 1 refills | Status: DC

## 2019-10-13 MED FILL — FREESTYLE LITE TEST STRIP: 75 days supply | Qty: 150 | Fill #0

## 2019-10-13 MED FILL — FREESTYLE LITE METER: 30 days supply | Qty: 1 | Fill #0

## 2019-10-13 NOTE — Progress Notes (Signed)
This encounter was created in error - please disregard.

## 2019-10-13 NOTE — Telephone Encounter (Signed)
Routed to Jessica Eubanks NP  

## 2019-10-13 NOTE — Telephone Encounter (Signed)
Received request from pharmacy:

## 2019-10-14 ENCOUNTER — Other Ambulatory Visit: Payer: Self-pay | Admitting: *Deleted

## 2019-10-14 MED ORDER — FREESTYLE LANCETS MISC: 1 refills | Status: DC

## 2019-10-14 MED FILL — FREESTYLE LANCETS: 50 days supply | Qty: 100 | Fill #0

## 2019-10-14 NOTE — Telephone Encounter (Signed)
[place].

## 2019-10-20 ENCOUNTER — Ambulatory Visit: Admit: 2019-10-21 | Discharge: 2019-10-21 | Payer: BLUE CROSS/BLUE SHIELD | Attending: Physician | Primary: Physician

## 2019-10-20 DIAGNOSIS — I480 Paroxysmal atrial fibrillation: Secondary | ICD-10-CM

## 2019-10-20 DIAGNOSIS — Z950 Presence of cardiac pacemaker: Secondary | ICD-10-CM

## 2019-10-20 DIAGNOSIS — I472 Ventricular tachycardia: Secondary | ICD-10-CM

## 2019-10-20 MED ORDER — APIXABAN 5 MG TABLET
5 | ORAL_TABLET | Freq: Two times a day (BID) | ORAL | 3 refills | Status: DC
Start: 2019-10-20 — End: 2020-07-26

## 2019-10-20 NOTE — Patient Instructions (Signed)
It was a pleasure to see you again today.    The scheduling team will contact you about finding a date for the catheter ablation procedure.  Please contact me if you have not heard back by the end of the week.    Start taking the apixaban (Eliquis) 5 mg twice daily 1 month before the ablation procedure. You can stop the aspirin.      ====================================================      MyChart Messages  For your safety and best care, please DO NOT use MyChart messages to report symptoms. (Symptoms should be reported by calling [phone]). Please use MyChart for non-urgent matters such as general questions, non-urgent prescription refills, or non-urgent scheduling.     Please do not use MyChart for urgent messages in the evenings or on weekends, as messages are only checked during regular business hours.   -Please note that MyChart messages are routed to a central pool and one of your provider's team members will get back to you. Expect up to 3 business days for response.    Lab and test scheduling and results:  During your visit, your provider may order radiology exams or other tests. If you have not heard back within 1-2 weeks to be scheduled, please call:   For echocardiogram or stress-echo: [phone], Option 1.   For radiology (CT scan, MRI, PET scan, perfusion stress-test): [phone]   For other tests, call our office: [phone]  Test results may take up to 7- 10 business days and may be communicated to you in the following ways:    If you have signed up for MyChart, your results will be available on your MyChart account.    If you are scheduled for a follow up appointment after your tests are completed, your provider may wish to wait and discuss them during the follow up appointment.     We will not call you with the results of a normal test or device interrogation.     Procedure/Admission scheduling  Our EP procedure scheduling team will contact you to pick a date for your procedure or  admission. Please call [phone] to speak with the schedulers if you do not hear back in 5 business days. This is the direct scheduling line and should only be used for procedure scheduling.    Paging Operator:   If you have an urgent/emergent problem after 5pm, during the weekend, or on a holiday, please call [phone] and ask the operator to page the EP fellow on-call.    Temporary leave/disability forms directly related to EP procedures:  Please fill out all your demographic information and include anticipated work absence dates.  Please allow for a 2-week turnaround time for all paperwork submitted.   EP clinic fax: [phone]    Clinical Notes on My Chart:  Progress notes documented by your healthcare team will now be available on the MyChart portal. We believe that patients should be a part of the healthcare team. Please recognize that these notes are for documentation purposes and typos/minor errors may occur. If you identify any major discrepancies in the documentation, please let us know via MyChart or bring them to your next scheduled visit. With increased transparency, our hope is that we create more trust, better communication and increased satisfaction.

## 2019-10-20 NOTE — Progress Notes (Signed)
I performed this consultation using real-time Telehealth tools, including a live video connection between my location and the patient's location. Prior to initiating the consultation, I obtained informed verbal consent to perform this consultation using Telehealth tools and answered all the questions about the Telehealth interaction.    DATE OF VISIT:  10/20/2019      CHIEF COMPLAINTS: Mobitz II AV block status post pacemaker, atrial fibrillation, NSVT     I had the pleasure of seeing [name] as a telehealth video visit in Cardiac Electrophysiology today for further evaluation of symptomatic bradycardia, status post dual-chamber pacemaker, atrial fibrillation, and NSVT.    Ms. Bhatt is a 64 y.o. woman with history of thyroid nodule, IBS, and hysterectomy who presented with exertional fatigue and mild shortness of breath, as well as occasional palpitations that awakened her in the middle of the night.  I reviewed old records and/or communicated with other professionals for a total of 30 minutes on 04/21/2019.  This is directly related to a face-to-face visit encounter I personally provided today.    Summary of records review and/or communication:    Exertional symptoms, which started on 08/23/2015 and persisted thereafter, were also sometimes associated with lightheadedness.  She had previously been very active, feeding and riding her horse nearly every day.  Evaluation was notable for a baseline right bundle branch block, and periods of symptomatic 2:1 AV block while reaching peak exercise on a treadmill stress test (see details below).  Holter monitoring also reportedly showed blocked PAC's with resultant bradycardia, though to my review blocked atrial activity was not typically premature (see details below). 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     A dual-chamber  device was implanted without complication on 02/26/7168.  Thereafter, she noticed significant improvement in her symptoms, but had one episode very similar to her pre-pacemaker symptoms on 11/18/2015 and several milder episodes afterwards aborted by resting.  Device function was normal -- at follow-up 11/2015, she paced 5% in the RA and 1% in the RV.  She underwent breast augmentation surgery on 04/18/2016.     At her visit in April 2018, she reported having a "wide complex tachycardia" for 5 minutes (up to 115 bpm) after a colonoscopy 06/2016.  She also started having episodes of palpitations in February and specifically recalled symptomatic episodes on 2/12 and 2/24 -- the latter correlated with a >1 hour episode of atrial fibrillation recorded by the device.  She was started on aspirin 81 mg daily and recommended to take metoprolol 12.5 mg twice daily, though opted not to take it.     At her visit in 03/2017, she reported shortness of breath with exertion (such as walking up stairs) and wondered if there could be a functional issue with her heart -- she actually contacted me prior to the visit to request that an echocardiogram be scheduled the same day.  The echocardiogram showed normal LV function and no hemodynamically significant valvular disease.  Device interrogation showed 62 minutes of atrial fibrillation between April and October 2018, with the longest episode lasting 11:30 minutes on 01/01/2017.  Rate histograms showed a shift to the left, with atrial sensing or pacing in the 60s more than 50% of the time and only rare atrial sensing above 90 bpm.  We agreed to try adding rate-response to her device, and to continue conservative management of the  rare atrial fibrillation.  The rate-response was later disabled because she was feeling "jittery."   Device check in 06/2017 showed 0.1% atrial high rate detection (10 episodes), with the longest 53 minutes.   Device check in 10/2017 showed 0.1% atrial high rate  detection, with the longest episode 35 minutes.  There was also a 20 beat episode of NSVT recorded on 09/17/2017, correlating with an episode of lightheadedness (see my note dated 11/15/2017).  I recommended she try taking metoprolol 12.5 mg twice daily and consider cardiac MRI and/or PET (particularly given the paroxysmal AV block and atrial fibrillation).   Cardiac MRI in 12/2017 revealed mid-wall and subepicardial late gadolinium enhancement in the lateral wall at the level of the base.  A follow-up PET CT scan showed no abnormal FDG uptake and no resting perfusion defects.   She contacted me 03/19/2018 to report some chest pain "off and on" starting the day before and sent remote device transmissions.   At her visit in 04/2018, she was feeling well overall. She had occasional symptoms that corresponded to atrial fibrillation, though she did not feel it was impacting her life significantly. She denied any additional lightheadedness. She preferred to continue metoprolol for atrial fibrillation.    She had a follow-up echocardiogram in early May 2020 showing normal LV function (details below).  Device interrogations continued to show an overall low burden of atrial fibrillation.   At follow-up 11/18/2018, she was feeing "pretty good". She had some sensation of an irregular heart beat at night affecting sleep. We increased her metoprolol evening dose. Device interrogation showed rare brief NSVT.    On 04/14/2019, she presented to the ED at [facility] with episodes of low blood pressure of 00F systolic at home over the prior several days and some "off or blurry" vision. She reportedly had no focal neurologic deficits, and per records from her hospitalization, it was not felt symptoms were consistent with stroke. She was treated with IV fluids and discharged from ED.    Remote device interrogation 04/16/2019 showed normal function, with 106 episodes of atrial fibrillation since 02/10/2019 (burden 1.7%).  The  longest formally measured episode was 3 hours 40 min on 03/11/2019, though based on the log I suspect she was continuously in atrial fibrillation from about midnight until 9 am on 04/13/2019; average ventricular rates were in the 80s.   At follow-up 04/21/2019, she was  "hanging in there."  During the recent atrial fibrillation episode, she felt an irregular heart rate, but not a "super high" pulse.  She did not take her metoprolol, being concerned about lowering her blood pressure further. She continued to have some shortness of breath with exertion, particularly at altitude, forcing her to rest more than she felt she should have to.  We recalled that previously enabling the rate-response feature on her pacemaker in 03/2017 made her feel anxious and jittery.  We reviewed rhythm control options, but she preferred to continue low-dose metoprolol to try to prevent episodes.   Device interrogation 10/12/2019 showed 54 high rate atrial episodes, with burden 3% -- the longest was 08/01/2019 for 6 hours and 24 minutes.  Histograms show predominant V rates 60-140 bpm with minimal counts in bins to 180 bpm during AF.  She contacted me with questions about 10/10/2019 after 11pm: episode logs show atrial fibrillation from about 1 am to about 6 am on 10/11/2019.    Today, Ms. Ramella reports fatigue and low blood pressure that prompted her to decrease her metoprolol back  to 12.5 mg twice daily.  She got the first dose of Pfizer Covid-19 vaccine in 06/2019 and had atrial fibrillation that night -- she plans to defer the second shot for the time being.  She does not feel she is sleeping well at night, with resultant fatigue, and inquires about disability.    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 - Oral    Class: Historical Med    metoprolol tartrate (LOPRESSOR) 25 mg tablet        Sig - Route: Take 12.5 mg by mouth 2 (two) times daily Take 1/2 tab in AM and 1 tab in PM   - Oral    Class: Historical Med    estradiol (ESTRING) 2 mg (7.5 mcg /24 hour) vaginal ring        Sig - Route: Place vaginally every 3 (three) months. Use as instructed - Vaginal    Class: Historical Med          ALLERGIES     Allergies as of 10/20/2019  Review status set to Review Complete by Etta Grandchild on 10/14/2019      Severity Noted Reaction Type Reactions    Sulfamethoxazole-trimethoprim Not Specified 10/18/2015    Itching    Insomnia          SOCIAL HISTORY     She reports that she has never smoked. She has never used smokeless tobacco. She reports current alcohol use of about 1.0 standard drinks of alcohol per week. She reports that she does not use drugs.    She is divorced.  She has 3 children.  She lives in [place], [place].  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    She has 2 horses (a Engineer, water and a [place], ages 34 and 25).  She also wakeboards in the summer.    FAMILY HISTORY     Her father underwent CABG in his late 36's and has cardiomyopathy.  He was a heavy smoker. He passed away in early 09-15-18.    Her mother had a heart attack around age 39.  She died after complications from a head injury in June 2016 after being knocked down by a horse.  Her grandfather had colon cancer.    REVIEW OF SYSTEMS     Gastrointestinal:  + IBS with cramping and gas.  No blood in the stool.  + polyps on colonoscopy in 2013-09-15.    PHYSICAL EXAMINATION     Constitutional: Patient appears well-developed and well-nourished. Pleasant and appropriately interactive.  Head: Normocephalic and  atraumatic.   Eyes: Conjunctivae and EOM are normal.   Neck: Normal range of motion.   Pulmonary/Chest: Effort normal. No respiratory distress. No cough.  Neurological: Alert and oriented to person, place, and time.   Psychiatric:  Normal mood and affect. Behavior is normal. Judgment and thought content normal.   Skin: No visible rash.     DATA     I reviewed prior 12-lead EKGs:  - 04/02/2017: competing sinus rhythm and atrial pacing at 60 bpm with intact AV conduction and right bundle branch block (QRSd 124 msec).  - 10/02/2016: atrial pacing at 61 bpm with intact AV conduction and right bundle branch block  - 11/29/2015: normal sinus rhythm with right bundle branch block at 64 bpm.  - 10/18/2015: sinus bradycardia at 57 bpm with right bundle branch block  - 08/29/2015: sinus bradycardia at 57 bpm with right bundle branch block.    Remote pacemaker interrogation 10/12/2019 showed 54 high rate atrial episodes, with 46% A-pacing and 4% V-pacing.  AT/AF burden was 3% -- the longest episode was 08/01/2019 for 6 hours and 24 minutes (though other shorter episodes may have been part of one longer episode, including on 10/11/2019).  Histograms show predominant V rates 60-140 bpm with minimal counts in bins to 180 bpm during AF.  Episodes from 09/2019 include:      Remote pacemaker interrogation 04/16/2019 showed normal overall function, with 48% atrial pacing and 2.9% ventricular pacing.  Total atrial fibrillation burden was 1.7% since 01/2019, with longest episode 3 hours 40 minutes on 03/11/2019.  There were multiple episodes 10/18-10/19/2020.  The longest episode within that time period measured formally was 1 h 20 min, though based on the log I suspect she was continuously in atrial fibrillation from ~midnight until ~9am on 04/13/2019.    Remote pacemaker interrogation 11/18/2018 (see separate full report in [facility]):    - She paces 45.4% in the RA (stable) and 4.6% in the RV (stable).    - Atrial fibrillation burden has been 0.9%, the  longest episode lasting 5 hours on 11/07/2018 with max V-rate of 130 bpm (max V-rate during all atrial fibrillation episodes 133 bpm)  - No NSVT recorded  - Many longer episodes were overnight      I reviewed the report and tracings from a 24-hour Holter monitor 08/31/2015 [facility]):  - Average HR 64.  Minimum 38.  Maximum 97.  Predominant rhythm sinus.  - Single PVC's occasional; no runs.  - Frequent blocked PACs with resultant bradycardia.  No SVT or atrial fibrillation.    - Occasional shortness of breath during periods of bradycardia with blocked PACs.  - On my review of tracings, several strips appear more consistent with sinus rhythm with 2:1 block than with blocked PAC's based on timing (though P-wave morphology is subtly different with blocked beats).  On tracings with both 2:1 and 1:1 conduction, the P-wave CL remains constant and ventricular rate exactly 1/2 during 2:1 block.  When transitioning from 1:1 to 2:1 block, the first blocked P-wave comes slightly early, but possibly due to increase in sinus rate.  - Less frequently (particularly 2:26am), bradycardia appears due to blocked PAC's in bigeminy.  - One period of 3:2 conduction (9:28am) without clear PR prolongation    I reviewed the reports of the following tests:   Echocardiogram 10/31/2018 ([place]):       Cardiac PET 02/17/2018:  1.   Normal perfusion in the left ventricle without myocardial uptake of FDG. Findings are suggestive of no active myocardial inflammatory process or scar.  2.    Normal left ventricular wall motion with a calculated left ventricular ejection fraction of >65% on rest perfusion images.     Cardiac  MRI 12/31/2017:  1. Mid wall and subepicardial late gadolinium enhancement in the lateral wall at the level of the base which can be seen with myocarditis or other inflammatory cardiomyopathy. Remaining myocardium demonstrates no evidence of scar.  2. Normal left ventricular function (ejection fraction  73%).     Echocardiogram 04/02/2017:  The patient's blood pressure was 154 mmHg/70 mmHg during the study. The heart rate during the study was 73. Color Flow Doppler was utilized for this exam. Spectral Doppler was utilized for this exam.  1. The left ventricular volume is normal. LV function is hyperdynamic. LV ejection fraction is estimated to be 70 to 75%.  2. The right ventricular volume is normal. Right ventricular function is normal.  3. Left atrial size is normal. Right atrial size is normal.  4. There is no hemodynamically significant valvular disease.  5. There is no Doppler evidence of LV diastolic dysfunction.  6. The RV systolic pressure is estimated to be 23 mmHg based on a right atrial pressure of 3 mmHg.  7. No pericardial effusion noted. The inferior vena cava is less than 21 mm in diameter and collapses with inspiration consistent with a right atrial pressure of 3 mmHg.  8. Aortic root dimension is normal.  - Compared to the previous study on 09/23/15, the prior study is a stress echo.     Exercise stress echocardiogram 09/23/2015 [facility]):  - Resting EKG sinus rhythm with right bundle branch block   - Resting echocardiogram: Normal LV size, wall thickness, and function, EF 60-65%.  Atria normal size.  RV normal.  Mild anterior leaflet MVP and mild MR.  No other significant valvular pathology.  No pericardial effusion.  - Walked 8 min 11 sec on [place], with peak HR 118 bpm early but development of 2:1 AV block at peak exercise with HR in the 60s and associated shortness of breath (2:1 block versus atrial bigeminy with block).  Returned to 1:1 conduction in recovery.  Episodes of supraventricular bigeminy noted.  - I reviewed the tracings:  - At 2:24, there is a period of 3:2 conduction with constant P-P interval and no clear PR prolongation, that appears to persist through 5:50  - HR then drops from 89 to 74 but P-waves difficult to see  - At 6:50 there appears to be 2:1 block with  occasional outflow tract PVC's  - At 7:50 there is suspicion for 2:1 conduction with blocked P-waves in prior T-waves.  - At 15 seconds into recovery there are periods with 1:1 conduction at double the rate of final exercise EKGs (5:4 and 6:5 conduction, but with little to no change in PR interval and R-R interval around blocked P-wave exactly double that of conducted intervals).  This apparent Mobitz type II second degree AV block becomes more apparent as the rate continues to slow in recovery.  - By 2 minutes into recovery there is clear 2:1 AV block that persists to 3 minutes, though with one episode of 3:2 block that appears slightly more consistent with Wenckebach based on the PR intervals and length of pause  - Stress echocardiogram:  Appropriate increase in contractility of all segments; no evidence of ischemia.     Chest CTA 09/22/2015 [facility]):  - Negative for PE or other explanation for shortness of breath.  - Few liver lesions, probably cysts.    - Ascending and descending aorta normal caliber     PFTs 09/08/2015:  - FVC, FEV1, FEV1/FVC ratio, and FEF25-75% within normal  limits  - TLC within normal limits  - FRC and RV increased  - With bronchodilators, there is significant response.  - Diffusing capacity high.  - Conclusions: Increased diffusing capacity consistent with left heart failure or shunting.  Although flow rates are within normal limits, the overinflation and response to bronchodilators are characteristic of reactive airways ("minimal")    I reviewed laboratory evaluation from [facility] dated 04/14/2019:  Na 141, K 3.8, Cl 105, CO2 32, BUN 17, Cr 0.6, Glu 100  LFTs normal (alk phos low at 44)  WBC 3.7, Hb 13.5, Plt 143  TnI <0.012  nT-BNP 238  U/A negative      ASSESSMENT AND RECOMMENDATIONS     In summary, Ms. Fischler is a 64 y.o. woman with exertional fatigue and shortness of breath, with evidence of Mobitz type II second degree AV block and periods of 2:1 block on stress testing and  Holter monitoring, status post dual-chamber pacemaker implant on 10/31/2015.  She was subsequently diagnosed with paroxysmal atrial fibrillation, as well as occasional NSVT.  Imaging evaluation showed mid-wall and epicardial LGE at the lateral base, but no perfusion defects or evidence of abnormal FDG uptake on subsequent PET scanning.    We focused our evaluation and discussion today on the following problems:    1) Atrial fibrillation:  - She had a prior history of relatively infrequent palpitations and was been diagnosed with paroxysmal atrial fibrillation via device interrogation.  The burden continues to slowly increase but remains low overall.  She is symptomatic with longer episodes.    - I have previously discussed the pathophysiology of atrial fibrillation and multiple management options with Ms. Richison. Given her associated symptoms, I would recommend that efforts be directed to maintaining sinus rhythm. She would be a good candidate for either a trial of antiarrhythmic drug therapy or catheter ablation.    - We reviewed in detail the potential risks and benefits of ablative therapy for atrial fibrillation (including pulmonary vein isolation).  For paroxysmal atrial fibrillation, the success rate of a single procedure for achieving durable sinus rhythm is 60-70%, and some patients have a better response to antiarrhythmic therapy after ablation. Success rates are often higher after a second or even third procedure.  The overall risk of complications is 1-2%, including vascular injury and bleeding, cardiac perforation and tamponade, phrenic nerve or esophageal injury, stroke, heart attack, and death.     After extensive discussion, Ms. Willy decided she would like to proceed with ablation -- she prefers to avoid adding new medications to her regimen given concerns for side effects.   We therefore agreed on the following plan:  - I will have the scheduling team contact her to schedule the procedure.  - She will  continue low-dose metoprolol to try to prevent episodes. She will also start therapeutic anticoagulation (see below).    2) Stroke prevention:  - Her CHA2DS2-VASc score is 1, for gender only.  This corresponds to a yearly stroke risk of 1.5% with no therapy, 1.2% with aspirin alone, and 0.3-0.5% on therapeutic anticoagulation.  - Given the low burden, aspirin alone remains reasonable overall. However, given the planned ablation procedure, I recommended she start therapeutic anticoagulation 1 month pre-procedure, with plan for continued therapy for at least 3 months post-procedure.  I provided a prescription for apixaban.    3) Mobitz type II second degree AV block with symptomatic bradycardia, status post pacemaker:  - The device is functioning normally based on remote interrogation.  Her ventricular pacing burden has remained low overall.  - She will continue routine pacemaker checks remotely and in the clinic.    4) Possible sinus node dysfunction:  - She has dyspnea on exertion, and rate histograms have shown overall slower sinus rates and increased atrial pacing burden.    - She may have some degree of chronotropic incompetence that contributes to exertional dyspnea, but felt poorly with rate-response enabled.  We could consider another trial of rate-response with less aggressive settings in the future.    5) NSVT:  - She had a symptomatic, 20-beat run of NSVT in 08/2017 associated with significant lightheadedness.  We initiated the metoprolol after that, and she has since had minimal brief NSVT.  - Given the combination of AV block, sinus node dysfunction, paroxysmal atrial fibrillation, and NSVT, I suggested further evaluation for inflammatory cardiomyopathy such as sarcoid.  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  The etiology of the LGE is not clear -- perhaps she had a  prior episode of myocarditis.    - The combination of the LGE and NSVT is obviously concerning, though she has normal LV function, no sustained ventricular arrhythmias, and no syncope.  Recent lightheadedness was clearly associated with an episode of atrial fibrillation. Since starting low dose metoprolol she has had minimal NSVT. I do not think there is an indication for ICD upgrade at this time.     6) Suspicion of sleep apnea:  - She does not have a snoring history, but with the nocturnal episodes of atrial fibrillation, I am suspicious for possible obstructive sleep apnea.    - I will refer her for a sleep study to see if a trial of CPAP is medically indicated.    It is a pleasure participating in Ms. Montalvo's care. I spent a total of 50 minutes on her care on the day of her visit excluding time spent related to any billed procedures. This time includes face-to-face time with the patient as well as time spent documenting in the medical record, reviewing the patient's records and tests, obtaining history, placing orders, communicating with other healthcare professionals, counseling the patient, family, or caregiver, and/or care coordination for the diagnoses above.    Please do not hesitate to contact me with any questions or concerns.    Estrellita Ludwig. Manus Rudd, MD, [facility], [facility]  Associate Professor of Clinical Medicine  Division of Cardiology  Cardiac Electrophysiology

## 2019-10-21 NOTE — Telephone Encounter (Signed)
Dear Debbie Harper,    We have scheduled you for the following procedure: ABLATION    Procedure Date:   Monday June 28th, 2021      Arrival time:    6:30AM       1. On the day of the procedure, please go to the Admissions Department on the first floor of [facility] at [address] room M140.    2. One visitor is able to escort you to your procedure location in the hospital. If the visitor would like to wait on campus during your procedure, the can be directed to wait in designated waiting areas on campus.     3. In preparation, please have your blood drawn within 1 week before the procedure. You do not have to fast for these blood tests. You can have these tests done at the following laboratory: OTHER LOCATION. Attached to this message are your lab orders. Please have your blood drawn any day between June 21st- 27th    4. Because your procedure is planned with anesthesia, our PREPARE clinic will call you to set up a consultation approximately 1-2 weeks prior to the procedure. They will also arrange required COVID-19 testing 4 days prior to the procedure.    5. Do not eat or drink anything after midnight the evening before your procedure (including gum, candy, or mints). You can take permitted medications with small sips of water, but nothing else.  ? CONTINUE TO TAKE all of your medications as directed  If you do not follow the above fasting and medication instructions your procedure will be cancelled for your safety.    6. Things to bring with you to your procedure:  a medication list, your insurance card, a CPAP machine if you use one and have been told you may stay the night, and a case for eyeglasses or contacts if you use them. Do not bring your medications (except inhalers if used), jewelry (it is safest NOT to bring your wedding ring, and keep it at home or with someone else during your procedure), or large amounts of cash. Please consider bringing as few items as possible into the hospital  building.    7. You will likely stay one night in the hospital for observation and be discharged around 10 or 11AM the following day    8. Directions to our facility can be obtained on the following website: [email or web address]    9. We have scheduled you for a follow up video visit on 02/02/2020 at 1:15PM. Please see separate instructions for video visit     Please call us if you have any questions.  Electrophysiology Procedure Scheduling Line: [phone]

## 2019-11-04 ENCOUNTER — Other Ambulatory Visit: Payer: Self-pay | Admitting: Nurse Practitioner

## 2019-11-04 ENCOUNTER — Ambulatory Visit (INDEPENDENT_AMBULATORY_CARE_PROVIDER_SITE_OTHER): Payer: 59 | Admitting: Nurse Practitioner

## 2019-11-04 ENCOUNTER — Other Ambulatory Visit: Payer: Self-pay

## 2019-11-04 ENCOUNTER — Encounter: Payer: Self-pay | Admitting: Nurse Practitioner

## 2019-11-04 VITALS — BP 150/100 | HR 96 | Temp 97.2°F | Ht 61.0 in | Wt 167.2 lb

## 2019-11-04 DIAGNOSIS — R197 Diarrhea, unspecified: Secondary | ICD-10-CM | POA: Diagnosis not present

## 2019-11-04 DIAGNOSIS — E1169 Type 2 diabetes mellitus with other specified complication: Secondary | ICD-10-CM | POA: Diagnosis not present

## 2019-11-04 DIAGNOSIS — E785 Hyperlipidemia, unspecified: Secondary | ICD-10-CM | POA: Diagnosis not present

## 2019-11-04 DIAGNOSIS — E119 Type 2 diabetes mellitus without complications: Secondary | ICD-10-CM | POA: Diagnosis not present

## 2019-11-04 DIAGNOSIS — G40909 Epilepsy, unspecified, not intractable, without status epilepticus: Secondary | ICD-10-CM | POA: Diagnosis not present

## 2019-11-04 DIAGNOSIS — R030 Elevated blood-pressure reading, without diagnosis of hypertension: Secondary | ICD-10-CM | POA: Diagnosis not present

## 2019-11-04 DIAGNOSIS — K439 Ventral hernia without obstruction or gangrene: Secondary | ICD-10-CM | POA: Diagnosis not present

## 2019-11-04 DIAGNOSIS — D509 Iron deficiency anemia, unspecified: Secondary | ICD-10-CM | POA: Diagnosis not present

## 2019-11-04 DIAGNOSIS — I10 Essential (primary) hypertension: Secondary | ICD-10-CM

## 2019-11-04 MED ORDER — AMLODIPINE BESYLATE 10 MG PO TABS: 10.0000 mg | ORAL_TABLET | Freq: Every day | ORAL | 3 refills | Status: DC

## 2019-11-04 MED ORDER — CLONIDINE HCL 0.1 MG PO TABS
0.1000 mg | ORAL_TABLET | Freq: Once | ORAL | Status: AC
  Administered 2019-11-04: 17:00:00 0.1 mg via ORAL

## 2019-11-04 MED ORDER — CLONIDINE HCL 0.1 MG PO TABS: 0.1000 mg | ORAL_TABLET | Freq: Once | ORAL | 0 refills | Status: DC

## 2019-11-04 NOTE — Progress Notes (Signed)
Careteam: Patient Care Team: Lauree Chandler, NP as PCP - General (Geriatric Medicine)  PLACE OF SERVICE:  [place] Directive information Does Patient Have a Medical Advance Directive?: Yes, Would patient like information on creating a medical advance directive?: No - Patient declined, Type of Advance Directive: [place], Does patient want to make changes to medical advance directive?: No - Patient declined  Allergies  Allergen Reactions  . Simvastatin Other (See Comments)    Stomach pain, low back pain   . Shrimp [Shellfish Allergy] Other (See Comments)    Swelling and hypersensitivity    Chief Complaint  Patient presents with  . Medical Management of Chronic Issues    6 Month Follow Up  . Health Maintenance    Discuss the need for Eye Exam, Mammogram, and Hemoglobin A1C  . Immunizations    Disucss the need for Tetanus Vaccine.     HPI: Patient is a 64 y.o. female presents today for 6 month follow up. Pt is going to have open ventral hernia repair in June.  DM- pt has not checked her blood sugar lately. When she does check it, they run 70s-100s. Pt is also not able to eat like she wants due to the hernia discomfort.   Anemia- no concerns, pt is taking iron daily  HLD- no concerns, currently taking Zetia and Crestor, not having any myalgias   HTN- Elevated today, 140/100. Pt does not take her BP at home. She has been rushing today from work. Pt is taking her Lisinopril 30 mg.   DM- stable, no concerns. Pt is taking her Metformin 1000 mg daily  Seizure- no recent seizures, continues to take Keppra. Pt will miss a dose or two a month.  Ventral hernia- pt is having a great deal of discomfort from the hernia. She is very nauseated. Pt is not able to eat much either. Has been following with surgery and plans to have open surgery for repair.   Arthritis- pain in her knees but mostly her back. She is taking an occasional Aleve for the  pain.   Diarrhea- pt is still having loose stools, ever since she had her gallbladder removed 2 years ago.  Review of Systems:  Review of Systems  Constitutional: Negative for chills, fever and malaise/fatigue.  HENT: Negative.   Eyes: Positive for blurred vision (both eyes, plans to follow up with eye doctor).  Respiratory: Negative for cough and shortness of breath.   Cardiovascular: Positive for palpitations (occasionally- dull pain as well. Not new). Negative for chest pain.  Gastrointestinal: Positive for abdominal pain, diarrhea (pt had colonscopy 2 months ago- couple polyps) and nausea. Negative for blood in stool, constipation, heartburn and melena.  Musculoskeletal: Negative for back pain.  Neurological: Negative for dizziness, weakness and headaches.  Psychiatric/Behavioral: Negative for depression. The patient is not nervous/anxious.     Past Medical History:  Diagnosis Date  . Allergy   . Anemia, unspecified   . Asthma    as a child, no problem as adult, no inhaler  . Bradycardia    past hx   . Diabetes mellitus    Type II  . History of blood transfusion    pre hysterectomy  . Osteoarthrosis, unspecified whether generalized or localized, unspecified site   . Other and unspecified hyperlipidemia   . Other convulsions    last one 2013  . Other malaise and fatigue   . Other specified cardiac dysrhythmias(427.89) 2006   "history  of Bradycardia" .  wore a mointor nothing found  . Seizure disorder ([place])    last seizure 2013 per pt 05-12-2019 PV   . Unspecified essential hypertension   . Unspecified vitamin D deficiency    Past Surgical History:  Procedure Laterality Date  . ABDOMINAL HYSTERECTOMY    . APPENDECTOMY    . BREAST LUMPECTOMY Right 1997  . BREAST LUMPECTOMY Bilateral    neg for cancer  . CHOLECYSTECTOMY N/A 02/28/2016   Procedure: LAPAROSCOPIC CHOLECYSTECTOMY;  Surgeon: Mickeal Skinner, MD;  Location: [place];  Service: General;  Laterality: N/A;  .  IR CATHETER TUBE CHANGE  01/02/2018  . IR RADIOLOGIST EVAL & MGMT  12/25/2017  . IR RADIOLOGIST EVAL & MGMT  01/08/2018  . LAPAROSCOPIC APPENDECTOMY N/A 01/15/2018   Procedure: LAPAROSCOPIC APPENDECTOMY CONVERTED TO OPEN APPENDECTOMY SMALL BOWEL RESECTION;  Surgeon: Donnie Mesa, MD;  Location: [facility];  Service: General;  Laterality: N/A;   Social History:   reports that she has never smoked. She has never used smokeless tobacco. She reports that she does not drink alcohol or use drugs.  Family History  Problem Relation Age of Onset  . Cancer Father        Esophageal  . Esophageal cancer Father   . Colon polyps Neg Hx   . Colon cancer Neg Hx   . Rectal cancer Neg Hx   . Stomach cancer Neg Hx     Medications: Patient's Medications  New Prescriptions   No medications on file  Previous Medications   BIOTIN PO    Take 1 tablet by mouth daily.   BLOOD GLUCOSE MONITORING SUPPL (FREESTYLE LITE) DEVI    Use to test blood sugar twice daily. Dx: E11.9   CETIRIZINE (ZYRTEC) 10 MG TABLET    Take 10 mg by mouth at bedtime.   EZETIMIBE (ZETIA) 10 MG TABLET    Take one tablet by mouth once daily.   FERROUS SULFATE 325 (65 FE) MG TABLET    Take 1 tablet (325 mg total) by mouth 2 (two) times daily with a meal.   GLUCOSE BLOOD (FREESTYLE LITE) TEST STRIP    Use to test blood sugar twice daily. Dx: E11.9   GLUCOSE BLOOD TEST STRIP    Use as instructed   LANCETS (FREESTYLE) LANCETS    Use to test blood sugar twice daily. Dx: E11.9   LEVETIRACETAM (KEPPRA) 500 MG TABLET    TAKE 1 TABLET BY MOUTH EVERY 12 HOURS FOR SEIZURES   LISINOPRIL (ZESTRIL) 30 MG TABLET    Take one tablet by mouth once daily for blood pressure.   METFORMIN (GLUCOPHAGE) 1000 MG TABLET    TAKE 1 TABLET BY MOUTH 2 TIMES DAILY WITH MEALS TO CONTROL BLOOD SUGAR   ROSUVASTATIN (CRESTOR) 5 MG TABLET    Take 0.5 tablets (2.5 mg total) by mouth at bedtime.  Modified Medications   No medications on file  Discontinued Medications   No  medications on file    Physical Exam:  Vitals:   11/04/19 1536  BP: (!) 140/100  Pulse: 96  Temp: (!) 97.2 F (36.2 C)  TempSrc: Temporal  SpO2: 98%  Weight: 167 lb 3.2 oz (75.8 kg)  Height: 5\' 1"  (1.549 m)   Body mass index is 31.59 kg/m. Wt Readings from Last 3 Encounters:  11/04/19 167 lb 3.2 oz (75.8 kg)  08/25/19 170 lb (77.1 kg)  05/28/19 164 lb (74.4 kg)    Physical Exam Cardiovascular:     Rate  and Rhythm: Normal rate and regular rhythm.     Pulses: Normal pulses.     Heart sounds: Normal heart sounds.  Pulmonary:     Effort: Pulmonary effort is normal.     Breath sounds: Normal breath sounds.  Abdominal:     General: A surgical scar is present.     Palpations: Abdomen is soft.     Tenderness: There is no abdominal tenderness. There is no guarding.     Hernia: A hernia is present.  Skin:    General: Skin is warm and dry.  Neurological:     Mental Status: She is alert and oriented to person, place, and time.  Psychiatric:        Attention and Perception: Attention normal.        Mood and Affect: Mood normal.        Speech: Speech normal.        Behavior: Behavior normal. Behavior is cooperative.        Thought Content: Thought content normal.        Cognition and Memory: Cognition and memory normal.     Labs reviewed: Basic Metabolic Panel: Recent Labs    05/06/19 1558  NA 139  K 4.5  CL 108  CO2 23  GLUCOSE 100  BUN 13  CREATININE 0.92  CALCIUM 10.0   Liver Function Tests: Recent Labs    05/06/19 1558  AST 18  ALT 14  BILITOT 0.5  PROT 7.2   No results for input(s): LIPASE, AMYLASE in the last 8760 hours. No results for input(s): AMMONIA in the last 8760 hours. CBC: Recent Labs    01/01/19 0802 05/06/19 1558  WBC 5.8 6.5  NEUTROABS 2,610 2,828  HGB 11.6* 12.0  HCT 36.5 37.5  MCV 81.8 80.5  PLT 296 276   Lipid Panel: Recent Labs    05/06/19 1558  CHOL 181  HDL 59  LDLCALC 101*  TRIG 113  CHOLHDL 3.1   TSH: No  results for input(s): TSH in the last 8760 hours. A1C: Lab Results  Component Value Date   HGBA1C 6.4 (H) 05/06/2019     Assessment/Plan 1. Hyperlipidemia associated with type 2 diabetes mellitus ([place]) -Continue Crestor and Zetia with dietary modifications due for follow up lab, fasting today.  - Lipid Panel - COMPLETE METABOLIC PANEL WITH GFR  2. Seizure disorder ([place]) -Stable,no recent seizures continue Keppra  3. Ventral hernia without obstruction or gangrene -stable, Pt is having open ventral surgery next month. Aware of precautions as to when to seek immediate medical attention  4. Diarrhea, unspecified type -Controlled, Pt has had this for 2 years since her gallbladder removed. Low fat diet advised, may could benefit from increase fiber (benefiber)   5. Essential hypertension -Rechecked pt BP today L arm 200/120, R arm 190/120, clonidine 0.1 given in office with improvement in blood pressure to 150/100.  -norvasc 10 mg by mouth daily prescribed and encouraged to start today. - EKG 12-Lead-Sinus Rhythm, Rate 64, No acute abnormal findings noted.  -to monitor blood pressure daily after medication   6. Controlled type 2 diabetes mellitus without complication, without long-term current use of insulin (HCC) -Continue Metformin 1000 mg tablet daily, no hypoglycemia -Encouraged dietary compliance, routine foot care/monitoring and to keep up with diabetic eye exams through ophthalmology  -Last A1c 6.4 - Hemoglobin A1c  7. Iron deficiency anemia, unspecified iron deficiency anemia type -Stable without signs of bleeding, Continue Iron supplement 325 mg tablet daily - CBC with  Differential/Platelet   Next appt: 2 weeks(or sooner), sooner if needed to follow up on blood pressure Toree Edling K. [place] [facility], [facility] [phone]   I personally was present during the history, physical exam and medical decision-making activities of this service and have  verified that the service and findings are accurately documented in the student's note  Total time 45 mins:  time greater than 50% of total time spent doing pt counseling and coordination of care.

## 2019-11-04 NOTE — Patient Instructions (Signed)
Start norvasc 10 mg daily to get blood pressure under better control.   Continue with lisinopril 30 mg daily  Follow up in 2 weeks on blood pressure Check blood pressure and bring readings with you to visit.   DASH Eating Plan DASH stands for "Dietary Approaches to Stop Hypertension." The DASH eating plan is a healthy eating plan that has been shown to reduce high blood pressure (hypertension). It may also reduce your risk for type 2 diabetes, heart disease, and stroke. The DASH eating plan may also help with weight loss. What are tips for following this plan?  General guidelines  Avoid eating more than 2,300 mg (milligrams) of salt (sodium) a day. If you have hypertension, you may need to reduce your sodium intake to 1,500 mg a day.  Limit alcohol intake to no more than 1 drink a day for nonpregnant women and 2 drinks a day for men. One drink equals 12 oz of beer, 5 oz of wine, or 1 oz of hard liquor.  Work with your health care provider to maintain a healthy body weight or to lose weight. Ask what an ideal weight is for you.  Get at least 30 minutes of exercise that causes your heart to beat faster (aerobic exercise) most days of the week. Activities may include walking, swimming, or biking.  Work with your health care provider or diet and nutrition specialist (dietitian) to adjust your eating plan to your individual calorie needs. Reading food labels   Check food labels for the amount of sodium per serving. Choose foods with less than 5 percent of the Daily Value of sodium. Generally, foods with less than 300 mg of sodium per serving fit into this eating plan.  To find whole grains, look for the word "whole" as the first word in the ingredient list. Shopping  Buy products labeled as "low-sodium" or "no salt added."  Buy fresh foods. Avoid canned foods and premade or frozen meals. Cooking  Avoid adding salt when cooking. Use salt-free seasonings or herbs instead of table salt or  sea salt. Check with your health care provider or pharmacist before using salt substitutes.  Do not fry foods. Cook foods using healthy methods such as baking, boiling, grilling, and broiling instead.  Cook with heart-healthy oils, such as olive, canola, soybean, or sunflower oil. Meal planning  Eat a balanced diet that includes: ? 5 or more servings of fruits and vegetables each day. At each meal, try to fill half of your plate with fruits and vegetables. ? Up to 6-8 servings of whole grains each day. ? Less than 6 oz of lean meat, poultry, or fish each day. A 3-oz serving of meat is about the same size as a deck of cards. One egg equals 1 oz. ? 2 servings of low-fat dairy each day. ? A serving of nuts, seeds, or beans 5 times each week. ? Heart-healthy fats. Healthy fats called Omega-3 fatty acids are found in foods such as flaxseeds and coldwater fish, like sardines, salmon, and mackerel.  Limit how much you eat of the following: ? Canned or prepackaged foods. ? Food that is high in trans fat, such as fried foods. ? Food that is high in saturated fat, such as fatty meat. ? Sweets, desserts, sugary drinks, and other foods with added sugar. ? Full-fat dairy products.  Do not salt foods before eating.  Try to eat at least 2 vegetarian meals each week.  Eat more home-cooked food and less restaurant, buffet,  and fast food.  When eating at a restaurant, ask that your food be prepared with less salt or no salt, if possible. What foods are recommended? The items listed may not be a complete list. Talk with your dietitian about what dietary choices are best for you. Grains Whole-grain or whole-wheat bread. Whole-grain or whole-wheat pasta. Brown rice. [place]. Bulgur. Whole-grain and low-sodium cereals. Pita bread. Low-fat, low-sodium crackers. Whole-wheat flour tortillas. Vegetables Fresh or frozen vegetables (raw, steamed, roasted, or grilled). Low-sodium or reduced-sodium  tomato and vegetable juice. Low-sodium or reduced-sodium tomato sauce and tomato paste. Low-sodium or reduced-sodium canned vegetables. Fruits All fresh, dried, or frozen fruit. Canned fruit in natural juice (without added sugar). Meat and other protein foods Skinless chicken or [place]. Ground chicken or [place]. Pork with fat trimmed off. Fish and seafood. Egg whites. Dried beans, peas, or lentils. Unsalted nuts, nut butters, and seeds. Unsalted canned beans. Lean cuts of beef with fat trimmed off. Low-sodium, lean deli meat. Dairy Low-fat (1%) or fat-free (skim) milk. Fat-free, low-fat, or reduced-fat cheeses. Nonfat, low-sodium ricotta or cottage cheese. Low-fat or nonfat yogurt. Low-fat, low-sodium cheese. Fats and oils Soft margarine without trans fats. Vegetable oil. Low-fat, reduced-fat, or light mayonnaise and salad dressings (reduced-sodium). Canola, safflower, olive, soybean, and sunflower oils. Avocado. Seasoning and other foods Herbs. Spices. Seasoning mixes without salt. Unsalted popcorn and pretzels. Fat-free sweets. What foods are not recommended? The items listed may not be a complete list. Talk with your dietitian about what dietary choices are best for you. Grains Baked goods made with fat, such as croissants, muffins, or some breads. Dry pasta or rice meal packs. Vegetables Creamed or fried vegetables. Vegetables in a cheese sauce. Regular canned vegetables (not low-sodium or reduced-sodium). Regular canned tomato sauce and paste (not low-sodium or reduced-sodium). Regular tomato and vegetable juice (not low-sodium or reduced-sodium). Angie Fava. Olives. Fruits Canned fruit in a light or heavy syrup. Fried fruit. Fruit in cream or butter sauce. Meat and other protein foods Fatty cuts of meat. Ribs. Fried meat. Berniece Salines. Sausage. Bologna and other processed lunch meats. Salami. Fatback. Hotdogs. Bratwurst. Salted nuts and seeds. Canned beans with added salt. Canned or smoked fish.  Whole eggs or egg yolks. Chicken or [place] with skin. Dairy Whole or 2% milk, cream, and half-and-half. Whole or full-fat cream cheese. Whole-fat or sweetened yogurt. Full-fat cheese. Nondairy creamers. Whipped toppings. Processed cheese and cheese spreads. Fats and oils Butter. Stick margarine. Lard. Shortening. Ghee. Bacon fat. Tropical oils, such as coconut, palm kernel, or palm oil. Seasoning and other foods Salted popcorn and pretzels. Onion salt, garlic salt, seasoned salt, table salt, and sea salt. Worcestershire sauce. Tartar sauce. Barbecue sauce. Teriyaki sauce. Soy sauce, including reduced-sodium. Steak sauce. Canned and packaged gravies. Fish sauce. Oyster sauce. Cocktail sauce. Horseradish that you find on the shelf. Ketchup. Mustard. Meat flavorings and tenderizers. Bouillon cubes. Hot sauce and Tabasco sauce. Premade or packaged marinades. Premade or packaged taco seasonings. Relishes. Regular salad dressings. Where to find more information:  National Heart, Lung, and [place]: https://wilson-eaton.com/  American Heart Association: www.heart.org Summary  The DASH eating plan is a healthy eating plan that has been shown to reduce high blood pressure (hypertension). It may also reduce your risk for type 2 diabetes, heart disease, and stroke.  With the DASH eating plan, you should limit salt (sodium) intake to 2,300 mg a day. If you have hypertension, you may need to reduce your sodium intake to 1,500 mg a day.  When on  the DASH eating plan, aim to eat more fresh fruits and vegetables, whole grains, lean proteins, low-fat dairy, and heart-healthy fats.  Work with your health care provider or diet and nutrition specialist (dietitian) to adjust your eating plan to your individual calorie needs. This information is not intended to replace advice given to you by your health care provider. Make sure you discuss any questions you have with your health care provider. Document Revised:  05/24/2017 Document Reviewed: 06/04/2016 Elsevier Patient Education  2020 [employer].

## 2019-11-05 LAB — COMPLETE METABOLIC PANEL WITH GFR
AG Ratio: 1.6 (calc) (ref 1.0–2.5)
ALT: 15 U/L (ref 6–29)
AST: 18 U/L (ref 10–35)
Albumin: 4.2 g/dL (ref 3.6–5.1)
Alkaline phosphatase (APISO): 61 U/L (ref 37–153)
BUN: 18 mg/dL (ref 7–25)
CO2: 28 mmol/L (ref 20–32)
Calcium: 9.5 mg/dL (ref 8.6–10.4)
Chloride: 105 mmol/L (ref 98–110)
Creat: 0.83 mg/dL (ref 0.50–0.99)
GFR, Est African American: 86 mL/min/{1.73_m2} (ref >=60)
GFR, Est Non African American: 75 mL/min/{1.73_m2} (ref >=60)
Globulin: 2.6 g/dL (calc) (ref 1.9–3.7)
Glucose, Bld: 99 mg/dL (ref 65–99)
Potassium: 3.7 mmol/L (ref 3.5–5.3)
Sodium: 141 mmol/L (ref 135–146)
Total Bilirubin: 0.4 mg/dL (ref 0.2–1.2)
Total Protein: 6.8 g/dL (ref 6.1–8.1)

## 2019-11-05 LAB — CBC WITH DIFFERENTIAL/PLATELET
Absolute Monocytes: 374 cells/uL (ref 200–950)
Basophils Absolute: 61 cells/uL (ref 0–200)
Basophils Relative: 0.9 %
Eosinophils Absolute: 796 cells/uL — ABNORMAL HIGH (ref 15–500)
Eosinophils Relative: 11.7 %
HCT: 35.7 % (ref 35.0–45.0)
Hemoglobin: 11.5 g/dL — ABNORMAL LOW (ref 11.7–15.5)
Lymphs Abs: 2836 cells/uL (ref 850–3900)
MCH: 25.7 pg — ABNORMAL LOW (ref 27.0–33.0)
MCHC: 32.2 g/dL (ref 32.0–36.0)
MCV: 79.9 fL — ABNORMAL LOW (ref 80.0–100.0)
MPV: 10.6 fL (ref 7.5–12.5)
Monocytes Relative: 5.5 %
Neutro Abs: 2734 cells/uL (ref 1500–7800)
Neutrophils Relative %: 40.2 %
Platelets: 275 10*3/uL (ref 140–400)
RBC: 4.47 10*6/uL (ref 3.80–5.10)
RDW: 13.4 % (ref 11.0–15.0)
Total Lymphocyte: 41.7 %
WBC: 6.8 10*3/uL (ref 3.8–10.8)

## 2019-11-05 LAB — LIPID PANEL
Cholesterol: 116 mg/dL (ref <200)
HDL: 58 mg/dL (ref >=50)
LDL Cholesterol (Calc): 42 mg/dL (calc)
Non-HDL Cholesterol (Calc): 58 mg/dL (calc) (ref <130)
Total CHOL/HDL Ratio: 2 (calc) (ref <5.0)
Triglycerides: 81 mg/dL (ref <150)

## 2019-11-05 LAB — HEMOGLOBIN A1C
Hgb A1c MFr Bld: 6.4 % of total Hgb — ABNORMAL HIGH (ref <5.7)
Mean Plasma Glucose: 137 (calc)
eAG (mmol/L): 7.6 (calc)

## 2019-11-16 ENCOUNTER — Ambulatory Visit (INDEPENDENT_AMBULATORY_CARE_PROVIDER_SITE_OTHER): Payer: 59 | Admitting: Nurse Practitioner

## 2019-11-16 ENCOUNTER — Encounter: Payer: Self-pay | Admitting: Nurse Practitioner

## 2019-11-16 ENCOUNTER — Other Ambulatory Visit: Payer: Self-pay | Admitting: Nurse Practitioner

## 2019-11-16 ENCOUNTER — Other Ambulatory Visit: Payer: Self-pay

## 2019-11-16 VITALS — BP 138/86 | HR 92 | Temp 96.8°F | Ht 61.0 in | Wt 169.0 lb

## 2019-11-16 DIAGNOSIS — I10 Essential (primary) hypertension: Secondary | ICD-10-CM | POA: Diagnosis not present

## 2019-11-16 DIAGNOSIS — E785 Hyperlipidemia, unspecified: Secondary | ICD-10-CM

## 2019-11-16 MED ORDER — ROSUVASTATIN CALCIUM 5 MG PO TABS: 2.5000 mg | ORAL_TABLET | Freq: Every day | ORAL | 1 refills | Status: DC

## 2019-11-16 MED ORDER — LISINOPRIL 30 MG PO TABS: 30.0000 mg | ORAL_TABLET | Freq: Every day | ORAL | 1 refills | Status: DC

## 2019-11-16 MED FILL — LISINOPRIL 30 MG TABS: 30 | 90 days supply | Qty: 90 | Fill #0

## 2019-11-16 MED FILL — ROSUVASTATIN CALCIUM 5 MG T: 5 | 90 days supply | Qty: 45 | Fill #2

## 2019-11-16 NOTE — Progress Notes (Signed)
Careteam: Patient Care Team: Lauree Chandler, NP as PCP - General (Geriatric Medicine)  PLACE OF SERVICE:  [facility]  Advanced Directive information    Allergies  Allergen Reactions  . Simvastatin Other (See Comments)    Stomach pain, low back pain   . Shrimp [Shellfish Allergy] Other (See Comments)    Swelling and hypersensitivity    Chief Complaint  Patient presents with  . Follow-up    2 week blood pressure follow-up      HPI: Patient is a 64 y.o. female for blood pressure follow up.  At last visit sbp was 200/120. She was given clonidine in office and prescribed norvasc 10 mg daily.  She is taking norvasc 10 mg daily in the evening. sbp at work was around 120 when she took early today.  No side effects, swelling LE.  She was having headaches at last visit. No headaches since she has been on norvasc.  Overall feeling better.   Review of Systems:  Review of Systems  Constitutional: Negative for chills, fever and weight loss.  Respiratory: Negative for cough, sputum production and shortness of breath.   Cardiovascular: Negative for chest pain, palpitations and leg swelling.  Gastrointestinal: Negative for abdominal pain, constipation and diarrhea.  Skin: Negative.   Neurological: Negative for dizziness and headaches.    Past Medical History:  Diagnosis Date  . Allergy   . Anemia, unspecified   . Asthma    as a child, no problem as adult, no inhaler  . Bradycardia    past hx   . Diabetes mellitus    Type II  . History of blood transfusion    pre hysterectomy  . Osteoarthrosis, unspecified whether generalized or localized, unspecified site   . Other and unspecified hyperlipidemia   . Other convulsions    last one 2013  . Other malaise and fatigue   . Other specified cardiac dysrhythmias(427.89) 2006   "history of Bradycardia" .  wore a mointor nothing found  . Seizure disorder ([place])    last seizure 2013 per pt 05-12-2019 PV   . Unspecified  essential hypertension   . Unspecified vitamin D deficiency    Past Surgical History:  Procedure Laterality Date  . ABDOMINAL HYSTERECTOMY    . APPENDECTOMY    . BREAST LUMPECTOMY Right 1997  . BREAST LUMPECTOMY Bilateral    neg for cancer  . CHOLECYSTECTOMY N/A 02/28/2016   Procedure: LAPAROSCOPIC CHOLECYSTECTOMY;  Surgeon: Mickeal Skinner, MD;  Location: [facility];  Service: General;  Laterality: N/A;  . IR CATHETER TUBE CHANGE  01/02/2018  . IR RADIOLOGIST EVAL & MGMT  12/25/2017  . IR RADIOLOGIST EVAL & MGMT  01/08/2018  . LAPAROSCOPIC APPENDECTOMY N/A 01/15/2018   Procedure: LAPAROSCOPIC APPENDECTOMY CONVERTED TO OPEN APPENDECTOMY SMALL BOWEL RESECTION;  Surgeon: Donnie Mesa, MD;  Location: [facility];  Service: General;  Laterality: N/A;   Social History:   reports that she has never smoked. She has never used smokeless tobacco. She reports that she does not drink alcohol or use drugs.  Family History  Problem Relation Age of Onset  . Cancer Father        Esophageal  . Esophageal cancer Father   . Colon polyps Neg Hx   . Colon cancer Neg Hx   . Rectal cancer Neg Hx   . Stomach cancer Neg Hx     Medications: Patient's Medications  New Prescriptions   No medications on file  Previous Medications   AMLODIPINE (  NORVASC) 10 MG TABLET    Take 1 tablet (10 mg total) by mouth daily.   BIOTIN PO    Take 1 tablet by mouth daily.   BLOOD GLUCOSE MONITORING SUPPL (FREESTYLE LITE) DEVI    Use to test blood sugar twice daily. Dx: E11.9   CETIRIZINE (ZYRTEC) 10 MG TABLET    Take 10 mg by mouth at bedtime.   EZETIMIBE (ZETIA) 10 MG TABLET    Take one tablet by mouth once daily.   FERROUS SULFATE 325 (65 FE) MG TABLET    Take 1 tablet (325 mg total) by mouth 2 (two) times daily with a meal.   GLUCOSE BLOOD (FREESTYLE LITE) TEST STRIP    Use to test blood sugar twice daily. Dx: E11.9   GLUCOSE BLOOD TEST STRIP    Use as instructed   LANCETS (FREESTYLE) LANCETS    Use to test blood sugar  twice daily. Dx: E11.9   LEVETIRACETAM (KEPPRA) 500 MG TABLET    TAKE 1 TABLET BY MOUTH EVERY 12 HOURS FOR SEIZURES   LISINOPRIL (ZESTRIL) 30 MG TABLET    TAKE ONE TABLET BY MOUTH ONCE DAILY FOR BLOOD PRESSURE.   METFORMIN (GLUCOPHAGE) 1000 MG TABLET    TAKE 1 TABLET BY MOUTH 2 TIMES DAILY WITH MEALS TO CONTROL BLOOD SUGAR   ROSUVASTATIN (CRESTOR) 5 MG TABLET    Take 0.5 tablets (2.5 mg total) by mouth at bedtime.  Modified Medications   No medications on file  Discontinued Medications   No medications on file    Physical Exam:  Vitals:   11/16/19 1535 11/16/19 1540  BP: (!) 142/88 138/86  Pulse: 92   Temp: (!) 96.8 F (36 C)   TempSrc: Temporal   SpO2: 98%   Weight: 169 lb (76.7 kg)   Height: 5\' 1"  (1.549 m)    Body mass index is 31.93 kg/m. Wt Readings from Last 3 Encounters:  11/16/19 169 lb (76.7 kg)  11/04/19 167 lb 3.2 oz (75.8 kg)  08/25/19 170 lb (77.1 kg)    Physical Exam Constitutional:      General: She is not in acute distress.    Appearance: She is well-developed. She is not diaphoretic.  HENT:     Head: Normocephalic and atraumatic.  Cardiovascular:     Rate and Rhythm: Normal rate and regular rhythm.     Heart sounds: Normal heart sounds.  Pulmonary:     Effort: Pulmonary effort is normal.     Breath sounds: Normal breath sounds.  Abdominal:     General: Bowel sounds are normal.     Palpations: Abdomen is soft.  Musculoskeletal:     Cervical back: Normal range of motion and neck supple.     Right lower leg: No edema.     Left lower leg: No edema.  Skin:    General: Skin is warm and dry.  Neurological:     Mental Status: She is alert and oriented to person, place, and time.     Labs reviewed: Basic Metabolic Panel: Recent Labs    05/06/19 1558 11/04/19 1631  NA 139 141  K 4.5 3.7  CL 108 105  CO2 23 28  GLUCOSE 100 99  BUN 13 18  CREATININE 0.92 0.83  CALCIUM 10.0 9.5   Liver Function Tests: Recent Labs    05/06/19 1558  11/04/19 1631  AST 18 18  ALT 14 15  BILITOT 0.5 0.4  PROT 7.2 6.8   No results for input(s): LIPASE,  AMYLASE in the last 8760 hours. No results for input(s): AMMONIA in the last 8760 hours. CBC: Recent Labs    01/01/19 0802 05/06/19 1558 11/04/19 1631  WBC 5.8 6.5 6.8  NEUTROABS 2,610 2,828 2,734  HGB 11.6* 12.0 11.5*  HCT 36.5 37.5 35.7  MCV 81.8 80.5 79.9*  PLT 296 276 275   Lipid Panel: Recent Labs    05/06/19 1558 11/04/19 1631  CHOL 181 116  HDL 59 58  LDLCALC 101* 42  TRIG 113 81  CHOLHDL 3.1 2.0   TSH: No results for input(s): TSH in the last 8760 hours. A1C: Lab Results  Component Value Date   HGBA1C 6.4 (H) 11/04/2019     Assessment/Plan 1. Hyperlipidemia LDL goal <70 Now at goal on crestor. Pt tolerating well.  - rosuvastatin (CRESTOR) 5 MG tablet; Take 0.5 tablets (2.5 mg total) by mouth at bedtime.  Dispense: 45 tablet; Refill: 1  2. Essential hypertension -improved with addition of norvasc 10 mg daily. Continue on lisinipril and diet modifications.  - lisinopril (ZESTRIL) 30 MG tablet; Take 1 tablet (30 mg total) by mouth daily.  Dispense: 90 tablet; Refill: 1  Next appt: 6 months, sooner if needed  Sarah Brock K. [place], [facility] [phone]

## 2019-11-19 ENCOUNTER — Ambulatory Visit: Payer: 59 | Admitting: Family

## 2019-12-03 ENCOUNTER — Telehealth: Payer: Self-pay

## 2019-12-03 ENCOUNTER — Other Ambulatory Visit (HOSPITAL_COMMUNITY): Payer: 59

## 2019-12-03 NOTE — Telephone Encounter (Signed)
Lauree Chandler, NP  Leigh Aurora C, CMA Can we call pt and remind her that she is overdue for her mammogram please    I called patient to inquire about scheduling a mammogram. Patient states she is a nervous wreck, focused on having a hernia surgery next week, and she can not think beyond that.  Patient will call in the near future when she is ready to pursue getting mammogram

## 2019-12-03 NOTE — Pre-Procedure Instructions (Signed)
Your procedure is scheduled on Thursday, June 17th.  Report to [facility] Main Entrance "A" at 10:00 A.M., and check in at the Admitting office.  Call this number if you have problems the morning of surgery:  [phone]  Call [phone] if you have any questions prior to your surgery date Monday-Friday 8am-4pm    Remember:  Do not eat after midnight the night before your surgery  You may drink clear liquids until 9:00 a.m. the morning of your surgery.   Clear liquids allowed are: Water, Non-Citrus Juices (without pulp), Carbonated Beverages, Clear Tea, Black Coffee Only, and Gatorade    Take these medicines the morning of surgery with A SIP OF WATER  amLODipine (NORVASC)  ezetimibe (ZETIA)  levETIRAcetam (KEPPRA)   WHAT DO I DO ABOUT MY DIABETES MEDICATION?   Marland Kitchen Do not take metFORMIN (GLUCOPHAGE)  the morning of surgery.  HOW TO MANAGE YOUR DIABETES BEFORE AND AFTER SURGERY  Why is it important to control my blood sugar before and after surgery? . Improving blood sugar levels before and after surgery helps healing and can limit problems. . A way of improving blood sugar control is eating a healthy diet by: o  Eating less sugar and carbohydrates o  Increasing activity/exercise o  Talking with your doctor about reaching your blood sugar goals . High blood sugars (greater than 180 mg/dL) can raise your risk of infections and slow your recovery, so you will need to focus on controlling your diabetes during the weeks before surgery. . Make sure that the doctor who takes care of your diabetes knows about your planned surgery including the date and location.  How do I manage my blood sugar before surgery? . Check your blood sugar at least 4 times a day, starting 2 days before surgery, to make sure that the level is not too high or low. . Check your blood sugar the morning of your surgery when you wake up and every 2 hours until you get to the Short Stay unit. o If your blood  sugar is less than 70 mg/dL, you will need to treat for low blood sugar: - Do not take insulin. - Treat a low blood sugar (less than 70 mg/dL) with  cup of clear juice (cranberry or apple), 4 glucose tablets, OR glucose gel. - Recheck blood sugar in 15 minutes after treatment (to make sure it is greater than 70 mg/dL). If your blood sugar is not greater than 70 mg/dL on recheck, call [phone] for further instructions. . Report your blood sugar to the short stay nurse when you get to Short Stay.  . If you are admitted to the hospital after surgery: o Your blood sugar will be checked by the staff and you will probably be given insulin after surgery (instead of oral diabetes medicines) to make sure you have good blood sugar levels. o The goal for blood sugar control after surgery is 80-180 mg/dL.   As of today, STOP taking any Aspirin (unless otherwise instructed by your surgeon) and Aspirin containing products, Aleve, Naproxen, Ibuprofen, Motrin, Advil, Goody's, BC's, all herbal medications, fish oil, and all vitamins.                      Do not wear jewelry, make up, or nail polish            Do not wear lotions, powders, perfumes, or deodorant.            Do not  shave 48 hours prior to surgery.             Do not bring valuables to the hospital.            [facility] is not responsible for any belongings or valuables.  Do NOT Smoke (Tobacco/Vaping) or drink Alcohol 24 hours prior to your procedure If you use a CPAP at night, you may bring all equipment for your overnight stay.   Contacts, glasses, dentures or bridgework may not be worn into surgery.      For patients admitted to the hospital, discharge time will be determined by your treatment team.   Patients discharged the day of surgery will not be allowed to drive home, and someone needs to stay with them for 24 hours.    Special instructions:   [facility]- Preparing For Surgery  Before surgery, you can play an important  role. Because skin is not sterile, your skin needs to be as free of germs as possible. You can reduce the number of germs on your skin by washing with CHG (chlorahexidine gluconate) Soap before surgery.  CHG is an antiseptic cleaner which kills germs and bonds with the skin to continue killing germs even after washing.    Oral Hygiene is also important to reduce your risk of infection.  Remember - BRUSH YOUR TEETH THE MORNING OF SURGERY WITH YOUR REGULAR TOOTHPASTE  Please do not use if you have an allergy to CHG or antibacterial soaps. If your skin becomes reddened/irritated stop using the CHG.  Do not shave (including legs and underarms) for at least 48 hours prior to first CHG shower. It is OK to shave your face.  Please follow these instructions carefully.   1. Shower the NIGHT BEFORE SURGERY and the MORNING OF SURGERY with CHG Soap.   2. If you chose to wash your hair, wash your hair first as usual with your normal shampoo.  3. After you shampoo, rinse your hair and body thoroughly to remove the shampoo.  4. Use CHG as you would any other liquid soap. You can apply CHG directly to the skin and wash gently with a scrungie or a clean washcloth.   5. Apply the CHG Soap to your body ONLY FROM THE NECK DOWN.  Do not use on open wounds or open sores. Avoid contact with your eyes, ears, mouth and genitals (private parts). Wash Face and genitals (private parts)  with your normal soap.   6. Wash thoroughly, paying special attention to the area where your surgery will be performed.  7. Thoroughly rinse your body with warm water from the neck down.  8. DO NOT shower/wash with your normal soap after using and rinsing off the CHG Soap.  9. Pat yourself dry with a CLEAN TOWEL.  10. Wear CLEAN PAJAMAS to bed the night before surgery, wear comfortable clothes the morning of surgery  11. Place CLEAN SHEETS on your bed the night of your first shower and DO NOT SLEEP WITH PETS.   Day of Surgery:    Do not apply any deodorants/lotions.  Please wear clean clothes to the hospital/surgery center.   Remember to brush your teeth WITH YOUR REGULAR TOOTHPASTE.   Please read over the following fact sheets that you were given.

## 2019-12-03 NOTE — Telephone Encounter (Signed)
Noted thank you

## 2019-12-04 ENCOUNTER — Other Ambulatory Visit (HOSPITAL_COMMUNITY): Payer: 59

## 2019-12-04 ENCOUNTER — Other Ambulatory Visit: Payer: Self-pay

## 2019-12-04 ENCOUNTER — Encounter (HOSPITAL_COMMUNITY)
Admission: RE | Admit: 2019-12-04 | Discharge: 2019-12-04 | Disposition: A | Discharge status: Home / Self Care | Payer: 59 | Source: Ambulatory Visit | Attending: Surgery | Admitting: Surgery

## 2019-12-04 ENCOUNTER — Encounter (HOSPITAL_COMMUNITY): Payer: Self-pay

## 2019-12-04 DIAGNOSIS — Z01812 Encounter for preprocedural laboratory examination: Secondary | ICD-10-CM | POA: Insufficient documentation

## 2019-12-04 LAB — BASIC METABOLIC PANEL
Anion gap: 10 (ref 5–15)
BUN: 15 mg/dL (ref 8–23)
CO2: 24 mmol/L (ref 22–32)
Calcium: 9 mg/dL (ref 8.9–10.3)
Chloride: 106 mmol/L (ref 98–111)
Creatinine, Ser: 0.73 mg/dL (ref 0.44–1.00)
GFR calc Af Amer: >60 mL/min (ref >60)
GFR calc non Af Amer: >60 mL/min (ref >60)
Glucose, Bld: 138 mg/dL — ABNORMAL HIGH (ref 70–99)
Potassium: 3.6 mmol/L (ref 3.5–5.1)
Sodium: 140 mmol/L (ref 135–145)

## 2019-12-04 LAB — CBC
HCT: 40.5 % (ref 36.0–46.0)
Hemoglobin: 12.7 g/dL (ref 12.0–15.0)
MCH: 26.3 pg (ref 26.0–34.0)
MCHC: 31.4 g/dL (ref 30.0–36.0)
MCV: 84 fL (ref 80.0–100.0)
Platelets: 321 10*3/uL (ref 150–400)
RBC: 4.82 MIL/uL (ref 3.87–5.11)
RDW: 13.4 % (ref 11.5–15.5)
WBC: 6.3 10*3/uL (ref 4.0–10.5)
nRBC: 0 % (ref 0.0–0.2)

## 2019-12-04 LAB — GLUCOSE, CAPILLARY: Glucose-Capillary: 161 mg/dL — ABNORMAL HIGH (ref 70–99)

## 2019-12-04 NOTE — Progress Notes (Signed)
PCP - Dr. Sherrie Mustache Cardiologist - denies  PPM/ICD - n/a Device Orders -n/a  Rep Notified - n/a  Chest x-ray - n/a EKG - 11/04/19  Stress Test - denies ECHO - 2011 Cardiac Cath -denies   Sleep Study - denies CPAP - denies  Fasting Blood Sugar - 120-130 Checks Blood Sugar 1 time a week  Blood Thinner Instructions:N/A Aspirin Instructions:N/A  ERAS Protcol -Protocol initiated. Pt educated on the protocol and asked to cease liquids by 0900 DOS.  PRE-SURGERY Ensure or G2- no drink ordered.   COVID TEST- Pt will go for covid test Monday, 6/14.    Anesthesia review: yes, hx of seizures.   Patient denies shortness of breath, fever, cough and chest pain at PAT appointment   All instructions explained to the patient, with a verbal understanding of the material. Patient agrees to go over the instructions while at home for a better understanding. Patient also instructed to self quarantine after being tested for COVID-19. The opportunity to ask questions was provided.    Coronavirus Screening  Have you experienced the following symptoms:  Cough yes/no: No Fever (>100.69F)  yes/no: No Runny nose yes/no: No Sore throat yes/no: No Difficulty breathing/shortness of breath  yes/no: No  Have you or a family member traveled in the last 14 days and where? yes/no: No   If the patient indicates "YES" to the above questions, their PAT will be rescheduled to limit the exposure to others and, the surgeon will be notified. THE PATIENT WILL NEED TO BE ASYMPTOMATIC FOR 14 DAYS.   If the patient is not experiencing any of these symptoms, the PAT nurse will instruct them to NOT bring anyone with them to their appointment since they may have these symptoms or traveled as well.   Please remind your patients and families that hospital visitation restrictions are in effect and the importance of the restrictions.

## 2019-12-07 ENCOUNTER — Other Ambulatory Visit (HOSPITAL_COMMUNITY)
Admission: RE | Admit: 2019-12-07 | Discharge: 2019-12-07 | Disposition: A | Discharge status: Home / Self Care | Payer: 59 | Source: Ambulatory Visit | Attending: Surgery | Admitting: Surgery

## 2019-12-07 DIAGNOSIS — Z20822 Contact with and (suspected) exposure to covid-19: Secondary | ICD-10-CM | POA: Insufficient documentation

## 2019-12-07 DIAGNOSIS — Z01812 Encounter for preprocedural laboratory examination: Secondary | ICD-10-CM | POA: Diagnosis not present

## 2019-12-07 LAB — SARS CORONAVIRUS 2 (TAT 6-24 HRS): SARS Coronavirus 2: NEGATIVE

## 2019-12-07 NOTE — Anesthesia Preprocedure Evaluation (Addendum)
Anesthesia Evaluation  Patient identified by MRN, date of birth, ID band Patient awake    Reviewed: Allergy & Precautions, NPO status , Patient's Chart, lab work & pertinent test results  History of Anesthesia Complications Negative for: history of anesthetic complications  Airway Mallampati: II  TM Distance: >3 FB Neck ROM: Full    Dental  (+) Dental Advisory Given   Pulmonary asthma ,    breath sounds clear to auscultation       Cardiovascular hypertension, Pt. on medications  Rhythm:Regular     Neuro/Psych Seizures -,  negative psych ROS   GI/Hepatic Neg liver ROS,  SUPRAUMBILICAL VENTRAL HERNIA   Endo/Other  diabetesMorbid obesity  Renal/GU Renal disease     Musculoskeletal  (+) Arthritis ,   Abdominal   Peds  Hematology  (+) anemia ,   Anesthesia Other Findings   Reproductive/Obstetrics                           Anesthesia Physical Anesthesia Plan  ASA: II  Anesthesia Plan: General   Post-op Pain Management:    Induction: Intravenous  PONV Risk Score and Plan: 3 and Ondansetron and Dexamethasone  Airway Management Planned: Oral ETT  Additional Equipment: None  Intra-op Plan:   Post-operative Plan: Extubation in OR  Informed Consent: I have reviewed the patients History and Physical, chart, labs and discussed the procedure including the risks, benefits and alternatives for the proposed anesthesia with the patient or authorized representative who has indicated his/her understanding and acceptance.     Dental advisory given  Plan Discussed with: CRNA and Surgeon  Anesthesia Plan Comments: (PAT note by Karoline Caldwell, PA-C: History of seizures, well controlled on Keppra, no recent seizures per last primary care note 11/04/2019.  DM2 well-controlled last A1c 6.4 on 11/04/2019.  Blood pressure recently not as well controlled.  Per patient, home readings are reportedly  better.  BP Readings from Last 3 Encounters: 12/04/19 (!) 151/99 11/16/19 138/86 11/04/19 (!) 150/100   Preop labs reviewed, WNL.  EKG 11/04/2019: Sinus rhythm.  Rate 64.  TTE 05/25/2010: - Left ventricle: The cavity size was normal. Wall thickness was  normal. Systolic function was normal. The estimated ejection  fraction was in the range of 60% to 65%. Wall motion was normal;  there were no regional wall motion abnormalities. Left ventricular  diastolic function parameters were normal.  - Pericardium, extracardiac: A trivial pericardial effusion was  identified. )       Anesthesia Quick Evaluation

## 2019-12-07 NOTE — Progress Notes (Signed)
Anesthesia Chart Review:  History of seizures, well controlled on Keppra, no recent seizures per last primary care note 11/04/2019.  DM2 well-controlled last A1c 6.4 on 11/04/2019.  Blood pressure recently not as well controlled.  Per patient, home readings are reportedly better.  BP Readings from Last 3 Encounters:  12/04/19 (!) 151/99  11/16/19 138/86  11/04/19 (!) 150/100    Preop labs reviewed, WNL.  EKG 11/04/2019: Sinus rhythm.  Rate 64.  TTE 05/25/2010: - Left ventricle: The cavity size was normal. Wall thickness was  normal. Systolic function was normal. The estimated ejection  fraction was in the range of 60% to 65%. Wall motion was normal;  there were no regional wall motion abnormalities. Left ventricular  diastolic function parameters were normal.  - Pericardium, extracardiac: A trivial pericardial effusion was  identified.    Sarah Brock [facility] Short Stay Center/Anesthesiology Phone [phone] 12/07/2019 4:14 PM    .

## 2019-12-10 ENCOUNTER — Encounter (HOSPITAL_COMMUNITY): Payer: Self-pay | Admitting: Surgery

## 2019-12-10 ENCOUNTER — Ambulatory Visit (HOSPITAL_COMMUNITY)
Admission: RE | Admit: 2019-12-10 | Discharge: 2019-12-10 | Disposition: A | Discharge status: Home / Self Care | Payer: 59 | Attending: Surgery | Admitting: Surgery

## 2019-12-10 ENCOUNTER — Other Ambulatory Visit: Payer: Self-pay

## 2019-12-10 ENCOUNTER — Ambulatory Visit (HOSPITAL_COMMUNITY): Payer: 59 | Admitting: Certified Registered Nurse Anesthetist

## 2019-12-10 ENCOUNTER — Ambulatory Visit (HOSPITAL_COMMUNITY): Payer: 59 | Admitting: Physician Assistant

## 2019-12-10 ENCOUNTER — Encounter (HOSPITAL_COMMUNITY)
Admission: RE | Disposition: A | Discharge status: Home / Self Care | Payer: Self-pay | Source: Home / Self Care | Attending: Surgery

## 2019-12-10 ENCOUNTER — Ambulatory Visit: Admit: 2019-12-10 | Discharge: 2019-12-10 | Payer: BLUE CROSS/BLUE SHIELD

## 2019-12-10 DIAGNOSIS — Z87898 Personal history of other specified conditions: Secondary | ICD-10-CM

## 2019-12-10 DIAGNOSIS — Z7901 Long term (current) use of anticoagulants: Secondary | ICD-10-CM

## 2019-12-10 DIAGNOSIS — Z833 Family history of diabetes mellitus: Secondary | ICD-10-CM | POA: Diagnosis not present

## 2019-12-10 DIAGNOSIS — E119 Type 2 diabetes mellitus without complications: Secondary | ICD-10-CM | POA: Insufficient documentation

## 2019-12-10 DIAGNOSIS — D649 Anemia, unspecified: Secondary | ICD-10-CM | POA: Insufficient documentation

## 2019-12-10 DIAGNOSIS — Z91013 Allergy to seafood: Secondary | ICD-10-CM | POA: Diagnosis not present

## 2019-12-10 DIAGNOSIS — J45909 Unspecified asthma, uncomplicated: Secondary | ICD-10-CM | POA: Insufficient documentation

## 2019-12-10 DIAGNOSIS — K439 Ventral hernia without obstruction or gangrene: Secondary | ICD-10-CM | POA: Insufficient documentation

## 2019-12-10 DIAGNOSIS — Z7984 Long term (current) use of oral hypoglycemic drugs: Secondary | ICD-10-CM | POA: Diagnosis not present

## 2019-12-10 DIAGNOSIS — E7849 Other hyperlipidemia: Secondary | ICD-10-CM | POA: Diagnosis not present

## 2019-12-10 DIAGNOSIS — Z90710 Acquired absence of both cervix and uterus: Secondary | ICD-10-CM | POA: Insufficient documentation

## 2019-12-10 DIAGNOSIS — Z79899 Other long term (current) drug therapy: Secondary | ICD-10-CM | POA: Insufficient documentation

## 2019-12-10 DIAGNOSIS — M1990 Unspecified osteoarthritis, unspecified site: Secondary | ICD-10-CM | POA: Insufficient documentation

## 2019-12-10 DIAGNOSIS — Z6831 Body mass index (BMI) 31.0-31.9, adult: Secondary | ICD-10-CM | POA: Insufficient documentation

## 2019-12-10 DIAGNOSIS — Z888 Allergy status to other drugs, medicaments and biological substances status: Secondary | ICD-10-CM | POA: Diagnosis not present

## 2019-12-10 DIAGNOSIS — I10 Essential (primary) hypertension: Secondary | ICD-10-CM | POA: Insufficient documentation

## 2019-12-10 DIAGNOSIS — R569 Unspecified convulsions: Secondary | ICD-10-CM | POA: Diagnosis not present

## 2019-12-10 HISTORY — PX: SUPRA-UMBILICAL HERNIA: SHX6105

## 2019-12-10 LAB — GLUCOSE, CAPILLARY
Glucose-Capillary: 163 mg/dL — ABNORMAL HIGH (ref 70–99)
Glucose-Capillary: 167 mg/dL — ABNORMAL HIGH (ref 70–99)

## 2019-12-10 SURGERY — SUPRA-UMBILICAL HERNIA
Anesthesia: General | Site: Abdomen

## 2019-12-10 MED ORDER — DEXAMETHASONE SODIUM PHOSPHATE 10 MG/ML IJ SOLN
INTRAMUSCULAR | Status: AC
  Filled 2019-12-10: qty 1

## 2019-12-10 MED ORDER — GABAPENTIN 300 MG PO CAPS
300.0000 mg | ORAL_CAPSULE | ORAL | Status: AC
  Administered 2019-12-10: 300 mg via ORAL
  Filled 2019-12-10: qty 1

## 2019-12-10 MED ORDER — SUGAMMADEX SODIUM 200 MG/2ML IV SOLN
INTRAVENOUS | Status: DC | PRN
  Administered 2019-12-10: 200 mg via INTRAVENOUS

## 2019-12-10 MED ORDER — LIDOCAINE 2% (20 MG/ML) 5 ML SYRINGE
INTRAMUSCULAR | Status: DC | PRN
  Administered 2019-12-10: 60 mg via INTRAVENOUS

## 2019-12-10 MED ORDER — LACTATED RINGERS IV SOLN: INTRAVENOUS | Status: DC | PRN

## 2019-12-10 MED ORDER — ROCURONIUM BROMIDE 10 MG/ML (PF) SYRINGE
PREFILLED_SYRINGE | INTRAVENOUS | Status: DC | PRN
  Administered 2019-12-10: 60 mg via INTRAVENOUS

## 2019-12-10 MED ORDER — BUPIVACAINE HCL (PF) 0.25 % IJ SOLN
INTRAMUSCULAR | Status: AC
  Filled 2019-12-10: qty 30

## 2019-12-10 MED ORDER — ACETAMINOPHEN 10 MG/ML IV SOLN: 1000.0000 mg | Freq: Once | INTRAVENOUS | Status: DC | PRN

## 2019-12-10 MED ORDER — BUPIVACAINE HCL 0.25 % IJ SOLN
INTRAMUSCULAR | Status: DC | PRN
  Administered 2019-12-10: 30 mL

## 2019-12-10 MED ORDER — FENTANYL CITRATE (PF) 250 MCG/5ML IJ SOLN
INTRAMUSCULAR | Status: AC
  Filled 2019-12-10: qty 5

## 2019-12-10 MED ORDER — OXYCODONE HCL 5 MG PO TABS
5.0000 mg | ORAL_TABLET | Freq: Four times a day (QID) | ORAL | 0 refills | Status: DC | PRN
Start: 2019-12-10 — End: 2020-05-09

## 2019-12-10 MED ORDER — ACETAMINOPHEN 500 MG PO TABS
1000.0000 mg | ORAL_TABLET | ORAL | Status: AC
  Administered 2019-12-10: 1000 mg via ORAL
  Filled 2019-12-10: qty 2

## 2019-12-10 MED ORDER — CHLORHEXIDINE GLUCONATE CLOTH 2 % EX PADS: 6.0000 | MEDICATED_PAD | Freq: Once | CUTANEOUS | Status: DC

## 2019-12-10 MED ORDER — OXYCODONE HCL 5 MG PO TABS: 5.0000 mg | ORAL_TABLET | Freq: Once | ORAL | Status: DC | PRN

## 2019-12-10 MED ORDER — FENTANYL CITRATE (PF) 250 MCG/5ML IJ SOLN
INTRAMUSCULAR | Status: DC | PRN
  Administered 2019-12-10: 50 ug via INTRAVENOUS
  Administered 2019-12-10: 150 ug via INTRAVENOUS

## 2019-12-10 MED ORDER — KETOROLAC TROMETHAMINE 30 MG/ML IJ SOLN
INTRAMUSCULAR | Status: DC | PRN
Start: 2019-12-10 — End: 2019-12-10
  Administered 2019-12-10: 30 mg via INTRAVENOUS

## 2019-12-10 MED ORDER — CEFAZOLIN SODIUM-DEXTROSE 2-4 GM/100ML-% IV SOLN
2.0000 g | INTRAVENOUS | Status: AC
  Administered 2019-12-10: 2 g via INTRAVENOUS
  Filled 2019-12-10: qty 100

## 2019-12-10 MED ORDER — ACETAMINOPHEN 160 MG/5ML PO SOLN: 1000.0000 mg | Freq: Once | ORAL | Status: DC | PRN

## 2019-12-10 MED ORDER — ONDANSETRON HCL 4 MG/2ML IJ SOLN
INTRAMUSCULAR | Status: DC | PRN
  Administered 2019-12-10: 4 mg via INTRAVENOUS

## 2019-12-10 MED ORDER — ORAL CARE MOUTH RINSE: 15.0000 mL | Freq: Once | OROMUCOSAL | Status: AC

## 2019-12-10 MED ORDER — PROPOFOL 10 MG/ML IV BOLUS
INTRAVENOUS | Status: DC | PRN
  Administered 2019-12-10: 140 mg via INTRAVENOUS

## 2019-12-10 MED ORDER — CHLORHEXIDINE GLUCONATE 0.12 % MT SOLN
15.0000 mL | Freq: Once | OROMUCOSAL | Status: AC
  Administered 2019-12-10: 15 mL via OROMUCOSAL
  Filled 2019-12-10: qty 15

## 2019-12-10 MED ORDER — ROCURONIUM BROMIDE 10 MG/ML (PF) SYRINGE
PREFILLED_SYRINGE | INTRAVENOUS | Status: AC
  Filled 2019-12-10: qty 10

## 2019-12-10 MED ORDER — MIDAZOLAM HCL 2 MG/2ML IJ SOLN
INTRAMUSCULAR | Status: DC | PRN
  Administered 2019-12-10: 2 mg via INTRAVENOUS

## 2019-12-10 MED ORDER — DEXAMETHASONE SODIUM PHOSPHATE 10 MG/ML IJ SOLN
INTRAMUSCULAR | Status: DC | PRN
  Administered 2019-12-10: 5 mg via INTRAVENOUS

## 2019-12-10 MED ORDER — STERILE WATER FOR IRRIGATION IR SOLN
Status: DC | PRN
  Administered 2019-12-10: 1000 mL

## 2019-12-10 MED ORDER — OXYCODONE HCL 5 MG/5ML PO SOLN: 5.0000 mg | Freq: Once | ORAL | Status: DC | PRN

## 2019-12-10 MED ORDER — 0.9 % SODIUM CHLORIDE (POUR BTL) OPTIME
TOPICAL | Status: DC | PRN
  Administered 2019-12-10: 1000 mL

## 2019-12-10 MED ORDER — FENTANYL CITRATE (PF) 100 MCG/2ML IJ SOLN: 25.0000 ug | INTRAMUSCULAR | Status: DC | PRN

## 2019-12-10 MED ORDER — ONDANSETRON HCL 4 MG/2ML IJ SOLN
INTRAMUSCULAR | Status: AC
  Filled 2019-12-10: qty 2

## 2019-12-10 MED ORDER — ACETAMINOPHEN 500 MG PO TABS: 1000.0000 mg | ORAL_TABLET | Freq: Once | ORAL | Status: DC | PRN

## 2019-12-10 MED ORDER — LIDOCAINE 2% (20 MG/ML) 5 ML SYRINGE
INTRAMUSCULAR | Status: AC
  Filled 2019-12-10: qty 5

## 2019-12-10 MED ORDER — MIDAZOLAM HCL 2 MG/2ML IJ SOLN
INTRAMUSCULAR | Status: AC
  Filled 2019-12-10: qty 2

## 2019-12-10 MED FILL — oxyCODONE HCL 5 MG TABS: 5 | 5 days supply | Qty: 20 | Fill #0

## 2019-12-10 SURGICAL SUPPLY — 40 items
BENZOIN TINCTURE PRP APPL 2/3 (GAUZE/BANDAGES/DRESSINGS) ×2 IMPLANT
BINDER ABDOMINAL 12 ML 46-62 (SOFTGOODS) ×2 IMPLANT
BLADE CLIPPER SURG (BLADE) IMPLANT
CANISTER SUCT 3000ML PPV (MISCELLANEOUS) IMPLANT
CHLORAPREP W/TINT 26 (MISCELLANEOUS) ×2 IMPLANT
CLSR STERI-STRIP ANTIMIC 1/2X4 (GAUZE/BANDAGES/DRESSINGS) ×2 IMPLANT
COVER SURGICAL LIGHT HANDLE (MISCELLANEOUS) ×2 IMPLANT
COVER WAND RF STERILE (DRAPES) IMPLANT
DRAPE LAPAROTOMY 100X72 PEDS (DRAPES) ×2 IMPLANT
DRSG TEGADERM 4X4.75 (GAUZE/BANDAGES/DRESSINGS) ×2 IMPLANT
ELECT CAUTERY BLADE 6.4 (BLADE) IMPLANT
ELECT REM PT RETURN 9FT ADLT (ELECTROSURGICAL) ×2
ELECTRODE REM PT RTRN 9FT ADLT (ELECTROSURGICAL) ×1 IMPLANT
GAUZE 4X4 16PLY RFD (DISPOSABLE) ×2 IMPLANT
GAUZE SPONGE 2X2 8PLY STRL LF (GAUZE/BANDAGES/DRESSINGS) ×1 IMPLANT
GAUZE SPONGE 4X4 12PLY STRL (GAUZE/BANDAGES/DRESSINGS) ×2 IMPLANT
GLOVE BIO SURGEON STRL SZ7 (GLOVE) ×2 IMPLANT
GLOVE BIOGEL PI IND STRL 7.5 (GLOVE) ×1 IMPLANT
GLOVE BIOGEL PI INDICATOR 7.5 (GLOVE) ×1
GOWN STRL REUS W/ TWL LRG LVL3 (GOWN DISPOSABLE) ×2 IMPLANT
GOWN STRL REUS W/TWL LRG LVL3 (GOWN DISPOSABLE) ×2
KIT BASIN OR (CUSTOM PROCEDURE TRAY) ×2 IMPLANT
KIT TURNOVER KIT B (KITS) ×2 IMPLANT
MESH VENTRALIGHT ST 4.5IN (Mesh General) ×2 IMPLANT
NEEDLE HYPO 25GX1X1/2 BEV (NEEDLE) ×2 IMPLANT
NS IRRIG 1000ML POUR BTL (IV SOLUTION) ×2 IMPLANT
PACK GENERAL/GYN (CUSTOM PROCEDURE TRAY) ×2 IMPLANT
PAD ARMBOARD 7.5X6 YLW CONV (MISCELLANEOUS) ×2 IMPLANT
PENCIL SMOKE EVACUATOR (MISCELLANEOUS) ×2 IMPLANT
SPONGE GAUZE 2X2 8PLY STRL LF (GAUZE/BANDAGES/DRESSINGS) ×2 IMPLANT
SPONGE GAUZE 2X2 STER 10/PKG (GAUZE/BANDAGES/DRESSINGS) ×1
STRIP CLOSURE SKIN 1/2X4 (GAUZE/BANDAGES/DRESSINGS) ×2 IMPLANT
SUT MNCRL AB 4-0 PS2 18 (SUTURE) ×2 IMPLANT
SUT NOVA NAB DX-16 0-1 5-0 T12 (SUTURE) ×2 IMPLANT
SUT NOVA NAB GS-21 0 18 T12 DT (SUTURE) ×4 IMPLANT
SUT VIC AB 3-0 SH 27 (SUTURE) ×1
SUT VIC AB 3-0 SH 27X BRD (SUTURE) ×1 IMPLANT
SYR CONTROL 10ML LL (SYRINGE) ×4 IMPLANT
TOWEL GREEN STERILE (TOWEL DISPOSABLE) ×2 IMPLANT
TOWEL GREEN STERILE FF (TOWEL DISPOSABLE) ×2 IMPLANT

## 2019-12-10 NOTE — Op Note (Signed)
Ventral Hernia Repair Procedure Note  Indications: The patient is a 64 year old female who presents with an abdominal wall hernia. This patient is status post emergent exploratory laparotomy, open appendectomy, small bowel resection in July 2019. She had a pelvic drain placed and her wound was left open. Eventually the drain was removed and her wound healed. Nevertheless couple of months the patient has noticed some swelling above her midline incision. Occasionally this causes some discomfort. She denies any significant change in her bowel habits. She actually does have occasional postprandial diarrhea. She denies any obstructive symptoms. She has not had any imaging of this area.  Pre-operative Diagnosis: Epigastric ventral hernia  Post-operative Diagnosis: same  Surgeon: Maia Petties   Assistants: none  Anesthesia: General endotracheal anesthesia  ASA Class: 2  Procedure Details  The patient was seen in the Holding Room. The risks, benefits, complications, treatment options, and expected outcomes were discussed with the patient. The possibilities of reaction to medication, pulmonary aspiration, perforation of viscus, bleeding, recurrent infection, the need for additional procedures, failure to diagnose a condition, and creating a complication requiring transfusion or operation were discussed with the patient. The patient concurred with the proposed plan, giving informed consent.  The site of surgery properly noted/marked. The patient was taken to the operating room, identified as Sarah Brock and the procedure verified as ventral hernia repair. A Time Out was held and the above information confirmed.  The patient was placed supine.  After establishing general anesthesia, the abdomen was prepped with Chloraprep and draped in sterile fashion.  We made a vertical incision over the palpable hernia above the umbilicus. Dissection was carried down to the hernia sac located above the  fascia and mobilized from surrounding structures.  Intact fascia was identified circumferentially around the defect.  I excised the hernia sac and mobilized the small bowel that was adherent to the anterior abdominal wall.  The defect measured 4.5 cm in diameterWe used a 9 cm round Ventralight mesh and secured this to the fascia with eight interrupted 0 Novofil sutures.  The fascial defect was reapproximated with interrupted figure-of-8 1 Novofil sutures.  The subcutaneous tissues were irrigated.  Local anesthetic was infiltrated into the fascia.  Hemostasis was confirmed.  The skin incision was closed in layers with a 3-0 Vicryl subcutaneous and  4-0 Vicryl subcuticular closure.  Steri-Strips were applied at the end of the operation.    Instrument, sponge, and needle counts were correct prior to closure and at the conclusion of the case.   Findings: 4.5 cm fascial defect  Estimated Blood Loss:  less than 50 mL         Drains: none                      Complications:  None; patient tolerated the procedure well.         Disposition: PACU - hemodynamically stable.         Condition: stable  Imogene Burn. Georgette Dover, MD, [facility] Surgery  General/ Trauma Surgery   12/10/2019 1:34 PM

## 2019-12-10 NOTE — Transfer of Care (Signed)
Immediate Anesthesia Transfer of Care Note  Patient: ATHEA HALEY  Procedure(s) Performed: OPEN REPAIR OF SUPRA-UMBILICAL VENTRAL HERNIA WITH MESH (N/A Abdomen)  Patient Location: PACU  Anesthesia Type:General  Level of Consciousness: awake, alert  and oriented  Airway & Oxygen Therapy: Patient Spontanous Breathing and Patient connected to face mask oxygen  Post-op Assessment: Report given to RN and Post -op Vital signs reviewed and stable  Post vital signs: Reviewed and stable  Last Vitals:  Vitals Value Taken Time  BP 130/80   Temp    Pulse 91   Resp 16   SpO2 100     Last Pain:  Vitals:   12/10/19 1050  PainSc: 0-No pain      Patients Stated Pain Goal: 3 (85/50/15 8682)  Complications: No complications documented.

## 2019-12-10 NOTE — H&P (Signed)
History of Present Illness  The patient is a 64 year old female who presents with an abdominal wall hernia. This patient is status post emergent exploratory laparotomy, open appendectomy, small bowel resection in July 2019. She had a pelvic drain placed and her wound was left open. Eventually the drain was removed and her wound healed. Nevertheless couple of months the patient has noticed some swelling above her midline incision. Occasionally this causes some discomfort. She denies any significant change in her bowel habits. She actually does have occasional postprandial diarrhea. She denies any obstructive symptoms. She has not had any imaging of this area. She presents now for possible hernia repair.   Problem List/Past Medical ( N&V (NAUSEA AND VOMITING) (R11.2) PERFORATED APPENDICITIS (K35.32) VENTRAL HERNIA WITHOUT OBSTRUCTION OR GANGRENE (K43.9) GALL BLADDER STONES (K80.20) [02/23/2016]:  Past Surgical History  Hysterectomy (not due to cancer) - Partial Oral Surgery  Diagnostic Studies History  Colonoscopy never Pap Smear >5 years ago  Allergies  Simvastatin *ANTIHYPERLIPIDEMICS* Abdominal pain. SHELLFISH Allergies Reconciled  Medication History  Tylenol (500MG  Capsule, Oral as needed) Active. Zetia (10MG  Tablet, Oral) Active. ZyrTEC Allergy (10MG  Tablet, Oral) Active. LevETIRAcetam (500MG  Tablet, Oral) Active. Lisinopril (30MG  Tablet, Oral) Active. MetFORMIN HCl (500MG  Tablet, Oral) Active. Ferrous Sulfate (325 (65 Fe)MG Tablet, Oral) Active. Medications Reconciled  Social History  Alcohol use Occasional alcohol use. Caffeine use Coffee, Tea. No drug use Tobacco use Never smoker.  Family History  Diabetes Mellitus Son.  Pregnancy / Birth History  Age at menarche 14 years. Gravida 4 Maternal age 65-20 Para 3  Other Problems  Back Pain Cholelithiasis Diabetes Mellitus High blood pressure Seizure  Disorder Transfusion history     Review of Systems  Note: Positive for abdominal pain, abdominal swelling, diarrhea  10 system ROS otherwise negative   Vitals  Weight: 172.2 lb Height: 52in Body Surface Area: 1.58 m Body Mass Index: 44.77 kg/m  Temp.: 97.19F(Tympanic)  Pulse: 110 (Regular)  BP: 112/72 (Sitting, Left Arm, Standard)        Physical Exam  The physical exam findings are as follows: Note:Constitutional: WDWN in NAD, conversant, no obvious deformities Eyes: Pupils equal, round; sclera anicteric; moist conjunctiva; no lid lag HENT: Oral mucosa moist; good dentition Neck: No masses palpated, trachea midline; no thyromegaly Lungs: CTA bilaterally; normal respiratory effort CV: Regular rate and rhythm; no murmurs; extremities well-perfused with no edema Abd: +bowel sounds, soft, non-tender, no palpable organomegaly; healed midline incision; obvious ventral hernia above the midline incision; begins at upper edge of umbilicus and extends up into the epigastrium. Palpable fascial defect about 3 cm. Musc: Normal gait; no apparent clubbing or cyanosis in extremities Lymphatic: No palpable cervical or axillary lymphadenopathy Skin: Warm, dry; no sign of jaundice Psychiatric - alert and oriented x 4; calm mood and affect    Assessment & Plan   VENTRAL HERNIA WITHOUT OBSTRUCTION OR GANGRENE (K43.9)  Current Plans Schedule for Surgery - Open repair of supraumbilical ventral hernia. The surgical procedure has been discussed with the patient. Potential risks, benefits, alternative treatments, and expected outcomes have been explained. All of the patient's questions at this time have been answered. The likelihood of reaching the patient's treatment goal is good. The patient understand the proposed surgical procedure and wishes to proceed.  Imogene Burn. Georgette Dover, MD, [facility]  General/ Trauma  Surgery   12/10/2019 11:22 AM

## 2019-12-10 NOTE — Anesthesia Procedure Notes (Signed)
Procedure Name: Intubation Date/Time: 12/10/2019 12:29 PM Performed by: Alain Marion, CRNA Pre-anesthesia Checklist: Patient identified, Emergency Drugs available, Suction available and Patient being monitored Patient Re-evaluated:Patient Re-evaluated prior to induction Oxygen Delivery Method: Circle System Utilized Preoxygenation: Pre-oxygenation with 100% oxygen Induction Type: IV induction Ventilation: Mask ventilation without difficulty Laryngoscope Size: Miller and 2 Grade View: Grade I Tube type: Oral Tube size: 7.0 mm Number of attempts: 1 Airway Equipment and Method: Stylet Placement Confirmation: ETT inserted through vocal cords under direct vision,  positive ETCO2 and breath sounds checked- equal and bilateral Secured at: 21 cm Tube secured with: Tape Dental Injury: Teeth and Oropharynx as per pre-operative assessment

## 2019-12-10 NOTE — Discharge Instructions (Signed)
[facility] _______[facility]  VENTRAL HERNIA REPAIR: POST OP INSTRUCTIONS  Always review your discharge instruction sheet given to you by the facility where your surgery was performed. IF YOU HAVE DISABILITY OR FAMILY LEAVE FORMS, YOU MUST BRING THEM TO THE OFFICE FOR PROCESSING.   DO NOT GIVE THEM TO YOUR DOCTOR.  1. A  prescription for pain medication may be given to you upon discharge.  Take your pain medication as prescribed, if needed.  If narcotic pain medicine is not needed, then you may take acetaminophen (Tylenol) or ibuprofen (Advil) as needed. 2. Take your usually prescribed medications unless otherwise directed. If you need a refill on your pain medication, please contact your pharmacy.  They will contact our office to request authorization. Prescriptions will not be filled after 5 pm or on week-ends. 3. You should follow a light diet the first 24 hours after arrival home, such as soup and crackers, etc.  Be sure to include lots of fluids daily.  Resume your normal diet the day after surgery. 4.Most patients will experience some swelling and bruising around the incision.  Ice packs and reclining will help.  Swelling and bruising can take several days to resolve.  6. It is common to experience some constipation if taking pain medication after surgery.  Increasing fluid intake and taking a stool softener (such as Colace) will usually help or prevent this problem from occurring.  A mild laxative (Milk of Magnesia or Miralax) should be taken according to package directions if there are no bowel movements after 48 hours. 7. Unless discharge instructions indicate otherwise, you may remove your bandages 24-48 hours after surgery, and you may shower at that time.  You may have steri-strips (small skin tapes) in place directly over the incision.  These strips should be left on the skin for 7-10 days.  8. ACTIVITIES:  You may resume regular (light) daily activities beginning the next  day--such as daily self-care, walking, climbing stairs--gradually increasing activities as tolerated.  You may have sexual intercourse when it is comfortable.  Refrain from any heavy lifting or straining until approved by your doctor.  Wear your abdominal binder whenever you are out of bed.  a.You may drive when you are no longer taking prescription pain medication, you can comfortably wear a seatbelt, and you can safely maneuver your car and apply brakes. b.RETURN TO WORK:   _____________________________________________  9.You should see your doctor in the office for a follow-up appointment approximately 2-3 weeks after your surgery.  Make sure that you call for this appointment within a day or two after you arrive home to insure a convenient appointment time. 10.OTHER INSTRUCTIONS: _________________________    _____________________________________  WHEN TO CALL YOUR DOCTOR: 1. Fever over 101.0 2. Inability to urinate 3. Nausea and/or vomiting 4. Extreme swelling or bruising 5. Continued bleeding from incision. 6. Increased pain, redness, or drainage from the incision  The clinic staff is available to answer your questions during regular business hours.  Please don't hesitate to call and ask to speak to one of the nurses for clinical concerns.  If you have a medical emergency, go to the nearest emergency room or call 911.  A surgeon from [facility] is always on call at the hospital   [address] [place], [place], [place]  [address] ?  [address], [place], [place]   [address] [phone] ? [phone] ? FAX [phone] Web site: [email or web address]

## 2019-12-10 NOTE — Patient Instructions (Signed)
We want your upcoming surgical visit to be as safe and comfortable as possible. The following instructions are designed to prepare you for your surgical hospitalization.  Please read and follow all instructions carefully.    Procedure Location  Your procedure is scheduled to take place at [facility] -  OR Arrivals: [address] 1st Floor. Report to Room M104J at assigned ARRIVAL time noted below. Phone [phone]    Procedure Date and Time  Please arrive on 12/21/2019 .       Contact your surgeon's office if you have not received your time of arrival for the day surgery    If for any reason your surgery start time is changed, you will receive a call from your surgeons office.  Should you have any questions regarding the date or time of arrival, please call your surgeons office and ask to speak with his/her practice assistant.    Preparing for Surgery  What can I eat or drink on the day of surgery?   Please do not have anything to eat or drink except clear liquids after midnight the evening before your surgery (including gum, candy or mints).   You may have clear liquids on the day of surgery up to 2 hours prior to arrival.   Clear liquids include:   Non-pulp, clear apple juice.   Tea with sugar or sweetener (NO milk, cream, or milk substitute)   Gatorade   Water   If you drink anything other than clear liquids on the day of surgery, or if you have ANYTHING to drink in the two hours prior to hospital arrival, then your doctors will cancel your surgery for your safety.   If you have been instructed to take any medications the day of surgery, take them with a small sip of water.     If your surgeon has given you additional instructions about what to eat or drink before the day of surgery, please follow those instructions as well.    Which medications should I take on the day of surgery?   Debbie Harper   Prior to Surgery Medication Instructions NTI:[id number]    Printed on:12/10/19 1549    Medication Information Take Morning of Surgery Special Instructions          apixaban (ELIQUIS) 5 mg tablet  (apixaban)  Take 1 tablet (5 mg total) by mouth 2 (two) times daily   take     metoprolol tartrate (LOPRESSOR) 25 mg tablet  (metoprolol tartrate)  Take 12.5 mg by mouth Twice a day      take         Preparing to Come to the Hospital   Showering Instructions:   Shower the morning of surgery. After showering, do not apply lotion, cream, powder, deodorant, or hair conditioner.    Do not shave or remove body hair. Shaving your face is generally fine. If you are having head surgery, however, ask your doctor whether you can shave.         Wear casual, loose fitting, comfortable clothing and leave all valuables, including jewelry, and large sums of cash at home.   Leave your valuables at home. Belongings that remain with you are your responsibility. [facility] is not liable for loss or damage. If valuables are brought to the hospital, they will be identified and recorded by our admitting team. All jewelry must be removed and left at home. If rings cannot be removed, we encourage you to have them removed by  a jeweler prior to surgery to avoid damage or loss of ring.  [facility] is not responsible of lose or damaged jewelry. This policy protects the patient and prevents the items from being lost or damaged.    Leave contact lenses at home. Wear your eyeglasses and bring a case.   If you develop any illness prior to surgery (fever, cough, sore throat, cold, flu, infection), OR START A NEW MEDICATION, please call your surgeon and the [facility] at [phone].   If you are spending the night you may bring toiletries and sleeping clothes if you desire; otherwise the hospital will provide them for you.    DO NOT bring your medications with you to the hospital unless you were specifically instructed to do so.   DO bring a list of your medications including dose(s) and when you take them.   DO bring  TWO forms of ID - including one ID with a photo.    Leaving the Hospital   Please ask your surgeon about your anticipated length of stay.   Please make sure your ride is available to pick you up by 12N on day of discharge   It is recommended that all patients have a responsible person at home the first night after discharge from the hospital.   ALL patients, including same day surgery patients, must arrange for an adult to drive/escort them home upon discharge.  Patients going home the same day of surgery will have their procedure cancelled if these arrangements are not made ahead of time.    Family and Friends  Friends and family may wait in the assigned waiting areas.  Patient care coordinators are available in these patient waiting areas to provide updates regarding patient progress; hospital room assignments; and discharge planning (for same day surgery).    [facility] OR, [address] Room #M104J    Guided Imagery  Research has shown that listening to guided imagery is helpful for many health conditions.  [facility] offers these sessions to listen to at the following website: [email or web address].http://huff.org/     Please consider guided imagery to prepare for surgery and for coping with stress, sleep and pain.      COVID Screening On-Demand  Screening for COVID is required for all patients and visitors arriving to a [facility] facility. To expedite the screening process, you are welcome to utilize the [facility] screening on-demand tool. Please follow the link from your smartphone or scan the QR code to open the screening tool on the day of your arrival and answer the screening questions prior to your arrival.     On-demand COVID screening: [email or web address]      Temporary Visitor Restrictions During the COVID-19 Emergency    Wed like to provide advance notice of additional protections our hospital has temporarily put in place for the safety of our patients and visitors, including  you, your loved ones, and our healthcare providers.     Visitors are restricted from the hospital and our surgical waiting area will be closed to non-patients.   If you are not going home the day of surgery, we ask that your family members/visitors stay at home.   If you are going home the day of your surgery, your family member or person who will drive you home will be unable to accompany you to the preop / pacu area.  We strongly encourage your family member/driver to practice social distancing in the location they chose to wait.  If the driver  lives in [place], we encourage them to return home to wait.       Patients will need to undergo a health screening on arrival and will not be allowed if they have symptoms of illness, including cough, runny nose, sneezing, fever, and sore throat.   Additionally, children under the age of 79 are restricted from the hospital.    Thank you for your cooperation.    [facility], Radiation Oncology Procedure Room Visitor Policy:     Patients are allowed one guest to accompany them to Radiation Oncology but must wait in our main waiting room, [facility] lobby, or [place] (or leave and return for patient pick-up)   Guests are not allowed to be in the procedure suites or patient holding bays   Our staff will be contacting the guest/caregiver regarding patients' status and pick-up information     FAQ     1.     What if I or the person who is scheduled to take me home following my procedure has one of the symptoms you stated?   Please attempt to secure a ride home with a different friend or family member. If thats not possible, please contact your surgeons office immediately to inquire whether or not it is appropriate to reschedule the procedure.     2.     Why are children not allowed?   This is a precaution put in place by our clinical leadership and intended to ensure the safety of our patients. Younger children can carry viruses  even when they are not symptomatic.

## 2019-12-10 NOTE — Anesthesia Pre-Procedure Evaluation (Addendum)
[facility]  Anesthesia Preprocedure Evaluation    Procedure Information     Case: [id number] Date/Time: 12/21/19 0800    Procedure: ATRIAL FIBRILLATION, CATHETER PVI (N/A )    Diagnosis: Paroxysmal atrial fibrillation (CMS code) [I48.0]    Pre-op diagnosis: Paroxysmal atrial fibrillation (CMS code) [I48.0]    Location: [facility] CATH EP LAB 03 / [facility]    Surgeons: Virgel Bouquet, MD          Precautions          None          Relevant Problems   No relevant active problems         Anesthesia Encounter History        CC/HPI/Past Medical History Summary: Ms. Mittelstadt is a 64 y.o. 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  She was subsequently diagnosed with paroxysmal atrial fibrillation, as well as occasional NSVT.     (Please refer to APeX Allergies, Problems, Past Medical History, Past Surgical History, Social History, and Family History activities, Results for current data from these respective sections of the chart; these sections of the chart are also summarized in reports, including the Patient Summary Extracts found in Chart Review)      Summary of Outside Records:    Remote pacemaker interrogation 10/12/2019 showed 54 high rate atrial episodes, with 46% A-pacing and 4% V-pacing.  AT/AF burden was 3% -- the longest episode was 08/01/2019 for 6 hours and 24 minutes (though other shorter episodes may have been part of one longer episode, including on 10/11/2019).  Histograms show predominant V rates 60-140 bpm with minimal counts in bins to 180 bpm during AF.       Cardiac PET 02/17/2018:  1. Normal perfusion in the left ventricle without myocardial uptake of FDG. Findings are suggestive of no active myocardial inflammatory process or scar.  2. Normal left ventricular wall motion with a calculated left  ventricular ejection fraction of >65% on rest perfusion images.     Cardiac MRI 12/31/2017:  1. Mid wall and subepicardial late gadolinium enhancement in the lateral wall at the level of the base which can be seen with myocarditis or other inflammatory cardiomyopathy. Remaining myocardium demonstrates no evidence of scar.  2. Normal left ventricular function (ejection fraction 73%).~~~~~~~~~~~~~~~~~~~~~~~~     PFTs 09/08/2015:  - FVC, FEV1, FEV1/FVC ratio, and FEF25-75% within normal limits  - TLC within normal limits  - FRC and RV increased  - With bronchodilators, there is significant response.  - Diffusing capacity high.  - Conclusions: Increased diffusing capacity consistent with left heart failure or shunting.  Although flow rates are within normal limits, the overinflation and response to bronchodilators are characteristic of reactive airways ("minimal")    ~~~~~~~~~~~~~~~~~~~~~~~~  Summary of Prior Anesthetics: Previous anesthetic with [facility]  Prolong awakening  Per pt does not require a lot of rx       Current Medications       Dosage    apixaban (ELIQUIS) 5 mg tablet Take 1 tablet (5 mg total) by mouth 2 (two) times daily    metoprolol tartrate (LOPRESSOR) 25 mg tablet Take 12.5 mg by mouth Twice a day           Allergies/Contraindications   Allergen Reactions    Sulfamethoxazole-Trimethoprim Itching  Insomnia     Past Medical History:   Diagnosis Date    Anticoagulated     Chronotropic incompetence 10/18/2015    History of motion sickness     IBS (irritable bowel syndrome) 10/18/2015    IBS (irritable bowel syndrome)     Mobitz type 2 second degree atrioventricular block 10/18/2015    s/p pacemaker     Pacemaker 11/29/2015    RBBB 10/18/2015    Thyroid nodule 10/18/2015    since 2003     Past Surgical History:   Procedure Laterality Date    CLAVICLE SURGERY  2004    HYSTERECTOMY  2009    left shoulder       soft tissue repair      Vitals:    12/10/19 1542   Weight: 63.5 kg (140 lb)   Height: 162.6  cm (5\' 4" )     10/31/2018 ECHO see scanned    Review of Systems Functional Status: Walks daily   gets DOE with stairs   Constitutional: Negative.    Airway: Positive for limited neck movement (per pt stiff vertebrae). Negative for difficulty opening mouth, loose teeth, dental hardware, other dental problems, TMJ problem, snoring and witnessed apnea  HENT: Negative.    Respiratory: Positive for shortness of breath. Negative for Recent URI Symptoms.    Cardiovascular: Negative for orthopnea, palpitations and chest pain.   Gastrointestinal: Negative for Negative for GERD symptoms.   Musculoskeletal:        H/o back injury, has difficult time laying down flat on her back.    Neurological: Negative for headaches.   Hematological: Bruises/bleeds easily.   Psychiatric/Behavioral/Developmental: Positive for sleep disturbance. Negative for dysphoric mood.       Physical Exam    Airway:   Modified Mallampati score: II. Thyromental distance: > 6.5 cm. Mouth opening: good. Neck range of motion: full. HENT:      Mouth/Throat:      Dentition: Normal.   Cardiovascular:      Rate and Rhythm: Regular rhythm.      Heart sounds: Normal heart sounds.   Pulmonary:      Breath sounds: Normal breath sounds.   Neurological:   Neurological system normal.      Dental: dentition is normal.          Prepare (Pre-Operative Clinic) Assessment/Plan/Narrative  NPO instructions emailed to the patient   Phone consult only, please do PE day of OR  [facility]; pt will do on 6/24, instructed on type of the test /see CE for results.       Obstructive Sleep Apnea Screening  STOPBANG Score:      S Do you Snore loudly (louder than talking or loud  enough to be heard through closed doors)? No    T Do you often feel Tired, fatigued, or sleepy during daytime? No    O Has anyone Observed you stop breathing during  your sleep? No    P Do you have or are you being treated for high blood Pressure? No    B BMI more than 35 KG/m^2? No    A Age over 42 years  old? Yes    N Neck circumference > 16 inches (40cm)? No    G Gender: Female? No    STOPBANG Total Score 1         Risk Level (based only on STOPBANG) Low       CPAP/BiPAP prescribed - No.  Anesthesia Assessment and Plan  Day Of Surgery Provider Chart Review:  NPO status verified  Medications reviewed  Allergies reviewed  Problem list reviewed  Anesthesia history reviewed  Pertinent labs reviewed  Consults reviewed    ASA 3       Anesthesia Plan  Anesthesia Type: general  Induction Technique: Inhalational  Invasive Monitors/Vascular Access: None  Airway Plan: oral ET tube  Possible Advanced Airway Techniques: None  Other Techniques: None  Planned Recovery Location: PACU    Blood Product PreparationBlood Products Plan: N/A, minimal risk    Informed Consent for Anesthesia    Risks, benefits and alternatives including those of invasive monitoring discussed. Increased risks (as above) discussed.  Questions invited and all answered.  Consent granted for anesthetic plan    Quality Measure Documentation   Opioid Therapy Planned? Yes    (See Anesthesia Record for attending attestation)    [Please note, smart link data included in this note may not reflect changes since note creation. Please see appropriate section of APeX for up-to-the minute information.]

## 2019-12-11 LAB — SURGICAL PATHOLOGY

## 2019-12-12 NOTE — Anesthesia Postprocedure Evaluation (Signed)
Anesthesia Post Note  Patient: Sarah Brock  Procedure(s) Performed: OPEN REPAIR OF SUPRA-UMBILICAL VENTRAL HERNIA WITH MESH (N/A Abdomen)     Patient location during evaluation: PACU Anesthesia Type: General Level of consciousness: awake and alert Pain management: pain level controlled Vital Signs Assessment: post-procedure vital signs reviewed and stable Respiratory status: spontaneous breathing, nonlabored ventilation, respiratory function stable and patient connected to nasal cannula oxygen Cardiovascular status: blood pressure returned to baseline and stable Postop Assessment: no apparent nausea or vomiting Anesthetic complications: no   No complications documented.  Last Vitals:  Vitals:   12/10/19 1415 12/10/19 1445  BP: (!) 141/78 137/72  Pulse: 84 78  Resp: 16 (!) 25  Temp:  (!) 36.2 C  SpO2: 99% 99%    Last Pain:  Vitals:   12/10/19 1445  PainSc: 3                  Chonita Gadea

## 2019-12-12 NOTE — Telephone Encounter (Signed)
No answer. Sent mychart message also forwarded pt's message to Dr. Rachelle Hora

## 2019-12-21 LAB — CHOLESTEROL, LDL (INCL. TOT. A
Chol HDL Ratio: 3.1 (ref <6.0)
Cholesterol, HDL: 62 mg/dL (ref >39)
Cholesterol, Total: 195 mg/dL (ref <200)
LDL Cholesterol: 119 mg/dL (ref <130)
Non HDL Cholesterol: 133 mg/dL (ref <160)
Triglycerides, serum: 69 mg/dL (ref <200)

## 2019-12-21 LAB — UNACCEPTABLE SPECIMEN

## 2019-12-21 LAB — TYPE AND SCREEN
ABO/RH(D): O NEG
Antibody Screen: NEGATIVE

## 2019-12-21 LAB — HEMOGLOBIN A1C: Hemoglobin A1c: 5.2 PERCENT (ref 4.3–5.6)

## 2019-12-21 LAB — ABO/RH CONFIRMATION (REQUIRES: ABO/RH(D): O NEG

## 2019-12-21 LAB — C-REACTIVE PROTEIN: C-Reactive Protein: 0.4 mg/L (ref <5.1)

## 2019-12-21 MED ORDER — ACETAMINOPHEN 325 MG TABLET
325 | Freq: Four times a day (QID) | ORAL | Status: DC | PRN
Start: 2019-12-21 — End: 2019-12-22
  Administered 2019-12-22: 09:00:00 650 mg via ORAL

## 2019-12-21 MED ORDER — PROTAMINE 10 MG/ML INTRAVENOUS SOLUTION
10 | INTRAVENOUS | Status: AC
Start: 2019-12-21 — End: 2019-12-21

## 2019-12-21 MED ORDER — OXYCODONE 5 MG TABLET
5 | Freq: Four times a day (QID) | ORAL | Status: DC | PRN
Start: 2019-12-21 — End: 2019-12-22

## 2019-12-21 MED ORDER — FENTANYL (PF) 50 MCG/ML INJECTION SOLUTION
50 | INTRAMUSCULAR | Status: DC | PRN
Start: 2019-12-21 — End: 2019-12-21

## 2019-12-21 MED ORDER — ACETAMINOPHEN 500 MG GELCAP
500 | ORAL | Status: AC
Start: 2019-12-21 — End: 2019-12-21
  Administered 2019-12-22: 01:00:00 500 via ORAL

## 2019-12-21 MED ORDER — ACETAMINOPHEN 325 MG TABLET
325 | ORAL | Status: AC
Start: 2019-12-21 — End: 2019-12-21

## 2019-12-21 MED ORDER — ROCURONIUM 10 MG/ML INTRAVENOUS SOLUTION
10 | INTRAVENOUS | Status: DC | PRN
Start: 2019-12-21 — End: 2019-12-21
  Administered 2019-12-21: 16:00:00 30 via INTRAVENOUS

## 2019-12-21 MED ORDER — PROTAMINE 10 MG/ML INTRAVENOUS SOLUTION
10 | INTRAVENOUS | Status: DC | PRN
Start: 2019-12-21 — End: 2019-12-21
  Administered 2019-12-21: 22:00:00 30 via INTRAVENOUS

## 2019-12-21 MED ORDER — BUPIVACAINE (PF) 0.5 % (5 MG/ML) INJECTION SOLUTION
5 | INTRAMUSCULAR | Status: AC
Start: 2019-12-21 — End: 2019-12-21

## 2019-12-21 MED ORDER — ACETAMINOPHEN 500 MG GELCAP
500 | Freq: Four times a day (QID) | ORAL | Status: DC | PRN
Start: 2019-12-21 — End: 2019-12-21

## 2019-12-21 MED ORDER — ONDANSETRON HCL (PF) 4 MG/2 ML INJECTION SOLUTION
4 | INTRAMUSCULAR | Status: DC | PRN
Start: 2019-12-21 — End: 2019-12-21
  Administered 2019-12-21: 22:00:00 4 via INTRAVENOUS

## 2019-12-21 MED ORDER — SODIUM CHLORIDE 0.9 % (FLUSH) INJECTION SYRINGE
0.9 | INTRAMUSCULAR | Status: DC | PRN
Start: 2019-12-21 — End: 2019-12-22

## 2019-12-21 MED ORDER — LIDOCAINE (PF) 10 MG/ML (1 %) INJECTION SOLUTION
10 | INTRAMUSCULAR | Status: AC
Start: 2019-12-21 — End: 2019-12-21

## 2019-12-21 MED ORDER — HEPARIN (PORCINE) 1,000 UNIT/ML INJECTION SOLUTION
1000 | INTRAMUSCULAR | Status: DC | PRN
Start: 2019-12-21 — End: 2019-12-21
  Administered 2019-12-21: 17:00:00 8000 via INTRAVENOUS

## 2019-12-21 MED ORDER — ONDANSETRON HCL (PF) 4 MG/2 ML INJECTION SOLUTION
4 | INTRAMUSCULAR | Status: AC
Start: 2019-12-21 — End: 2019-12-21
  Administered 2019-12-22: 02:00:00 4 via INTRAVENOUS

## 2019-12-21 MED ORDER — HEPARIN (PORCINE) 25,000 UNIT/250 ML IN 0.45 % SODIUM CHLORIDE IV SOLN
25000 | INTRAVENOUS | Status: AC
Start: 2019-12-21 — End: 2019-12-21

## 2019-12-21 MED ORDER — LACTATED RINGERS INTRAVENOUS SOLUTION
INTRAVENOUS | Status: DC
Start: 2019-12-21 — End: 2019-12-21

## 2019-12-21 MED ORDER — NOREPINEPHRINE BITARTRATE 1 MG/ML INTRAVENOUS SOLUTION
1 mg/mL | INTRAVENOUS | Status: DC | PRN
Start: 2019-12-21 — End: 2019-12-21
  Administered 2019-12-21: 17:00:00 2 via INTRAVENOUS

## 2019-12-21 MED ORDER — ELECTROLYTE-A INTRAVENOUS SOLUTION
INTRAVENOUS | Status: DC | PRN
Start: 2019-12-21 — End: 2019-12-21
  Administered 2019-12-21: 16:00:00 via INTRAVENOUS

## 2019-12-21 MED ORDER — APIXABAN 5 MG TABLET
5 | Freq: Two times a day (BID) | ORAL | Status: DC
Start: 2019-12-21 — End: 2019-12-22
  Administered 2019-12-22 (×2): 5 mg via ORAL

## 2019-12-21 MED ORDER — PHENYLEPHRINE 10 MG/ML INJECTION SOLUTION
10 | INTRAMUSCULAR | Status: DC | PRN
Start: 2019-12-21 — End: 2019-12-21
  Administered 2019-12-21 (×2): 100 via INTRAVENOUS

## 2019-12-21 MED ORDER — LIDOCAINE (PF) 100 MG/5 ML (2 %) INTRAVENOUS SYRINGE
100 | INTRAVENOUS | Status: DC | PRN
Start: 2019-12-21 — End: 2019-12-21
  Administered 2019-12-21: 16:00:00 60 via INTRAVENOUS

## 2019-12-21 MED ORDER — HYDROMORPHONE (PF) 0.5 MG/0.5 ML INJECTION SYRINGE
0.5 | INTRAMUSCULAR | Status: DC | PRN
Start: 2019-12-21 — End: 2019-12-21

## 2019-12-21 MED ORDER — FENTANYL (PF) 50 MCG/ML INJECTION SOLUTION
50 | INTRAMUSCULAR | Status: DC | PRN
Start: 2019-12-21 — End: 2019-12-21
  Administered 2019-12-21 (×2): 50 via INTRAVENOUS

## 2019-12-21 MED ORDER — HALOPERIDOL LACTATE 5 MG/ML INJECTION SOLUTION
5 | Freq: Four times a day (QID) | INTRAMUSCULAR | Status: DC | PRN
Start: 2019-12-21 — End: 2019-12-21

## 2019-12-21 MED ORDER — HEPARIN (PORCINE) (PF) 2,000 UNIT/1,000 ML IN 0.9 % SODIUM CHLORIDE IV
2000 | INTRAVENOUS | Status: AC
Start: 2019-12-21 — End: 2019-12-21

## 2019-12-21 MED ORDER — HALOPERIDOL LACTATE 5 MG/ML INJECTION SOLUTION
5 | INTRAMUSCULAR | Status: DC | PRN
Start: 2019-12-21 — End: 2019-12-21
  Administered 2019-12-21: 22:00:00 1 via INTRAVENOUS

## 2019-12-21 MED ORDER — ACETAMINOPHEN 500 MG TABLET
500 | Freq: Three times a day (TID) | ORAL | Status: DC | PRN
Start: 2019-12-21 — End: 2019-12-21

## 2019-12-21 MED ORDER — ACETAMINOPHEN 500 MG GELCAP
500 | Freq: Once | ORAL | Status: AC
Start: 2019-12-21 — End: 2019-12-21

## 2019-12-21 MED ORDER — HEPARIN (PORCINE) 1,000 UNIT/ML INJECTION SOLUTION
1000 | INTRAMUSCULAR | Status: AC
Start: 2019-12-21 — End: 2019-12-21

## 2019-12-21 MED ORDER — LANSOPRAZOLE 30 MG CAPSULE,DELAYED RELEASE
30 | Freq: Every day | ORAL | Status: DC
Start: 2019-12-21 — End: 2019-12-22
  Administered 2019-12-22: 16:00:00 30 mg via ORAL

## 2019-12-21 MED ORDER — ACETAMINOPHEN 500 MG TABLET
500 | ORAL | Status: AC
Start: 2019-12-21 — End: 2019-12-21
  Administered 2019-12-22: 03:00:00 650 via ORAL

## 2019-12-21 MED ORDER — ONDANSETRON HCL (PF) 4 MG/2 ML INJECTION SOLUTION
4 | Freq: Once | INTRAMUSCULAR | Status: AC
Start: 2019-12-21 — End: 2019-12-21

## 2019-12-21 MED ORDER — SODIUM CHLORIDE 0.9 % (FLUSH) INJECTION SYRINGE
0.9 | Freq: Three times a day (TID) | INTRAMUSCULAR | Status: DC
Start: 2019-12-21 — End: 2019-12-22
  Administered 2019-12-22 (×3): 3 mL via INTRAVENOUS

## 2019-12-21 MED ORDER — ONDANSETRON HCL (PF) 4 MG/2 ML INJECTION SOLUTION
4 | Freq: Four times a day (QID) | INTRAMUSCULAR | Status: DC | PRN
Start: 2019-12-21 — End: 2019-12-22

## 2019-12-21 MED ORDER — PROPOFOL 10 MG/ML INTRAVENOUS EMULSION
10 | INTRAVENOUS | Status: DC | PRN
Start: 2019-12-21 — End: 2019-12-21
  Administered 2019-12-21: 16:00:00 130 via INTRAVENOUS

## 2019-12-21 MED ORDER — HYDROCODONE 5 MG-ACETAMINOPHEN 325 MG TABLET
5-325 | Freq: Four times a day (QID) | ORAL | Status: DC | PRN
Start: 2019-12-21 — End: 2019-12-22

## 2019-12-21 MED ORDER — LACTATED RINGERS INTRAVENOUS SOLUTION
INTRAVENOUS | Status: DC | PRN
Start: 2019-12-21 — End: 2019-12-21
  Administered 2019-12-21: 16:00:00 via INTRAVENOUS

## 2019-12-21 MED ORDER — METOPROLOL TARTRATE 25 MG TABLET
25 | Freq: Two times a day (BID) | ORAL | Status: DC
Start: 2019-12-21 — End: 2019-12-22
  Administered 2019-12-22 (×2): 12.5 mg via ORAL

## 2019-12-21 MED FILL — HEPARIN (PORCINE) 25,000 UNIT/250 ML IN 0.45 % SODIUM CHLORIDE IV SOLN: 25000 units/250 mL (100 units/mL) | INTRAVENOUS | Qty: 250

## 2019-12-21 MED FILL — BUPIVACAINE (PF) 0.5 % (5 MG/ML) INJECTION SOLUTION: 5 mg/mL (0. %) | INTRAMUSCULAR | Qty: 30

## 2019-12-21 MED FILL — HEPARIN (PORCINE) 1,000 UNIT/ML INJECTION SOLUTION: 1000 unit/mL | INTRAMUSCULAR | Qty: 10

## 2019-12-21 MED FILL — HEPARIN (PORCINE) (PF) 2,000 UNIT/1,000 ML IN 0.9 % SODIUM CHLORIDE IV: 2000 units/1,000 mL (2 units/mL) | INTRAVENOUS | Qty: 6000

## 2019-12-21 MED FILL — XYLOCAINE-MPF 10 MG/ML (1 %) INJECTION SOLUTION: 10 mg/mL (1 %) | INTRAMUSCULAR | Qty: 30

## 2019-12-21 MED FILL — PROTAMINE 10 MG/ML INTRAVENOUS SOLUTION: 10 mg/mL | INTRAVENOUS | Qty: 5

## 2019-12-21 MED FILL — ACETAMINOPHEN 500 MG TABLET: 500 mg | ORAL | Qty: 1

## 2019-12-21 NOTE — Discharge Instructions (Addendum)
GOING HOME AFTER YOUR ABLATION   Here are some important items to remember to make things easier for you at home:   Take it easy for the first 7 days after your procedure. After those 7 days, you can go back to your normal activity.   No lifting of objects heavier than 10 lbs for the first week after your ablation.   You can shower tomorrow, and you can bathe after 14 days. Pat the skin dry instead of rubbing around the sites where the catheters were put in.   You can return to exercise like tennis, golf, biking, or swimming after 7 days. Ask your doctor when to return to activities like scuba diving, horseback riding, competitive bike riding, heavy weight lifting, or hunting.   You may return to sexual activity 5 days after the procedure.    APPOINTMENTS:    1) Your appointment with your doctor at [facility] is scheduled on         Date: Tuesday, February 02, 2020        Time: 1:15 PM        Provider: Mike Craze        Zoom Meeting Link: [email or web address]        Zoom Meeting ID: [phone]       Please call [phone] if you need to change your appointment. If you get an answering machine, please leave a message with your name and phone number, and we will call you back as soon as possible.    2) You should see your primary care doctor. Please call  to set up an appt     For questions about your ablation, the doctors in the Cardiac Electrophysiology Department may be reached at [phone]. You may also leave a message or contact the nurses at this number. If you have any problems or questions on the weekend, you can have the doctor on call for the Electrophysiology service paged by calling the same number. If you have any questions related to your heart, but not related to your ablation procedure, please call your regular heart doctor (cardiologist) or primary health care practitioner.    It is very common to have a large bruise after your ablation from your groin to  above your knee around the site where the catheters were put in. Although problems are rare, we want to hear from you if you should have any problems. Be sure to phone the doctor right away if you notice:        Fainting or near fainting   Very slow (slower than 45 beats per minute) or very fast heartbeat   Dizziness or unusual fatigue   Unusual warmth, redness, bleeding, or drainage of fluid at the site where the catheters were inserted   An increase in soreness or swelling at the site where the catheters were inserted     A very rare (<0.5 %) complication that can present several weeks after the ablation procedure is atrial esophageal fistula (AEF); The majority of signs and symptoms of AEF are not specific and may include fever, fatigue, malaise, chest discomfort, nausea, vomiting, difficulty swallowing, painful swallowing, vomiting blood, blood seen in your stool, or black/tarry stools, and shortness of breath. This is a very rare but serious complication and you should call [facility] promptly if you are having these symptoms, or seek immediate medical care in your closest emergency room.    If you have any urgent problem, please go to  the nearest emergency room.  Ask them to page the physician on call for Electrophysiology at [phone].    The purpose of the ablation is to help your heart beat in a more normal way, so that you can more easily go about your life. You should be as active as you can comfortably be. Were looking forward to seeing you after the procedure.      Other instructions:    1) please continue your anticoagulation Eliquis (apixaban) 5mg  daily. Please do not miss any doses.    2) In the future, you will be sent a Lifewatch monitor at 6 and 12 months following your procedure. This is standard protocol for patients who undergo ablation for atrial fibrillation. This is to check for any asymptomatic atrial fibrillation. It is a patch that you will wear on your chest for 2  weeks.     3) We have prescribed a medication called Lansoprazole. This is to protect your esopahgus after the ablation procedure and prevent reflux and heart burn symptoms which are common after the procedure. Please take this medication for 14 days then stop.    Important telephone numbers (24 hours a day):   Cardiac Electrophysiology Department: [phone]     See below for Information about modifiable risk factors associated with atrial fibrillation that can be targeted through optimal treatment and lifestyle intervention. Please talk to your electrophysiologist, cardiologist, or primary care provider to see if there are things you can do to reduce your risk of recurrent atrial fibrillation by taking action on these risk factors listed below:      Heart Failure, if you have heart failure, talk with your cardiologist to see if you are on all the best medications to treat your heart failure.    o Evidence suggest that if your heart failure is well treated, this can reduce atrial fibrillation recurrence.     High blood pressure or hypertension  o Even mildly elevated blood pressure increases your risk for developing atrial fibrillation.   o Ensure your blood pressure is well controlled to help reduce atrial fibrillation recurrence.     Diabetes. If your diabetes is poorly controlled, this increases the risk of developing atrial fibrillation.      Obesity. If you are overweight, or obese, this increases the risk of atrial fibrillation.   o Weight loss can help prevent development of atrial fibrillation and/or reduce atrial fibrillation recurrence.  o Exercise and dietary modifications can help you lose weight.   o Please talk to your doctor about a weight loss plan that is right for you.      Sleep apnea, 50% or more of patients with atrial fibrillation have obstructive sleep apnea, yet many people with sleep apnea have not been diagnosed, or are not being adequately treated.    o Talk with your doctor to  see if you should be referred to undergo a sleep study to evaluate for obstructive sleep apnea.    o Treating sleep apnea can reduce atrial fibrillation recurrence, and help keep you in normal sinus rhythm following your ablation.     Alcohol consumption. Heavy alcohol consumption has long been a known cause of atrial fibrillation.    o However recent studies have shown that even small quantities of alcohol consumption can increase the risk of atrial fibrillation.  o Data is emerging that there is no safe level of chronic alcohol intake with regard to the development of atrial fibrillation.    o It  is best if you can abstain from alcohol to reduce risk of atrial fibrillation recurrence.     Smoking. Active tobacco use can increase the risk of atrial fibrillation.  o Stop smoking  o Talk with your doctor about a plan to help you stop     Lack of exercise or a Sedentary Lifestyle.   o Studies show that 220 minutes of moderate exercise per week (or ~30 minutes per day) can lower your risk of developing atrial fibrillation.    o Furthermore, 30 minutes of exercise per day 5-7 days per week can improve the chances of you staying in normal sinus rhythm after an ablation.      Optimal and timely management targeting ALL of these conditions, can reduce atrial fibrillation, help to keep you in normal sinus rhythm and and improve your quality of life.

## 2019-12-21 NOTE — Progress Notes (Signed)
Cardiac EP Pre-Procedure History and Physical    Primary Care Physician: Clois Dupes, MD  Procedure: AF ablation    History of Present Illness  22F with AVB s/p PPM and symptomatic PAF is here for AF ablation     #PAF: dx 03/2017  -overall burden remains low but symptomatic with palpitation  -on metop 12.5mg  BID  -CV2=1 for gender only. 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 Normal perfusion in the left ventricle without myocardial uptake of FDG. Findings are suggestive of no active myocardial inflammatory process or scar.  2. Normal left ventricular wall motion with a calculated left ventricular ejection fraction of >65% on rest perfusion images.               Cardiac MRI 12/31/2017:  1. Mid wall and subepicardial late gadolinium enhancement in the lateral wall at the level of the base which can be seen with myocarditis or other inflammatory cardiomyopathy. Remaining myocardium demonstrates no evidence of scar.  2. 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     Current Outpatient Medications   Medication Sig    apixaban (ELIQUIS) 5 mg tablet Take 1 tablet (5 mg total) by mouth 2 (two) times daily    metoprolol tartrate (LOPRESSOR) 25 mg tablet Take 12.5 mg by mouth Twice a day           Allergies/Contraindications   Allergen Reactions    Sulfamethoxazole-Trimethoprim Itching     Insomnia       FAMILY HISTORY: NC    SOCIAL HISTORY:  She  reports that she has never smoked. She has never used smokeless tobacco. She reports current alcohol use of about 1.0 standard drinks of alcohol per week. She reports that she does not use drugs..   Social History     Social History Narrative    Not on file        ROS  negataive 10 point ROS beside HPI  \  Physical Exam  There were no vitals taken for this visit.  CONSTITUTIONAL: NAD, pleasant and comfortable.  HEAD: Normocephalic.   EYES: Normal conjunctivae.   NECK:  JVP < 6 cmH2O.   RESPIRATORY: Clear to auscultation bilaterally, good air entry.  CARDIOVASCULAR: Regular, normal S1/S2, no murmurs, rubs, or gallops.   ABDOMEN: soft, non-tender.  EXTREMITIES: No edema. Femoral pulses normal without bruits, hematoma or pulsatile mass.  SKIN: Warm, well-perfused.   NEUROLOGIC: Alert and oriented to person, place, and situation.   PSYCHIATRIC: Normal speech and affect.     Data:  No results found for: NA, K,  CL, CO2, BUN, CREAT, GLU  No results found for: WBC, WBCM, WCBD, HGB, HGBM, HCT, HCTM, NSH, HCT22, MCV, PLT, CCP  No results found for: INR, PT, LABPROT, PROTIME  No results found for: PTT      Assessment & Plan    8F with AVB s/p PPM and symptomatic PAF is here for AF ablation   CV2=1  ON ELIQUIS uninterrupted  Last dose this morning      We discussed the risks, benefits and alternatives of the procedure and based on shared decision making, are going to proceed with the procedure. All questions were answered.     Consent  Electrophysiology Study / Transseptal Puncture / Ablation  The following information was discussed:   The nature and character of the proposed treatment or procedure.   The anticipated results.   Possible recognized alternative methods of treatment including non-treatment.   Recognized serious possible risks, complication (including but  not limited to bleeding, infection, atrioventricular block requiring a pacemaker pneumothorax, hemothorax, perforation, tamponade, stroke, heart attack, death), and anticipated benefits involved in proposed and alternative treatments including non-treatment.  Questions were answered to their satisfaction. Consent signed by patient.      Henrietta Hoover MD  Cardiac Electrophysiology Fellow  12/20/2019

## 2019-12-21 NOTE — Discharge Summary (Addendum)
This is a shared service.     [facility] - DISCHARGE SUMMARY     Patient Name: Debbie Harper  Patient MRN: [id number]  Date of Birth: [birth year]    Facility: [facility]  Attending Physician: Mike CrazeJOSHUA D MOSS, MD    Date of Admission: 12/21/2019  Date of Discharge: 12/22/2019    Admission Diagnosis: Paroxysmal atrial fibrillation  Discharge Diagnosis: Paroxysmal atrial fibrillation (CMS code)    Discharge Disposition: Home    History (with Chief Complaint)    Debbie Canningatricia Froh is a 64 y.o. 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 She is now on Eliquis 5mg  daily and denies missing any doses prior to procedure. She had a follow-up echocardiogram in early May 2020 showing normal LV function. In 10/2018, she was feeing "pretty good". She had some sensatione of an irregular heart beat at night affecting her sleep. Device interrogation showed rare brief NSVT. Device interrogation 10/12/2019 showed 54 high rate atrial episodes, with burden 3% -- the longest was 08/01/2019 for 6 hours and 24 minutes.    On 12/21/2019 she presented in [facility] w/ fist degree AV block for EP Study, mapping and ablation.      Brief Hospital Course by Problem  #Afib s/p EP Study and ablation 12/21/2019  # AV block (s/p AV pacemaker 10/2015)  On 12/21/2019 underwent successful ablation, with entrance and exit block confirmed for all four pulmonary veins. No new pericardial effusion was noted after the procedure. The right femoral vein and left femoral vein were closed with Vascade vascular closure device. The pacemaker was reinterrogated and confirmed to have stable lead parameters. Please see procedure note for settings.   On the day of discharge patient denies any pain, shortness of breath or chest palpations. Her incision sites were dry and intact. No or VTs on tele overnight, was SR-ST with a first degree AV block.   --Continue on home Eliquis 5mg  and Metoprolol 25mg  BID  --Written for  Lansoprazole (Prevacid) prescription for 14 days to prevent AEF  --Follow up appointment scheduled with [facility]-Dr. Rachelle HoraMoss in August 2021    Service: Cardiology Procedure Service    Physical Exam at Discharge  BP (!) 113/40 (BP Location: Left upper arm, Patient Position: Sitting)   Pulse 81   Temp 36.1 C (96.9 F) (Oral)   Resp 18   Ht 162.6 cm (5' 4.02")   Wt 64.6 kg (142 lb 6.7 oz)   SpO2 98%   Breastfeeding No   BMI 24.43 kg/m       Intake/Output Summary (Last 24 hours) at 12/22/2019 1524  Last data filed at 12/22/2019 0845  Gross per 24 hour   Intake 720 ml   Output 1350 ml   Net -630 ml       Physical Exam  General: NAD  Neck: No elevated JVP appreciated  CV: RRR, Nl S1, S2, No Murmurs   Lungs: CTA bilateral with no rhonchi, wheezing or rales  Groin sites: bilateral access sites are without active bleeding or hematoma  Ext: No peripheral edema. +2DP pulses    Relevant Labs, Radiology, and Other Studies  ECG 12/21/2019 post procedure  IMPRESSION:   Abnormal record  Normal sinus rhythm  Right bundle branch block  When compared with ECG of 21-Dec-2019 16:38,  No significant change       Procedures Performed and Complications    EP Study, mapping and ablation ; final report pending  at the time of discharge      DISCHARGE INSTRUCTIONS  GOING HOME AFTER YOUR ABLATION   Here are some important items to remember to make things easier for you at home:   Take it easy for the first 7 days after your procedure. After those 7 days, you can go back to your normal activity.   No lifting of objects heavier than 10 lbs for the first week after your ablation.   You can shower tomorrow, and you can bathe after 14 days. Pat the skin dry instead of rubbing around the sites where the catheters were put in.   You can return to exercise like tennis, golf, biking, or swimming after 7 days. Ask your doctor when to return to activities like scuba diving, horseback riding, competitive bike riding, heavy weight lifting, or  hunting.   You may return to sexual activity 5 days after the procedure.    APPOINTMENTS:    1) Your appointment with your doctor at [facility] is scheduled on         Date: Tuesday, February 02, 2020        Time: 1:15 PM        Provider: Mike Craze        Zoom Meeting Link: [email or web address]        Zoom Meeting ID: [phone]       Please call [phone] if you need to change your appointment. If you get an answering machine, please leave a message with your name and phone number, and we will call you back as soon as possible.    2) You should see your primary care doctor. Please call  to set up an appt     For questions about your ablation, the doctors in the Cardiac Electrophysiology Department may be reached at [phone]. You may also leave a message or contact the nurses at this number. If you have any problems or questions on the weekend, you can have the doctor on call for the Electrophysiology service paged by calling the same number. If you have any questions related to your heart, but not related to your ablation procedure, please call your regular heart doctor (cardiologist) or primary health care practitioner.    It is very common to have a large bruise after your ablation from your groin to above your knee around the site where the catheters were put in. Although problems are rare, we want to hear from you if you should have any problems. Be sure to phone the doctor right away if you notice:        Fainting or near fainting   Very slow (slower than 45 beats per minute) or very fast heartbeat   Dizziness or unusual fatigue   Unusual warmth, redness, bleeding, or drainage of fluid at the site where the catheters were inserted   An increase in soreness or swelling at the site where the catheters were inserted     A very rare (<0.5 %) complication that can present several weeks after the ablation procedure is atrial esophageal fistula (AEF); The majority of  signs and symptoms of AEF are not specific and may include fever, fatigue, malaise, chest discomfort, nausea, vomiting, difficulty swallowing, painful swallowing, vomiting blood, blood seen in your stool, or black/tarry stools, and shortness of breath. This is a very rare but serious complication and you should call [facility] promptly if you are having these symptoms, or seek immediate medical care in your closest  emergency room.    If you have any urgent problem, please go to the nearest emergency room.  Ask them to page the physician on call for Electrophysiology at [phone].    The purpose of the ablation is to help your heart beat in a more normal way, so that you can more easily go about your life. You should be as active as you can comfortably be. Were looking forward to seeing you after the procedure.      Other instructions:    1) please continue your anticoagulation Eliquis (apixaban) 5mg  daily. Please do not miss any doses.    2) In the future, you will be sent a Lifewatch monitor at 6 and 12 months following your procedure. This is standard protocol for patients who undergo ablation for atrial fibrillation. This is to check for any asymptomatic atrial fibrillation. It is a patch that you will wear on your chest for 2 weeks.     3) We have prescribed a medication called Lansoprazole. This is to protect your esopahgus after the ablation procedure and prevent reflux and heart burn symptoms which are common after the procedure. Please take this medication for 14 days then stop.    Important telephone numbers (24 hours a day):   Cardiac Electrophysiology Department: [phone]     See below for Information about modifiable risk factors associated with atrial fibrillation that can be targeted through optimal treatment and lifestyle intervention. Please talk to your electrophysiologist, cardiologist, or primary care provider to see if there are things you can do to reduce your risk of  recurrent atrial fibrillation by taking action on these risk factors listed below:      Heart Failure, if you have heart failure, talk with your cardiologist to see if you are on all the best medications to treat your heart failure.    o Evidence suggest that if your heart failure is well treated, this can reduce atrial fibrillation recurrence.     High blood pressure or hypertension  o Even mildly elevated blood pressure increases your risk for developing atrial fibrillation.   o Ensure your blood pressure is well controlled to help reduce atrial fibrillation recurrence.     Diabetes. If your diabetes is poorly controlled, this increases the risk of developing atrial fibrillation.      Obesity. If you are overweight, or obese, this increases the risk of atrial fibrillation.   o Weight loss can help prevent development of atrial fibrillation and/or reduce atrial fibrillation recurrence.  o Exercise and dietary modifications can help you lose weight.   o Please talk to your doctor about a weight loss plan that is right for you.      Sleep apnea, 50% or more of patients with atrial fibrillation have obstructive sleep apnea, yet many people with sleep apnea have not been diagnosed, or are not being adequately treated.    o Talk with your doctor to see if you should be referred to undergo a sleep study to evaluate for obstructive sleep apnea.    o Treating sleep apnea can reduce atrial fibrillation recurrence, and help keep you in normal sinus rhythm following your ablation.     Alcohol consumption. Heavy alcohol consumption has long been a known cause of atrial fibrillation.    o However recent studies have shown that even small quantities of alcohol consumption can increase the risk of atrial fibrillation.  o Data is emerging that there is no safe level of chronic alcohol intake  with regard to the development of atrial fibrillation.    o It is best if you can abstain from alcohol to reduce risk of atrial  fibrillation recurrence.     Smoking. Active tobacco use can increase the risk of atrial fibrillation.  o Stop smoking  o Talk with your doctor about a plan to help you stop     Lack of exercise or a Sedentary Lifestyle.   o Studies show that 220 minutes of moderate exercise per week (or ~30 minutes per day) can lower your risk of developing atrial fibrillation.    o Furthermore, 30 minutes of exercise per day 5-7 days per week can improve the chances of you staying in normal sinus rhythm after an ablation.      Optimal and timely management targeting ALL of these conditions, can reduce atrial fibrillation, help to keep you in normal sinus rhythm and and improve your quality of life.    Discharge Diet  Regular Diet    Functional Assessment at Discharge/Activity Goals   Take it easy for the first 7 days after your procedure. After those 7 days, you can go back to your normal activity.   No lifting of objects heavier than 10 lbs for the first week after your ablation.   You can shower tomorrow, and you can bathe after 14 days. Pat the skin dry instead of rubbing around the sites where the catheters were put in.   You can return to exercise like tennis, golf, biking, or swimming after 7 days. Ask your doctor when to return to activities like scuba diving, horseback riding, competitive bike riding, heavy weight lifting, or hunting.   You may return to sexual activity 5 days after the procedure.    Allergies and Medications at Discharge    Allergies: Sulfamethoxazole-trimethoprim    Your Medications at the End of This Hospitalization       Disp Refills Start End    apixaban (ELIQUIS) 5 mg tablet 180 tablet 3 10/20/2019     Sig - Route: Take 1 tablet (5 mg total) by mouth 2 (two) times daily - Oral    metoprolol tartrate (LOPRESSOR) 25 mg tablet        Sig - Route: Take 12.5 mg by mouth Twice a day    - Oral    Class: Historical Med    lansoprazole (PREVACID) 30 mg capsule 14 capsule 0 12/23/2019     Sig - Route:  Take 1 capsule (30 mg total) by mouth daily Please take medication for two weeks then stop. - Oral    Renewals     Renewal requests to authorizing provider Thurston Hole Azalia Bilis Destruel, NP) <b>prohibited</b>          estradiol (ESTRING) 2 mg (7.5 mcg /24 hour) vaginal ring (Expired)    12/10/2019    Sig - Route: Place vaginally every 3 (three) months. Use as instructed - Vaginal    Class: Historical Med            Pending Tests  None    Outside Follow-up  None    Booked [facility] Appointments  Future Appointments   Date Time Provider Department Center   01/04/2020 10:00 AM Saundra Shelling, NP [id number]   02/02/2020  1:15 PM Mike Craze, MD [id number]       Pending [facility] Referrals  None    Case Management Services Arranged  Case Management Services Arranged: (all recorded)  Discharge Assessment  Condition at discharge:  good  Final Discharge Disposition: Home or Self Care    Covid-19 Vaccines     Name Date    Pfizer Sars-Cov-2 Vaccine 06/22/2019    Manufacturer: Pfizer, [employer].    Lot: [id number]    External: Costella Hatcher From Outside Source            Primary Care Physician  Clois Dupes  Address: [id number] / [place] [address]   Phone: [phone]  Fax: [phone]     Outside Providers, for pending tests please use the following numbers:   For [facility] - Please Call: [phone]    For [facility] - Please Call: [phone]   For [facility] - Please Call: [phone]    Signed,  Edyth Gunnels, NP  12/21/2019          Discharge Instructions provided to the patient (if any):    Discharge Instructions     [facility] AFTER YOUR ABLATION   Here are some important items to remember to make things easier for you at home:   Take it easy for the first 7 days after your procedure. After those 7 days, you can go back to your normal activity.   No lifting of objects heavier than 10 lbs for the first week after your ablation.   You can shower tomorrow, and  you can bathe after 14 days. Pat the skin dry instead of rubbing around the sites where the catheters were put in.   You can return to exercise like tennis, golf, biking, or swimming after 7 days. Ask your doctor when to return to activities like scuba diving, horseback riding, competitive bike riding, heavy weight lifting, or hunting.   You may return to sexual activity 5 days after the procedure.    APPOINTMENTS:    1) Your appointment with your doctor at [facility] is scheduled on         Date: Tuesday, February 02, 2020        Time: 1:15 PM        Provider: Mike Craze        Zoom Meeting Link: [email or web address]        Zoom Meeting ID: [phone]       Please call [phone] if you need to change your appointment. If you get an answering machine, please leave a message with your name and phone number, and we will call you back as soon as possible.    2) You should see your primary care doctor. Please call  to set up an appt     For questions about your ablation, the doctors in the Cardiac Electrophysiology Department may be reached at [phone]. You may also leave a message or contact the nurses at this number. If you have any problems or questions on the weekend, you can have the doctor on call for the Electrophysiology service paged by calling the same number. If you have any questions related to your heart, but not related to your ablation procedure, please call your regular heart doctor (cardiologist) or primary health care practitioner.    It is very common to have a large bruise after your ablation from your groin to above your knee around the site where the catheters were put in. Although problems are rare, we want to hear from you if you should have any problems. Be sure to phone the doctor right away if you notice:  Fainting or near fainting   Very slow (slower than 45 beats per minute) or very fast heartbeat   Dizziness or unusual fatigue   Unusual  warmth, redness, bleeding, or drainage of fluid at the site where the catheters were inserted   An increase in soreness or swelling at the site where the catheters were inserted     A very rare (<0.5 %) complication that can present several weeks after the ablation procedure is atrial esophageal fistula (AEF); The majority of signs and symptoms of AEF are not specific and may include fever, fatigue, malaise, chest discomfort, nausea, vomiting, difficulty swallowing, painful swallowing, vomiting blood, blood seen in your stool, or black/tarry stools, and shortness of breath. This is a very rare but serious complication and you should call [facility] promptly if you are having these symptoms, or seek immediate medical care in your closest emergency room.    If you have any urgent problem, please go to the nearest emergency room.  Ask them to page the physician on call for Electrophysiology at [phone].    The purpose of the ablation is to help your heart beat in a more normal way, so that you can more easily go about your life. You should be as active as you can comfortably be. Were looking forward to seeing you after the procedure.      Other instructions:    1) please continue your anticoagulation Eliquis (apixaban)  daily. Please do not miss any doses.    2) In the future, you will be sent a Lifewatch monitor at 6 and 12 months following your procedure. This is standard protocol for patients who undergo ablation for atrial fibrillation. This is to check for any asymptomatic atrial fibrillation. It is a patch that you will wear on your chest for 2 weeks.     3) We have prescribed a medication called Lansoprazole. This is to protect your esopahgus after the ablation procedure and prevent reflux and heart burn symptoms which are common after the procedure. Please take this medication for 14 days then stop.    Important telephone numbers (24 hours a day):   Cardiac Electrophysiology Department:  [phone]     See below for Information about modifiable risk factors associated with atrial fibrillation that can be targeted through optimal treatment and lifestyle intervention. Please talk to your electrophysiologist, cardiologist, or primary care provider to see if there are things you can do to reduce your risk of recurrent atrial fibrillation by taking action on these risk factors listed below:      Heart Failure, if you have heart failure, talk with your cardiologist to see if you are on all the best medications to treat your heart failure.    o Evidence suggest that if your heart failure is well treated, this can reduce atrial fibrillation recurrence.     High blood pressure or hypertension  o Even mildly elevated blood pressure increases your risk for developing atrial fibrillation.   o Ensure your blood pressure is well controlled to help reduce atrial fibrillation recurrence.     Diabetes. If your diabetes is poorly controlled, this increases the risk of developing atrial fibrillation.      Obesity. If you are overweight, or obese, this increases the risk of atrial fibrillation.   o Weight loss can help prevent development of atrial fibrillation and/or reduce atrial fibrillation recurrence.  o Exercise and dietary modifications can help you lose weight.   o Please talk to your  doctor about a weight loss plan that is right for you.      Sleep apnea, 50% or more of patients with atrial fibrillation have obstructive sleep apnea, yet many people with sleep apnea have not been diagnosed, or are not being adequately treated.    o Talk with your doctor to see if you should be referred to undergo a sleep study to evaluate for obstructive sleep apnea.    o Treating sleep apnea can reduce atrial fibrillation recurrence, and help keep you in normal sinus rhythm following your ablation.     Alcohol consumption. Heavy alcohol consumption has long been a known cause of atrial fibrillation.    o However  recent studies have shown that even small quantities of alcohol consumption can increase the risk of atrial fibrillation.  o Data is emerging that there is no safe level of chronic alcohol intake with regard to the development of atrial fibrillation.    o It is best if you can abstain from alcohol to reduce risk of atrial fibrillation recurrence.     Smoking. Active tobacco use can increase the risk of atrial fibrillation.  o Stop smoking  o Talk with your doctor about a plan to help you stop     Lack of exercise or a Sedentary Lifestyle.   o Studies show that 220 minutes of moderate exercise per week (or ~30 minutes per day) can lower your risk of developing atrial fibrillation.    o Furthermore, 30 minutes of exercise per day 5-7 days per week can improve the chances of you staying in normal sinus rhythm after an ablation.      Optimal and timely management targeting ALL of these conditions, can reduce atrial fibrillation, help to keep you in normal sinus rhythm and and improve your quality of life.                  Patient Instructions         Electrophysiology Study and Catheter Ablation: What to Expect at Home  Your Recovery     You had an electrophysiology study for a problem with your heartbeat. You may also have had a catheter ablation to try to correct the problem. You may have swelling, bruising, or a small lump around the site where the catheters went into your body. These should go away in 3 to 4 weeks.  Do not exercise hard or lift anything heavy for a week. You may be able to go back to work and to your normal routine in 1 or 2 days.  This care sheet gives you a general idea about how long it will take for you to recover. But each person recovers at a different pace. Follow the steps below to get better as quickly as possible.  How can you care for yourself at home?  Activity    For 1 week, do not lift anything that would make you strain. This may include heavy grocery bags and milk containers, a  heavy briefcase or backpack, cat litter or dog food bags, a vacuum cleaner, or a child.     For 1 week, do not exercise hard or do any activity that could strain your blood vessels or the site where the catheters went into your body.     Ask your doctor when it is okay to have sex.   Diet    You can eat your normal diet. If your stomach is upset, try bland, low-fat foods like plain rice, broiled  chicken, toast, and yogurt.     Drink plenty of fluids (unless your doctor tells you not to).   Medicines    Your doctor will tell you if and when you can restart your medicines. He or she will also give you instructions about taking any new medicines.     If you take aspirin or some other blood thinner, ask your doctor if and when to start taking it again. Make sure that you understand exactly what your doctor wants you to do.     Ask your doctor if you can take acetaminophen (Tylenol) for pain. Do not take aspirin for 3 days, unless your doctor says it is okay.     Check with your doctor before you take aspirin or anti-inflammatory medicines to reduce pain and swelling. These include ibuprofen (Advil, Motrin) and naproxen (Aleve).     Make sure you know which heart medicines to continue and which ones to stop. Ask your doctor if you aren't sure.   Catheter site care    You can remove your bandages the day after the procedure.     You may shower 24 to 48 hours after the procedure, if your doctor okays it. Pat the incision dry.     Do not soak the catheter site until it is healed. Don't take a bath for 1 week, or until your doctor tells you it is okay.     Watch for bleeding from the site. A small amount of blood (up to the size of a quarter) on the bandage can be normal.     If you are bleeding, lie down and press on the area for 15 minutes to try to make it stop. If the bleeding does not stop, call your doctor or seek immediate medical care.   Follow-up care is a key part of your treatment and  safety. Be sure to make and go to all appointments, and call your doctor if you are having problems. It's also a good idea to know your test results and keep a list of the medicines you take.  When should you call for help?   Call 911  anytime you think you may need emergency care. For example, call if:    You passed out (lost consciousness).     You have symptoms of a heart attack. These may include:  ? Chest pain or pressure, or a strange feeling in the chest.  ? Sweating.  ? Shortness of breath.  ? Nausea or vomiting.  ? Pain, pressure, or a strange feeling in the back, neck, jaw, or upper belly, or in one or both shoulders or arms.  ? Lightheadedness or sudden weakness.  ? A fast or irregular heartbeat.  After you call 911, the operator may tell you to chew 1 adult-strength or 2 to 4 low-dose aspirin. Wait for an ambulance. Do not try to drive yourself.     You have symptoms of a stroke. These may include:  ? Sudden numbness, tingling, weakness, or loss of movement in your face, arm, or leg, especially on only one side of your body.  ? Sudden vision changes.  ? Sudden trouble speaking.  ? Sudden confusion or trouble understanding simple statements.  ? Sudden problems with walking or balance.  ? A sudden, severe headache that is different from past headaches.   Call your doctor now or seek immediate medical care if:    You are bleeding from the area where the catheter was put in  your blood vessel.     You have a fast-growing, painful lump at the catheter site.     You have signs of infection, such as:  ? Increased pain, swelling, warmth, or redness.  ? Red streaks leading from the catheter site.  ? Pus draining from the catheter site.  ? A fever.     Your leg, arm, or hand is painful, looks blue, or feels cold, numb, or tingly.   Watch closely for any changes in your health, and be sure to contact your doctor if you have any problems.  Where can you learn more?  Go to  [employer]  Enter H580 in the search box to learn more about "Electrophysiology Study and Catheter Ablation: What to Expect at Home."  Current as of: August 31, 2020Content Version: 12.9   2006-2021 [employer].   Care instructions adapted under license by your healthcare professional. If you have questions about a medical condition or this instruction, always ask your healthcare professional. [employer] disclaims any warranty or liability for your use of this information.

## 2019-12-21 NOTE — Anesthesia Procedure Notes (Signed)
Airway  Date/Time: 12/21/2019 8:43 AM  Urgency: elective        Final Airway Details  Final airway type: endotracheal airway      Successful airway: ETT - single  Cuffed: yes   Successful intubation technique: direct laryngoscopy  Endotracheal tube insertion site: oral  Blade: Macintosh  Blade size: #3  ETT size (mm): 7.0  Cormack-Lehane Classification (DL): grade IIb - view of arytenoids or posterior of glottis only  Placement verified by: chest auscultation and capnometry   Inital cuff pressure (cm H2O): 30  Measured from: teeth  ETT Depth (cm): 21  Number of attempts at approach: 1  Number of other approaches attempted: 0    General Information and Staff  Patient location during procedure: OR  Performed: anesthesia attending     Indications and Patient Condition  Indications for airway management: anesthesia  Spontaneous Ventilation: absent  Sedation level: anesthesia  Preoxygenated: yes  Patient position: neck neutral  MILS not maintained throughout  Mask difficulty assessment: 1 - vent by mask      For full procedure information, see associated anesthesia event/encounter.

## 2019-12-21 NOTE — Op Note (Addendum)
Cardiology EP Fellow Procedure Note    Attending: Dr. 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 Rachelle Hora as scheduled in 4-6 weekst for 2 hours  -continue uninterrupted AC  -PPI x 2 weeks  -PRN lasix if I/O remains positive this evening  -continue home metoprolol 12.5mg  BID  -consider starting colchicine if patient develops pericarditis  -Groin precaution x 1 week  -overnight observation

## 2019-12-21 NOTE — Anesthesia Post-Procedure Evaluation (Signed)
Anesthesia Post-op Evaluation    Scheduled date of Operation: 12/21/2019    Scheduled Surgeon(s):Joshua D Rachelle Hora, MD  Scheduled Procedure(s):ATRIAL FIBRILLATION, CATHETER PVI    Final Anesthesia Type: general    Assessment  Respiratory Function:      Airway Patency: Excellent      Respiratory Rate: See vitals below      SpO2: See vitals below      Overall Respiratory Assessment: Stable  Cardiovascular Function:      Pulse Rate: See Vitals Below      Blood Pressure: See Vitals Below      Mental Status:      RASS Score: 0 Alert or calm  Temperature: Normothermic  Pain Control: Adequate  Nausea and Vomiting: Absent  Fluids/Hydration Status: Euvolemic    Complications (anesthesia/case associated complications, possible complications, and/or significant issues; as of time of note completion: No apparent complications      Plan  Follow-up care: As per primary team    Post-op Note Status: Complete, patient participated in evaluation, which occurred after recovery from anesthesia but prior to 48 hours from end of case                 Recent Pre-op and Post-op Vital Signs  Vitals:    12/21/19 0644   BP: (!) 185/82   Pulse: (!) 59   Resp: 14   Temp: 36.1 C (97 F)   TempSrc: Temporal   SpO2: 99%     Last Vital Signs Out of Room to Anesthesia Stop  Vitals Value Taken Time   Pulse 80 12/21/19 1544   Resp 14 12/21/19 1544   SpO2 100 % 12/21/19 1544   BP 145/67 12/21/19 1538   Arterial Line BP (mmHg)      Arterial Line MAP (mmHg)     Arterial Line 2 BP (mmHg)     Arterial Line 2 MAP (mmHg)     Temp     Temp src     Vitals shown include unvalidated device data.    Case Tracking Events:  Event Time In   In Facility [id number]   Anesthesia Start 0820   Anesthesia Ready 0901   In Room 0825   Airway-Start 0843   Airway-Stop 0843   Procedure Start 0932   Procedure Finish 1513   Extubation 1517   Out of Room 1532

## 2019-12-21 NOTE — Nursing Note (Signed)
Handoff Note from EP Lab or Holding Room to Inpatient Unit   Patient Debbie Harper had the following procedure performed: EP Ablation   Provider contact for urgent issues related to procedure: for outpatients coming in for EP lab procedure, require planned observation overnight:  Cardiology Procedural Service  [phone]  Most recent vital signs were reviewed verbally during report. For trend in vitals, refer to scanned GE report.   Access site #1  during procedure was: right groin Vein  Access site #2 during procedure was: left groin Vein    Access Closure:  Closure method was Vascade with hemostasis start time at 1510 See [facility] provider orders for closure or bedrest end time 1710  Medications Administered During Procedure: Refer to Scanned GE Procedure Log for full detail of times when medications were administered  Sedation type: General anaesthesia  Contrast Given: No  Antiemetic administered: Yes: Zofran 4mg  IV  Anticoagulants administered: Yes Heparin  Antibiotics administered: No   Intake/Output  IVF volume: 2040 ml  Indwelling Foley catheter with output volume: 1440 ml        Refer to Scanned Procedure Record for further detail. The record can be accessed by opening Chart Review, selecting Scan Clin, then selecting the document type "procedure" to access the procedure document associated with this date.

## 2019-12-22 LAB — MAGNESIUM, SERUM / PLASMA: Magnesium, Serum / Plasma: 1.9 mg/dL (ref 1.6–2.6)

## 2019-12-22 LAB — BASIC METABOLIC PANEL (NA, K,
Anion Gap: 6 (ref 4–14)
Calcium, total, Serum / Plasma: 8.5 mg/dL (ref 8.4–10.5)
Carbon Dioxide, Total: 28 mmol/L (ref 22–29)
Chloride, Serum / Plasma: 109 mmol/L (ref 101–110)
Creatinine: 0.65 mg/dL (ref 0.55–1.02)
Glucose, non-fasting: 99 mg/dL (ref 70–199)
Potassium, Serum / Plasma: 3.4 mmol/L — ABNORMAL LOW (ref 3.5–5.0)
Sodium, Serum / Plasma: 143 mmol/L (ref 135–145)
Urea Nitrogen, Serum / Plasma: 8 mg/dL (ref 7–25)
eGFR - high estimate: 109 mL/min (ref >59)
eGFR - low estimate: 94 mL/min (ref >59)

## 2019-12-22 MED ORDER — LANSOPRAZOLE 30 MG CAPSULE,DELAYED RELEASE
30 | ORAL_CAPSULE | Freq: Every day | ORAL | 0 refills | Status: AC
Start: 2019-12-22 — End: ?

## 2019-12-22 MED ORDER — MENTHOL 5.8 MG LOZENGES
5.8 | Freq: Three times a day (TID) | Status: DC | PRN
Start: 2019-12-22 — End: 2019-12-22

## 2019-12-22 MED ORDER — POTASSIUM CHLORIDE ER 10 MEQ TABLET,EXTENDED RELEASE (~~LOC~~ WRAPPER)
10 | Freq: Once | ORAL | Status: AC
Start: 2019-12-22 — End: 2019-12-22
  Administered 2019-12-22: 16:00:00 40 meq via ORAL

## 2019-12-22 MED FILL — POTASSIUM CHLORIDE ER 10 MEQ TABLET,EXTENDED RELEASE: 10 mEq | ORAL | Qty: 4

## 2019-12-22 MED FILL — ELIQUIS 5 MG TABLET: 5 mg | ORAL | Qty: 1

## 2019-12-22 MED FILL — METOPROLOL TARTRATE 25 MG TABLET: 25 mg | ORAL | Qty: 1

## 2019-12-22 MED FILL — ACETAMINOPHEN 500 MG TABLET: 500 mg | ORAL | Qty: 1

## 2019-12-22 MED FILL — LANSOPRAZOLE 30 MG CAPSULE,DELAYED RELEASE: 30 mg | ORAL | Qty: 1

## 2019-12-22 MED FILL — ACETAMINOPHEN 325 MG TABLET: 325 mg | ORAL | Qty: 2

## 2019-12-22 MED FILL — ONDANSETRON HCL (PF) 4 MG/2 ML INJECTION SOLUTION: 4 mg/2 mL | INTRAMUSCULAR | Qty: 2

## 2019-12-22 NOTE — Plan of Care (Signed)
Problem: Procedural Instructions - Cardiac Catheterization / EP - Adult  Goal: Knowledge of pre - procedural instructions  Outcome: Progress within 12 hours  Goal: Knowledge of post - procedural instructions  Outcome: Progress within 12 hours     Problem: Discharge Planning - Adult  Goal: Knowledge of and participation in plan of care  Outcome: Progress within 12 hours

## 2019-12-22 NOTE — Progress Notes (Signed)
Brief EP note  S: POD#1 after PVI for PAF  One brief episode of chest discomfort that resolved   Otherwise complains of sore throat in light of general anesthesia/intubation  Denies chest pain, shortness of breath, palpitation etc  Ambulating with no issues    O: vitals and labs were reveiwed  NAD  RRR, S1, S2 no m/r/g  CTAB  Soft, nontender, nondistended  Bilateral groin- no hematoma or ecchymosis, bruit  Distal pulses intact b/l   Warm extremities    Tele with no arrhythmia    A/p: recovering well following PVI yesterday with no recurence  -PPI x2 weeks  -cont home dose metoprolol 12/5mg  BID  -continue uninterrupted AC  -groin precaution 1 week  -follow up with Dr. Rachelle Hora as scheduled

## 2019-12-24 ENCOUNTER — Other Ambulatory Visit: Payer: Self-pay | Admitting: Nurse Practitioner

## 2019-12-24 DIAGNOSIS — E1169 Type 2 diabetes mellitus with other specified complication: Secondary | ICD-10-CM

## 2019-12-24 MED FILL — metFORMIN HCL 1000 MG TABS: 1000 | 90 days supply | Qty: 180 | Fill #0

## 2019-12-24 MED FILL — EZETIMIBE 10 MG TABS: 10 | 90 days supply | Qty: 90 | Fill #0

## 2019-12-25 LAB — ECG 12-LEAD
Atrial Rate: 63 {beats}/min
Calculated P Axis: 70 degrees
Calculated R Axis: 82 degrees
Calculated T Axis: 3 degrees
P-R Interval: 176 ms
QRS Duration: 122 ms
QT Interval: 460 ms
QTcb: 470 ms
Ventricular Rate: 63 {beats}/min

## 2019-12-26 LAB — ECG 12-LEAD
Atrial Rate: 75 {beats}/min
Atrial Rate: 75 {beats}/min
Calculated P Axis: 71 degrees
Calculated P Axis: 74 degrees
Calculated R Axis: 35 degrees
Calculated R Axis: 39 degrees
Calculated T Axis: 0 degrees
Calculated T Axis: 36 degrees
P-R Interval: 134 ms
P-R Interval: 136 ms
QRS Duration: 126 ms
QRS Duration: 132 ms
QT Interval: 452 ms
QT Interval: 472 ms
QTcb: 504 ms
QTcb: 527 ms
Ventricular Rate: 75 {beats}/min
Ventricular Rate: 75 {beats}/min

## 2019-12-31 LAB — POCT ACTIVATED CLOT TIME PLUS
POCT ACT Plus: 178 s — ABNORMAL HIGH (ref 81–125)
POCT ACT Plus: 335 s — ABNORMAL HIGH (ref 81–125)
POCT ACT Plus: 344 s — ABNORMAL HIGH (ref 81–125)
POCT ACT Plus: 349 s — ABNORMAL HIGH (ref 81–125)
POCT ACT Plus: 352 s — ABNORMAL HIGH (ref 81–125)
POCT ACT Plus: 352 s — ABNORMAL HIGH (ref 81–125)
POCT ACT Plus: 352 s — ABNORMAL HIGH (ref 81–125)
POCT ACT Plus: 352 s — ABNORMAL HIGH (ref 81–125)
POCT ACT Plus: 355 s — ABNORMAL HIGH (ref 81–125)
POCT ACT Plus: 357 s — ABNORMAL HIGH (ref 81–125)
POCT ACT Plus: 357 s — ABNORMAL HIGH (ref 81–125)
POCT ACT Plus: 360 s — ABNORMAL HIGH (ref 81–125)
POCT ACT Plus: 360 s — ABNORMAL HIGH (ref 81–125)
POCT ACT Plus: 364 s — ABNORMAL HIGH (ref 81–125)
POCT ACT Plus: 366 s — ABNORMAL HIGH (ref 81–125)

## 2020-01-04 ENCOUNTER — Ambulatory Visit: Admit: 2020-01-04 | Payer: BLUE CROSS/BLUE SHIELD | Attending: Family | Primary: Physician

## 2020-01-04 DIAGNOSIS — G4700 Insomnia, unspecified: Secondary | ICD-10-CM

## 2020-01-04 DIAGNOSIS — G4733 Obstructive sleep apnea (adult) (pediatric): Secondary | ICD-10-CM

## 2020-01-04 DIAGNOSIS — R05 Cough: Secondary | ICD-10-CM

## 2020-01-04 DIAGNOSIS — I480 Paroxysmal atrial fibrillation: Secondary | ICD-10-CM

## 2020-01-04 DIAGNOSIS — R5383 Other fatigue: Secondary | ICD-10-CM

## 2020-01-04 NOTE — Progress Notes (Signed)
This is an independent service.  The available consultant for this service is Teena Irani. Claman, MD.       I performed this evaluation using real-time telehealth tools, including a live video Zoom connection between my location and the patient's location. Prior to initiating, the patient consented to perform this evaluation using telehealth tools.     Thank you for requesting Sleep Consultation for Debbie Harper who is a 64 y.o. female seen today for suspected Obstructive Sleep Apnea.      Reason for Referral? poor sleep, nocturnal atrial fibrillation  Dr. Rachelle Hora thought she might have sleep apnea issues.     Patient reports that sometimes she wakes up in middle of the night, coughing.   If lying down and has atrial fibrillation has shortness of breath, and would sit up which makes breathing better.  Patient reports she had Ablation on 12/21/2019  No episode of atrial fib since she had procedure, although this morning she has palpitation sensation    Debbie Harper typically sleeps 6 hours per night and feels unrested in the morning sometimes. She goes to bed around 11 PM. She describes difficulty falling asleep, generally falls asleep after 60-90 minutes.She takes half valium to help her relax which helps her fall asleep, every 3 weeks. Every night she would sleep for 2 hrs, wake up then goes back to sleep for another 2 hours then fall back to sleep again.   She reports waking 0-1 times during the night to void, before finally waking around 5-7 AM on weekdays and 630 - 730 AM on weekends.  She does not feel refreshed and describes occasional daytime sleepiness. Her Epworth Sleepiness Scale today is 5 (normal less than 10).     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 She reports dozing off during the daytime. She occasionally  feels drowsy in the evening.  She does not drink caffeinated beverages. She rarely feels sleepy behind the wheel, denies any sleepiness related motor vehicle  accidents.    She is not aware of snoring, denies witnessed apneas and has occasional nocturnal choking. She rarely wakes up with a dry mouth. She denies cataplexy, sleep paralysis, hypnagogic or hypnopompic hallucinations. Shedenies vivid dreams or nightmares. She denies any history of walking, talking, yelling or eating during sleep. She is not aware of any unusual movements during the night. She denies discomfort or an urge to move her legs specific to the evenings.    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 14 capsule 0    metoprolol tartrate (LOPRESSOR) 25 mg tablet Take 12.5 mg by mouth Twice a day          No current facility-administered medications on file prior to visit.     Allergies/Contraindications   Allergen Reactions    Sulfamethoxazole-Trimethoprim Itching     Insomnia     Past Surgical History:   Procedure Laterality Date    CLAVICLE SURGERY  2004    HYSTERECTOMY  2009    left shoulder       soft tissue repair      She  reports that she has never smoked. She has never used smokeless tobacco. She reports current alcohol  use. She reports that she does not use drugs.    Review of Systems    General: No weight changes, fevers, or chills.  ENT:  Rare waking up with a dry mouth.  No nasal congestion or post-nasal drip.    Respiratory:  Positive SOB, denies orthopnea, dyspnea on exertion, cough or wheeze  Cardiac:  Positive palpitations, denies chest pain.  GI: No nausea, vomiting, GERD or abdominal pain.   GU:  Nocturia x 0-1.   Endocrine: No history of hypothyroidism or diabetes.   Neurologic: Rare morning headaches, leg restlessness or leg kicking.  Musculoskeletal: No myalgias or arthralgias that awaken the patient at night.   Psych:  Denies depression, irritability or anxiety. Denies suicidal ideation.     The remainder of systems were reviewed and are negative.    PHYSICAL EXAMINATION:  Ht 162.6 cm (5\' 4" )   Wt 63 kg (139 lb)   BMI 23.86 kg/m   PE limited since this is a Telemed visit  Constitutional: appears well-developed and well-nourished. Not in acute distress.   HEENT: Normocephalic and atraumatic. EOM are normal. No external septal deviation, no nasal valve collapse. Low lyingsoft palate, Mallampati class III, oral pharynx is hard to visualize, Scalloping along lateral edges of tongue.  Neck: Normal range of motion.   Pulmonary/Chest: Effort normal. No stridor.   Neurological: Alert and oriented to person, place, and time.   Psychiatric: Normal speech and affect.     Data:  I personally reviewed and interpreted the Epworth Sleepiness Scale (ESS):  5/24 which is normal.    I personally reviewed the following labs: CBC shows elevated MCV and low platelets, low potassium.   Key elements of latest CBC values... Please see Chart Review for additional result details.  Component   Ref Range & Units 12/15/19    WBC   3.9 - 10.1 K/mm3 5.2    RBC   3.9 - 5.3 M/mm3 4.0    Hgb   11.2 - 15.7 g/dL 12/17/19    Hematocrit   99.8 - 44.9 % 38.9    MCV   79.4 - 94.8 fL 96.3High     MCH   25.6 - 32.2 pg 32.2    MCHC   32.2 - 35.5 g/dL 33.8    Red  Cell Distribution Width   11.7 - 14.4 % 12.3    Platelets   182 - 369 K/mm3 148Low     Mean Platelet Volume   9.4 - 12.3 fl 10.3      No results found for: FERR, FERREXL, FERREXQ, FERREXT  No results  found for: TSH  No results found for: TT4, FT4, FT4DIAL, FT4EXL, T4EXT    Lab Results   Component Value Date    NA 143 12/22/2019    K 3.4 (L) 12/22/2019    CL 109 12/22/2019    CO2 28 12/22/2019    BUN 8 12/22/2019    CREAT 0.65 12/22/2019    GLU 99 12/22/2019     ECG 12/21/2019 post procedure  IMPRESSION:   Abnormal record  Normal sinus rhythm  Right bundle branch block  When compared with ECG of 21-Dec-2019 16:38,  No significant change     Sleep Study  Weight: 63 kg (139 lb)  BMI (Calculated): 23.9      ASSESSMENT AND PLAN:    1. Obstructive Sleep Apnea is clinically suspected due to her history of nocturnal coughing, fatigue and nocturnal episodes of atrial fibrillation. If apnea is severe (AHI > 30), then she would be at increased risk of morbidity, including excessive daytime sleepiness, hypertension, and cardiovascular disease. Diagnosis and treatment have been discussed in detail with the patient including weight loss, avoidance of alcohol near bedtime, side sleeping, CPAP, oral appliances, and ENT surgery.  I have ordered a sleep study for diagnosis, and a CPAP titration will be performed during the test if there is evidence of significant obstructive sleep apnea.  I will follow-up with her after the study for results and treatment recommendations.    2. Disorder initiating and maintaining sleep is currently present.  Cognitive behavioral interventions focusing on sleep hygiene were discussed in detail as important treatments for chronic insomnia.    3. Paroxysmal atrial fibrillation, treated and followed up by Dr. Harriett Rush.    4. History of Mobitz type II second degree AV block with symptomatic bradycardia, status post pacemaker:    5.  Restless legs, periodic limb movements in sleep, narcolepsy are  doubtful diagnosis at this time.    6. The patient has been advised never to drive if sleepy or drowsy.      Electronically signed and dated by: Saundra Shelling, 01/04/2020    Supervising physician:  Adalberto Ill, MD

## 2020-01-04 NOTE — Patient Instructions (Signed)
Our office will call you to schedule your sleep study. If you don't hear from us after a week, please call [phone] to schedule.    You will be contacted with results 4 weeks after the sleep study    If you have a medical concern regarding your treatment, please contact me directly  through the [facility] My Chart portal. Please activate your account with the code provided if you haven't done it.    OBSTRUCTIVE SLEEP APNEA    Sleep Apnea means not breathing during sleep.    Obstructive sleep apnea occurs when airway tissue collapses shut and blocks breathing during sleep.  The brain responds to this by waking up enough to re-open the airway.  These interruptions in sleep can cause daytime sleepiness and fatigue. We calculate an Apnea-hypopnea Index (AHI).      Mild apnea:   Stop breathing 5-15 times per hour (AHI between 5 and 15/hr)   Moderate: 15.1 to 30 times per hour (AHI between 15 and 30/hr)   Severe: >30 times per hour (AHI>30/hr)     Weight loss    Best achieved by permanent lifestyle changes    Eating less and exercising more.     Avoid alcohol and sedatives for 3-4 hours before bedtime.     Sleep on your side or stomach, and avoid sleeping on your back.   Apnea is often worse on your back:  Consider side sleeping with either a Snoogle, a c-shaped body pillow recommended for pregnant women that can be ordered off the internet; a Jackson cervical pillow or a thick foam horseshoe-shaped pillow for neck support and a pillow between the knees along with pillows behind the back; or "snore balls" (three whiffle balls or tennis balls placed in a sock and pinned horizontally to a shirt to overlie the shoulder blades and spine--use a safety pin on either side of each ball and align the balls to overlie the shoulder blades on either side and the spine in the middle) with a pillow between the knees as well.     CPAP = Continuous Positive Airway Pressure; APAP= Auto-titrating CPAP   Air is gently blown through a  nasal mask into the airway   Can eliminate 90-100% of both snoring and apnea   Often the first choice for treatment if apnea is moderate to severe.     Oral appliances   Plastic appliances hold the lower jaw forward, which pulls the tongue forward.   Made by a trained dentist.   Can be 80% effective for mild sleep apnea.  Approximately 65% effective for moderate sleep apnea.    Provent (for OSA and by prescription only)/Theravent (OTC for snoring)   Partial one-way expiratory valves that partially block exhalation and sit at the bottom of the nose and are disposable. Begin night with mouth open, bypassing the device, and mouth will close once you enter sleep.     ENT surgery   Can be performed in nose, throat (uvula), jaw, or tongue   Success rates vary, but success may be as low as 40% chance of curing apnea.   Cannot guarantee success in advance.    For snoring alone (if sleep study does not show apnea)   Weight loss, avoid alcohol, and sleep on your side   Oral appliances (self-pay)   ENT:  Laser surgery or radio frequency (Somnoplasty) surgery on uvula has a 70-80% success rate for reducing snoring (also self-pay).

## 2020-01-07 MED FILL — levETIRAcetam 500 MG TABS: 500 | 90 days supply | Qty: 180 | Fill #1

## 2020-01-11 ENCOUNTER — Telehealth: Payer: Self-pay

## 2020-01-11 NOTE — Telephone Encounter (Signed)
Incoming fax received from [facility] (active health management) indicating patient has no evidence for an eye exam within the past 12 months.  Per verbal from Lauree Chandler, NP call patient and see if she is planning on getting an eye exam.  Spoke with patient, patient states she is recovering from a hernia surgery and will schedule an eye exam in the near future.  Patient will call back if she needs a referral.

## 2020-02-02 ENCOUNTER — Ambulatory Visit: Admit: 2020-02-03 | Discharge: 2020-03-10 | Payer: BLUE CROSS/BLUE SHIELD | Attending: Physician | Primary: Physician

## 2020-02-02 DIAGNOSIS — I441 Atrioventricular block, second degree: Secondary | ICD-10-CM

## 2020-02-02 DIAGNOSIS — I480 Paroxysmal atrial fibrillation: Secondary | ICD-10-CM

## 2020-02-02 DIAGNOSIS — Z950 Presence of cardiac pacemaker: Secondary | ICD-10-CM

## 2020-02-02 NOTE — Progress Notes (Signed)
I performed this consultation using real-time Telehealth tools, including a live video connection between my location and the patient's location. Prior to initiating the consultation, I obtained informed verbal consent to perform this consultation using Telehealth tools and answered all the questions about the Telehealth interaction.    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 as a telehealth video visit in Cardiac Electrophysiology today for further evaluation of symptomatic bradycardia, status post dual-chamber pacemaker, atrial fibrillation, and NSVT.    Ms. Counts is a 64 y.o. 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    Allow me to review her history for the electronic MEDICAL RECORD NUMBER  Exertional fatigue and mild shortness of breath started on 08/23/2015 and persisted thereafter, were also sometimes associated with lightheadedness.  She had previously been very active, feeding and riding her horse nearly every day.  Evaluation was notable for a baseline right bundle branch block, and periods of symptomatic 2:1 AV block while reaching peak exercise on a treadmill stress test (see details below).  Holter monitoring also reportedly showed blocked PAC's with resultant bradycardia, though to my review blocked atrial activity was not typically premature (see details below). 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     A dual-chamber device was implanted without complication on 11/29/3417.  Thereafter, she noticed significant improvement in her symptoms, but had one episode very similar to her pre-pacemaker symptoms on 11/18/2015 and several  milder episodes afterwards aborted by resting.  Device function was normal -- at follow-up 11/2015, she paced 5% in the RA and 1% in the RV.  She underwent breast augmentation surgery on 04/18/2016.     At her visit in April 2018, she reported having a "wide complex tachycardia" for 5 minutes (up to 115 bpm) after a colonoscopy 06/2016.  She also started having episodes of palpitations in February and specifically recalled symptomatic episodes on 2/12 and 2/24 -- the latter correlated with a >1 hour episode of atrial fibrillation recorded by the device.  She was started on aspirin 81 mg daily and recommended to take metoprolol 12.5 mg twice daily, though opted not to take it.     At her visit in 03/2017, she reported shortness of breath with exertion (such as walking up stairs) and wondered if there could be a functional issue with her heart -- she actually contacted me prior to the visit to request that an echocardiogram be scheduled the same day.  The echocardiogram showed normal LV function and no hemodynamically significant valvular disease.  Device interrogation showed 62 minutes of atrial fibrillation between April and October 2018, with the longest episode lasting 11:30 minutes on 01/01/2017.  Rate histograms showed a shift to the left, with atrial sensing or pacing in the 60s more than 50% of the time and only rare atrial sensing above 90 bpm.  We agreed to try adding rate-response to her device, and to continue conservative management of the rare atrial fibrillation.  The rate-response was later disabled because she was feeling "jittery."   Device check in 06/2017 showed 0.1% atrial high rate detection (10 episodes), with the longest 53 minutes.  Device check in 10/2017 showed 0.1% atrial high rate detection, with the longest episode 35 minutes.  There was also a 20 beat episode of NSVT recorded on 09/17/2017, correlating with an episode of lightheadedness (see my note dated 11/15/2017).  I recommended she  try taking metoprolol 12.5 mg twice daily and consider cardiac MRI and/or PET (particularly given the paroxysmal AV block and atrial fibrillation).   Cardiac MRI in 12/2017 revealed mid-wall and subepicardial late gadolinium enhancement in the lateral wall at the level of the base.  A follow-up PET CT scan showed no abnormal FDG uptake and no resting perfusion defects.   She contacted me 03/19/2018 to report some chest pain "off and on" starting the day before and sent remote device transmissions.   At her visit in 04/2018, she was feeling well overall. She had occasional symptoms that corresponded to atrial fibrillation, though she did not feel it was impacting her life significantly. She denied any additional lightheadedness. She preferred to continue metoprolol for atrial fibrillation.    She had a follow-up echocardiogram in early May 2020 showing normal LV function (details below).  Device interrogations continued to show an overall low burden of atrial fibrillation.   At follow-up 11/18/2018, she was feeing "pretty good". She had some sensation of an irregular heart beat at night affecting sleep. We increased her metoprolol evening dose. Device interrogation showed rare brief NSVT.    On 04/14/2019, she presented to the ED at [facility] with episodes of low blood pressure of 28N systolic at home over the prior several days and some "off or blurry" vision. She reportedly had no focal neurologic deficits, and per records from her hospitalization, it was not felt symptoms were consistent with stroke. She was treated with IV fluids and discharged from ED.    Remote device interrogation 04/16/2019 showed normal function, with 106 episodes of atrial fibrillation since 02/10/2019 (burden 1.7%).  The longest formally measured episode was 3 hours 40 min on 03/11/2019, though based on the log I suspect she was continuously in atrial fibrillation from about midnight until 9 am on 04/13/2019; average ventricular  rates were in the 80s.   At follow-up 04/21/2019, she was  "hanging in there."  During the recent atrial fibrillation episode, she felt an irregular heart rate, but not a "super high" pulse.  She did not take her metoprolol, being concerned about lowering her blood pressure further. She continued to have some shortness of breath with exertion, particularly at altitude, forcing her to rest more than she felt she should have to.  We recalled that previously enabling the rate-response feature on her pacemaker in 03/2017 made her feel anxious and jittery.  We reviewed rhythm control options, but she preferred to continue low-dose metoprolol to try to prevent episodes.   Device interrogation 10/12/2019 showed 54 high rate atrial episodes, with burden 3% -- the longest was 08/01/2019 for 6 hours and 24 minutes.  Histograms show predominant V rates 60-140 bpm with minimal counts in bins to 180 bpm during AF.  She contacted me with questions about 10/10/2019 after 11pm: episode logs show atrial fibrillation from about 1 am to about 6 am on 10/11/2019.   At her visit 10/20/2019, she reported fatigue and low blood pressure that prompted her to decrease her metoprolol back to 12.5 mg twice daily.  She got the first dose of Pfizer Covid-19 vaccine in 06/2019 and had atrial fibrillation that night -- she planned to defer the second shot for the time  being. After discussion, she decided to proceed with ablation for the atrial fibrillation.   The ablation procedure was performed 12/21/2019.  All 4 PVs were isolated.  She was discharged the following day.     Remote transmission from her device 02/01/2020 recorded 2 episodes of non-sustained AF since the ablation, on 01/23/2020 for 9 seconds and on 01/26/2020 for 13 seconds -- however, EGMs appeared consistent with brief oversensing.    Today, Debbie Harper reports feeling "OK" overall, with days when she feels very "tired" and "out of it."  She has had some palpitations that didn't feel like  atrial fibrillation but more just "straight palpitations."      She denies current chest discomfort (with none over the past week and a half), orthopnea, paroxysmal nocturnal dyspnea, lower extremity edema, lightheadedness, presyncope, or syncope.    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    History of motion sickness     IBS (irritable bowel syndrome) 10/18/2015    IBS (irritable bowel syndrome)     Mobitz type 2 second degree atrioventricular block 10/18/2015    s/p pacemaker     Pacemaker 11/29/2015    RBBB 10/18/2015    Thyroid nodule 10/18/2015    since 08-26-01       CURRENT MEDICATIONS     Your Medications at the End of This Visit       Disp Refills Start End    apixaban (ELIQUIS) 5 mg tablet 180 tablet 3 10/20/2019     Sig - Route: Take 1 tablet (5 mg total) by mouth 2 (two) times daily - Oral    lansoprazole (PREVACID) 30 mg capsule 14 capsule 0 12/23/2019     Sig - Route: Take 1 capsule (30 mg total) by mouth daily Please take medication for two weeks then stop. - Oral    Renewals     Renewal requests to authorizing provider Webb Silversmith Nori Riis Destruel, NP) <b>prohibited</b>          metoprolol tartrate (LOPRESSOR) 25 mg tablet        Sig - Route: Take 12.5 mg by mouth Twice a day    - Oral    Class: Historical Med          ALLERGIES     Allergies as of 02/01/2020  Review status set to Review Complete by Carol Ada at 1:34 PM      Severity Noted Reaction Type Reactions    Sulfamethoxazole-trimethoprim Not Specified 10/18/2015    Itching    Insomnia          SOCIAL HISTORY     She reports that she has never smoked. She has never used smokeless tobacco. She reports current alcohol use. She reports that she does not use drugs.    She is divorced.  She has 3 children.  She lives in [place], [place].  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    She has 2 horses (a Engineer, water and a [place], ages 87 and 109).   She also wakeboards in the summer.    FAMILY HISTORY     Her father underwent CABG in his late 55's and has cardiomyopathy.  He was a heavy smoker. He passed away in early 26-Aug-2018.    Her mother had a heart attack around age 10.  She died after complications from a head  injury in June 2016 after being knocked down by a horse.  Her grandfather had colon cancer.    REVIEW OF SYSTEMS     Gastrointestinal:  + IBS with cramping and gas.  No blood in the stool.  + polyps on colonoscopy in 2015.    PHYSICAL EXAMINATION     Constitutional: Patient appears well-developed and well-nourished. Pleasant and appropriately interactive.  Head: Normocephalic and atraumatic.   Neck: Normal range of motion.   Pulmonary/Chest: Effort normal. No respiratory distress. No cough.  Neurological: Alert and oriented to person, place, and time.   Psychiatric: Normal mood and affect. Behavior is normal. Judgment and thought content normal.   Skin: No visible rash.     DATA     I reviewed prior 12-lead EKGs:  - 12/21/2019: sinus rhythm with right bundle branch block at 75 bpm  - 04/02/2017: competing sinus rhythm and atrial pacing at 60 bpm with intact AV conduction and right bundle branch block (QRSd 124 msec).  - 10/02/2016: atrial pacing at 61 bpm with intact AV conduction and right bundle branch block  - 11/29/2015: normal sinus rhythm with right bundle branch block at 64 bpm.  - 10/18/2015: sinus bradycardia at 57 bpm with right bundle branch block  - 08/29/2015: sinus bradycardia at 57 bpm with right bundle branch block.    Remote transmission from her device 02/01/2020 recorded 2 episodes of non-sustained AF since the ablation, on 01/23/2020 for 9 seconds and on 01/26/2020 for 13 seconds, but EGMs appeared consistent with brief oversensing and not true atrial fibrillation.    Remote pacemaker interrogation 10/12/2019 showed 54 high rate atrial episodes, with 46% A-pacing and 4% V-pacing.  AT/AF burden was 3% -- the longest episode was 08/01/2019 for 6  hours and 24 minutes (though other shorter episodes may have been part of one longer episode, including on 10/11/2019).  Histograms show predominant V rates 60-140 bpm with minimal counts in bins to 180 bpm during AF.  Episodes from 09/2019 include:      Remote pacemaker interrogation 04/16/2019 showed normal overall function, with 48% atrial pacing and 2.9% ventricular pacing.  Total atrial fibrillation burden was 1.7% since 01/2019, with longest episode 3 hours 40 minutes on 03/11/2019.  There were multiple episodes 10/18-10/19/2020.  The longest episode within that time period measured formally was 1 h 20 min, though based on the log I suspect she was continuously in atrial fibrillation from ~midnight until ~9am on 04/13/2019.    Remote pacemaker interrogation 11/18/2018 (see separate full report in [facility]):    - She paces 45.4% in the RA (stable) and 4.6% in the RV (stable).    - Atrial fibrillation burden has been 0.9%, the longest episode lasting 5 hours on 11/07/2018 with max V-rate of 130 bpm (max V-rate during all atrial fibrillation episodes 133 bpm)  - No NSVT recorded  - Many longer episodes were overnight      I reviewed the report and tracings from a 24-hour Holter monitor 08/31/2015 [facility]):  - Average HR 64.  Minimum 38.  Maximum 97.  Predominant rhythm sinus.  - Single PVC's occasional; no runs.  - Frequent blocked PACs with resultant bradycardia.  No SVT or atrial fibrillation.    - Occasional shortness of breath during periods of bradycardia with blocked PACs.  - On my review of tracings, several strips appear more consistent with sinus rhythm with 2:1 block than with blocked PAC's based on timing (though P-wave morphology is subtly different  with blocked beats).  On tracings with both 2:1 and 1:1 conduction, the P-wave CL remains constant and ventricular rate exactly 1/2 during 2:1 block.  When transitioning from 1:1 to 2:1 block, the first blocked P-wave comes slightly early, but possibly  due to increase in sinus rate.  - Less frequently (particularly 2:26am), bradycardia appears due to blocked PAC's in bigeminy.  - One period of 3:2 conduction (9:28am) without clear PR prolongation    I reviewed the reports of the following tests:   Echocardiogram 10/31/2018 ([place]):       Cardiac PET 02/17/2018:  1.   Normal perfusion in the left ventricle without myocardial uptake of FDG. Findings are suggestive of no active myocardial inflammatory process or scar.  2.    Normal left ventricular wall motion with a calculated left ventricular ejection fraction of >65% on rest perfusion images.     Cardiac MRI 12/31/2017:  1. Mid wall and subepicardial late gadolinium enhancement in the lateral wall at the level of the base which can be seen with myocarditis or other inflammatory cardiomyopathy. Remaining myocardium demonstrates no evidence of scar.  2. Normal left ventricular function (ejection fraction 73%).     Echocardiogram 04/02/2017:  The patient's blood pressure was 154 mmHg/70 mmHg during the study. The heart rate during the study was 73. Color Flow Doppler was utilized for this exam. Spectral Doppler was utilized for this exam.  1. The left ventricular volume is normal. LV function is hyperdynamic. LV ejection fraction is estimated to be 70 to 75%.  2. The right ventricular volume is normal. Right ventricular function is normal.  3. Left atrial size is normal. Right atrial size is normal.  4. There is no hemodynamically significant valvular disease.  5. There is no Doppler evidence of LV diastolic dysfunction.  6. The RV systolic pressure is estimated to be 23 mmHg based on a right atrial pressure of 3 mmHg.  7. No pericardial effusion noted. The inferior vena cava is less than 21 mm in diameter and collapses with inspiration consistent with a right atrial pressure of 3 mmHg.  8. Aortic root dimension is normal.  - Compared to the previous study on 09/23/15, the prior study is a stress  echo.     Exercise stress echocardiogram 09/23/2015 [facility]):  - Resting EKG sinus rhythm with right bundle branch block   - Resting echocardiogram: Normal LV size, wall thickness, and function, EF 60-65%.  Atria normal size.  RV normal.  Mild anterior leaflet MVP and mild MR.  No other significant valvular pathology.  No pericardial effusion.  - Walked 8 min 11 sec on [place], with peak HR 118 bpm early but development of 2:1 AV block at peak exercise with HR in the 60s and associated shortness of breath (2:1 block versus atrial bigeminy with block).  Returned to 1:1 conduction in recovery.  Episodes of supraventricular bigeminy noted.  - I reviewed the tracings:  - At 2:24, there is a period of 3:2 conduction with constant P-P interval and no clear PR prolongation, that appears to persist through 5:50  - HR then drops from 89 to 74 but P-waves difficult to see  - At 6:50 there appears to be 2:1 block with occasional outflow tract PVC's  - At 7:50 there is suspicion for 2:1 conduction with blocked P-waves in prior T-waves.  - At 15 seconds into recovery there are periods with 1:1 conduction at double the rate of final exercise EKGs (  5:4 and 6:5 conduction, but with little to no change in PR interval and R-R interval around blocked P-wave exactly double that of conducted intervals).  This apparent Mobitz type II second degree AV block becomes more apparent as the rate continues to slow in recovery.  - By 2 minutes into recovery there is clear 2:1 AV block that persists to 3 minutes, though with one episode of 3:2 block that appears slightly more consistent with Wenckebach based on the PR intervals and length of pause  - Stress echocardiogram:  Appropriate increase in contractility of all segments; no evidence of ischemia.     Chest CTA 09/22/2015 [facility]):  - Negative for PE or other explanation for shortness of breath.  - Few liver lesions, probably cysts.    - Ascending and descending aorta normal  caliber     PFTs 09/08/2015:  - FVC, FEV1, FEV1/FVC ratio, and FEF25-75% within normal limits  - TLC within normal limits  - FRC and RV increased  - With bronchodilators, there is significant response.  - Diffusing capacity high.  - Conclusions: Increased diffusing capacity consistent with left heart failure or shunting.  Although flow rates are within normal limits, the overinflation and response to bronchodilators are characteristic of reactive airways ("minimal")    I reviewed laboratory evaluation from [facility] dated 04/14/2019:  Na 141, K 3.8, Cl 105, CO2 32, BUN 17, Cr 0.6, Glu 100  LFTs normal (alk phos low at 44)  WBC 3.7, Hb 13.5, Plt 143  TnI <0.012  nT-BNP 238  U/A negative      ASSESSMENT AND RECOMMENDATIONS     In summary, Ms. Mckiver is a 64 y.o. 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  She was subsequently diagnosed with paroxysmal atrial fibrillation, as well as occasional NSVT, and underwent ablation for atrial fibrillation 12/21/2019.  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    We focused our evaluation and discussion today on the following problems:    1) Atrial fibrillation:  - She had a prior history of relatively infrequent palpitations and was been diagnosed with paroxysmal atrial fibrillation via device interrogation.  The burden continued to slowly increase, and she was symptomatic with longer episodes.  She decided to proceed with catheter ablation, performed 12/21/2019 -- device interrogation has shown no episodes of true atrial fibrillation since, and she has not had palpitations similar to what she experienced with AF.    - We discussed the findings of her EP study  and ablation procedure.  I reminded her that early recurrences of atrial arrhythmias, in the first 3 months after left atrial ablation, are often the result of ongoing inflammation and irritation from the procedure itself and do not necessarily portend worse prognosis for long term arrhythmia suppression.  I reassured her that recent symptoms did not appear to have an arrhythmic etiology.    We agreed on the following plan:  - We will continue to monitor arrhythmia burden via the implanted device  - She will continue metoprolol    2) Stroke prevention:  - Her CHA2DS2-VASc score is 1, for gender only.  This corresponds to a yearly stroke risk of 1.5% with no therapy, 1.2% with aspirin alone, and  0.3-0.5% on therapeutic anticoagulation.  - Given the recent ablation, I recommended she stay on apixaban until 03/22/2020 -- 3 months post-procedure.  Thereafter, it is reasonable to stop anticoagulation, which she would also prefer given her enjoyment riding horses.    3) Mobitz type II second degree AV block with symptomatic bradycardia, status post pacemaker:  - The device is functioning normally based on remote interrogation.  Her ventricular pacing burden has remained low overall.  - She will continue routine pacemaker checks remotely and in the clinic.    4) Possible sinus node dysfunction:  - She may have some degree of chronotropic incompetence that contributes to exertional dyspnea, but felt poorly with rate-response enabled.  We could consider another trial of rate-response with less aggressive settings in the future.    5) NSVT:  - She had a symptomatic, 20-beat run of NSVT in 08/2017 associated with significant lightheadedness.  We initiated the metoprolol after that, and she has since had minimal brief NSVT.  - Given the combination of AV block, sinus node dysfunction, paroxysmal atrial fibrillation, and NSVT, I suggested further evaluation for inflammatory cardiomyopathy such as sarcoid.  MRI showed evidence of  basal-lateral mid-myocardial and epicardial LGE (with preserved LV function), and subsequent PET showed no perfusion defects or evidence of inflammation.  The etiology of the LGE is not clear -- perhaps she had a prior episode of myocarditis.    - The combination of the LGE and NSVT is obviously concerning, though she has normal LV function, no sustained ventricular arrhythmias, and no syncope.  More recent lightheadedness was clearly associated with an episode of atrial fibrillation pre-ablation. Since starting low dose metoprolol she has had minimal NSVT. I do not think there is an indication for ICD upgrade at this time.       It is a pleasure participating in Ms. Rollins's care. I spent a total of 45 minutes on her care on the day of her visit excluding time spent related to any billed procedures. This time includes face-to-face time with the patient as well as time spent documenting in the medical record, reviewing the patient's records and tests, obtaining history, placing orders, communicating with other healthcare professionals, counseling the patient, family, or caregiver, and/or care coordination for the diagnoses above.    Please do not hesitate to contact me with any questions or concerns.    Estrellita Ludwig. Manus Rudd, MD, [facility], [facility]  Associate Professor of Clinical Medicine  Division of Cardiology  Cardiac Electrophysiology

## 2020-02-02 NOTE — Patient Instructions (Addendum)
It was a pleasure to see you again today.    Let's plan to continue Eliquis for 3 months post-procedure -- you can discontinue it on 03/22/2020.    Continue the metoprolol.      ===========================================      MyChart Messages  For your safety and best care, please DO NOT use MyChart messages to report symptoms. (Symptoms should be reported by calling [phone]). Please use MyChart for non-urgent matters such as general questions, non-urgent prescription refills, or non-urgent scheduling.     Please do not use MyChart for urgent messages in the evenings or on weekends, as messages are only checked during regular business hours.   -Please note that MyChart messages are routed to a central pool and one of your provider's team members will get back to you. Expect up to 3 business days for response.    Lab and test scheduling and results:  During your visit, your provider may order radiology exams or other tests. If you have not heard back within 1-2 weeks to be scheduled, please call:   For echocardiogram or stress-echo: [phone], Option 1.   For radiology (CT scan, MRI, PET scan, perfusion stress-test): [phone]   For other tests, call our office: [phone]  Test results may take up to 7- 10 business days and may be communicated to you in the following ways:    If you have signed up for MyChart, your results will be available on your MyChart account.    If you are scheduled for a follow up appointment after your tests are completed, your provider may wish to wait and discuss them during the follow up appointment.     We will not call you with the results of a normal test or device interrogation.     Procedure/Admission scheduling  Our EP procedure scheduling team will contact you to pick a date for your procedure or admission. Please call [phone] to speak with the schedulers if you do not hear back in 5 business days. This is the direct scheduling line and should only be used  for procedure scheduling.    Paging Operator:   If you have an urgent/emergent problem after 5pm, during the weekend, or on a holiday, please call [phone] and ask the operator to page the EP fellow on-call.    Temporary leave/disability forms directly related to EP procedures:  Please fill out all your demographic information and include anticipated work absence dates.  Please allow for a 2-week turnaround time for all paperwork submitted.   EP clinic fax: [phone]    Clinical Notes on My Chart:  Progress notes documented by your healthcare team will now be available on the MyChart portal. We believe that patients should be a part of the healthcare team. Please recognize that these notes are for documentation purposes and typos/minor errors may occur. If you identify any major discrepancies in the documentation, please let us know via MyChart or bring them to your next scheduled visit. With increased transparency, our hope is that we create more trust, better communication and increased satisfaction.

## 2020-02-24 MED FILL — LISINOPRIL 30 MG TABS: 30 | 90 days supply | Qty: 90 | Fill #0

## 2020-02-24 MED FILL — AMLODIPINE BESYLATE 10 MG T: 10 | 90 days supply | Qty: 90 | Fill #1

## 2020-03-25 MED FILL — metFORMIN HCL 1000 MG TABS: 1000 | 90 days supply | Qty: 180 | Fill #1

## 2020-03-25 MED FILL — EZETIMIBE 10 MG TABS: 10 | 90 days supply | Qty: 90 | Fill #1

## 2020-03-29 ENCOUNTER — Inpatient Hospital Stay: Admit: 2020-03-29 | Discharge: 2020-03-29 | Payer: BLUE CROSS/BLUE SHIELD | Attending: Physician | Primary: Physician

## 2020-03-29 DIAGNOSIS — I480 Paroxysmal atrial fibrillation: Secondary | ICD-10-CM

## 2020-04-11 ENCOUNTER — Other Ambulatory Visit: Payer: Self-pay | Admitting: Nurse Practitioner

## 2020-04-11 ENCOUNTER — Encounter: Payer: Self-pay | Admitting: Nurse Practitioner

## 2020-04-11 MED FILL — levETIRAcetam 500 MG TABS: 500 | 90 days supply | Qty: 180 | Fill #0

## 2020-04-11 NOTE — Telephone Encounter (Signed)
Pharmacy requested refill.  Pended Rx and sent to Jessica for approval.  

## 2020-05-09 ENCOUNTER — Ambulatory Visit (INDEPENDENT_AMBULATORY_CARE_PROVIDER_SITE_OTHER): Payer: 59 | Admitting: Nurse Practitioner

## 2020-05-09 ENCOUNTER — Other Ambulatory Visit: Payer: Self-pay

## 2020-05-09 ENCOUNTER — Encounter: Payer: Self-pay | Admitting: Nurse Practitioner

## 2020-05-09 VITALS — BP 122/80 | HR 88 | Temp 96.6°F | Ht 64.0 in | Wt 159.0 lb

## 2020-05-09 DIAGNOSIS — D509 Iron deficiency anemia, unspecified: Secondary | ICD-10-CM | POA: Diagnosis not present

## 2020-05-09 DIAGNOSIS — E119 Type 2 diabetes mellitus without complications: Secondary | ICD-10-CM | POA: Diagnosis not present

## 2020-05-09 DIAGNOSIS — E1169 Type 2 diabetes mellitus with other specified complication: Secondary | ICD-10-CM

## 2020-05-09 DIAGNOSIS — I10 Essential (primary) hypertension: Secondary | ICD-10-CM | POA: Diagnosis not present

## 2020-05-09 DIAGNOSIS — M1711 Unilateral primary osteoarthritis, right knee: Secondary | ICD-10-CM | POA: Diagnosis not present

## 2020-05-09 DIAGNOSIS — G40909 Epilepsy, unspecified, not intractable, without status epilepticus: Secondary | ICD-10-CM | POA: Diagnosis not present

## 2020-05-09 DIAGNOSIS — E785 Hyperlipidemia, unspecified: Secondary | ICD-10-CM

## 2020-05-09 NOTE — Patient Instructions (Signed)
To get TDAP at local pharmacy  To call [facility] imaging and schedule mammogram   [phone]  To make appt for ophthalmology- referral has been placed

## 2020-05-09 NOTE — Progress Notes (Signed)
Careteam: Patient Care Team: Lauree Chandler, NP as PCP - General (Geriatric Medicine)  PLACE OF SERVICE:  [place] Directive information Does Patient Have a Medical Advance Directive?: Yes, Type of Advance Directive: [place];Living will, Does patient want to make changes to medical advance directive?: No - Patient declined  Allergies  Allergen Reactions  . Simvastatin Other (See Comments)    Stomach pain, low back pain   . Shrimp [Shellfish Allergy] Other (See Comments)    Swelling and hypersensitivity    Chief Complaint  Patient presents with  . Medical Management of Chronic Issues    6 month follow-up and foot exam today. Discuss need for eye exam, mammogram, a1c, and TDap.     HPI: Patient is a 64 y.o. female for routine follow up Plans to retire next year.   Underwent supra-umbilical ventral hernia repair in June. Doing well post-op. Went back to work a little after a week.   DM- overdue for eye exam has not made appt.  Continues on metformin 1000 mg by mouth twice daily No hypoglycemia  Iron def anemia- continues on ferrous sulfate   htn- on lisinipril 30 mg daily, norvasc 10 mg daily   Hyperlipidemia- on zetia and crestor.   Hx of seizures- without recent seizures, continues on keppra 500 mg every 12 hours   Mammograms have been recommended but she has yet to schedule - she forgot  Osteoarthritis- pain in knee, taking collagen peptides and reports great benefits.  Review of Systems:  Review of Systems  Constitutional: Negative for chills, fever and weight loss.  HENT: Negative for tinnitus.   Respiratory: Negative for cough, sputum production and shortness of breath.   Cardiovascular: Negative for chest pain, palpitations and leg swelling.  Gastrointestinal: Negative for abdominal pain, constipation, diarrhea and heartburn.  Genitourinary: Negative for dysuria, frequency and urgency.  Musculoskeletal: Negative for  back pain, falls, joint pain and myalgias.  Skin: Negative.   Neurological: Negative for dizziness and headaches.  Psychiatric/Behavioral: Negative for depression and memory loss. The patient does not have insomnia.     Past Medical History:  Diagnosis Date  . Allergy   . Anemia, unspecified   . Asthma    as a child, no problem as adult, no inhaler  . Bradycardia    past hx   . Diabetes mellitus    Type II  . History of blood transfusion    pre hysterectomy  . Osteoarthrosis, unspecified whether generalized or localized, unspecified site   . Other and unspecified hyperlipidemia   . Other convulsions    last one 2013  . Other malaise and fatigue   . Other specified cardiac dysrhythmias(427.89) 2006   "history of Bradycardia" .  wore a mointor nothing found  . Seizure disorder ([place])    last seizure 2013 per pt 05-12-2019 PV   . Unspecified essential hypertension   . Unspecified vitamin D deficiency    Past Surgical History:  Procedure Laterality Date  . ABDOMINAL HYSTERECTOMY    . APPENDECTOMY    . BREAST LUMPECTOMY Right 1997  . BREAST LUMPECTOMY Bilateral    neg for cancer  . CHOLECYSTECTOMY N/A 02/28/2016   Procedure: LAPAROSCOPIC CHOLECYSTECTOMY;  Surgeon: Mickeal Skinner, MD;  Location: [facility];  Service: General;  Laterality: N/A;  . IR CATHETER TUBE CHANGE  01/02/2018  . IR RADIOLOGIST EVAL & MGMT  12/25/2017  . IR RADIOLOGIST EVAL & MGMT  01/08/2018  . LAPAROSCOPIC APPENDECTOMY  N/A 01/15/2018   Procedure: LAPAROSCOPIC APPENDECTOMY CONVERTED TO OPEN APPENDECTOMY SMALL BOWEL RESECTION;  Surgeon: Donnie Mesa, MD;  Location: [facility];  Service: General;  Laterality: N/A;  . SUPRA-UMBILICAL HERNIA N/A 5/78/4696   Procedure: OPEN REPAIR OF SUPRA-UMBILICAL VENTRAL HERNIA WITH MESH;  Surgeon: Donnie Mesa, MD;  Location: [facility];  Service: General;  Laterality: N/A;   Social History:   reports that she has never smoked. She has never used smokeless tobacco. She reports that  she does not drink alcohol and does not use drugs.  Family History  Problem Relation Age of Onset  . Cancer Father        Esophageal  . Esophageal cancer Father   . Colon polyps Neg Hx   . Colon cancer Neg Hx   . Rectal cancer Neg Hx   . Stomach cancer Neg Hx     Medications: Patient's Medications  New Prescriptions   No medications on file  Previous Medications   AMLODIPINE (NORVASC) 10 MG TABLET    Take 1 tablet (10 mg total) by mouth daily.   BIOTIN PO    Take 1 tablet by mouth daily.   BLOOD GLUCOSE MONITORING SUPPL (FREESTYLE LITE) DEVI    Use to test blood sugar twice daily. Dx: E11.9   CETIRIZINE (ZYRTEC) 10 MG TABLET    Take 10 mg by mouth at bedtime.   COLLAGEN PO    Take by mouth daily. Powder formula, adds to coffee   EZETIMIBE (ZETIA) 10 MG TABLET    TAKE ONE TABLET BY MOUTH ONCE DAILY.   FERROUS SULFATE 325 (65 FE) MG TABLET    Take 1 tablet (325 mg total) by mouth 2 (two) times daily with a meal.   GLUCOSE BLOOD (FREESTYLE LITE) TEST STRIP    Use to test blood sugar twice daily. Dx: E11.9   GLUCOSE BLOOD TEST STRIP    Use as instructed   LANCETS (FREESTYLE) LANCETS    Use to test blood sugar twice daily. Dx: E11.9   LEVETIRACETAM (KEPPRA) 500 MG TABLET    TAKE 1 TABLET BY MOUTH EVERY 12 HOURS FOR SEIZURES   LISINOPRIL (ZESTRIL) 30 MG TABLET    Take 1 tablet (30 mg total) by mouth daily.   METFORMIN (GLUCOPHAGE) 1000 MG TABLET    TAKE 1 TABLET BY MOUTH 2 TIMES DAILY WITH MEALS TO CONTROL BLOOD SUGAR   ROSUVASTATIN (CRESTOR) 5 MG TABLET    Take 0.5 tablets (2.5 mg total) by mouth at bedtime.  Modified Medications   No medications on file  Discontinued Medications   OXYCODONE (OXY IR/ROXICODONE) 5 MG IMMEDIATE RELEASE TABLET    Take 1 tablet (5 mg total) by mouth every 6 (six) hours as needed for severe pain.    Physical Exam:  Vitals:   05/09/20 1558  BP: 122/80  Pulse: 88  Temp: (!) 96.6 F (35.9 C)  TempSrc: Temporal  SpO2: 98%  Weight: 159 lb (72.1 kg)   Height: 5\' 4"  (1.626 m)   Body mass index is 27.29 kg/m. Wt Readings from Last 3 Encounters:  05/09/20 159 lb (72.1 kg)  12/10/19 168 lb 7 oz (76.4 kg)  12/04/19 168 lb 7 oz (76.4 kg)    Physical Exam Constitutional:      General: She is not in acute distress.    Appearance: She is well-developed. She is not diaphoretic.  HENT:     Head: Normocephalic and atraumatic.     Mouth/Throat:     Pharynx: No oropharyngeal  exudate.  Eyes:     Conjunctiva/sclera: Conjunctivae normal.     Pupils: Pupils are equal, round, and reactive to light.  Cardiovascular:     Rate and Rhythm: Normal rate and regular rhythm.     Heart sounds: Normal heart sounds.  Pulmonary:     Effort: Pulmonary effort is normal.     Breath sounds: Normal breath sounds.  Abdominal:     General: Bowel sounds are normal.     Palpations: Abdomen is soft.  Musculoskeletal:        General: No tenderness.     Cervical back: Normal range of motion and neck supple.  Skin:    General: Skin is warm and dry.  Neurological:     Mental Status: She is alert and oriented to person, place, and time.     Labs reviewed: Basic Metabolic Panel: Recent Labs    11/04/19 1631 12/04/19 1500  NA 141 140  K 3.7 3.6  CL 105 106  CO2 28 24  GLUCOSE 99 138*  BUN 18 15  CREATININE 0.83 0.73  CALCIUM 9.5 9.0   Liver Function Tests: Recent Labs    11/04/19 1631  AST 18  ALT 15  BILITOT 0.4  PROT 6.8   No results for input(s): LIPASE, AMYLASE in the last 8760 hours. No results for input(s): AMMONIA in the last 8760 hours. CBC: Recent Labs    11/04/19 1631 12/04/19 1500  WBC 6.8 6.3  NEUTROABS 2,734  --   HGB 11.5* 12.7  HCT 35.7 40.5  MCV 79.9* 84.0  PLT 275 321   Lipid Panel: Recent Labs    11/04/19 1631  CHOL 116  HDL 58  LDLCALC 42  TRIG 81  CHOLHDL 2.0   TSH: No results for input(s): TSH in the last 8760 hours. A1C: Lab Results  Component Value Date   HGBA1C 6.4 (H) 11/04/2019      Assessment/Plan 1. Controlled type 2 diabetes mellitus without complication, without long-term current use of insulin (HCC) -continues on metformin 1000 mg BID with dietary modifications. Continues to increase physical activity.  Encouraged dietary compliance, routine foot care/monitoring and to keep up with diabetic eye exams through ophthalmology  - Ambulatory referral to Ophthalmology - Hemoglobin A1c  2. Seizure disorder ([place]) Stable, Without recent seizure on keppra 500 mg BID.  3. Essential hypertension Controlled on lisinopril and amlodipine with dietary modifications and increase in physical activity - CBC with Differential/Platelet - COMPLETE METABOLIC PANEL WITH GFR  4. Hyperlipidemia associated with type 2 diabetes mellitus ([place]) -LDL at goal in May, continues on crestor 2.5 mg daily and zetia 10 mg daily  5. Iron deficiency anemia, unspecified iron deficiency anemia type -continues on iron supplement.  - CBC with Differential/Platelet  6. Primary osteoarthritis of right knee Stable at this time with decrease in pain.   Next appt: 6 month  Magdelena Kinsella K. Norris, [facility] [phone]

## 2020-05-11 LAB — COMPLETE METABOLIC PANEL WITH GFR
AG Ratio: 1.7 (calc) (ref 1.0–2.5)
ALT: 12 U/L (ref 6–29)
AST: 14 U/L (ref 10–35)
Albumin: 4.3 g/dL (ref 3.6–5.1)
Alkaline phosphatase (APISO): 63 U/L (ref 37–153)
BUN: 15 mg/dL (ref 7–25)
CO2: 23 mmol/L (ref 20–32)
Calcium: 9.5 mg/dL (ref 8.6–10.4)
Chloride: 107 mmol/L (ref 98–110)
Creat: 0.82 mg/dL (ref 0.50–0.99)
GFR, Est African American: 88 mL/min/{1.73_m2} (ref >=60)
GFR, Est Non African American: 76 mL/min/{1.73_m2} (ref >=60)
Globulin: 2.5 g/dL (calc) (ref 1.9–3.7)
Glucose, Bld: 111 mg/dL — ABNORMAL HIGH (ref 65–99)
Potassium: 3.8 mmol/L (ref 3.5–5.3)
Sodium: 141 mmol/L (ref 135–146)
Total Bilirubin: 0.4 mg/dL (ref 0.2–1.2)
Total Protein: 6.8 g/dL (ref 6.1–8.1)

## 2020-05-11 LAB — CBC WITH DIFFERENTIAL/PLATELET
Absolute Monocytes: 354 cells/uL (ref 200–950)
Basophils Absolute: 59 cells/uL (ref 0–200)
Basophils Relative: 1 %
Eosinophils Absolute: 201 cells/uL (ref 15–500)
Eosinophils Relative: 3.4 %
HCT: 35.4 % (ref 35.0–45.0)
Hemoglobin: 11.5 g/dL — ABNORMAL LOW (ref 11.7–15.5)
Lymphs Abs: 2590 cells/uL (ref 850–3900)
MCH: 26.4 pg — ABNORMAL LOW (ref 27.0–33.0)
MCHC: 32.5 g/dL (ref 32.0–36.0)
MCV: 81.4 fL (ref 80.0–100.0)
MPV: 9.9 fL (ref 7.5–12.5)
Monocytes Relative: 6 %
Neutro Abs: 2696 cells/uL (ref 1500–7800)
Neutrophils Relative %: 45.7 %
Platelets: 308 10*3/uL (ref 140–400)
RBC: 4.35 10*6/uL (ref 3.80–5.10)
RDW: 13.6 % (ref 11.0–15.0)
Total Lymphocyte: 43.9 %
WBC: 5.9 10*3/uL (ref 3.8–10.8)

## 2020-05-11 LAB — IRON,TIBC AND FERRITIN PANEL
%SAT: 19 % (calc) (ref 16–45)
Ferritin: 44 ng/mL (ref 16–288)
Iron: 62 ug/dL (ref 45–160)
TIBC: 320 mcg/dL (calc) (ref 250–450)

## 2020-05-11 LAB — TEST AUTHORIZATION

## 2020-05-11 LAB — HEMOGLOBIN A1C
Hgb A1c MFr Bld: 6.8 % of total Hgb — ABNORMAL HIGH (ref <5.7)
Mean Plasma Glucose: 148 (calc)
eAG (mmol/L): 8.2 (calc)

## 2020-05-13 ENCOUNTER — Other Ambulatory Visit: Payer: Self-pay

## 2020-05-13 DIAGNOSIS — D509 Iron deficiency anemia, unspecified: Secondary | ICD-10-CM

## 2020-05-24 MED FILL — AMLODIPINE BESYLATE 10 MG T: 10 | 90 days supply | Qty: 90 | Fill #2

## 2020-05-24 MED FILL — ROSUVASTATIN CALCIUM 5 MG T: 5 | 90 days supply | Qty: 45 | Fill #0

## 2020-05-24 MED FILL — LISINOPRIL 30 MG TABS: 30 | 90 days supply | Qty: 90 | Fill #1

## 2020-06-13 ENCOUNTER — Other Ambulatory Visit: Payer: 59

## 2020-06-13 ENCOUNTER — Other Ambulatory Visit: Payer: Self-pay

## 2020-06-13 DIAGNOSIS — D509 Iron deficiency anemia, unspecified: Secondary | ICD-10-CM | POA: Diagnosis not present

## 2020-06-13 LAB — CBC
HCT: 34.9 % — ABNORMAL LOW (ref 35.0–45.0)
Hemoglobin: 11.4 g/dL — ABNORMAL LOW (ref 11.7–15.5)
MCH: 26.9 pg — ABNORMAL LOW (ref 27.0–33.0)
MCHC: 32.7 g/dL (ref 32.0–36.0)
MCV: 82.3 fL (ref 80.0–100.0)
MPV: 10.4 fL (ref 7.5–12.5)
Platelets: 292 10*3/uL (ref 140–400)
RBC: 4.24 10*6/uL (ref 3.80–5.10)
RDW: 13.1 % (ref 11.0–15.0)
WBC: 7.5 10*3/uL (ref 3.8–10.8)

## 2020-06-30 ENCOUNTER — Other Ambulatory Visit: Payer: Self-pay | Admitting: Nurse Practitioner

## 2020-06-30 DIAGNOSIS — E1169 Type 2 diabetes mellitus with other specified complication: Secondary | ICD-10-CM

## 2020-06-30 MED FILL — METFORMIN HCL 1000 MG TABS: 1000 | 90 days supply | Qty: 180 | Fill #0

## 2020-06-30 MED FILL — EZETIMIBE 10 MG TABS: 10 | 90 days supply | Qty: 90 | Fill #0

## 2020-07-20 ENCOUNTER — Other Ambulatory Visit: Payer: Self-pay | Admitting: Nurse Practitioner

## 2020-07-20 NOTE — Telephone Encounter (Signed)
Please advise if this medication requires lab work to monitor?

## 2020-07-21 ENCOUNTER — Other Ambulatory Visit: Payer: Self-pay | Admitting: Nurse Practitioner

## 2020-07-21 MED FILL — levETIRAcetam 500 MG TABS: 500 | 90 days supply | Qty: 180 | Fill #0

## 2020-07-21 NOTE — Telephone Encounter (Signed)
No drug monitoring needed for this medication

## 2020-07-26 ENCOUNTER — Ambulatory Visit: Admit: 2020-07-26 | Discharge: 2020-08-26 | Payer: BLUE CROSS/BLUE SHIELD | Attending: Physician | Primary: Physician

## 2020-07-26 DIAGNOSIS — I480 Paroxysmal atrial fibrillation: Secondary | ICD-10-CM

## 2020-07-26 NOTE — Patient Instructions (Addendum)
It was a pleasure to see you again in Electrophysiology!    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    It is safe to stay off the Eliquis for now, but we should consider restarting it after you turn 65 if there are any episodes of atrial fibrillation detected by the pacemaker.    Let's plan to follow-up in 1 year, or sooner as needed.      ===========================================      MyChart Messages  For your safety and best care, please DO NOT use MyChart messages to report symptoms. (Symptoms should be reported by calling [phone]). Please use MyChart for non-urgent matters such as general questions, non-urgent prescription refills, or non-urgent scheduling.     Please do not use MyChart for urgent messages in the evenings or on weekends, as messages are only checked during regular business hours.   -Please note that MyChart messages are routed to a central pool and one of your provider's team members will get back to you. Expect up to 3 business days for response.    Lab and test scheduling and results:  During your visit, your provider may order radiology exams or other tests. If you have not heard back within 1-2 weeks to be scheduled, please call:   For echocardiogram or stress-echo: [phone], Option 1.   For radiology (CT scan, MRI, PET scan, perfusion stress-test): [phone]   For other tests, call our office: [phone]  Test results may take up to 7- 10 business days and may be communicated to you in the following ways:    If you have signed up for MyChart, your results will be available on your MyChart account.    If you are scheduled for a follow up appointment after your tests are completed, your provider may wish to wait and discuss them during the follow up appointment.     We will not call you with the results of a normal test or device interrogation.     Procedure/Admission scheduling  Our EP  procedure scheduling team will contact you to pick a date for your procedure or admission. Please call [phone] to speak with the schedulers if you do not hear back in 5 business days. This is the direct scheduling line and should only be used for procedure scheduling.    Paging Operator:   If you have an urgent/emergent problem after 5pm, during the weekend, or on a holiday, please call [phone] and ask the operator to page the EP fellow on-call.    Temporary leave/disability forms directly related to EP procedures:  Please fill out all your demographic information and include anticipated work absence dates.  Please allow for a 2-week turnaround time for all paperwork submitted.   EP clinic fax: [phone]    Clinical Notes on My Chart:  Progress notes documented by your healthcare team will now be available on the MyChart portal. We believe that patients should be a part of the healthcare team. Please recognize that these notes are for documentation purposes and typos/minor errors may occur. If you identify any major discrepancies in the documentation, please let us know via MyChart or bring them to your next scheduled visit. With increased transparency, our hope is that we create more trust, better communication and increased satisfaction.

## 2020-07-26 NOTE — Progress Notes (Signed)
I performed this consultation using real-time Telehealth tools, including a live video connection between my location and the patient's location. Prior to initiating the consultation, I obtained informed verbal consent to perform this consultation using Telehealth tools and answered all the questions about the Telehealth interaction.    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 as a telehealth video visit in Cardiac Electrophysiology today for further evaluation of symptomatic bradycardia, status post dual-chamber pacemaker, atrial fibrillation, and NSVT.    [name] is a 65 y.o. 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    Allow me to review her history for the electronic MEDICAL RECORD NUMBER  Exertional fatigue and mild shortness of breath started on 08/23/2015 and persisted thereafter, were also sometimes associated with lightheadedness.  She had previously been very active, feeding and riding her horse nearly every day.  Evaluation was notable for a baseline right bundle branch block, and periods of symptomatic 2:1 AV block while reaching peak exercise on a treadmill stress test (see details below).  Holter monitoring also reportedly showed blocked PAC's with resultant bradycardia, though to my review blocked atrial activity was not typically premature (see details below). 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     A dual-chamber device was implanted without complication on 10/23/256.  Thereafter, she noticed significant improvement in her symptoms, but had one episode very similar to her pre-pacemaker symptoms on 11/18/2015 and several milder  episodes afterwards aborted by resting.  Device function was normal -- at follow-up 11/2015, she paced 5% in the RA and 1% in the RV.  She underwent breast augmentation surgery on 04/18/2016.     At her visit in April 2018, she reported having a "wide complex tachycardia" for 5 minutes (up to 115 bpm) after a colonoscopy 06/2016.  She also started having episodes of palpitations in February and specifically recalled symptomatic episodes on 2/12 and 2/24 -- the latter correlated with a >1 hour episode of atrial fibrillation recorded by the device.  She was started on aspirin 81 mg daily and recommended to take metoprolol 12.5 mg twice daily, though opted not to take it.     At her visit in 03/2017, she reported shortness of breath with exertion (such as walking up stairs) and wondered if there could be a functional issue with her heart -- she actually contacted me prior to the visit to request that an echocardiogram be scheduled the same day.  The echocardiogram showed normal LV function and no hemodynamically significant valvular disease.  Device interrogation showed 62 minutes of atrial fibrillation between April and October 2018, with the longest episode lasting 11:30 minutes on 01/01/2017.  Rate histograms showed a shift to the left, with atrial sensing or pacing in the 60s more than 50% of the time and only rare atrial sensing above 90 bpm.  We agreed to try adding rate-response to her device, and to continue conservative management of the rare atrial fibrillation.  The rate-response was later disabled because she was feeling "jittery."   Device check in 06/2017 showed 0.1% atrial high rate detection (10 episodes), with the longest 53 minutes.   Device  check in 10/2017 showed 0.1% atrial high rate detection, with the longest episode 35 minutes.  There was also a 20 beat episode of NSVT recorded on 09/17/2017, correlating with an episode of lightheadedness (see my note dated 11/15/2017).  I recommended she try  taking metoprolol 12.5 mg twice daily and consider cardiac MRI and/or PET (particularly given the paroxysmal AV block and atrial fibrillation).   Cardiac MRI in 12/2017 revealed mid-wall and subepicardial late gadolinium enhancement in the lateral wall at the level of the base.  A follow-up PET CT scan showed no abnormal FDG uptake and no resting perfusion defects.   She contacted me 03/19/2018 to report some chest pain "off and on" starting the day before and sent remote device transmissions.   At her visit in 04/2018, she was feeling well overall. She had occasional symptoms that corresponded to atrial fibrillation, though she did not feel it was impacting her life significantly. She denied any additional lightheadedness. She preferred to continue metoprolol for atrial fibrillation.    She had a follow-up echocardiogram in early May 2020 showing normal LV function (details below).  Device interrogations continued to show an overall low burden of atrial fibrillation.   At follow-up 11/18/2018, she was feeing "pretty good". She had some sensation of an irregular heart beat at night affecting sleep. We increased her metoprolol evening dose. Device interrogation showed rare brief NSVT.    On 04/14/2019, she presented to the ED at [facility] with episodes of low blood pressure of 77O systolic at home over the prior several days and some "off or blurry" vision. She reportedly had no focal neurologic deficits, and per records from her hospitalization, it was not felt symptoms were consistent with stroke. She was treated with IV fluids and discharged from ED.    Remote device interrogation 04/16/2019 showed normal function, with 106 episodes of atrial fibrillation since 02/10/2019 (burden 1.7%).  The longest formally measured episode was 3 hours 40 min on 03/11/2019, though based on the log I suspect she was continuously in atrial fibrillation from about midnight until 9 am on 04/13/2019; average ventricular rates  were in the 80s.   At follow-up 04/21/2019, she was  "hanging in there."  During the recent atrial fibrillation episode, she felt an irregular heart rate, but not a "super high" pulse.  She did not take her metoprolol, being concerned about lowering her blood pressure further. She continued to have some shortness of breath with exertion, particularly at altitude, forcing her to rest more than she felt she should have to.  We recalled that previously enabling the rate-response feature on her pacemaker in 03/2017 made her feel anxious and jittery.  We reviewed rhythm control options, but she preferred to continue low-dose metoprolol to try to prevent episodes.   Device interrogation 10/12/2019 showed 54 high rate atrial episodes, with burden 3% -- the longest was 08/01/2019 for 6 hours and 24 minutes.  Histograms show predominant V rates 60-140 bpm with minimal counts in bins to 180 bpm during AF.  She contacted me with questions about 10/10/2019 after 11pm: episode logs show atrial fibrillation from about 1 am to about 6 am on 10/11/2019.   At her visit 10/20/2019, she reported fatigue and low blood pressure that prompted her to decrease her metoprolol back to 12.5 mg twice daily.  She got the first dose of Pfizer Covid-19 vaccine in 06/2019 and had atrial fibrillation that night -- she planned to defer the second shot for the time being.  After discussion, she decided to proceed with ablation for the atrial fibrillation.   The ablation procedure was performed 12/21/2019.  All 4 PVs were isolated.  She was discharged the following day.     Remote transmission from her device 02/01/2020 recorded 2 episodes of non-sustained AF since the ablation, on 01/23/2020 for 9 seconds and on 01/26/2020 for 13 seconds -- however, EGMs appeared consistent with brief oversensing.   At follow-up in 01/2020, she was feeling "OK" overall, with days when she felt very "tired" and "out of it."  She had some palpitations that didn't feel like  atrial fibrillation. Device interrogation showed no arrhythmia episodes, and I recommended she continue metoprolol therapy.  She discontinued apixaban 03/22/2020 (3 months post-procedure)   She contacted me in 03/2020 with episodes of palpitations and brief episodes of sharp, "shooting chest pain."  Device interrogation showed no major arrhythmia episodes.  There was a total of 24 minutes of atrial fibrillation from 8/9 through 10/1, but none lasting more than a minute (so no recordings to review).  We decided to proceed with Zio monitoring.   The Zio and subsequent interrogations showed no atrial fibrillation.     She developed Covid 07/06/2020.  She subsequently reported sensations like she was not getting enough air (different than atrial fibrillation).   Device interrogation 07/19/2020 showed 4 episodes of AT/AF since 06/14/2020, the longest 30 seconds on 07/12/2020 at 3:23pm.  EGMs all showed atrial oversensing.    Today, Debbie Harper is feeling well overall.  She has recovered from the Covid (she had fevers, chills, body aches, and "arrhythmias") and started back at work last week.  The shortness of breath sensations she experienced towards the end of the infection have improved.  She feels the atrial fibrillation has been "much better" since the ablation procedure -- she does think she has had some brief episodes, though there have been no true episodes recorded by the pacemaker in recent months.    She has "a little pain" on the left side of the breast area "here and there."  She denies orthopnea, paroxysmal nocturnal dyspnea, lower extremity edema, presyncope, or syncope.  She has what she thinks could be the NSVT occasionally -- it feels like a "strong" rapid heartbeat, sometimes associated with brief lightheadedness.  Episodes tend to happen at rest.  The last episode recorded by the pacemaker was 8 beats on 06/25/2020 (around 5 am), but she was likely asleep.     Starting in 08/2020 she will be reducing her  work hours and switching to [employer]    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    History of motion sickness     IBS (irritable bowel syndrome) 10/18/2015    IBS (irritable bowel syndrome)     Mobitz type 2 second degree atrioventricular block 10/18/2015    s/p pacemaker     Pacemaker 11/29/2015    RBBB 10/18/2015    Thyroid nodule 10/18/2015    since 2003       CURRENT MEDICATIONS     Your Medications at the End of This Visit       Disp Refills Start End    metoprolol tartrate (LOPRESSOR) 25 mg tablet        Sig - Route: Take 12.5 mg by mouth Twice a day    - Oral    Class: Historical Med    lansoprazole (  PREVACID) 30 mg capsule 14 capsule 0 12/23/2019     Sig - Route: Take 1 capsule (30 mg total) by mouth daily Please take medication for two weeks then stop. - Oral    Renewals     Renewal requests to authorizing provider Webb Silversmith Nori Riis Destruel, NP) <b>prohibited</b>                ALLERGIES     Allergies as of 07/26/2020  Review status set to Review Complete by Virgel Bouquet, MD on 02/02/2020      Severity Noted Reaction Type Reactions    Sulfamethoxazole-trimethoprim Not Specified 10/18/2015    Itching    Insomnia          SOCIAL HISTORY     She reports that she has never smoked. She has never used smokeless tobacco. She reports current alcohol use. She reports that she does not use drugs.    She is divorced.  She has 3 children.  She lives in [place], [place].  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    She has 2 horses (a Engineer, water and a Woodruff, ages 19 and 47).  She also wakeboards in the summer.    FAMILY HISTORY     Her father underwent CABG in his late 42's and has cardiomyopathy.  He was a heavy smoker. He passed away in early 09-15-18.    Her mother had a heart attack around age 18.  She died after complications from a head injury in June 2016 after being knocked down by a horse.  Her grandfather had  colon cancer.    REVIEW OF SYSTEMS     Gastrointestinal:  + IBS with cramping and gas.  No blood in the stool.  + polyps on colonoscopy in 15-Sep-2013.    PHYSICAL EXAMINATION     Constitutional: Patient appears well-developed and well-nourished. Pleasant and appropriately interactive.  Head: Normocephalic and atraumatic.   Neck: Normal range of motion.   Pulmonary/Chest: Effort normal. No respiratory distress. No cough.  Neurological: Alert and oriented to person, place, and time.   Psychiatric: Normal mood and affect. Behavior is normal. Judgment and thought content normal.   Skin: No visible rash.     DATA     I reviewed prior 12-lead EKGs:  - 12/21/2019: sinus rhythm with right bundle branch block at 75 bpm  - 04/02/2017: competing sinus rhythm and atrial pacing at 60 bpm with intact AV conduction and right bundle branch block (QRSd 124 msec).  - 10/02/2016: atrial pacing at 61 bpm with intact AV conduction and right bundle branch block  - 11/29/2015: normal sinus rhythm with right bundle branch block at 64 bpm.  - 10/18/2015: sinus bradycardia at 57 bpm with right bundle branch block  - 08/29/2015: sinus bradycardia at 57 bpm with right bundle branch block.    Remote pacemaker interrogation 07/19/2020 showed 4 episodes of AT/AF since 06/14/2020, the longest 30 seconds on 07/12/2020 at 3:23pm.  There was one 8 beat episode of NSVT on 06/25/2020 at about 5am.  AT/AF EGMs all showed atrial oversensing and not true atrial fibrillation:      Remote transmission from her device 02/01/2020 recorded 2 episodes of non-sustained AF since the ablation, on 01/23/2020 for 9 seconds and on 01/26/2020 for 13 seconds, but EGMs appeared consistent with brief oversensing and not true atrial fibrillation.    Remote pacemaker interrogation 10/12/2019 showed 54 high rate atrial episodes, with 46% A-pacing and  4% V-pacing.  AT/AF burden was 3% -- the longest episode was 08/01/2019 for 6 hours and 24 minutes (though other shorter episodes may have been part of  one longer episode, including on 10/11/2019).  Histograms show predominant V rates 60-140 bpm with minimal counts in bins to 180 bpm during AF.      Remote pacemaker interrogation 04/16/2019 showed normal overall function, with 48% atrial pacing and 2.9% ventricular pacing.  Total atrial fibrillation burden was 1.7% since 01/2019, with longest episode 3 hours 40 minutes on 03/11/2019.  There were multiple episodes 10/18-10/19/2020.  The longest episode within that time period measured formally was 1 h 20 min, though based on the log I suspect she was continuously in atrial fibrillation from ~midnight until ~9am on 04/13/2019.    Remote pacemaker interrogation 11/18/2018 (see separate full report in [facility]):    - She paces 45.4% in the RA (stable) and 4.6% in the RV (stable).    - Atrial fibrillation burden has been 0.9%, the longest episode lasting 5 hours on 11/07/2018 with max V-rate of 130 bpm (max V-rate during all atrial fibrillation episodes 133 bpm)  - No NSVT recorded  - Many longer episodes were overnight    I reviewed the report and tracings from a Zio patch worn 10/13-10/27/2021, which showed:  - Patient had a min HR of 60 bpm, max HR of 103 bpm, and avg HR of 66 bpm. Predominant underlying rhythm was Sinus Rhythm.   - Isolated SVEs were rare (<1.0%), SVE Couplets were rare (<1.0%), and SVE Triplets were rare (<1.0%).   - Isolated VEs were rare (<1.0%), VE Couplets were rare (<1.0%), and no VE Triplets were present.  - Final interpretation:  1. Predominant rhythm is sinus. Intermittent atrial and ventricular pacing noted.  2. Supraventricular and ventricular ectopic burden as above.  3. Single patient triggered event demonstrates sinus rhythm.  4. No identified atrial fibrillation.    I reviewed the report and tracings from a 24-hour Holter monitor 08/31/2015 [facility]):  - Average HR 64.  Minimum 38.  Maximum 97.  Predominant rhythm sinus.  - Single PVC's occasional; no runs.  - Frequent blocked PACs  with resultant bradycardia.  No SVT or atrial fibrillation.    - Occasional shortness of breath during periods of bradycardia with blocked PACs.  - On my review of tracings, several strips appear more consistent with sinus rhythm with 2:1 block than with blocked PAC's based on timing (though P-wave morphology is subtly different with blocked beats).  On tracings with both 2:1 and 1:1 conduction, the P-wave CL remains constant and ventricular rate exactly 1/2 during 2:1 block.  When transitioning from 1:1 to 2:1 block, the first blocked P-wave comes slightly early, but possibly due to increase in sinus rate.  - Less frequently (particularly 2:26am), bradycardia appears due to blocked PAC's in bigeminy.  - One period of 3:2 conduction (9:28am) without clear PR prolongation    I reviewed the reports of the following tests:   Echocardiogram 10/31/2018 ([facility]):       Cardiac PET 02/17/2018:  1.   Normal perfusion in the left ventricle without myocardial uptake of FDG. Findings are suggestive of no active myocardial inflammatory process or scar.  2.    Normal left ventricular wall motion with a calculated left ventricular ejection fraction of >65% on rest perfusion images.     Cardiac MRI 12/31/2017:  1. Mid wall and subepicardial late gadolinium enhancement in the lateral  wall at the level of the base which can be seen with myocarditis or other inflammatory cardiomyopathy. Remaining myocardium demonstrates no evidence of scar.  2. Normal left ventricular function (ejection fraction 73%).     Echocardiogram 04/02/2017:  The patient's blood pressure was 154 mmHg/70 mmHg during the study. The heart rate during the study was 73. Color Flow Doppler was utilized for this exam. Spectral Doppler was utilized for this exam.  1. The left ventricular volume is normal. LV function is hyperdynamic. LV ejection fraction is estimated to be 70 to 75%.  2. The right ventricular volume is normal. Right ventricular  function is normal.  3. Left atrial size is normal. Right atrial size is normal.  4. There is no hemodynamically significant valvular disease.  5. There is no Doppler evidence of LV diastolic dysfunction.  6. The RV systolic pressure is estimated to be 23 mmHg based on a right atrial pressure of 3 mmHg.  7. No pericardial effusion noted. The inferior vena cava is less than 21 mm in diameter and collapses with inspiration consistent with a right atrial pressure of 3 mmHg.  8. Aortic root dimension is normal.  - Compared to the previous study on 09/23/15, the prior study is a stress echo.     Exercise stress echocardiogram 09/23/2015 [facility]):  - Resting EKG sinus rhythm with right bundle branch block   - Resting echocardiogram: Normal LV size, wall thickness, and function, EF 60-65%.  Atria normal size.  RV normal.  Mild anterior leaflet MVP and mild MR.  No other significant valvular pathology.  No pericardial effusion.  - Walked 8 min 11 sec on [place], with peak HR 118 bpm early but development of 2:1 AV block at peak exercise with HR in the 60s and associated shortness of breath (2:1 block versus atrial bigeminy with block).  Returned to 1:1 conduction in recovery.  Episodes of supraventricular bigeminy noted.  - I reviewed the tracings:  - At 2:24, there is a period of 3:2 conduction with constant P-P interval and no clear PR prolongation, that appears to persist through 5:50  - HR then drops from 89 to 74 but P-waves difficult to see  - At 6:50 there appears to be 2:1 block with occasional outflow tract PVC's  - At 7:50 there is suspicion for 2:1 conduction with blocked P-waves in prior T-waves.  - At 15 seconds into recovery there are periods with 1:1 conduction at double the rate of final exercise EKGs (5:4 and 6:5 conduction, but with little to no change in PR interval and R-R interval around blocked P-wave exactly double that of conducted intervals).  This apparent Mobitz type II second degree AV  block becomes more apparent as the rate continues to slow in recovery.  - By 2 minutes into recovery there is clear 2:1 AV block that persists to 3 minutes, though with one episode of 3:2 block that appears slightly more consistent with Wenckebach based on the PR intervals and length of pause  - Stress echocardiogram:  Appropriate increase in contractility of all segments; no evidence of ischemia.     Chest CTA 09/22/2015 [facility]):  - Negative for PE or other explanation for shortness of breath.  - Few liver lesions, probably cysts.    - Ascending and descending aorta normal caliber     PFTs 09/08/2015:  - FVC, FEV1, FEV1/FVC ratio, and FEF25-75% within normal limits  - TLC within normal limits  - FRC and RV increased  -  With bronchodilators, there is significant response.  - Diffusing capacity high.  - Conclusions: Increased diffusing capacity consistent with left heart failure or shunting.  Although flow rates are within normal limits, the overinflation and response to bronchodilators are characteristic of reactive airways ("minimal")    I reviewed laboratory evaluation from [facility] dated 04/14/2019:  Na 141, K 3.8, Cl 105, CO2 32, BUN 17, Cr 0.6, Glu 100  LFTs normal (alk phos low at 44)  WBC 3.7, Hb 13.5, Plt 143  TnI <0.012  nT-BNP 238  U/A negative      ASSESSMENT AND RECOMMENDATIONS     In summary, [name] is a 65 y.o. 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  She was subsequently diagnosed with paroxysmal atrial fibrillation, as well as occasional NSVT, and underwent ablation for atrial fibrillation 12/21/2019.  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    We focused our evaluation and  discussion today on the following problems:    1) Atrial fibrillation:  - She had a prior history of relatively infrequent palpitations and was been diagnosed with paroxysmal atrial fibrillation via device interrogation.  The burden continued to slowly increase, and she was symptomatic with longer episodes.  She decided to proceed with catheter ablation, performed 12/21/2019 -- device interrogation has shown no episodes of true atrial fibrillation since, and her symptoms are markedly improved.    We agreed on the following plan:  - We will continue to monitor arrhythmia burden via the implanted device  - She will continue metoprolol    2) NSVT:  - She had a symptomatic, 20-beat run of NSVT in 08/2017 associated with significant lightheadedness.  We initiated the metoprolol after that, with improvement in NSVT burden, though she continues to have brief, occasional episodes.  - Given the combination of AV block, sinus node dysfunction, paroxysmal atrial fibrillation, and NSVT, I suggested further evaluation for inflammatory cardiomyopathy such as sarcoid.  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  The etiology of the LGE is not clear -- perhaps she had a prior episode of myocarditis.    - The combination of the LGE and NSVT is obviously concerning, though she has normal LV function, no sustained ventricular arrhythmias, and no syncope.  Since starting low dose metoprolol she has had only infrequent NSVT. I do not think there is an indication for ICD upgrade at this time.   - If NSVT episodes become more frequent or bothersome, it would be reasonable to try increasing the very low dose metoprolol further.    3) Stroke prevention:  - Her CHA2DS2-VASc score is 1, for gender only (but will be 2 when she turns 33 in March 2022).  This corresponds to a yearly stroke risk of 1.5% with no  therapy, 1.2% with aspirin alone, and 0.3-0.5% on therapeutic anticoagulation.  - She stopped apixaban 3 months post-procedure, in part given her preference as someone who enjoys riding horses.  Since we are able to monitor for atrial fibrillation via the device, and her only risk factors will be age and gender, I think it will be safe to remain  off therapeutic anticoagulation for the time being.  We will have a low threshold to resume if she has recurrent atrial fibrillation.    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  Her ventricular pacing burden has remained low overall.  - She will continue routine pacemaker checks remotely and in the clinic.    5) Possible sinus node dysfunction:  - She may have some degree of chronotropic incompetence that contributes to exertional dyspnea, but felt poorly with rate-response enabled.  We could consider another trial of rate-response with less aggressive settings in the future.      It is a pleasure participating in [name]'s care. I spent a total of 35 minutes on her care on the day of her visit excluding time spent related to any billed procedures. This time includes face-to-face time with the patient as well as time spent documenting in the medical record, reviewing the patient's records and tests, obtaining history, placing orders, communicating with other healthcare professionals, counseling the patient, family, or caregiver, and/or care coordination for the diagnoses above.    Please do not hesitate to contact me with any questions or concerns.    Estrellita Ludwig. Manus Rudd, MD, [facility], [facility]  Associate Professor of Clinical Medicine  Division of Cardiology  Cardiac Electrophysiology

## 2020-08-22 MED FILL — LISINOPRIL 30 MG TABS: 30 | 90 days supply | Qty: 90 | Fill #1

## 2020-08-22 MED FILL — AMLODIPINE BESYLATE 10 MG T: 10 | 90 days supply | Qty: 90 | Fill #3

## 2020-09-07 NOTE — Telephone Encounter (Signed)
Opened in error

## 2020-09-28 ENCOUNTER — Other Ambulatory Visit (HOSPITAL_COMMUNITY): Payer: Self-pay

## 2020-09-28 MED FILL — Metformin HCl Tab 1000 MG: ORAL | 90 days supply | Qty: 180 | Fill #0 | Status: AC

## 2020-10-24 ENCOUNTER — Other Ambulatory Visit (HOSPITAL_COMMUNITY): Payer: Self-pay

## 2020-10-24 MED FILL — Rosuvastatin Calcium Tab 5 MG: ORAL | 90 days supply | Qty: 45 | Fill #0 | Status: AC

## 2020-10-24 MED FILL — Levetiracetam Tab 500 MG: ORAL | 90 days supply | Qty: 180 | Fill #0 | Status: AC

## 2020-10-24 MED FILL — Ezetimibe Tab 10 MG: ORAL | 90 days supply | Qty: 90 | Fill #0 | Status: AC

## 2020-11-07 ENCOUNTER — Ambulatory Visit: Payer: 59 | Admitting: Nurse Practitioner

## 2020-11-22 ENCOUNTER — Inpatient Hospital Stay: Admit: 2020-11-22 | Discharge: 2020-11-22 | Payer: MEDICARE | Primary: Physician

## 2020-11-22 DIAGNOSIS — I480 Paroxysmal atrial fibrillation: Secondary | ICD-10-CM

## 2020-11-22 DIAGNOSIS — I4891 Unspecified atrial fibrillation: Secondary | ICD-10-CM

## 2020-11-24 ENCOUNTER — Other Ambulatory Visit: Payer: Self-pay | Admitting: Nurse Practitioner

## 2020-11-24 ENCOUNTER — Other Ambulatory Visit (HOSPITAL_COMMUNITY): Payer: Self-pay

## 2020-11-24 DIAGNOSIS — I10 Essential (primary) hypertension: Secondary | ICD-10-CM

## 2020-11-24 MED ORDER — LISINOPRIL 30 MG PO TABS
30.0000 mg | ORAL_TABLET | Freq: Every day | ORAL | 1 refills | Status: DC
  Filled 2020-11-24: qty 90, 90d supply, fill #0
  Filled 2021-02-20: qty 90, 90d supply, fill #1

## 2020-11-24 MED ORDER — AMLODIPINE BESYLATE 10 MG PO TABS
10.0000 mg | ORAL_TABLET | Freq: Every day | ORAL | 3 refills | Status: DC
  Filled 2020-11-24: qty 90, 90d supply, fill #0
  Filled 2021-02-20: qty 90, 90d supply, fill #1

## 2020-12-12 ENCOUNTER — Encounter: Payer: Self-pay | Admitting: Nurse Practitioner

## 2020-12-12 ENCOUNTER — Other Ambulatory Visit: Payer: Self-pay

## 2020-12-12 ENCOUNTER — Ambulatory Visit: Payer: 59 | Admitting: Nurse Practitioner

## 2020-12-12 VITALS — BP 122/88 | HR 83 | Temp 96.8°F | Ht 64.0 in | Wt 160.2 lb

## 2020-12-12 DIAGNOSIS — E119 Type 2 diabetes mellitus without complications: Secondary | ICD-10-CM

## 2020-12-12 DIAGNOSIS — E1169 Type 2 diabetes mellitus with other specified complication: Secondary | ICD-10-CM

## 2020-12-12 DIAGNOSIS — Z1231 Encounter for screening mammogram for malignant neoplasm of breast: Secondary | ICD-10-CM

## 2020-12-12 DIAGNOSIS — G40909 Epilepsy, unspecified, not intractable, without status epilepticus: Secondary | ICD-10-CM

## 2020-12-12 DIAGNOSIS — D509 Iron deficiency anemia, unspecified: Secondary | ICD-10-CM

## 2020-12-12 DIAGNOSIS — E785 Hyperlipidemia, unspecified: Secondary | ICD-10-CM

## 2020-12-12 DIAGNOSIS — I10 Essential (primary) hypertension: Secondary | ICD-10-CM | POA: Diagnosis not present

## 2020-12-12 DIAGNOSIS — M1711 Unilateral primary osteoarthritis, right knee: Secondary | ICD-10-CM

## 2020-12-12 DIAGNOSIS — E2839 Other primary ovarian failure: Secondary | ICD-10-CM

## 2020-12-12 NOTE — Patient Instructions (Signed)
TDAP, shingrix, COVID booster- to get at your local pharmacy.

## 2020-12-12 NOTE — Progress Notes (Signed)
Careteam: Patient Care Team: Lauree Chandler, NP as PCP - General (Geriatric Medicine)  PLACE OF SERVICE:  [place] Directive information Does Patient Have a Medical Advance Directive?: No, Would patient like information on creating a medical advance directive?: Yes (MAU/Ambulatory/Procedural Areas - Information given)  Allergies  Allergen Reactions   Simvastatin Other (See Comments)    Stomach pain, low back pain    Shrimp [Shellfish Allergy] Other (See Comments)    Swelling and hypersensitivity    Chief Complaint  Patient presents with   Medical Management of Chronic Issues    6 month follow-up      HPI: Patient is a 65 y.o. female for routine follow up.  Worked all day and fasting.  Has drank water throughout the day.   Dm- no low blood sugars.   Anemia- no signs of blood loss, takes iron tablets and stools are dark  OA- was taking tylenol but stopped. Taking collagen with her coffee. She feels better with joints.   Seizures-  no recent seizures, continues on keppra.   Hyperlipidemia- continues on ezetimibe   Htn- lisinopril and norvasc.  Review of Systems:  Review of Systems  Constitutional:  Negative for chills, fever and weight loss.  HENT:  Negative for tinnitus.   Respiratory:  Negative for cough, sputum production and shortness of breath.   Cardiovascular:  Negative for chest pain, palpitations and leg swelling.  Gastrointestinal:  Negative for abdominal pain, constipation, diarrhea and heartburn.  Genitourinary:  Negative for dysuria, frequency and urgency.  Musculoskeletal:  Negative for back pain, falls, joint pain and myalgias.  Skin: Negative.   Neurological:  Negative for dizziness and headaches.  Psychiatric/Behavioral:  Negative for depression and memory loss. The patient does not have insomnia.    Past Medical History:  Diagnosis Date   Allergy    Anemia, unspecified    Asthma    as a child, no problem as adult, no  inhaler   Bradycardia    past hx    Diabetes mellitus    Type II   History of blood transfusion    pre hysterectomy   Osteoarthrosis, unspecified whether generalized or localized, unspecified site    Other and unspecified hyperlipidemia    Other convulsions    last one 2013   Other malaise and fatigue    Other specified cardiac dysrhythmias(427.89) 2006   "history of Bradycardia" .  wore a mointor nothing found   Seizure disorder ([place])    last seizure 2013 per pt 05-12-2019 PV    Unspecified essential hypertension    Unspecified vitamin D deficiency    Past Surgical History:  Procedure Laterality Date   ABDOMINAL HYSTERECTOMY     APPENDECTOMY     BREAST LUMPECTOMY Right 1997   BREAST LUMPECTOMY Bilateral    neg for cancer   CHOLECYSTECTOMY N/A 02/28/2016   Procedure: LAPAROSCOPIC CHOLECYSTECTOMY;  Surgeon: Mickeal Skinner, MD;  Location: [place];  Service: General;  Laterality: N/A;   IR CATHETER TUBE CHANGE  01/02/2018   IR RADIOLOGIST EVAL & MGMT  12/25/2017   IR RADIOLOGIST EVAL & MGMT  01/08/2018   LAPAROSCOPIC APPENDECTOMY N/A 01/15/2018   Procedure: LAPAROSCOPIC APPENDECTOMY CONVERTED TO OPEN APPENDECTOMY SMALL BOWEL RESECTION;  Surgeon: Donnie Mesa, MD;  Location: [place];  Service: General;  Laterality: N/A;   SUPRA-UMBILICAL HERNIA N/A 9/35/7017   Procedure: OPEN REPAIR OF SUPRA-UMBILICAL VENTRAL HERNIA WITH MESH;  Surgeon: Donnie Mesa, MD;  Location: [place];  Service: General;  Laterality: N/A;   Social History:   reports that she has never smoked. She has never used smokeless tobacco. She reports that she does not drink alcohol and does not use drugs.  Family History  Problem Relation Age of Onset   Diabetes Father    Kidney failure Father    Heart disease Father    Hypertension Father    Seizures Father    Colon polyps Neg Hx    Colon cancer Neg Hx    Rectal cancer Neg Hx    Stomach cancer Neg Hx     Medications: Patient's Medications  New  Prescriptions   No medications on file  Previous Medications   AMLODIPINE (NORVASC) 10 MG TABLET    Take 1 tablet (10 mg total) by mouth daily.   BIOTIN PO    Take 1 tablet by mouth daily.   BLOOD GLUCOSE MONITORING SUPPL (FREESTYLE LITE) DEVI    Use to test blood sugar twice daily. Dx: E11.9   CETIRIZINE (ZYRTEC) 10 MG TABLET    Take 10 mg by mouth at bedtime.   COLLAGEN PO    Take by mouth daily. Powder formula, adds to coffee   EZETIMIBE (ZETIA) 10 MG TABLET    TAKE 1 TABLET BY MOUTH ONCE DAILY.   FERROUS SULFATE 325 (65 FE) MG TABLET    Take 1 tablet (325 mg total) by mouth 2 (two) times daily with a meal.   GLUCOSE BLOOD (FREESTYLE LITE) TEST STRIP    Use to test blood sugar twice daily. Dx: E11.9   GLUCOSE BLOOD TEST STRIP    Use as instructed   LANCETS (FREESTYLE) LANCETS    Use to test blood sugar twice daily. Dx: E11.9   LEVETIRACETAM (KEPPRA) 500 MG TABLET    TAKE 1 TABLET BY MOUTH EVERY 12 HOURS FOR SEIZURES   LISINOPRIL (ZESTRIL) 30 MG TABLET    Take 1 tablet (30 mg total) by mouth daily for blood pressure.   METFORMIN (GLUCOPHAGE) 1000 MG TABLET    TAKE 1 TABLET BY MOUTH TWICE DAILY WITH MEALS TO CONTROL BLOOD SUGAR.   ROSUVASTATIN (CRESTOR) 5 MG TABLET    TAKE 1/2 TABLET BY MOUTH ONCE A DAY AT BEDTIME  Modified Medications   No medications on file  Discontinued Medications   No medications on file    Physical Exam:  Vitals:   12/12/20 1525  BP: 122/88  Pulse: 83  Temp: (!) 96.8 F (36 C)  TempSrc: Temporal  SpO2: 98%  Weight: 160 lb 3.2 oz (72.7 kg)  Height: 5' 4"  (1.626 m)   Body mass index is 27.5 kg/m. Wt Readings from Last 3 Encounters:  12/12/20 160 lb 3.2 oz (72.7 kg)  05/09/20 159 lb (72.1 kg)  12/10/19 168 lb 7 oz (76.4 kg)    Physical Exam Constitutional:      General: She is not in acute distress.    Appearance: She is well-developed. She is not diaphoretic.  HENT:     Head: Normocephalic and atraumatic.     Mouth/Throat:     Pharynx: No  oropharyngeal exudate.  Eyes:     Conjunctiva/sclera: Conjunctivae normal.     Pupils: Pupils are equal, round, and reactive to light.  Cardiovascular:     Rate and Rhythm: Normal rate and regular rhythm.     Heart sounds: Normal heart sounds.  Pulmonary:     Effort: Pulmonary effort is normal.     Breath sounds: Normal breath sounds.  Abdominal:     General: Bowel sounds are normal.     Palpations: Abdomen is soft.  Musculoskeletal:     Cervical back: Normal range of motion and neck supple.     Right lower leg: No edema.     Left lower leg: No edema.  Skin:    General: Skin is warm and dry.  Neurological:     Mental Status: She is alert.  Psychiatric:        Mood and Affect: Mood normal.   Labs reviewed: Basic Metabolic Panel: Recent Labs    05/09/20 1616  NA 141  K 3.8  CL 107  CO2 23  GLUCOSE 111*  BUN 15  CREATININE 0.82  CALCIUM 9.5   Liver Function Tests: Recent Labs    05/09/20 1616  AST 14  ALT 12  BILITOT 0.4  PROT 6.8   No results for input(s): LIPASE, AMYLASE in the last 8760 hours. No results for input(s): AMMONIA in the last 8760 hours. CBC: Recent Labs    05/09/20 1616 06/13/20 1550  WBC 5.9 7.5  NEUTROABS 2,696  --   HGB 11.5* 11.4*  HCT 35.4 34.9*  MCV 81.4 82.3  PLT 308 292   Lipid Panel: No results for input(s): CHOL, HDL, LDLCALC, TRIG, CHOLHDL, LDLDIRECT in the last 8760 hours. TSH: No results for input(s): TSH in the last 8760 hours. A1C: Lab Results  Component Value Date   HGBA1C 6.8 (H) 05/09/2020     Assessment/Plan 1. Controlled type 2 diabetes mellitus without complication, without long-term current use of insulin ([place]) -Encouraged dietary compliance, routine foot care/monitoring and to keep up with diabetic eye exams through ophthalmology  -continues on metformin twice daily. No hypoglycemia.  - CMP with eGFR([facility]) - Hemoglobin A1c  2. Essential hypertension -well controlled on current regimen with dietary  modifications.  - CBC with Differential/Platelet - CMP with eGFR([facility])  3. Hyperlipidemia associated with type 2 diabetes mellitus ([place]) -continues on zetia, goal LDL <70. Continue dietary modifications.  - CMP with eGFR([facility]) - Lipid panel  4. Seizure disorder ([facility]) Stable, no recent seizures on keppra   5. Iron deficiency anemia, unspecified iron deficiency anemia type -no signs of blood loss, continues on iron supplement.   6. Primary osteoarthritis of right knee Stable at this time.   7. Estrogen deficiency - DG Bone Density; Future  8. Screening mammogram for breast cancer - MM Digital Screening; Future   Next appt:  Carlos American. [place], [facility] [phone]

## 2020-12-13 ENCOUNTER — Telehealth: Payer: Self-pay | Admitting: *Deleted

## 2020-12-13 LAB — COMPLETE METABOLIC PANEL WITH GFR
AG Ratio: 1.5 (calc) (ref 1.0–2.5)
ALT: 18 U/L (ref 6–29)
AST: 19 U/L (ref 10–35)
Albumin: 4.3 g/dL (ref 3.6–5.1)
Alkaline phosphatase (APISO): 82 U/L (ref 37–153)
BUN: 12 mg/dL (ref 7–25)
CO2: 26 mmol/L (ref 20–32)
Calcium: 9.3 mg/dL (ref 8.6–10.4)
Chloride: 107 mmol/L (ref 98–110)
Creat: 0.78 mg/dL (ref 0.50–0.99)
GFR, Est African American: 92 mL/min/{1.73_m2} (ref >=60)
GFR, Est Non African American: 80 mL/min/{1.73_m2} (ref >=60)
Globulin: 2.8 g/dL (calc) (ref 1.9–3.7)
Glucose, Bld: 112 mg/dL — ABNORMAL HIGH (ref 65–99)
Potassium: 3.7 mmol/L (ref 3.5–5.3)
Sodium: 141 mmol/L (ref 135–146)
Total Bilirubin: 0.4 mg/dL (ref 0.2–1.2)
Total Protein: 7.1 g/dL (ref 6.1–8.1)

## 2020-12-13 LAB — CBC WITH DIFFERENTIAL/PLATELET
Absolute Monocytes: 453 cells/uL (ref 200–950)
Basophils Absolute: 58 cells/uL (ref 0–200)
Basophils Relative: 0.8 %
Eosinophils Absolute: 329 cells/uL (ref 15–500)
Eosinophils Relative: 4.5 %
HCT: 37.2 % (ref 35.0–45.0)
Hemoglobin: 11.9 g/dL (ref 11.7–15.5)
Lymphs Abs: 2730 cells/uL (ref 850–3900)
MCH: 26 pg — ABNORMAL LOW (ref 27.0–33.0)
MCHC: 32 g/dL (ref 32.0–36.0)
MCV: 81.4 fL (ref 80.0–100.0)
MPV: 9.7 fL (ref 7.5–12.5)
Monocytes Relative: 6.2 %
Neutro Abs: 3730 cells/uL (ref 1500–7800)
Neutrophils Relative %: 51.1 %
Platelets: 317 10*3/uL (ref 140–400)
RBC: 4.57 10*6/uL (ref 3.80–5.10)
RDW: 13.7 % (ref 11.0–15.0)
Total Lymphocyte: 37.4 %
WBC: 7.3 10*3/uL (ref 3.8–10.8)

## 2020-12-13 LAB — LIPID PANEL
Cholesterol: 140 mg/dL (ref <200)
HDL: 71 mg/dL (ref >=50)
LDL Cholesterol (Calc): 50 mg/dL (calc)
Non-HDL Cholesterol (Calc): 69 mg/dL (calc) (ref <130)
Total CHOL/HDL Ratio: 2 (calc) (ref <5.0)
Triglycerides: 105 mg/dL (ref <150)

## 2020-12-13 LAB — HEMOGLOBIN A1C
Hgb A1c MFr Bld: 6.7 % of total Hgb — ABNORMAL HIGH (ref <5.7)
Mean Plasma Glucose: 146 mg/dL
eAG (mmol/L): 8.1 mmol/L

## 2020-12-13 NOTE — Telephone Encounter (Signed)
Patient called and stated that she was seen yesterday and Janett Billow asked her if she had any Vaginal bleeding and she stated that she hasn't.  Patient stated that she had some this morning. Stated that it was just alittle bright red blood on her pad this morning. Just happened once, hasn't noticed anymore.  Stated that she has had a hysterectomy many years ago.  Has no GYN Provider. Has gotten all of her stuff done here at the office.   Please Advise.

## 2020-12-13 NOTE — Telephone Encounter (Signed)
LMOM to return call.

## 2020-12-13 NOTE — Telephone Encounter (Signed)
Patient notified and agreed.  

## 2020-12-13 NOTE — Telephone Encounter (Signed)
Is she sure it was vaginal? hemorrhoids? Would monitor if ongoing needs to be evaluated in office.

## 2021-01-02 ENCOUNTER — Other Ambulatory Visit: Payer: Self-pay | Admitting: Nurse Practitioner

## 2021-01-02 ENCOUNTER — Other Ambulatory Visit (HOSPITAL_COMMUNITY): Payer: Self-pay

## 2021-01-02 MED ORDER — METFORMIN HCL 1000 MG PO TABS
1000.0000 mg | ORAL_TABLET | Freq: Two times a day (BID) | ORAL | 1 refills | Status: DC
  Filled 2021-01-02: qty 180, 90d supply, fill #0

## 2021-01-16 ENCOUNTER — Other Ambulatory Visit: Payer: Self-pay | Admitting: Nurse Practitioner

## 2021-01-16 ENCOUNTER — Other Ambulatory Visit (HOSPITAL_COMMUNITY): Payer: Self-pay

## 2021-01-16 DIAGNOSIS — E785 Hyperlipidemia, unspecified: Secondary | ICD-10-CM

## 2021-01-16 MED ORDER — ROSUVASTATIN CALCIUM 5 MG PO TABS
2.5000 mg | ORAL_TABLET | Freq: Every day | ORAL | 1 refills | Status: DC
  Filled 2021-01-16: qty 45, 90d supply, fill #0

## 2021-01-16 MED ORDER — LEVETIRACETAM 500 MG PO TABS
500.0000 mg | ORAL_TABLET | Freq: Two times a day (BID) | ORAL | 1 refills | Status: DC
  Filled 2021-01-16: qty 105, 52d supply, fill #0
  Filled 2021-01-16: qty 75, 38d supply, fill #0

## 2021-01-16 NOTE — Telephone Encounter (Signed)
Patient has request refill on medications 'Keppra" and "Crestor". Patient medication has Allergy Contraindications. Medication pend and sent to Marlowe Sax, NP due to PCP Dewaine Oats Carlos American, NP being out of office. Please Advise.

## 2021-02-03 ENCOUNTER — Other Ambulatory Visit (HOSPITAL_COMMUNITY): Payer: Self-pay

## 2021-02-03 MED ORDER — ZOSTER VAC RECOMB ADJUVANTED 50 MCG/0.5ML IM SUSR
0.5000 mL | Freq: Every day | INTRAMUSCULAR | 1 refills | Status: DC
  Filled 2021-02-03 (×2): qty 1, 1d supply, fill #0
  Filled 2021-04-17 – 2021-07-26 (×2): qty 1, 1d supply, fill #1
  Filled ????-??-??: fill #0

## 2021-02-06 ENCOUNTER — Other Ambulatory Visit (HOSPITAL_COMMUNITY): Payer: Self-pay

## 2021-02-07 NOTE — Progress Notes (Signed)
Pt reported DOE to [employer]. Pt to obtain ECHO locally.

## 2021-02-10 ENCOUNTER — Other Ambulatory Visit (HOSPITAL_COMMUNITY): Payer: Self-pay

## 2021-02-20 ENCOUNTER — Other Ambulatory Visit: Payer: Self-pay | Admitting: Nurse Practitioner

## 2021-02-20 ENCOUNTER — Other Ambulatory Visit (HOSPITAL_COMMUNITY): Payer: Self-pay

## 2021-02-20 DIAGNOSIS — E785 Hyperlipidemia, unspecified: Secondary | ICD-10-CM

## 2021-02-20 DIAGNOSIS — E1169 Type 2 diabetes mellitus with other specified complication: Secondary | ICD-10-CM

## 2021-02-20 MED ORDER — EZETIMIBE 10 MG PO TABS
10.0000 mg | ORAL_TABLET | Freq: Every day | ORAL | 1 refills | Status: DC
  Filled 2021-02-20: qty 90, 90d supply, fill #0

## 2021-03-13 ENCOUNTER — Other Ambulatory Visit (HOSPITAL_COMMUNITY): Payer: Self-pay

## 2021-04-11 ENCOUNTER — Other Ambulatory Visit: Payer: Self-pay

## 2021-04-11 NOTE — Telephone Encounter (Signed)
Incoming call received from patient stating she retired from [employer] and has a new insurance carrier which also mean she will get prescriptions through a new pharmacy.  Patient asked that I remove [facility] from her account and add [facility] [address] as her local pharmacy/ Patient was not sure who her mail order company would be yet she stated, it is whichever [employer] is associated with [employer]  Patient stated she did not need any refills at this time and is under the impression that the [place] will send request to [place] when needed.   Patients call was transferred to [facility] ([employer]) to [employer].

## 2021-04-13 ENCOUNTER — Other Ambulatory Visit: Payer: Self-pay | Admitting: *Deleted

## 2021-04-13 DIAGNOSIS — I10 Essential (primary) hypertension: Secondary | ICD-10-CM

## 2021-04-13 DIAGNOSIS — E785 Hyperlipidemia, unspecified: Secondary | ICD-10-CM

## 2021-04-13 DIAGNOSIS — E1169 Type 2 diabetes mellitus with other specified complication: Secondary | ICD-10-CM

## 2021-04-13 MED ORDER — LEVETIRACETAM 500 MG PO TABS: 500.0000 mg | ORAL_TABLET | Freq: Two times a day (BID) | ORAL | 0 refills | Status: DC

## 2021-04-13 MED ORDER — EZETIMIBE 10 MG PO TABS: 10.0000 mg | ORAL_TABLET | Freq: Every day | ORAL | 0 refills | Status: DC

## 2021-04-13 MED ORDER — METFORMIN HCL 1000 MG PO TABS: 1000.0000 mg | ORAL_TABLET | Freq: Two times a day (BID) | ORAL | 0 refills | Status: DC

## 2021-04-13 MED ORDER — LISINOPRIL 30 MG PO TABS
30.0000 mg | ORAL_TABLET | Freq: Every day | ORAL | 0 refills | Status: DC
Start: 2021-04-13 — End: 2021-07-03

## 2021-04-13 MED ORDER — AMLODIPINE BESYLATE 10 MG PO TABS
10.0000 mg | ORAL_TABLET | Freq: Every day | ORAL | 0 refills | Status: DC
Start: 2021-04-13 — End: 2021-07-03

## 2021-04-13 NOTE — Telephone Encounter (Signed)
[facility] pharmacy requested refill.

## 2021-04-13 NOTE — Addendum Note (Signed)
Addended by: Rafael Bihari A on: 04/13/2021 10:27 AM   Modules accepted: Orders

## 2021-04-17 ENCOUNTER — Other Ambulatory Visit (HOSPITAL_COMMUNITY): Payer: Self-pay

## 2021-04-19 NOTE — Telephone Encounter (Signed)
Pt called to ask for the reason for in person device check as she lives far.    Informed pt that all her AF events were oversensing. Settings need to be adjusted in person.    Pt verbalized understanding and willing to schedule the appt.    Message sent to [facility] to call pt for scheduling.

## 2021-04-25 ENCOUNTER — Other Ambulatory Visit: Payer: Self-pay | Admitting: *Deleted

## 2021-04-25 MED ORDER — METFORMIN HCL 1000 MG PO TABS: 1000.0000 mg | ORAL_TABLET | Freq: Two times a day (BID) | ORAL | 0 refills | Status: DC

## 2021-04-25 MED ORDER — LEVETIRACETAM 500 MG PO TABS: 500.0000 mg | ORAL_TABLET | Freq: Two times a day (BID) | ORAL | 0 refills | Status: DC

## 2021-04-25 NOTE — Telephone Encounter (Signed)
Patient called requesting Local supply of Rx's to be sent to pharmacy until she can receive through mail order.   Appointment scheduled for Follow up 11/7 with [facility].

## 2021-05-01 ENCOUNTER — Encounter: Payer: Self-pay | Admitting: Nurse Practitioner

## 2021-05-01 ENCOUNTER — Ambulatory Visit (INDEPENDENT_AMBULATORY_CARE_PROVIDER_SITE_OTHER): Payer: 59 | Admitting: Nurse Practitioner

## 2021-05-01 ENCOUNTER — Other Ambulatory Visit: Payer: Self-pay

## 2021-05-01 VITALS — BP 128/90 | HR 100 | Temp 97.1°F | Ht 63.5 in | Wt 164.0 lb

## 2021-05-01 DIAGNOSIS — E1169 Type 2 diabetes mellitus with other specified complication: Secondary | ICD-10-CM | POA: Diagnosis not present

## 2021-05-01 DIAGNOSIS — D509 Iron deficiency anemia, unspecified: Secondary | ICD-10-CM

## 2021-05-01 DIAGNOSIS — I10 Essential (primary) hypertension: Secondary | ICD-10-CM | POA: Diagnosis not present

## 2021-05-01 DIAGNOSIS — E119 Type 2 diabetes mellitus without complications: Secondary | ICD-10-CM | POA: Diagnosis not present

## 2021-05-01 DIAGNOSIS — G40909 Epilepsy, unspecified, not intractable, without status epilepticus: Secondary | ICD-10-CM

## 2021-05-01 DIAGNOSIS — E785 Hyperlipidemia, unspecified: Secondary | ICD-10-CM

## 2021-05-01 NOTE — Progress Notes (Deleted)
Provider: Lauree Chandler, NP  Patient Care Team: Lauree Chandler, NP as PCP - General (Geriatric Medicine)  Extended Emergency Contact Information Primary Emergency Contact: Thomas,Michael Address: [address]          [place], [place] [address] [place] Phone: [phone] Mobile Phone: [phone] Relation: Son Secondary Emergency Contact: [facility] Address: [address]          [place], [place] [address] [place] Phone: [phone] Mobile Phone: [phone] Relation: Relative Allergies  Allergen Reactions   Simvastatin Other (See Comments)    Stomach pain, low back pain    Shrimp [Shellfish Allergy] Other (See Comments)    Swelling and hypersensitivity   Code Status: full  Goals of Care: Advanced Directive information Advanced Directives 12/12/2020  Does Patient Have a Medical Advance Directive? No  Type of Advance Directive -  Does patient want to make changes to medical advance directive? -  Copy of [place] in Chart? -  Would patient like information on creating a medical advance directive? Yes (MAU/Ambulatory/Procedural Areas - Information given)     Chief Complaint  Patient presents with   Annual Exam    Annual exam. Foot exam today. Discuss need for td/tdap, covid #4, and shingles vaccine.     HPI: Patient is a 65 y.o. female seen in today for an wellness exam at ***  Diet?***  Exercise?***    Dentition:***  Ophthalmology appt: ***  Routine specialist: ***   Depression screen [facility] 2/9 11/04/2019 12/31/2018 10/08/2018 06/20/2018 12/25/2017  Decreased Interest 0 0 0 0 0  Down, Depressed, Hopeless 0 0 0 0 0  PHQ - 2 Score 0 0 0 0 0    Fall Risk 12/31/2018 11/04/2019 12/10/2019 05/09/2020 05/01/2021  Falls in the past year? 0 0 - 0 0  Was there an injury with Fall? - 0 - 0 0  Fall Risk Category Calculator - 0 - 0 0  Fall Risk Category - Low - Low Low  Patient Fall Risk Level Low  fall risk Low fall risk High fall risk Low fall risk Low fall risk  Patient at Risk for Falls Due to - - - - No Fall Risks  Fall risk Follow up - - - - Falls evaluation completed   No flowsheet data found.   Health Maintenance  Topic Date Due   OPHTHALMOLOGY EXAM  Never done   MAMMOGRAM  05/19/2011   TETANUS/TDAP  06/26/2019   FOOT EXAM  05/05/2020   COVID-19 Vaccine (4 - Booster for Pfizer series) 06/03/2020   DEXA SCAN  Never done   Zoster Vaccines- Shingrix (2 of 2) 04/07/2021   HEMOGLOBIN A1C  06/13/2021   Pneumonia Vaccine 68+ Years old (3 - PPSV23 if available, else PCV20) 09/26/2021   COLONOSCOPY (Pts 45-27yrs Insurance coverage will need to be confirmed)  05/27/2024   INFLUENZA VACCINE  Completed   Hepatitis C Screening  Completed   HIV Screening  Completed   HPV VACCINES  Aged Out   PAP SMEAR-Modifier  Discontinued    Past Medical History:  Diagnosis Date   Allergy    Anemia, unspecified    Asthma    as a child, no problem as adult, no inhaler   Bradycardia    past hx    Diabetes mellitus    Type II   History of blood transfusion    pre hysterectomy   Osteoarthrosis, unspecified whether generalized or localized, unspecified site  Other and unspecified hyperlipidemia    Other convulsions    last one 2013   Other malaise and fatigue    Other specified cardiac dysrhythmias(427.89) 2006   "history of Bradycardia" .  wore a mointor nothing found   Seizure disorder ([place])    last seizure 2013 per pt 05-12-2019 PV    Unspecified essential hypertension    Unspecified vitamin D deficiency     Past Surgical History:  Procedure Laterality Date   ABDOMINAL HYSTERECTOMY     APPENDECTOMY     BREAST LUMPECTOMY Right 1997   BREAST LUMPECTOMY Bilateral    neg for cancer   CHOLECYSTECTOMY N/A 02/28/2016   Procedure: LAPAROSCOPIC CHOLECYSTECTOMY;  Surgeon: Mickeal Skinner, MD;  Location: [place];  Service: General;  Laterality: N/A;   IR CATHETER TUBE CHANGE   01/02/2018   IR RADIOLOGIST EVAL & MGMT  12/25/2017   IR RADIOLOGIST EVAL & MGMT  01/08/2018   LAPAROSCOPIC APPENDECTOMY N/A 01/15/2018   Procedure: LAPAROSCOPIC APPENDECTOMY CONVERTED TO OPEN APPENDECTOMY SMALL BOWEL RESECTION;  Surgeon: Donnie Mesa, MD;  Location: [place];  Service: General;  Laterality: N/A;   SUPRA-UMBILICAL HERNIA N/A 2/63/7858   Procedure: OPEN REPAIR OF SUPRA-UMBILICAL VENTRAL HERNIA WITH MESH;  Surgeon: Donnie Mesa, MD;  Location: [place];  Service: General;  Laterality: N/A;    Social History   Socioeconomic History   Marital status: Married    Spouse name: Not on file   Number of children: Not on file   Years of education: Not on file   Highest education level: Not on file  Occupational History   Not on file  Tobacco Use   Smoking status: Never   Smokeless tobacco: Never  Vaping Use   Vaping Use: Never used  Substance and Sexual Activity   Alcohol use: No    Alcohol/week: 0.0 standard drinks   Drug use: No   Sexual activity: Not Currently  Other Topics Concern   Not on file  Social History Narrative   Not on file   Social Determinants of Health   Financial Resource Strain: Not on file  Food Insecurity: Not on file  Transportation Needs: Not on file  Physical Activity: Not on file  Stress: Not on file  Social Connections: Not on file    Family History  Problem Relation Age of Onset   Diabetes Father    Kidney failure Father    Heart disease Father    Hypertension Father    Seizures Father    Colon polyps Neg Hx    Colon cancer Neg Hx    Rectal cancer Neg Hx    Stomach cancer Neg Hx     Review of Systems:  ROS***   Allergies as of 05/01/2021       Reactions   Simvastatin Other (See Comments)   Stomach pain, low back pain    Shrimp [shellfish Allergy] Other (See Comments)   Swelling and hypersensitivity        Medication List        Accurate as of May 01, 2021  8:14 AM. If you have any questions, ask your nurse or  doctor.          amLODipine 10 MG tablet Commonly known as: NORVASC Take 1 tablet (10 mg total) by mouth daily.   BIOTIN PO Take 1 tablet by mouth daily.   cetirizine 10 MG tablet Commonly known as: ZYRTEC Take 10 mg by mouth at bedtime.   COLLAGEN PO Take by  mouth daily. Powder formula, adds to coffee   ezetimibe 10 MG tablet Commonly known as: ZETIA Take 1 tablet (10 mg total) by mouth daily.   ferrous sulfate 325 (65 FE) MG tablet Take 1 tablet (325 mg total) by mouth 2 (two) times daily with a meal.   freestyle lancets Use to test blood sugar twice daily. Dx: E11.9   FreeStyle Lite Devi Use to test blood sugar twice daily. Dx: E11.9   glucose blood test strip Use as instructed   FREESTYLE LITE test strip Generic drug: glucose blood Use to test blood sugar twice daily. Dx: E11.9   levETIRAcetam 500 MG tablet Commonly known as: KEPPRA Take 1 tablet (500 mg total) by mouth every 12 (twelve) hours for seizures   lisinopril 30 MG tablet Commonly known as: ZESTRIL Take 1 tablet (30 mg total) by mouth daily for blood pressure.   metFORMIN 1000 MG tablet Commonly known as: GLUCOPHAGE Take 1 tablet (1,000 mg total) by mouth 2 (two) times daily with meals to control blood sugar.   rosuvastatin 5 MG tablet Commonly known as: CRESTOR Take 0.5 tablets (2.5 mg total) by mouth at bedtime.   Shingrix injection Generic drug: Zoster Vaccine Adjuvanted Inject 0.5 mLs into the muscle daily.          Physical Exam: Vitals:   05/01/21 0810  Pulse: 100  Temp: (!) 97.1 F (36.2 C)  TempSrc: Temporal  SpO2: 97%  Weight: 164 lb (74.4 kg)  Height: 5' 3.5" (1.613 m)   Body mass index is 28.6 kg/m. Wt Readings from Last 3 Encounters:  05/01/21 164 lb (74.4 kg)  12/12/20 160 lb 3.2 oz (72.7 kg)  05/09/20 159 lb (72.1 kg)    Physical Exam***  Labs reviewed: Basic Metabolic Panel: Recent Labs    05/09/20 1616 12/12/20 1555  NA 141 141  K 3.8 3.7  CL  107 107  CO2 23 26  GLUCOSE 111* 112*  BUN 15 12  CREATININE 0.82 0.78  CALCIUM 9.5 9.3   Liver Function Tests: Recent Labs    05/09/20 1616 12/12/20 1555  AST 14 19  ALT 12 18  BILITOT 0.4 0.4  PROT 6.8 7.1   No results for input(s): LIPASE, AMYLASE in the last 8760 hours. No results for input(s): AMMONIA in the last 8760 hours. CBC: Recent Labs    05/09/20 1616 06/13/20 1550 12/12/20 1555  WBC 5.9 7.5 7.3  NEUTROABS 2,696  --  3,730  HGB 11.5* 11.4* 11.9  HCT 35.4 34.9* 37.2  MCV 81.4 82.3 81.4  PLT 308 292 317   Lipid Panel: Recent Labs    12/12/20 1555  CHOL 140  HDL 71  LDLCALC 50  TRIG 105  CHOLHDL 2.0   Lab Results  Component Value Date   HGBA1C 6.7 (H) 12/12/2020    Procedures: No results found.  Assessment/Plan There are no diagnoses linked to this encounter. ***  Next appt: *** Nickalas Mccarrick K. [place], [facility] [phone]

## 2021-05-01 NOTE — Patient Instructions (Signed)
Make sure to schedule mammogram and bone density- call [phone] to schedule   CONGRATULATIONS on your retirement :)

## 2021-05-01 NOTE — Progress Notes (Signed)
Careteam: Patient Care Team: Lauree Chandler, NP as PCP - General (Geriatric Medicine)  PLACE OF SERVICE:  [facility]  Advanced Directive information    Allergies  Allergen Reactions   Simvastatin Other (See Comments)    Stomach pain, low back pain    Shrimp [Shellfish Allergy] Other (See Comments)    Swelling and hypersensitivity    Chief Complaint  Patient presents with   Annual Exam    Annual exam. Foot exam today. Discuss need for td/tdap, covid #4, and shingles vaccine.      HPI: Patient is a 65 y.o. female for routine follow up. Has now retired. Doing part time work.    DM- does not check blood sugar. But felt like it was elevated last week. Taking metformin BID twice daily. She plans to starts exercising again.   Hyperlipidemia- controlled on crestor and zetia   Hypertension- controlled on norvasc   Anemia- continues on ferrous sulfate. No signs of blood loss.   Review of Systems:  Review of Systems  Constitutional:  Negative for chills, fever and weight loss.  HENT:  Negative for tinnitus.   Respiratory:  Negative for cough, sputum production and shortness of breath.   Cardiovascular:  Negative for chest pain, palpitations and leg swelling.  Gastrointestinal:  Negative for abdominal pain, constipation, diarrhea and heartburn.  Genitourinary:  Negative for dysuria, frequency and urgency.  Musculoskeletal:  Negative for back pain, falls, joint pain and myalgias.  Skin: Negative.   Neurological:  Negative for dizziness and headaches.  Psychiatric/Behavioral:  Negative for depression and memory loss. The patient does not have insomnia.    Past Medical History:  Diagnosis Date   Allergy    Anemia, unspecified    Asthma    as a child, no problem as adult, no inhaler   Bradycardia    past hx    Diabetes mellitus    Type II   History of blood transfusion    pre hysterectomy   Osteoarthrosis, unspecified whether generalized or localized, unspecified  site    Other and unspecified hyperlipidemia    Other convulsions    last one 2013   Other malaise and fatigue    Other specified cardiac dysrhythmias(427.89) 2006   "history of Bradycardia" .  wore a mointor nothing found   Seizure disorder ([place])    last seizure 2013 per pt 05-12-2019 PV    Unspecified essential hypertension    Unspecified vitamin D deficiency    Past Surgical History:  Procedure Laterality Date   ABDOMINAL HYSTERECTOMY     APPENDECTOMY     BREAST LUMPECTOMY Right 1997   BREAST LUMPECTOMY Bilateral    neg for cancer   CHOLECYSTECTOMY N/A 02/28/2016   Procedure: LAPAROSCOPIC CHOLECYSTECTOMY;  Surgeon: Mickeal Skinner, MD;  Location: [place];  Service: General;  Laterality: N/A;   IR CATHETER TUBE CHANGE  01/02/2018   IR RADIOLOGIST EVAL & MGMT  12/25/2017   IR RADIOLOGIST EVAL & MGMT  01/08/2018   LAPAROSCOPIC APPENDECTOMY N/A 01/15/2018   Procedure: LAPAROSCOPIC APPENDECTOMY CONVERTED TO OPEN APPENDECTOMY SMALL BOWEL RESECTION;  Surgeon: Donnie Mesa, MD;  Location: [place];  Service: General;  Laterality: N/A;   SUPRA-UMBILICAL HERNIA N/A 5/32/0233   Procedure: OPEN REPAIR OF SUPRA-UMBILICAL VENTRAL HERNIA WITH MESH;  Surgeon: Donnie Mesa, MD;  Location: [place];  Service: General;  Laterality: N/A;   Social History:   reports that she has never smoked. She has never used smokeless tobacco. She reports that  she does not drink alcohol and does not use drugs.  Family History  Problem Relation Age of Onset   Diabetes Father    Kidney failure Father    Heart disease Father    Hypertension Father    Seizures Father    Colon polyps Neg Hx    Colon cancer Neg Hx    Rectal cancer Neg Hx    Stomach cancer Neg Hx     Medications: Patient's Medications  New Prescriptions   No medications on file  Previous Medications   AMLODIPINE (NORVASC) 10 MG TABLET    Take 1 tablet (10 mg total) by mouth daily.   BIOTIN PO    Take 1 tablet by mouth daily.   BLOOD GLUCOSE  MONITORING SUPPL (FREESTYLE LITE) DEVI    Use to test blood sugar twice daily. Dx: E11.9   CETIRIZINE (ZYRTEC) 10 MG TABLET    Take 10 mg by mouth at bedtime.   COLLAGEN PO    Take by mouth daily. Powder formula, adds to coffee   EZETIMIBE (ZETIA) 10 MG TABLET    Take 1 tablet (10 mg total) by mouth daily.   FERROUS SULFATE 325 (65 FE) MG TABLET    Take 1 tablet (325 mg total) by mouth 2 (two) times daily with a meal.   GLUCOSE BLOOD (FREESTYLE LITE) TEST STRIP    Use to test blood sugar twice daily. Dx: E11.9   GLUCOSE BLOOD TEST STRIP    Use as instructed   LANCETS (FREESTYLE) LANCETS    Use to test blood sugar twice daily. Dx: E11.9   LEVETIRACETAM (KEPPRA) 500 MG TABLET    Take 1 tablet (500 mg total) by mouth every 12 (twelve) hours for seizures   LISINOPRIL (ZESTRIL) 30 MG TABLET    Take 1 tablet (30 mg total) by mouth daily for blood pressure.   METFORMIN (GLUCOPHAGE) 1000 MG TABLET    Take 1 tablet (1,000 mg total) by mouth 2 (two) times daily with meals to control blood sugar.   ROSUVASTATIN (CRESTOR) 5 MG TABLET    Take 0.5 tablets (2.5 mg total) by mouth at bedtime.  Modified Medications   No medications on file  Discontinued Medications   ZOSTER VACCINE ADJUVANTED (SHINGRIX) INJECTION    Inject 0.5 mLs into the muscle daily.    Physical Exam:  Vitals:   05/01/21 0810  BP: 128/90  Pulse: 100  Temp: (!) 97.1 F (36.2 C)  TempSrc: Temporal  SpO2: 97%  Weight: 164 lb (74.4 kg)  Height: 5' 3.5" (1.613 m)   Body mass index is 28.6 kg/m. Wt Readings from Last 3 Encounters:  05/01/21 164 lb (74.4 kg)  12/12/20 160 lb 3.2 oz (72.7 kg)  05/09/20 159 lb (72.1 kg)    Physical Exam Constitutional:      General: She is not in acute distress.    Appearance: She is well-developed. She is not diaphoretic.  HENT:     Head: Normocephalic and atraumatic.     Mouth/Throat:     Pharynx: No oropharyngeal exudate.  Eyes:     Conjunctiva/sclera: Conjunctivae normal.     Pupils:  Pupils are equal, round, and reactive to light.  Cardiovascular:     Rate and Rhythm: Normal rate and regular rhythm.     Heart sounds: Normal heart sounds.  Pulmonary:     Effort: Pulmonary effort is normal.     Breath sounds: Normal breath sounds.  Abdominal:     General: Bowel sounds  are normal.     Palpations: Abdomen is soft.  Musculoskeletal:     Cervical back: Normal range of motion and neck supple.     Right lower leg: No edema.     Left lower leg: No edema.  Skin:    General: Skin is warm and dry.  Neurological:     Mental Status: She is alert.  Psychiatric:        Mood and Affect: Mood normal.    Labs reviewed: Basic Metabolic Panel: Recent Labs    05/09/20 1616 12/12/20 1555  NA 141 141  K 3.8 3.7  CL 107 107  CO2 23 26  GLUCOSE 111* 112*  BUN 15 12  CREATININE 0.82 0.78  CALCIUM 9.5 9.3   Liver Function Tests: Recent Labs    05/09/20 1616 12/12/20 1555  AST 14 19  ALT 12 18  BILITOT 0.4 0.4  PROT 6.8 7.1   No results for input(s): LIPASE, AMYLASE in the last 8760 hours. No results for input(s): AMMONIA in the last 8760 hours. CBC: Recent Labs    05/09/20 1616 06/13/20 1550 12/12/20 1555  WBC 5.9 7.5 7.3  NEUTROABS 2,696  --  3,730  HGB 11.5* 11.4* 11.9  HCT 35.4 34.9* 37.2  MCV 81.4 82.3 81.4  PLT 308 292 317   Lipid Panel: Recent Labs    12/12/20 1555  CHOL 140  HDL 71  LDLCALC 50  TRIG 105  CHOLHDL 2.0   TSH: No results for input(s): TSH in the last 8760 hours. A1C: Lab Results  Component Value Date   HGBA1C 6.7 (H) 12/12/2020     Assessment/Plan 1. Controlled type 2 diabetes mellitus without complication, without long-term current use of insulin ([place]) -Encouraged dietary compliance, routine foot care/monitoring and to keep up with diabetic eye exams through ophthalmology  -continue on met - Hemoglobin A1c  2. Essential hypertension --stable. Goal bp <140/90. Continue on norvasc and lisinopril with low sodium diet.   - CBC with Differential/Platelet - CMP with eGFR([facility])  3. Hyperlipidemia associated with type 2 diabetes mellitus ([place]) -continue on crestor with zetia, LDL at goal.  - CMP with eGFR([facility])  4. Seizure disorder ([place]) -stable, no recent seizures.   5. Iron deficiency anemia, unspecified iron deficiency anemia type -continue on supplement. No signs of blood loss.  - CBC with Differential/Platelet  next appt: 6 month  Jianni Shelden K. [place], [facility] [phone]

## 2021-05-02 ENCOUNTER — Other Ambulatory Visit: Payer: Self-pay | Admitting: *Deleted

## 2021-05-02 ENCOUNTER — Other Ambulatory Visit: Payer: Self-pay | Admitting: Nurse Practitioner

## 2021-05-02 DIAGNOSIS — E785 Hyperlipidemia, unspecified: Secondary | ICD-10-CM

## 2021-05-02 DIAGNOSIS — E1169 Type 2 diabetes mellitus with other specified complication: Secondary | ICD-10-CM

## 2021-05-02 LAB — COMPLETE METABOLIC PANEL WITH GFR
AG Ratio: 1.5 (calc) (ref 1.0–2.5)
ALT: 15 U/L (ref 6–29)
AST: 14 U/L (ref 10–35)
Albumin: 4.4 g/dL (ref 3.6–5.1)
Alkaline phosphatase (APISO): 90 U/L (ref 37–153)
BUN: 13 mg/dL (ref 7–25)
CO2: 23 mmol/L (ref 20–32)
Calcium: 9.5 mg/dL (ref 8.6–10.4)
Chloride: 104 mmol/L (ref 98–110)
Creat: 0.95 mg/dL (ref 0.50–1.05)
Globulin: 2.9 g/dL (calc) (ref 1.9–3.7)
Glucose, Bld: 240 mg/dL — ABNORMAL HIGH (ref 65–99)
Potassium: 3.6 mmol/L (ref 3.5–5.3)
Sodium: 138 mmol/L (ref 135–146)
Total Bilirubin: 0.5 mg/dL (ref 0.2–1.2)
Total Protein: 7.3 g/dL (ref 6.1–8.1)
eGFR: 66 mL/min/{1.73_m2} (ref >=60)

## 2021-05-02 LAB — CBC WITH DIFFERENTIAL/PLATELET
Absolute Monocytes: 409 cells/uL (ref 200–950)
Basophils Absolute: 60 cells/uL (ref 0–200)
Basophils Relative: 0.9 %
Eosinophils Absolute: 308 cells/uL (ref 15–500)
Eosinophils Relative: 4.6 %
HCT: 38.5 % (ref 35.0–45.0)
Hemoglobin: 12.5 g/dL (ref 11.7–15.5)
Lymphs Abs: 2278 cells/uL (ref 850–3900)
MCH: 26.3 pg — ABNORMAL LOW (ref 27.0–33.0)
MCHC: 32.5 g/dL (ref 32.0–36.0)
MCV: 81.1 fL (ref 80.0–100.0)
MPV: 10.3 fL (ref 7.5–12.5)
Monocytes Relative: 6.1 %
Neutro Abs: 3645 cells/uL (ref 1500–7800)
Neutrophils Relative %: 54.4 %
Platelets: 300 10*3/uL (ref 140–400)
RBC: 4.75 10*6/uL (ref 3.80–5.10)
RDW: 13.7 % (ref 11.0–15.0)
Total Lymphocyte: 34 %
WBC: 6.7 10*3/uL (ref 3.8–10.8)

## 2021-05-02 LAB — HEMOGLOBIN A1C
Hgb A1c MFr Bld: 9.1 % of total Hgb — ABNORMAL HIGH (ref <5.7)
Mean Plasma Glucose: 214 mg/dL
eAG (mmol/L): 11.9 mmol/L

## 2021-05-02 MED ORDER — ROSUVASTATIN CALCIUM 5 MG PO TABS
2.5000 mg | ORAL_TABLET | Freq: Every day | ORAL | 1 refills | Status: DC
Start: 2021-05-02 — End: 2021-05-11

## 2021-05-02 MED ORDER — GLIPIZIDE 5 MG PO TABS: 5.0000 mg | ORAL_TABLET | Freq: Every day | ORAL | 1 refills | Status: DC

## 2021-05-02 NOTE — Telephone Encounter (Signed)
Patient also requested a refill on her Rosuvastatin.   Pended Rx and sent to [facility] for approval due to [place].

## 2021-05-02 NOTE — Telephone Encounter (Signed)
Patient notified and agreed. Patient stated that she just retired and knew her numbers would be up but stated that she is fixing to join a gym and work on it. Does not want a Nutritionist referral at this time.  To call in Glipizide to [facility] Appointment scheduled for 2/10 with Janett Billow and 2/8 for labs.     Rx already sent to pharmacy by Janett Billow.

## 2021-05-02 NOTE — Telephone Encounter (Signed)
-----   Message from Lauree Chandler, NP sent at 05/02/2021  8:38 AM EST ----- Glucose has gone up dramatically over the last 4 months. A1c now at 9.1!!! And fasting glucose was 240. Needs to make dietary modifications at this time. She needs to start exercising as discussed during OV.  Would recommend starting glipizide 5 mg by mouth daily at breakfast.  Continue eating 3 meals a day but to be mindful of the diabetic diet -- can place nutritionist referral if needed Lets follow up in 3 months with A1c and cmp lab prior to visit  Blood counts and electrolytes and kidney function normal.

## 2021-05-02 NOTE — Addendum Note (Signed)
Addended by: Rafael Bihari A on: 05/02/2021 10:50 AM   Modules accepted: Orders

## 2021-05-11 ENCOUNTER — Telehealth: Payer: Self-pay

## 2021-05-11 DIAGNOSIS — E785 Hyperlipidemia, unspecified: Secondary | ICD-10-CM

## 2021-05-11 MED ORDER — ROSUVASTATIN CALCIUM 5 MG PO TABS: 2.5000 mg | ORAL_TABLET | Freq: Every day | ORAL | 3 refills | Status: DC

## 2021-05-11 NOTE — Telephone Encounter (Signed)
Incoming fax from [facility] (mail order) for crestor. RX was recently filled at [facility]).   I called patient to seek clarity on which pharmacy patient is planning on using. Patient states she received RX from [facility] in the interim but going forward she would like received from [facility].   I called [facility], left detailed message requesting for them to cancel any pending refills for crestor and approved medication for mail order [facility]) per patient request

## 2021-05-29 ENCOUNTER — Other Ambulatory Visit (HOSPITAL_COMMUNITY): Payer: Self-pay

## 2021-05-30 ENCOUNTER — Ambulatory Visit: Admit: 2021-05-30 | Discharge: 2021-08-08 | Payer: MEDICARE | Primary: Physician

## 2021-05-30 DIAGNOSIS — Z45018 Encounter for adjustment and management of other part of cardiac pacemaker: Secondary | ICD-10-CM

## 2021-05-30 NOTE — Progress Notes (Signed)
Reprogramming: Atrial Pacing and sensing configuration changed to Bipolar and Atrial sensitivity increased to 0.17mV in an attempt to correct atrial oversensing.     Please see [place] for full report. Will be uploaded to scanned clinical docs when finalized

## 2021-06-12 ENCOUNTER — Other Ambulatory Visit: Payer: Self-pay

## 2021-06-12 ENCOUNTER — Encounter: Payer: Self-pay | Admitting: Nurse Practitioner

## 2021-06-12 ENCOUNTER — Ambulatory Visit (INDEPENDENT_AMBULATORY_CARE_PROVIDER_SITE_OTHER): Payer: Medicare Other | Admitting: Nurse Practitioner

## 2021-06-12 VITALS — BP 140/82 | HR 100 | Temp 97.5°F | Ht 63.03 in | Wt 172.2 lb

## 2021-06-12 DIAGNOSIS — Z0000 Encounter for general adult medical examination without abnormal findings: Secondary | ICD-10-CM

## 2021-06-12 NOTE — Progress Notes (Signed)
Subjective:    Sarah Brock is a 66 y.o. female who presents for a Welcome to Medicare exam.   Review of Systems  Cardiac Risk Factors include: obesity (BMI >30kg/m2);advanced age (>96men, >9 women);diabetes mellitus;hypertension;dyslipidemia      Objective:    Today's Vitals   06/12/21 1556  BP: 140/82  Pulse: 100  Temp: (!) 97.5 F (36.4 C)  TempSrc: Temporal  SpO2: 98%  Weight: 172 lb 3.2 oz (78.1 kg)  Height: 5' 3.03" (1.601 m)  Body mass index is 30.48 kg/m.  Medications Outpatient Encounter Medications as of 06/12/2021  Medication Sig   amLODipine (NORVASC) 10 MG tablet Take 1 tablet (10 mg total) by mouth daily.   BIOTIN PO Take 1 tablet by mouth daily.   cetirizine (ZYRTEC) 10 MG tablet Take 10 mg by mouth at bedtime.   COLLAGEN PO Take by mouth daily. Powder formula, adds to coffee   ezetimibe (ZETIA) 10 MG tablet Take 1 tablet (10 mg total) by mouth daily.   ferrous sulfate 325 (65 FE) MG tablet Take 1 tablet (325 mg total) by mouth 2 (two) times daily with a meal.   levETIRAcetam (KEPPRA) 500 MG tablet Take 1 tablet (500 mg total) by mouth every 12 (twelve) hours for seizures   lisinopril (ZESTRIL) 30 MG tablet Take 1 tablet (30 mg total) by mouth daily for blood pressure.   metFORMIN (GLUCOPHAGE) 1000 MG tablet Take 1 tablet (1,000 mg total) by mouth 2 (two) times daily with meals to control blood sugar.   rosuvastatin (CRESTOR) 5 MG tablet Take 0.5 tablets (2.5 mg total) by mouth at bedtime.   [DISCONTINUED] Blood Glucose Monitoring Suppl (FREESTYLE LITE) DEVI Use to test blood sugar twice daily. Dx: E11.9   [DISCONTINUED] glucose blood (FREESTYLE LITE) test strip Use to test blood sugar twice daily. Dx: E11.9   [DISCONTINUED] Lancets (FREESTYLE) lancets Use to test blood sugar twice daily. Dx: E11.9   [DISCONTINUED] glipiZIDE (GLUCOTROL) 5 MG tablet Take 1 tablet (5 mg total) by mouth daily before breakfast. (Patient not taking: Reported on 06/12/2021)    [DISCONTINUED] glucose blood test strip Use as instructed (Patient not taking: Reported on 06/12/2021)   No facility-administered encounter medications on file as of 06/12/2021.     History: Past Medical History:  Diagnosis Date   Allergy    Anemia, unspecified    Asthma    as a child, no problem as adult, no inhaler   Bradycardia    past hx    Diabetes mellitus    Type II   History of blood transfusion    pre hysterectomy   Osteoarthrosis, unspecified whether generalized or localized, unspecified site    Other and unspecified hyperlipidemia    Other convulsions    last one 2013   Other malaise and fatigue    Other specified cardiac dysrhythmias(427.89) 2006   "history of Bradycardia" .  wore a mointor nothing found   Seizure disorder ([place])    last seizure 2013 per pt 05-12-2019 PV    Unspecified essential hypertension    Unspecified vitamin D deficiency    Past Surgical History:  Procedure Laterality Date   ABDOMINAL HYSTERECTOMY     APPENDECTOMY     BREAST LUMPECTOMY Right 1997   BREAST LUMPECTOMY Bilateral    neg for cancer   CHOLECYSTECTOMY N/A 02/28/2016   Procedure: LAPAROSCOPIC CHOLECYSTECTOMY;  Surgeon: Mickeal Skinner, MD;  Location: [place];  Service: General;  Laterality: N/A;   IR CATHETER TUBE  CHANGE  01/02/2018   IR RADIOLOGIST EVAL & MGMT  12/25/2017   IR RADIOLOGIST EVAL & MGMT  01/08/2018   LAPAROSCOPIC APPENDECTOMY N/A 01/15/2018   Procedure: LAPAROSCOPIC APPENDECTOMY CONVERTED TO OPEN APPENDECTOMY SMALL BOWEL RESECTION;  Surgeon: Donnie Mesa, MD;  Location: [place];  Service: General;  Laterality: N/A;   SUPRA-UMBILICAL HERNIA N/A 3/66/2947   Procedure: OPEN REPAIR OF SUPRA-UMBILICAL VENTRAL HERNIA WITH MESH;  Surgeon: Donnie Mesa, MD;  Location: [facility];  Service: General;  Laterality: N/A;    Family History  Problem Relation Age of Onset   Diabetes Father    Kidney failure Father    Heart disease Father    Hypertension Father    Seizures  Father    Colon polyps Neg Hx    Colon cancer Neg Hx    Rectal cancer Neg Hx    Stomach cancer Neg Hx    Social History   Occupational History   Not on file  Tobacco Use   Smoking status: Never   Smokeless tobacco: Never  Vaping Use   Vaping Use: Never used  Substance and Sexual Activity   Alcohol use: No    Alcohol/week: 0.0 standard drinks   Drug use: No   Sexual activity: Not Currently    Tobacco Counseling Counseling given: Not Answered   Immunizations and Health Maintenance Immunization History  Administered Date(s) Administered   Influenza Split 03/31/2013   Influenza, High Dose Seasonal PF 04/16/2016   Influenza,inj,Quad PF,6+ Mos 03/20/2019, 03/25/2020, 04/05/2021   Influenza-Unspecified 03/26/2015, 03/25/2018   PFIZER(Purple Top)SARS-COV-2 Vaccination 06/27/2019, 07/17/2019, 04/08/2020   Pneumococcal Conjugate-13 07/09/2012, 05/19/2014   Pneumococcal Polysaccharide-23 09/26/2016   Tdap 06/25/2009   Zoster Recombinat (Shingrix) 02/10/2021   Health Maintenance Due  Topic Date Due   OPHTHALMOLOGY EXAM  Never done   MAMMOGRAM  05/19/2011   TETANUS/TDAP  06/26/2019   FOOT EXAM  05/05/2020   COVID-19 Vaccine (4 - Booster for [place] series) 06/03/2020   DEXA SCAN  Never done   Zoster Vaccines- Shingrix (2 of 2) 04/07/2021    Activities of Daily Living In your present state of health, do you have any difficulty performing the following activities: 06/12/2021  Hearing? N  Vision? Y  Difficulty concentrating or making decisions? Y  Comment occasionally  Walking or climbing stairs? Y  Comment occasionally if bone are stiff  Dressing or bathing? N  Doing errands, shopping? N  Preparing Food and eating ? N  Using the Toilet? N  In the past six months, have you accidently leaked urine? Y  Do you have problems with loss of bowel control? N  Managing your Medications? N  Managing your Finances? N  Housekeeping or managing your Housekeeping? N  Some recent  data might be hidden    Physical Exam  (optional), or other factors deemed appropriate based on the beneficiary's medical and social history and current clinical standards.  Advanced Directives: Does Patient Have a Medical Advance Directive?: No Would patient like information on creating a medical advance directive?: No - Patient declined    Assessment:    This is a routine wellness examination for this patient .   Vision/Hearing screen Vision Screening   Right eye Left eye Both eyes  Without correction 20/50 20/40 20/30   With correction     Hearing Screening - Comments:: Patients children states she has hearing issues per patient  Dietary issues and exercise activities discussed:  Current Exercise Habits: Home exercise routine, Type of exercise: treadmill;Other - see comments (  bicycle), Time (Minutes): 60, Frequency (Times/Week): 2, Weekly Exercise (Minutes/Week): 120   Goals   None    Depression Screen PHQ 2/9 Scores 06/12/2021 11/04/2019 12/31/2018 10/08/2018  PHQ - 2 Score 0 0 0 0     Fall Risk Fall Risk  06/12/2021  Falls in the past year? 0  Number falls in past yr: 0  Injury with Fall? 0  Risk for fall due to : No Fall Risks  Follow up Falls evaluation completed    Cognitive Function: MMSE - Mini Mental State Exam 06/12/2021  Orientation to time 5  Orientation to Place 5  Registration 3  Attention/ Calculation 5  Recall 2  Language- name 2 objects 2  Language- repeat 1  Language- follow 3 step command 3  Language- read & follow direction 1  Write a sentence 1  Copy design 1  Total score 29        Patient Care Team: Lauree Chandler, NP as PCP - General (Geriatric Medicine)     Plan:     I have personally reviewed and noted the following in the patients chart:   Medical and social history Use of alcohol, tobacco or illicit drugs  Current medications and supplements Functional ability and status Nutritional status Physical  activity Advanced directives List of other physicians Hospitalizations, surgeries, and ER visits in previous 12 months Vitals Screenings to include cognitive, depression, and falls Referrals and appointments  In addition, I have reviewed and discussed with patient certain preventive protocols, quality metrics, and best practice recommendations. A written personalized care plan for preventive services as well as general preventive health recommendations were provided to patient.     Lauree Chandler, NP 06/15/2021

## 2021-06-12 NOTE — Patient Instructions (Signed)
Sarah Brock , Thank you for taking time to come for your Medicare Wellness Visit. I appreciate your ongoing commitment to your health goals. Please review the following plan we discussed and let me know if I can assist you in the future.   Screening recommendations/referrals: Colonoscopy up to date Mammogram please call to schedule [phone] Bone Density please call to schedule [phone] Recommended yearly ophthalmology/optometry visit for glaucoma screening and checkup Recommended yearly dental visit for hygiene and checkup  Vaccinations: Influenza vaccine up to date Pneumococcal vaccine up to date Tdap vaccine RECOMMENDED- to get at local pharmacy Shingles vaccine RECOMMENDED- to get at local pharmacy   COVID booster- RECOMMENDED- to get at local pharmacy   Advanced directives: recommended to complete  Conditions/risks identified: complications related to diabetes   Next appointment: yearly for AWV    Preventive Care 28 Years and Older, Female Preventive care refers to lifestyle choices and visits with your health care provider that can promote health and wellness. What does preventive care include? A yearly physical exam. This is also called an annual well check. Dental exams once or twice a year. Routine eye exams. Ask your health care provider how often you should have your eyes checked. Personal lifestyle choices, including: Daily care of your teeth and gums. Regular physical activity. Eating a healthy diet. Avoiding tobacco and drug use. Limiting alcohol use. Practicing safe sex. Taking low-dose aspirin every day. Taking vitamin and mineral supplements as recommended by your health care provider. What happens during an annual well check? The services and screenings done by your health care provider during your annual well check will depend on your age, overall health, lifestyle risk factors, and family history of disease. Counseling  Your health care provider may  ask you questions about your: Alcohol use. Tobacco use. Drug use. Emotional well-being. Home and relationship well-being. Sexual activity. Eating habits. History of falls. Memory and ability to understand (cognition). Work and work Statistician. Reproductive health. Screening  You may have the following tests or measurements: Height, weight, and BMI. Blood pressure. Lipid and cholesterol levels. These may be checked every 5 years, or more frequently if you are over 91 years old. Skin check. Lung cancer screening. You may have this screening every year starting at age 58 if you have a 30-pack-year history of smoking and currently smoke or have quit within the past 15 years. Fecal occult blood test (FOBT) of the stool. You may have this test every year starting at age 34. Flexible sigmoidoscopy or colonoscopy. You may have a sigmoidoscopy every 5 years or a colonoscopy every 10 years starting at age 18. Hepatitis C blood test. Hepatitis B blood test. Sexually transmitted disease (STD) testing. Diabetes screening. This is done by checking your blood sugar (glucose) after you have not eaten for a while (fasting). You may have this done every 1-3 years. Bone density scan. This is done to screen for osteoporosis. You may have this done starting at age 75. Mammogram. This may be done every 1-2 years. Talk to your health care provider about how often you should have regular mammograms. Talk with your health care provider about your test results, treatment options, and if necessary, the need for more tests. Vaccines  Your health care provider may recommend certain vaccines, such as: Influenza vaccine. This is recommended every year. Tetanus, diphtheria, and acellular pertussis (Tdap, Td) vaccine. You may need a Td booster every 10 years. Zoster vaccine. You may need this after age 58. Pneumococcal 13-valent conjugate (PCV13)  vaccine. One dose is recommended after age 80. Pneumococcal  polysaccharide (PPSV23) vaccine. One dose is recommended after age 17. Talk to your health care provider about which screenings and vaccines you need and how often you need them. This information is not intended to replace advice given to you by your health care provider. Make sure you discuss any questions you have with your health care provider. Document Released: 07/08/2015 Document Revised: 02/29/2016 Document Reviewed: 04/12/2015 Elsevier Interactive Patient Education  2017 [employer] Prevention in the Home Falls can cause injuries. They can happen to people of all ages. There are many things you can do to make your home safe and to help prevent falls. What can I do on the outside of my home? Regularly fix the edges of walkways and driveways and fix any cracks. Remove anything that might make you trip as you walk through a door, such as a raised step or threshold. Trim any bushes or trees on the path to your home. Use bright outdoor lighting. Clear any walking paths of anything that might make someone trip, such as rocks or tools. Regularly check to see if handrails are loose or broken. Make sure that both sides of any steps have handrails. Any raised decks and porches should have guardrails on the edges. Have any leaves, snow, or ice cleared regularly. Use sand or salt on walking paths during winter. Clean up any spills in your garage right away. This includes oil or grease spills. What can I do in the bathroom? Use night lights. Install grab bars by the toilet and in the tub and shower. Do not use towel bars as grab bars. Use non-skid mats or decals in the tub or shower. If you need to sit down in the shower, use a plastic, non-slip stool. Keep the floor dry. Clean up any water that spills on the floor as soon as it happens. Remove soap buildup in the tub or shower regularly. Attach bath mats securely with double-sided non-slip rug tape. Do not have throw rugs and other  things on the floor that can make you trip. What can I do in the bedroom? Use night lights. Make sure that you have a light by your bed that is easy to reach. Do not use any sheets or blankets that are too big for your bed. They should not hang down onto the floor. Have a firm chair that has side arms. You can use this for support while you get dressed. Do not have throw rugs and other things on the floor that can make you trip. What can I do in the kitchen? Clean up any spills right away. Avoid walking on wet floors. Keep items that you use a lot in easy-to-reach places. If you need to reach something above you, use a strong step stool that has a grab bar. Keep electrical cords out of the way. Do not use floor polish or wax that makes floors slippery. If you must use wax, use non-skid floor wax. Do not have throw rugs and other things on the floor that can make you trip. What can I do with my stairs? Do not leave any items on the stairs. Make sure that there are handrails on both sides of the stairs and use them. Fix handrails that are broken or loose. Make sure that handrails are as long as the stairways. Check any carpeting to make sure that it is firmly attached to the stairs. Fix any carpet that is loose  or worn. Avoid having throw rugs at the top or bottom of the stairs. If you do have throw rugs, attach them to the floor with carpet tape. Make sure that you have a light switch at the top of the stairs and the bottom of the stairs. If you do not have them, ask someone to add them for you. What else can I do to help prevent falls? Wear shoes that: Do not have high heels. Have rubber bottoms. Are comfortable and fit you well. Are closed at the toe. Do not wear sandals. If you use a stepladder: Make sure that it is fully opened. Do not climb a closed stepladder. Make sure that both sides of the stepladder are locked into place. Ask someone to hold it for you, if possible. Clearly  mark and make sure that you can see: Any grab bars or handrails. First and last steps. Where the edge of each step is. Use tools that help you move around (mobility aids) if they are needed. These include: Canes. Walkers. Scooters. Crutches. Turn on the lights when you go into a dark area. Replace any light bulbs as soon as they burn out. Set up your furniture so you have a clear path. Avoid moving your furniture around. If any of your floors are uneven, fix them. If there are any pets around you, be aware of where they are. Review your medicines with your doctor. Some medicines can make you feel dizzy. This can increase your chance of falling. Ask your doctor what other things that you can do to help prevent falls. This information is not intended to replace advice given to you by your health care provider. Make sure you discuss any questions you have with your health care provider. Document Released: 04/07/2009 Document Revised: 11/17/2015 Document Reviewed: 07/16/2014 Elsevier Interactive Patient Education  2017 [employer].

## 2021-06-13 ENCOUNTER — Telehealth (INDEPENDENT_AMBULATORY_CARE_PROVIDER_SITE_OTHER): Payer: Medicare Other | Admitting: Nurse Practitioner

## 2021-06-13 DIAGNOSIS — E1169 Type 2 diabetes mellitus with other specified complication: Secondary | ICD-10-CM | POA: Diagnosis not present

## 2021-06-13 DIAGNOSIS — Z7189 Other specified counseling: Secondary | ICD-10-CM

## 2021-06-13 MED ORDER — FREESTYLE LITE TEST VI STRP: ORAL_STRIP | 1 refills | Status: DC

## 2021-06-13 NOTE — Progress Notes (Signed)
Careteam: Patient Care Team: Lauree Chandler, NP as PCP - General (Geriatric Medicine)  Advanced Directive information    Allergies  Allergen Reactions   Simvastatin Other (See Comments)    Stomach pain, low back pain    Shrimp [Shellfish Allergy] Other (See Comments)    Swelling and hypersensitivity    Chief Complaint  Patient presents with   Acute Visit    Patient complains of low blood sugar since starting glipizide. Patient started shaking real bad that she was unable to hold a cup to get some juice. Patient states that she checked her blood sugar and it was in the low 70s. Patient states that her blood glucose in the mornings in between 120-130. Patient has stopped taking medication. She stopped taking medication after about a week of taking it.  Medication made her feel drained and exhausted.      HPI: Patient is a 65 y.o. female for follow up on her blood sugars.  Her A1c was 9.1 on 11/7. Her diet had changed when her daughter and son and law were there and he was always cooking. She also retired.  She has had less movement since she has retired.  She was started on glipizide and had several episodes of shaking and feeling bad. She is also on metformin twice daily  Reports she has increased her physical activity and going to the gym now.  She is now working on Saturday.  She stopped glipizide since she had the low blood sugars and has felt better since.   Generally blood sugar has been 120s, 130s.  In the afternoon 160s-170s.  She was not taking her blood sugars in October   Review of Systems:  Review of Systems  Constitutional:  Negative for chills, fever and weight loss.  HENT:  Negative for tinnitus.   Respiratory:  Negative for cough, sputum production and shortness of breath.   Cardiovascular:  Negative for chest pain, palpitations and leg swelling.  Gastrointestinal:  Negative for abdominal pain, constipation, diarrhea and heartburn.  Genitourinary:   Negative for dysuria, frequency and urgency.  Musculoskeletal:  Negative for back pain, falls, joint pain and myalgias.  Skin: Negative.   Neurological:  Negative for dizziness and headaches.  Psychiatric/Behavioral:  Negative for depression and memory loss. The patient does not have insomnia.    Past Medical History:  Diagnosis Date   Allergy    Anemia, unspecified    Asthma    as a child, no problem as adult, no inhaler   Bradycardia    past hx    Diabetes mellitus    Type II   History of blood transfusion    pre hysterectomy   Osteoarthrosis, unspecified whether generalized or localized, unspecified site    Other and unspecified hyperlipidemia    Other convulsions    last one 2013   Other malaise and fatigue    Other specified cardiac dysrhythmias(427.89) 2006   "history of Bradycardia" .  wore a mointor nothing found   Seizure disorder ([place])    last seizure 2013 per pt 05-12-2019 PV    Unspecified essential hypertension    Unspecified vitamin D deficiency    Past Surgical History:  Procedure Laterality Date   ABDOMINAL HYSTERECTOMY     APPENDECTOMY     BREAST LUMPECTOMY Right 1997   BREAST LUMPECTOMY Bilateral    neg for cancer   CHOLECYSTECTOMY N/A 02/28/2016   Procedure: LAPAROSCOPIC CHOLECYSTECTOMY;  Surgeon: Mickeal Skinner, MD;  Location: [facility]  OR;  Service: General;  Laterality: N/A;   IR CATHETER TUBE CHANGE  01/02/2018   IR RADIOLOGIST EVAL & MGMT  12/25/2017   IR RADIOLOGIST EVAL & MGMT  01/08/2018   LAPAROSCOPIC APPENDECTOMY N/A 01/15/2018   Procedure: LAPAROSCOPIC APPENDECTOMY CONVERTED TO OPEN APPENDECTOMY SMALL BOWEL RESECTION;  Surgeon: Donnie Mesa, MD;  Location: [facility];  Service: General;  Laterality: N/A;   SUPRA-UMBILICAL HERNIA N/A 3/53/6144   Procedure: OPEN REPAIR OF SUPRA-UMBILICAL VENTRAL HERNIA WITH MESH;  Surgeon: Donnie Mesa, MD;  Location: [place];  Service: General;  Laterality: N/A;   Social History:   reports that she has never smoked.  She has never used smokeless tobacco. She reports that she does not drink alcohol and does not use drugs.  Family History  Problem Relation Age of Onset   Diabetes Father    Kidney failure Father    Heart disease Father    Hypertension Father    Seizures Father    Colon polyps Neg Hx    Colon cancer Neg Hx    Rectal cancer Neg Hx    Stomach cancer Neg Hx     Medications: Patient's Medications  New Prescriptions   No medications on file  Previous Medications   AMLODIPINE (NORVASC) 10 MG TABLET    Take 1 tablet (10 mg total) by mouth daily.   BIOTIN PO    Take 1 tablet by mouth daily.   BLOOD GLUCOSE MONITORING SUPPL (FREESTYLE LITE) DEVI    Use to test blood sugar twice daily. Dx: E11.9   CETIRIZINE (ZYRTEC) 10 MG TABLET    Take 10 mg by mouth at bedtime.   COLLAGEN PO    Take by mouth daily. Powder formula, adds to coffee   EZETIMIBE (ZETIA) 10 MG TABLET    Take 1 tablet (10 mg total) by mouth daily.   FERROUS SULFATE 325 (65 FE) MG TABLET    Take 1 tablet (325 mg total) by mouth 2 (two) times daily with a meal.   GLIPIZIDE (GLUCOTROL) 5 MG TABLET    Take 1 tablet (5 mg total) by mouth daily before breakfast.   GLUCOSE BLOOD (FREESTYLE LITE) TEST STRIP    Use to test blood sugar twice daily. Dx: E11.9   LANCETS (FREESTYLE) LANCETS    Use to test blood sugar twice daily. Dx: E11.9   LEVETIRACETAM (KEPPRA) 500 MG TABLET    Take 1 tablet (500 mg total) by mouth every 12 (twelve) hours for seizures   LISINOPRIL (ZESTRIL) 30 MG TABLET    Take 1 tablet (30 mg total) by mouth daily for blood pressure.   METFORMIN (GLUCOPHAGE) 1000 MG TABLET    Take 1 tablet (1,000 mg total) by mouth 2 (two) times daily with meals to control blood sugar.   ROSUVASTATIN (CRESTOR) 5 MG TABLET    Take 0.5 tablets (2.5 mg total) by mouth at bedtime.  Modified Medications   No medications on file  Discontinued Medications   No medications on file    Physical Exam:  There were no vitals filed for this  visit. There is no height or weight on file to calculate BMI. Wt Readings from Last 3 Encounters:  06/12/21 172 lb 3.2 oz (78.1 kg)  05/01/21 164 lb (74.4 kg)  12/12/20 160 lb 3.2 oz (72.7 kg)    Physical Exam  Labs reviewed: Basic Metabolic Panel: Recent Labs    12/12/20 1555 05/01/21 0848  NA 141 138  K 3.7 3.6  CL 107 104  CO2 26 23  GLUCOSE 112* 240*  BUN 12 13  CREATININE 0.78 0.95  CALCIUM 9.3 9.5   Liver Function Tests: Recent Labs    12/12/20 1555 05/01/21 0848  AST 19 14  ALT 18 15  BILITOT 0.4 0.5  PROT 7.1 7.3   No results for input(s): LIPASE, AMYLASE in the last 8760 hours. No results for input(s): AMMONIA in the last 8760 hours. CBC: Recent Labs    06/13/20 1550 12/12/20 1555 05/01/21 0848  WBC 7.5 7.3 6.7  NEUTROABS  --  3,730 3,645  HGB 11.4* 11.9 12.5  HCT 34.9* 37.2 38.5  MCV 82.3 81.4 81.1  PLT 292 317 300   Lipid Panel: Recent Labs    12/12/20 1555  CHOL 140  HDL 71  LDLCALC 50  TRIG 105  CHOLHDL 2.0   TSH: No results for input(s): TSH in the last 8760 hours. A1C: Lab Results  Component Value Date   HGBA1C 9.1 (H) 05/01/2021     Assessment/Plan 1. Type 2 diabetes mellitus with other specified complication, without long-term current use of insulin (HCC) -she has already stopped glipizide due to hypoglycemia, will remove from medication list. She has made dietary modifications and will continue to work on this as well as increase physical activity.  She continues on metformin twice daily - glucose blood (FREESTYLE LITE) test strip; Use to test blood sugar twice daily. Dx: E11.9  Dispense: 180 each; Refill: 1  2. Advance care planning -MOST form completed today - DNR (Do Not Resuscitate)   Next appt: 08/02/2021 Carlos American. Harle Battiest  [facility] [phone]    Virtual Visit via video   I connected with patient on 06/13/21 at 11:00 AM EST by mychart video and verified that I am  speaking with the correct person using two identifiers.  Location: Patient: home Provider: [facility] [place] [facility]   I discussed the limitations, risks, security and privacy concerns of performing an evaluation and management service by telephone and the availability of in person appointments. I also discussed with the patient that there may be a patient responsible charge related to this service. The patient expressed understanding and agreed to proceed.   I discussed the assessment and treatment plan with the patient. The patient was provided an opportunity to ask questions and all were answered. The patient agreed with the plan and demonstrated an understanding of the instructions.   The patient was advised to call back or seek an in-person evaluation if the symptoms worsen or if the condition fails to improve as anticipated.  I provided 18 minutes of non-face-to-face time during this encounter.  Carlos American. Harle Battiest Avs printed and mailed

## 2021-06-13 NOTE — Progress Notes (Signed)
This service is provided via telemedicine  No vital signs collected/recorded due to the encounter was a telemedicine visit.   Location of patient (ex: home, work):  Home  Patient consents to a telephone visit:  Yes, see encounter dated 06/13/2021  Location of the provider (ex: office, home):  [place]  Name of any referring provider:  N/A  Names of all persons participating in the telemedicine service and their role in the encounter:  Sherrie Mustache, Nurse Practitioner, Carroll Kinds, CMA, and patient.   Time spent on call:  8 minutes with medical assistant

## 2021-06-14 ENCOUNTER — Other Ambulatory Visit: Payer: Self-pay | Admitting: *Deleted

## 2021-06-14 MED ORDER — ACCU-CHEK GUIDE W/DEVICE KIT: PACK | 0 refills | Status: AC

## 2021-06-14 MED ORDER — ACCU-CHEK GUIDE VI STRP: ORAL_STRIP | 3 refills | Status: AC

## 2021-06-14 MED ORDER — ACCU-CHEK FASTCLIX LANCETS MISC: 3 refills | Status: AC

## 2021-06-14 NOTE — Telephone Encounter (Signed)
[facility] request Diabetic Supplies.

## 2021-06-21 ENCOUNTER — Telehealth: Payer: Self-pay | Admitting: *Deleted

## 2021-06-21 NOTE — Telephone Encounter (Signed)
[facility] Faxed Medical Necessity Form for High Utilization for Blood Sugar Machine Supplies.  Filled out and placed in [place] folder to review and sign. To be faxed back to Fax:[phone] once completed.

## 2021-07-02 ENCOUNTER — Other Ambulatory Visit: Payer: Self-pay | Admitting: Nurse Practitioner

## 2021-07-02 DIAGNOSIS — I10 Essential (primary) hypertension: Secondary | ICD-10-CM

## 2021-07-02 DIAGNOSIS — E785 Hyperlipidemia, unspecified: Secondary | ICD-10-CM

## 2021-07-02 DIAGNOSIS — E1169 Type 2 diabetes mellitus with other specified complication: Secondary | ICD-10-CM

## 2021-07-17 ENCOUNTER — Other Ambulatory Visit (HOSPITAL_COMMUNITY): Payer: Self-pay

## 2021-07-20 ENCOUNTER — Telehealth: Payer: Self-pay | Admitting: *Deleted

## 2021-07-20 NOTE — Telephone Encounter (Signed)
Patient called and stated that she hasn't been able to get her test strips from the pharmacy.   I called [facility] and spoke with pharmacist and she stated that the Medical Necessity Form was never received.   Printed from and refaxed to Fax: [phone]

## 2021-07-26 ENCOUNTER — Other Ambulatory Visit (HOSPITAL_COMMUNITY): Payer: Self-pay

## 2021-07-27 ENCOUNTER — Other Ambulatory Visit (HOSPITAL_COMMUNITY): Payer: Self-pay

## 2021-07-31 MED ORDER — DIAZEPAM 5 MG TABLET
5 | Freq: Every day | ORAL | 0.00 refills | 4.00000 days | Status: AC | PRN
Start: 2021-07-31 — End: ?

## 2021-08-01 ENCOUNTER — Ambulatory Visit: Admit: 2021-08-01 | Discharge: 2021-08-08 | Payer: MEDICARE | Attending: Physician | Primary: Physician

## 2021-08-01 DIAGNOSIS — I480 Paroxysmal atrial fibrillation: Secondary | ICD-10-CM

## 2021-08-01 NOTE — Progress Notes (Signed)
Left message on 2/6 for the patient to call back for pre-visit intake for their upcoming video/telephone appointment on 2/7 with Dr. Moss.    Please forward the caller to [phone]. [phone].    Thank you,  Jake Wylder CMA

## 2021-08-01 NOTE — Progress Notes (Signed)
I performed this evaluation using real-time telehealth tools, including a live video Zoom connection between my location and the patient's location. Prior to initiating, the patient consented to perform this evaluation using telehealth tools. My location for an independent visit or attending location for a shared visit is not in a [facility] clinical facility.    Failed Video Connection:  Due to failed video connection, I obtained consent from the patient to conduct this visit by telephone only. I spent a total of 14 minutes in audio communication with this patient.     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    Debbie Harper is a 66 y.o. 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    Allow me to review her history for the electronic MEDICAL RECORD NUMBER  Exertional fatigue and mild shortness of breath started on 08/23/2015 and persisted thereafter, were also sometimes associated with lightheadedness.  She had previously been very active, feeding and riding her horse nearly every day.  Evaluation was notable for a baseline right bundle branch block, and periods of symptomatic 2:1 AV block while reaching peak exercise on a treadmill stress test (see details below).  Holter monitoring also reportedly showed blocked PAC's with resultant bradycardia, though to my review blocked atrial activity was not typically premature (see details below). 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     A dual-chamber  device was implanted without complication on 0/02/3234.  Thereafter, she noticed significant improvement in her symptoms, but had one episode very similar to her pre-pacemaker symptoms on 11/18/2015 and several milder episodes afterwards aborted by resting.  Device function was normal -- at follow-up 11/2015, she paced 5% in the RA and 1% in the RV.  She underwent breast augmentation surgery on 04/18/2016.     At her visit in April 2018, she reported having a "wide complex tachycardia" for 5 minutes (up to 115 bpm) after a colonoscopy 06/2016.  She also started having episodes of palpitations in February and specifically recalled symptomatic episodes on 2/12 and 2/24 -- the latter correlated with a >1 hour episode of atrial fibrillation recorded by the device.  She was started on aspirin 81 mg daily and recommended to take metoprolol 12.5 mg twice daily, though opted not to take it.     At her visit in 03/2017, she reported shortness of breath with exertion (such as walking up stairs) and wondered if there could be a functional issue with her heart -- she actually contacted me prior to the visit to request that an echocardiogram be scheduled the same day.  The echocardiogram showed normal LV function and no hemodynamically significant valvular disease.  Device interrogation showed 62 minutes of atrial fibrillation between April and October 2018, with the longest episode lasting 11:30 minutes on 01/01/2017.  Rate histograms showed a shift to the left, with atrial sensing or pacing in the 60s more than 50% of the time and only rare atrial sensing above 90 bpm.  We agreed to try adding rate-response to her  device, and to continue conservative management of the rare atrial fibrillation.  The rate-response was later disabled because she was feeling "jittery."   Device check in 06/2017 showed 0.1% atrial high rate detection (10 episodes), with the longest 53 minutes.   Device check in 10/2017 showed 0.1% atrial high rate  detection, with the longest episode 35 minutes.  There was also a 20 beat episode of NSVT recorded on 09/17/2017, correlating with an episode of lightheadedness (see my note dated 11/15/2017).  I recommended she try taking metoprolol 12.5 mg twice daily and consider cardiac MRI and/or PET (particularly given the paroxysmal AV block and atrial fibrillation).   Cardiac MRI in 12/2017 revealed mid-wall and subepicardial late gadolinium enhancement in the lateral wall at the level of the base.  A follow-up PET CT scan showed no abnormal FDG uptake and no resting perfusion defects.   She contacted me 03/19/2018 to report some chest pain "off and on" starting the day before and sent remote device transmissions.   At her visit in 04/2018, she was feeling well overall. She had occasional symptoms that corresponded to atrial fibrillation, though she did not feel it was impacting her life significantly. She denied any additional lightheadedness. She preferred to continue metoprolol for atrial fibrillation.    She had a follow-up echocardiogram in early May 2020 showing normal LV function (details below).  Device interrogations continued to show an overall low burden of atrial fibrillation.   At follow-up 11/18/2018, she was feeing "pretty good". She had some sensation of an irregular heart beat at night affecting sleep. We increased her metoprolol evening dose. Device interrogation showed rare brief NSVT.    On 04/14/2019, she presented to the ED at [facility] with episodes of low blood pressure of 12W systolic at home over the prior several days and some "off or blurry" vision. She reportedly had no focal neurologic deficits, and per records from her hospitalization, it was not felt symptoms were consistent with stroke. She was treated with IV fluids and discharged from ED.    Remote device interrogation 04/16/2019 showed normal function, with 106 episodes of atrial fibrillation since 02/10/2019 (burden 1.7%).  The  longest formally measured episode was 3 hours 40 min on 03/11/2019, though based on the log I suspect she was continuously in atrial fibrillation from about midnight until 9 am on 04/13/2019; average ventricular rates were in the 80s.   At follow-up 04/21/2019, she was  "hanging in there."  During the recent atrial fibrillation episode, she felt an irregular heart rate, but not a "super high" pulse.  She did not take her metoprolol, being concerned about lowering her blood pressure further. She continued to have some shortness of breath with exertion, particularly at altitude, forcing her to rest more than she felt she should have to.  We recalled that previously enabling the rate-response feature on her pacemaker in 03/2017 made her feel anxious and jittery.  We reviewed rhythm control options, but she preferred to continue low-dose metoprolol to try to prevent episodes.   Device interrogation 10/12/2019 showed 54 high rate atrial episodes, with burden 3% -- the longest was 08/01/2019 for 6 hours and 24 minutes.  Histograms show predominant V rates 60-140 bpm with minimal counts in bins to 180 bpm during AF.  She contacted me with questions about 10/10/2019 after 11pm: episode logs show atrial fibrillation from about 1 am to about 6 am on 10/11/2019.   At her visit 10/20/2019, she reported fatigue and low blood  pressure that prompted her to decrease her metoprolol back to 12.5 mg twice daily.  She got the first dose of Pfizer Covid-19 vaccine in 06/2019 and had atrial fibrillation that night -- she planned to defer the second shot for the time being. After discussion, she decided to proceed with ablation for the atrial fibrillation.   The ablation procedure was performed 12/21/2019.  All 4 PVs were isolated.  She was discharged the following day.     Remote transmission from her device 02/01/2020 recorded 2 episodes of non-sustained AF since the ablation, on 01/23/2020 for 9 seconds and on 01/26/2020 for 13 seconds --  however, EGMs appeared consistent with brief oversensing.   At follow-up in 01/2020, she was feeling "OK" overall, with days when she felt very "tired" and "out of it."  She had some palpitations that didn't feel like atrial fibrillation. Device interrogation showed no arrhythmia episodes, and I recommended she continue metoprolol therapy.  She discontinued apixaban 03/22/2020 (3 months post-procedure)   She contacted me in 03/2020 with episodes of palpitations and brief episodes of sharp, "shooting chest pain."  Device interrogation showed no major arrhythmia episodes.  There was a total of 24 minutes of atrial fibrillation from 8/9 through 10/1, but none lasting more than a minute (so no recordings to review).  We decided to proceed with Zio monitoring.   The Zio and subsequent interrogations showed no atrial fibrillation.     She developed Covid 07/06/2020.  She subsequently reported sensations like she was not getting enough air (different than atrial fibrillation).   Device interrogation 07/19/2020 showed 4 episodes of AT/AF since 06/14/2020, the longest 30 seconds on 07/12/2020 at 3:23pm.  EGMs all showed atrial oversensing.   At follow-up 07/26/2020, she was feeling well and had recovered from Covid. She felt that the atrial fibrillation symptoms were better since the ablation in 11/2019, and there were no true atrial fibrillation events on pacemaker interrogation.  She did report episodes of "strong" heart beats she thought could be NSVT. She was planning to reduce her work hours and switch to [employer]. We agreed to continue monitoring arrhythmia burden via her pacemaker, and increase metoprolol if NSVT episodes became more frequent or bothersome.    Today, Debbie Harper feels well overall. She felt "kind of funny" for about a month after device programming, but that has improved.  She retired 08/11/2020 and has been doing more outdoor activities but no regular cardio exercise.  She is not riding much anymore (her  horse is 87 year olds).  She has had only 1 or 2 episodes that she thinks could have been NSVT, but without symptoms nearly as strong as the first time. She has had no symptoms of recurrent atrial fibrillation.    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    History of motion sickness     IBS (irritable bowel syndrome) 10/18/2015    IBS (irritable bowel syndrome)     Mobitz type 2 second degree atrioventricular block 10/18/2015    s/p pacemaker     Pacemaker 11/29/2015    RBBB 10/18/2015    Thyroid nodule 10/18/2015    since 2003       CURRENT MEDICATIONS     Your Medications at the End of This Visit       Disp Refills Start End    diazePAM (VALIUM) 5 mg tablet   05/11/2021  Sig - Route: Take 5 mg by mouth daily as needed - Oral    Class: Historical Med    metoprolol tartrate (LOPRESSOR) 25 mg tablet        Sig - Route: Take 12.5 mg by mouth Twice a day    - Oral    Class: Historical Med    lansoprazole (PREVACID) 30 mg capsule 14 capsule 0 12/23/2019     Sig - Route: Take 1 capsule (30 mg total) by mouth daily Please take medication for two weeks then stop. - Oral    Renewals     Renewal requests to authorizing provider Webb Silversmith Nori Riis Destruel, NP) <b>prohibited</b>                ALLERGIES     Allergies as of 08/01/2021  Review status set to Review Complete by [place] on 07/31/2021      Severity Noted Reaction Type Reactions    Sulfamethoxazole-trimethoprim Not Specified 10/18/2015    Itching    Insomnia          SOCIAL HISTORY     She reports that she has never smoked. She has never used smokeless tobacco. She reports current alcohol use. She reports that she does not use drugs.    She is divorced.  She has 3 children.  She lives in [place], [place].  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    She has 2 horses (a Palomino and a Halma, ages 63 and 60).  She also wakeboards in the summer.    FAMILY HISTORY     Her father underwent CABG in his late 23's and has cardiomyopathy.  He was a heavy smoker. He passed away in early 08/22/2018.    Her mother had a heart attack around age 90.  She died after complications from a head injury in June 2016 after being knocked down by a horse.    Her grandfather had colon cancer.    REVIEW OF SYSTEMS     Gastrointestinal:  + IBS with cramping and gas.  No blood in the stool.  + polyps on colonoscopy in 2013-08-22.    PHYSICAL EXAMINATION     Telephone only    DATA     I reviewed prior 12-lead EKGs:  - 12/21/2019: sinus rhythm with right bundle branch block at 75 bpm  - 04/02/2017: competing sinus rhythm and atrial pacing at 60 bpm with intact AV conduction and right bundle branch block (QRSd 124 msec).  - 10/02/2016: atrial pacing at 61 bpm with intact AV conduction and right bundle branch block  - 11/29/2015: normal sinus rhythm with right bundle branch block at 64 bpm.  - 10/18/2015: sinus bradycardia at 57 bpm with right bundle branch block  - 08/29/2015: sinus bradycardia at 57 bpm with right bundle branch block.    I reviewed prior pacemaker interrogations:   06/15/2021 (remote): Vp 14.9%.  No arrhythmia episodes since 05/30/2021   05/30/2021 (remote): Several AT/AF episodes, with EGMs showing atrial oversensing and not true atrial fibrillation. Atrial sensitivity decreased (0.45 mV to 0.9 mV)   07/19/2020 (remote): 4 episodes of AT/AF since 06/14/2020, the longest 30 seconds on 07/12/2020 at 3:23pm.  There was one 8 beat episode of NSVT on 06/25/2020 at about 5am.  AT/AF EGMs all showed atrial oversensing and not true  atrial fibrillation:       02/01/2020 (remote): 2 episodes of non-sustained AF since the ablation, on 01/23/2020 for 9 seconds and on 01/26/2020 for 13 seconds, but EGMs appeared consistent with brief oversensing and not true atrial  fibrillation.   10/12/2019 (remote): 54 high rate atrial episodes, with 46% A-pacing and 4% V-pacing.  AT/AF burden was 3% -- the longest episode was 08/01/2019 for 6 hours and 24 minutes (though other shorter episodes may have been part of one longer episode, including on 10/11/2019).  Histograms show predominant V rates 60-140 bpm with minimal counts in bins to 180 bpm during AF.     04/16/2019 (remote): normal overall function, with 48% atrial pacing and 2.9% ventricular pacing.  Total atrial fibrillation burden was 1.7% since 01/2019, with longest episode 3 hours 40 minutes on 03/11/2019.  There were multiple episodes 10/18-10/19/2020.  The longest episode within that time period measured formally was 1 h 20 min, though based on the log I suspect she was continuously in atrial fibrillation from ~midnight until ~9am on 04/13/2019.   11/18/2018 (see separate full report in [facility]):    - She paces 45.4% in the RA (stable) and 4.6% in the RV (stable).    - Atrial fibrillation burden has been 0.9%, the longest episode lasting 5 hours on 11/07/2018 with max V-rate of 130 bpm (max V-rate during all atrial fibrillation episodes 133 bpm)  - No NSVT recorded  - Many longer episodes were overnight    I reviewed the report and tracings from a [employer] monitor worn 6/8-6/23/2022, which showed:  * The observed rhythms are paced rhythm with intrinsic beat.  * The Maximum Heart Rate recorded was 150 bpm, Day 6 / 05:30:53 pm, the Minimum Heart Rate recorded was 59 bpm, Day 3 / 04:00:42 pm and the Average Heart Rate was 66 bpm.  * There were 132 PVCs with a burden of 0.01 %.  * There were 4 PSVCs with a burden of < 0.01 %. There was 1 occurrences of Supraventricular Tachycardia with the longest episode 4 beats, Day 6 / 05:30:57 pm and the fastest episode 150 bpm, Day 6 / 05:30:57 pm.  * QTcB >460 ms 59.83%  * There were 0 Patient reported events.    I reviewed the report and tracings from a Zio patch worn 10/13-10/27/2021, which  showed:  - Patient had a min HR of 60 bpm, max HR of 103 bpm, and avg HR of 66 bpm. Predominant underlying rhythm was Sinus Rhythm.   - Isolated SVEs were rare (<1.0%), SVE Couplets were rare (<1.0%), and SVE Triplets were rare (<1.0%).   - Isolated VEs were rare (<1.0%), VE Couplets were rare (<1.0%), and no VE Triplets were present.  - Final interpretation:  1. Predominant rhythm is sinus. Intermittent atrial and ventricular pacing noted.  2. Supraventricular and ventricular ectopic burden as above.  3. Single patient triggered event demonstrates sinus rhythm.  4. No identified atrial fibrillation.    I reviewed the report and tracings from a 24-hour Holter monitor 08/31/2015  Hospital):  - Average HR 64.  Minimum 38.  Maximum 97.  Predominant rhythm sinus.  - Single PVC's occasional; no runs.  - Frequent blocked PACs with resultant bradycardia.  No SVT or atrial fibrillation.    - Occasional shortness of breath during periods of bradycardia with blocked PACs.  - On my review of tracings, several strips appear more consistent with sinus rhythm with 2:1 block than with blocked PAC's based  on timing (though P-wave morphology is subtly different with blocked beats).  On tracings with both 2:1 and 1:1 conduction, the P-wave CL remains constant and ventricular rate exactly 1/2 during 2:1 block.  When transitioning from 1:1 to 2:1 block, the first blocked P-wave comes slightly early, but possibly due to increase in sinus rate.  - Less frequently (particularly 2:26am), bradycardia appears due to blocked PAC's in bigeminy.  - One period of 3:2 conduction (9:28am) without clear PR prolongation    I reviewed the reports of the following tests:   Echocardiogram 03/06/2021 [facility]):  - LV normal size and function; no LVH; EF 41-66%; normal diastolic function; normal wall motion  - RV normal function  - LA and RA sizes normal  - AV normal; no AS or AI; aortic root size normal  - Mitral valve sclerotic;  no MS or MR  - Tricuspid valve normal; no RS or TR  - No significant pericardial effusion  - IVC normal size      Echocardiogram 10/31/2018 ([facility]):       Cardiac PET 02/17/2018:  1.   Normal perfusion in the left ventricle without myocardial uptake of FDG. Findings are suggestive of no active myocardial inflammatory process or scar.  2.    Normal left ventricular wall motion with a calculated left ventricular ejection fraction of >65% on rest perfusion images.     Cardiac MRI 12/31/2017:  1. Mid wall and subepicardial late gadolinium enhancement in the lateral wall at the level of the base which can be seen with myocarditis or other inflammatory cardiomyopathy. Remaining myocardium demonstrates no evidence of scar.  2. Normal left ventricular function (ejection fraction 73%).     Echocardiogram 04/02/2017:  The patient's blood pressure was 154 mmHg/70 mmHg during the study. The heart rate during the study was 73. Color Flow Doppler was utilized for this exam. Spectral Doppler was utilized for this exam.  1. The left ventricular volume is normal. LV function is hyperdynamic. LV ejection fraction is estimated to be 70 to 75%.  2. The right ventricular volume is normal. Right ventricular function is normal.  3. Left atrial size is normal. Right atrial size is normal.  4. There is no hemodynamically significant valvular disease.  5. There is no Doppler evidence of LV diastolic dysfunction.  6. The RV systolic pressure is estimated to be 23 mmHg based on a right atrial pressure of 3 mmHg.  7. No pericardial effusion noted. The inferior vena cava is less than 21 mm in diameter and collapses with inspiration consistent with a right atrial pressure of 3 mmHg.  8. Aortic root dimension is normal.  - Compared to the previous study on 09/23/15, the prior study is a stress echo.     Exercise stress echocardiogram 09/23/2015 [facility]):  - Resting EKG sinus rhythm with right bundle branch block   -  Resting echocardiogram: Normal LV size, wall thickness, and function, EF 60-65%.  Atria normal size.  RV normal.  Mild anterior leaflet MVP and mild MR.  No other significant valvular pathology.  No pericardial effusion.  - Walked 8 min 11 sec on [place], with peak HR 118 bpm early but development of 2:1 AV block at peak exercise with HR in the 60s and associated shortness of breath (2:1 block versus atrial bigeminy with block).  Returned to 1:1 conduction in recovery.  Episodes of supraventricular bigeminy noted.  - I reviewed the tracings:  - At 2:24,  there is a period of 3:2 conduction with constant P-P interval and no clear PR prolongation, that appears to persist through 5:50  - HR then drops from 89 to 74 but P-waves difficult to see  - At 6:50 there appears to be 2:1 block with occasional outflow tract PVC's  - At 7:50 there is suspicion for 2:1 conduction with blocked P-waves in prior T-waves.  - At 15 seconds into recovery there are periods with 1:1 conduction at double the rate of final exercise EKGs (5:4 and 6:5 conduction, but with little to no change in PR interval and R-R interval around blocked P-wave exactly double that of conducted intervals).  This apparent Mobitz type II second degree AV block becomes more apparent as the rate continues to slow in recovery.  - By 2 minutes into recovery there is clear 2:1 AV block that persists to 3 minutes, though with one episode of 3:2 block that appears slightly more consistent with Wenckebach based on the PR intervals and length of pause  - Stress echocardiogram:  Appropriate increase in contractility of all segments; no evidence of ischemia.     Chest CTA 09/22/2015 [facility]):  - Negative for PE or other explanation for shortness of breath.  - Few liver lesions, probably cysts.    - Ascending and descending aorta normal caliber     PFTs 09/08/2015:  - FVC, FEV1, FEV1/FVC ratio, and FEF25-75% within normal limits  - TLC within normal limits  - FRC and RV  increased  - With bronchodilators, there is significant response.  - Diffusing capacity high.  - Conclusions: Increased diffusing capacity consistent with left heart failure or shunting.  Although flow rates are within normal limits, the overinflation and response to bronchodilators are characteristic of reactive airways ("minimal")      The most recent laboratory evaluation at [facility] included the following:  Lab Results   Component Value Date/Time    NA 143 12/22/2019 05:10 AM    K 3.4 (L) 12/22/2019 05:10 AM    CL 109 12/22/2019 05:10 AM    CO2 28 12/22/2019 05:10 AM    BUN 8 12/22/2019 05:10 AM    GLU 99 12/22/2019 05:10 AM    CHOL 195 12/21/2019 10:18 AM    LDL 119 12/21/2019 10:18 AM    HDL 62 12/21/2019 10:18 AM    TG 69 12/21/2019 10:18 AM     Lab Results   Component Value Date    Creatinine 0.65 12/22/2019    Urea Nitrogen, Serum / Plasma 8 12/22/2019    Sodium, Serum / Plasma 143 12/22/2019    Potassium, Serum / Plasma 3.4 (L) 12/22/2019    Chloride, Serum / Plasma 109 12/22/2019    Carbon Dioxide, Total 28 12/22/2019    eGFR - high estimate 109 12/22/2019    eGFR - low estimate 94 12/22/2019     No results found for: ALT, AST, ALKP, DBILI, TBILI, ANA4, TBILWB, GGT, GGTEXL, GGTEXQ, ANA5   No results found for: WBC, WBCM, WCBD, HGB, HGBM, HCT, HCTM, NSH, HCT22, MCV, PLT  No results found for: TSH  No results found for: INR, PT, LABPROT, PROTIME     ASSESSMENT AND RECOMMENDATIONS     In summary, Debbie Harper is a 66 y.o. 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  She was subsequently diagnosed  with paroxysmal atrial fibrillation, as well as occasional NSVT, and underwent ablation for atrial fibrillation 12/21/2019.  Imaging evaluation has shown mid-wall and epicardial LGE at the lateral base, but no perfusion defects or evidence  of abnormal FDG uptake on subsequent PET scanning.    We focused our evaluation and discussion today on the following problems:    1) Atrial fibrillation:  - She had a prior history of relatively infrequent palpitations and was diagnosed with paroxysmal atrial fibrillation via device interrogation.  The burden continued to slowly increase, and she was symptomatic with longer episodes.  She decided to proceed with catheter ablation, performed 12/21/2019 -- device interrogation has shown no episodes of true atrial fibrillation since, and her symptoms markedly improved.    We agreed on the following plan:  - We will continue to monitor arrhythmia burden via the implanted device  - She will continue metoprolol    2) NSVT:  - She had a symptomatic, 20-beat run of NSVT in 08/2017 associated with significant lightheadedness.  We initiated the metoprolol after that, with improvement in NSVT burden. Given the combination of AV block, sinus node dysfunction, paroxysmal atrial fibrillation, and NSVT, I suggested further evaluation for inflammatory cardiomyopathy such as sarcoid.  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  The etiology of the LGE is not clear -- perhaps she had a prior episode of myocarditis.    - The combination of the LGE and NSVT is obviously concerning, though she has normal LV function, no sustained ventricular arrhythmias, and no syncope.  Since starting low dose metoprolol she has had only infrequent and mild symptoms. I do not think there is an indication for ICD upgrade at this time.   - If NSVT episodes become more frequent or bothersome, it would be reasonable to try increasing the very low dose metoprolol further.    3) Stroke prevention:  - Her CHA2DS2-VASc score is 2.  - She stopped apixaban 3 months post-procedure, in part given her preference as someone who enjoys  riding horses.  Since we are able to monitor for atrial fibrillation via the device, and her only risk factors will be age and gender, I think it will be safe to remain off therapeutic anticoagulation for the time being.  We will have a low threshold to resume if she has recurrent atrial fibrillation (particularly if she is no longer riding).    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  Her ventricular pacing burden has remained low overall, and LV function has remained normal.  - She will continue routine pacemaker checks remotely and in the clinic.    5) Possible sinus node dysfunction:  - She may have some degree of chronotropic incompetence that contributes to exertional dyspnea, but felt poorly with rate-response enabled.  We could consider another trial of rate-response with less aggressive settings in the future.      It is a pleasure participating in Debbie Harper's care. Please do not hesitate to contact me with any questions or concerns.    Estrellita Ludwig. Manus Rudd, MD, [facility], [facility]  Professor of Clinical Medicine  Division of Cardiology  Cardiac Electrophysiology

## 2021-08-01 NOTE — Patient Instructions (Signed)
It was a pleasure to speak with you again.    Continue the low-dose metoprolol and let me know if you are having any more arrhythmia symptoms -- we will continue to follow your heart rhythm via regular pacemaker interrogations.    Let's plan to follow-up in 1 year, or sooner as needed.      ===========================================      MyChart Messages  For your safety and best care, please DO NOT use MyChart messages to report symptoms. (Symptoms should be reported by calling [phone]). Please use MyChart for non-urgent matters such as general questions, non-urgent prescription refills, or non-urgent scheduling.   -Please note that MyChart messages are routed to a central pool and one of your provider's team members will get back to you. Expect up to 3 business days for response.    Lab and test scheduling and results:  During your visit, your provider may order radiology exams or other tests. If you have not heard back within 1-2 weeks to be scheduled, please call:   For echocardiogram or stress-echo: [phone] [id number], Option 1.   For radiology (CT scan, MRI, PET scan, perfusion stress-test): [phone]   For other tests, call our office: [phone]  Test results may take up to 7- 10 business days and may be communicated to you in the following ways:   If you have signed up for MyChart, your results will be available on your MyChart account.   If you are scheduled for a follow up appointment after your tests are completed, your provider may wish to wait and discuss them during the follow up appointment.    We will not call you with the results of a normal test or device interrogation.     Procedure/Admission scheduling  Our EP procedure scheduling team will contact you to pick a date for your procedure or admission. Please call [phone] to speak with the schedulers if you do not hear back in 5 business days. This is the direct scheduling line and should only be used for procedure scheduling.    Paging  Operator:   If you have an urgent/emergent problem after 5pm, during the weekend, or on a holiday, please call [phone] and ask the operator to page the EP fellow on-call.    Temporary leave/disability forms directly related to EP procedures:  Please fill out all your demographic information and include anticipated work absence dates.  Please allow for a 2-week turnaround time for all paperwork submitted.   EP clinic fax: [phone]    Clinical Notes on My Chart:  Progress notes documented by your healthcare team will now be available on the MyChart portal. We believe that patients should be a part of the healthcare team. Please recognize that these notes are for documentation purposes and typos/minor errors may occur. If you identify any major discrepancies in the documentation, please let us know via MyChart or bring them to your next scheduled visit. With increased transparency, our hope is that we create more trust, better communication and increased satisfaction.    Research studies  At [facility], our primary goal is to treat your cardiac arrhythmia. We also strive to gather new information to help improve our understanding and treatment of all patient with cardiac arrhythmias. You may hear from our research coordinators about several studies we are conducting. These may involve collecting special ECG recordings or blood samples, or data during an electrophysiology study or ablation procedure. All these protocols are discussed and approved by your  electrophysiologist as well as the Estate agent before they are offered. We appreciate your consideration of participation in these research protocols to allow you early access to new technology and to help future patients with cardiac arrhythmias. Your electrophysiologist is happy to discuss any specific questions you may have about participation in these protocols, either over [place] or via video visit.      Self Scheduling  You may receive a  self-scheduling ticket via MyChart that allows you to schedule your next appointment online at the most convenient time for you. Please check MyChart after the visit.

## 2021-08-01 NOTE — Progress Notes (Deleted)
This is a shared service.      I performed this evaluation using real-time telehealth tools, including a live video Zoom connection between my location and the patient's location. Prior to initiating, the patient consented to perform this evaluation using telehealth tools. My location for an independent visit or attending location for a shared visit {Location:[address]881}.    {Failed Video Visit Note (optional):[phone]::" "}   I performed this consultation using real-time Telehealth tools, including a live video connection between my location and the patient's location. Prior to initiating the consultation, I obtained informed verbal consent to perform this consultation using Telehealth tools and answered all the questions about the Telehealth interaction.    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 as a telehealth video visit in Cardiac Electrophysiology today for further evaluation of symptomatic bradycardia, status post dual-chamber pacemaker, atrial fibrillation, and NSVT.    Ms. [name] is a 66 y.o. 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    Allow me to review her history for the electronic MEDICAL RECORD NUMBER  Exertional fatigue and mild shortness of breath started on 08/23/2015 and persisted thereafter, were also sometimes associated with lightheadedness.  She had previously been very active, feeding and riding her horse nearly every day.  Evaluation was notable for a baseline right bundle branch block, and periods of symptomatic 2:1 AV block while reaching peak exercise on a treadmill stress test (see details below).  Holter monitoring also reportedly showed blocked PAC's with resultant bradycardia, though to my review blocked atrial activity was not typically premature (see  details below). 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     A dual-chamber device was implanted without complication on 11/25/9526.  Thereafter, she noticed significant improvement in her symptoms, but had one episode very similar to her pre-pacemaker symptoms on 11/18/2015 and several milder episodes afterwards aborted by resting.  Device function was normal -- at follow-up 11/2015, she paced 5% in the RA and 1% in the RV.  She underwent breast augmentation surgery on 04/18/2016.     At her visit in April 2018, she reported having a "wide complex tachycardia" for 5 minutes (up to 115 bpm) after a colonoscopy 06/2016.  She also started having episodes of palpitations in February and specifically recalled symptomatic episodes on 2/12 and 2/24 -- the latter correlated with a >1 hour episode of atrial fibrillation recorded by the device.  She was started on aspirin 81 mg daily and recommended to take metoprolol 12.5 mg twice daily, though opted not to take it.     At her visit in 03/2017, she reported shortness of breath with exertion (such as walking up stairs) and wondered if there could be a functional issue with her heart -- she actually contacted me prior to the visit to request that an echocardiogram be scheduled the same day.  The echocardiogram showed normal LV function and no hemodynamically significant valvular disease.  Device interrogation showed 62 minutes of atrial fibrillation between April and October 2018, with the longest episode lasting 11:30 minutes on 01/01/2017.  Rate histograms showed a shift to the left, with atrial sensing or pacing  in the 60s more than 50% of the time and only rare atrial sensing above 90 bpm.  We agreed to try adding rate-response to her device, and to continue conservative management of the rare atrial fibrillation.  The rate-response was  later disabled because she was feeling "jittery."   Device check in 06/2017 showed 0.1% atrial high rate detection (10 episodes), with the longest 53 minutes.   Device check in 10/2017 showed 0.1% atrial high rate detection, with the longest episode 35 minutes.  There was also a 20 beat episode of NSVT recorded on 09/17/2017, correlating with an episode of lightheadedness (see my note dated 11/15/2017).  I recommended she try taking metoprolol 12.5 mg twice daily and consider cardiac MRI and/or PET (particularly given the paroxysmal AV block and atrial fibrillation).   Cardiac MRI in 12/2017 revealed mid-wall and subepicardial late gadolinium enhancement in the lateral wall at the level of the base.  A follow-up PET CT scan showed no abnormal FDG uptake and no resting perfusion defects.   She contacted me 03/19/2018 to report some chest pain "off and on" starting the day before and sent remote device transmissions.   At her visit in 04/2018, she was feeling well overall. She had occasional symptoms that corresponded to atrial fibrillation, though she did not feel it was impacting her life significantly. She denied any additional lightheadedness. She preferred to continue metoprolol for atrial fibrillation.    She had a follow-up echocardiogram in early May 2020 showing normal LV function (details below).  Device interrogations continued to show an overall low burden of atrial fibrillation.   At follow-up 11/18/2018, she was feeing "pretty good". She had some sensation of an irregular heart beat at night affecting sleep. We increased her metoprolol evening dose. Device interrogation showed rare brief NSVT.    On 04/14/2019, she presented to the ED at [facility] with episodes of low blood pressure of 98J systolic at home over the prior several days and some "off or blurry" vision. She reportedly had no focal neurologic deficits, and per records from her hospitalization, it was not felt symptoms were consistent  with stroke. She was treated with IV fluids and discharged from ED.    Remote device interrogation 04/16/2019 showed normal function, with 106 episodes of atrial fibrillation since 02/10/2019 (burden 1.7%).  The longest formally measured episode was 3 hours 40 min on 03/11/2019, though based on the log I suspect she was continuously in atrial fibrillation from about midnight until 9 am on 04/13/2019; average ventricular rates were in the 80s.   At follow-up 04/21/2019, she was  "hanging in there."  During the recent atrial fibrillation episode, she felt an irregular heart rate, but not a "super high" pulse.  She did not take her metoprolol, being concerned about lowering her blood pressure further. She continued to have some shortness of breath with exertion, particularly at altitude, forcing her to rest more than she felt she should have to.  We recalled that previously enabling the rate-response feature on her pacemaker in 03/2017 made her feel anxious and jittery.  We reviewed rhythm control options, but she preferred to continue low-dose metoprolol to try to prevent episodes.   Device interrogation 10/12/2019 showed 54 high rate atrial episodes, with burden 3% -- the longest was 08/01/2019 for 6 hours and 24 minutes.  Histograms show predominant V rates 60-140 bpm with minimal counts in bins to 180 bpm during AF.  She contacted me with questions about 10/10/2019 after 11pm: episode  logs show atrial fibrillation from about 1 am to about 6 am on 10/11/2019.   At her visit 10/20/2019, she reported fatigue and low blood pressure that prompted her to decrease her metoprolol back to 12.5 mg twice daily.  She got the first dose of Pfizer Covid-19 vaccine in 06/2019 and had atrial fibrillation that night -- she planned to defer the second shot for the time being. After discussion, she decided to proceed with ablation for the atrial fibrillation.   The ablation procedure was performed 12/21/2019.  All 4 PVs were isolated.   She was discharged the following day.     Remote transmission from her device 02/01/2020 recorded 2 episodes of non-sustained AF since the ablation, on 01/23/2020 for 9 seconds and on 01/26/2020 for 13 seconds -- however, EGMs appeared consistent with brief oversensing.   At follow-up in 01/2020, she was feeling "OK" overall, with days when she felt very "tired" and "out of it."  She had some palpitations that didn't feel like atrial fibrillation. Device interrogation showed no arrhythmia episodes, and I recommended she continue metoprolol therapy.  She discontinued apixaban 03/22/2020 (3 months post-procedure)   She contacted me in 03/2020 with episodes of palpitations and brief episodes of sharp, "shooting chest pain."  Device interrogation showed no major arrhythmia episodes.  There was a total of 24 minutes of atrial fibrillation from 8/9 through 10/1, but none lasting more than a minute (so no recordings to review).  We decided to proceed with Zio monitoring.   The Zio and subsequent interrogations showed no atrial fibrillation.     She developed Covid 07/06/2020.  She subsequently reported sensations like she was not getting enough air (different than atrial fibrillation).   Device interrogation 07/19/2020 showed 4 episodes of AT/AF since 06/14/2020, the longest 30 seconds on 07/12/2020 at 3:23pm.  EGMs all showed atrial oversensing.   At follow-up 07/26/2020, she was feeling well and had recovered from Covid. She felt that the atrial fibrillation symptoms were better since the ablation in 11/2019, and there were no true atrial fibrillation events on pacemaker interrogation.  She did report episodes of "strong" heart beats she thought could be NSVT. She was planning to reduce her work hours and switch to [employer]. We agreed to continue monitoring arrhythmia burden via her pacemaker, and increase metoprolol if NSVT episodes became more frequent or bothersome.        Today, Ms. Helinski         PAST MEDICAL HISTORY      Past Medical History:   Diagnosis Date    Anticoagulated     Chronotropic incompetence 10/18/2015    History of motion sickness     IBS (irritable bowel syndrome) 10/18/2015    IBS (irritable bowel syndrome)     Mobitz type 2 second degree atrioventricular block 10/18/2015    s/p pacemaker     Pacemaker 11/29/2015    RBBB 10/18/2015    Thyroid nodule 10/18/2015    since 2003       CURRENT MEDICATIONS     Your Medications at the End of This Visit       Disp Refills Start End    lansoprazole (PREVACID) 30 mg capsule 14 capsule 0 12/23/2019     Sig - Route: Take 1 capsule (30 mg total) by mouth daily Please take medication for two weeks then stop. - Oral    Renewals     Renewal requests to authorizing provider Webb Silversmith Gevena Barre, NP) <b>prohibited</b>  metoprolol tartrate (LOPRESSOR) 25 mg tablet        Sig - Route: Take 12.5 mg by mouth Twice a day    - Oral    Class: Historical Med          ALLERGIES     Allergies as of 07/27/2021  Review status set to Review Complete by Virgel Bouquet, MD on 07/26/2020      Severity Noted Reaction Type Reactions    Sulfamethoxazole-trimethoprim Not Specified 10/18/2015    Itching    Insomnia          SOCIAL HISTORY     She reports that she has never smoked. She has never used smokeless tobacco. She reports current alcohol use. She reports that she does not use drugs.    She is divorced.  She has 3 children.  She lives in [place], [place].  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    She has 2 horses (a Engineer, water and a [place], ages 50 and 64).  She also wakeboards in the summer.    FAMILY HISTORY     Her father underwent CABG in his late 61's and has cardiomyopathy.  He was a heavy smoker. He passed away in early 02-Aug-2018.    Her mother had a heart attack around age 50.  She died after complications from a head injury in June 2016 after being knocked down by a horse.  Her grandfather had colon cancer.    REVIEW OF SYSTEMS     Gastrointestinal:  + IBS  with cramping and gas.  No blood in the stool.  + polyps on colonoscopy in 08/02/2013.    PHYSICAL EXAMINATION     Constitutional: Patient appears well-developed and well-nourished. Pleasant and appropriately interactive.  Head: Normocephalic and atraumatic.   Neck: Normal range of motion.   Pulmonary/Chest: Effort normal. No respiratory distress. No cough.  Neurological: Alert and oriented to person, place, and time.   Psychiatric: Normal mood and affect. Behavior is normal. Judgment and thought content normal.   Skin: No visible rash.     DATA     I reviewed prior 12-lead EKGs:  - 12/21/2019: sinus rhythm with right bundle branch block at 75 bpm  - 04/02/2017: competing sinus rhythm and atrial pacing at 60 bpm with intact AV conduction and right bundle branch block (QRSd 124 msec).  - 10/02/2016: atrial pacing at 61 bpm with intact AV conduction and right bundle branch block  - 11/29/2015: normal sinus rhythm with right bundle branch block at 64 bpm.  - 10/18/2015: sinus bradycardia at 57 bpm with right bundle branch block  - 08/29/2015: sinus bradycardia at 57 bpm with right bundle branch block.    I reviewed prior pacemaker interrogations:   06/15/2021 (remote): Vp 14.9%.  No arrhythmia episodes since 05/30/2021   05/30/2021 (remote): Several AT/AF episodes, with EGMs showing atrial oversensing and not true atrial fibrillation. Atrial sensitivity decreased (0.45 mV to 0.9 mV)   07/19/2020 (remote): 4 episodes of AT/AF since 06/14/2020, the longest 30 seconds on 07/12/2020 at 3:23pm.  There was one 8 beat episode of NSVT on 06/25/2020 at about 5am.  AT/AF EGMs all showed atrial oversensing and not true atrial fibrillation:       02/01/2020 (remote): 2 episodes of non-sustained AF since the ablation, on 01/23/2020 for 9 seconds and on 01/26/2020 for 13 seconds, but EGMs appeared consistent with brief oversensing and not true atrial fibrillation.   10/12/2019 (remote):  54 high rate atrial episodes, with 46% A-pacing and 4% V-pacing.   AT/AF burden was 3% -- the longest episode was 08/01/2019 for 6 hours and 24 minutes (though other shorter episodes may have been part of one longer episode, including on 10/11/2019).  Histograms show predominant V rates 60-140 bpm with minimal counts in bins to 180 bpm during AF.     04/16/2019 (remote): normal overall function, with 48% atrial pacing and 2.9% ventricular pacing.  Total atrial fibrillation burden was 1.7% since 01/2019, with longest episode 3 hours 40 minutes on 03/11/2019.  There were multiple episodes 10/18-10/19/2020.  The longest episode within that time period measured formally was 1 h 20 min, though based on the log I suspect she was continuously in atrial fibrillation from ~midnight until ~9am on 04/13/2019.   11/18/2018 (see separate full report in [facility]):    - She paces 45.4% in the RA (stable) and 4.6% in the RV (stable).    - Atrial fibrillation burden has been 0.9%, the longest episode lasting 5 hours on 11/07/2018 with max V-rate of 130 bpm (max V-rate during all atrial fibrillation episodes 133 bpm)  - No NSVT recorded  - Many longer episodes were overnight    I reviewed the report and tracings from a [employer] monitor worn 6/8-6/23/2022, which showed:  * The observed rhythms are paced rhythm with intrinsic beat.  * The Maximum Heart Rate recorded was 150 bpm, Day 6 / 05:30:53 pm, the Minimum Heart Rate recorded was 59 bpm, Day 3 / 04:00:42 pm and the Average Heart Rate was 66 bpm.  * There were 132 PVCs with a burden of 0.01 %.  * There were 4 PSVCs with a burden of < 0.01 %. There was 1 occurrences of Supraventricular Tachycardia with the longest episode 4 beats, Day 6 / 05:30:57 pm and the fastest episode 150 bpm, Day 6 / 05:30:57 pm.  * QTcB >460 ms 59.83%  * There were 0 Patient reported events.    I reviewed the report and tracings from a Zio patch worn 10/13-10/27/2021, which showed:  - Patient had a min HR of 60 bpm, max HR of 103 bpm, and avg HR of 66 bpm. Predominant underlying  rhythm was Sinus Rhythm.   - Isolated SVEs were rare (<1.0%), SVE Couplets were rare (<1.0%), and SVE Triplets were rare (<1.0%).   - Isolated VEs were rare (<1.0%), VE Couplets were rare (<1.0%), and no VE Triplets were present.  - Final interpretation:  1. Predominant rhythm is sinus. Intermittent atrial and ventricular pacing noted.  2. Supraventricular and ventricular ectopic burden as above.  3. Single patient triggered event demonstrates sinus rhythm.  4. No identified atrial fibrillation.    I reviewed the report and tracings from a 24-hour Holter monitor 08/31/2015 [facility]):  - Average HR 64.  Minimum 38.  Maximum 97.  Predominant rhythm sinus.  - Single PVC's occasional; no runs.  - Frequent blocked PACs with resultant bradycardia.  No SVT or atrial fibrillation.    - Occasional shortness of breath during periods of bradycardia with blocked PACs.  - On my review of tracings, several strips appear more consistent with sinus rhythm with 2:1 block than with blocked PAC's based on timing (though P-wave morphology is subtly different with blocked beats).  On tracings with both 2:1 and 1:1 conduction, the P-wave CL remains constant and ventricular rate exactly 1/2 during 2:1 block.  When transitioning from 1:1 to 2:1 block, the first blocked P-wave  comes slightly early, but possibly due to increase in sinus rate.  - Less frequently (particularly 2:26am), bradycardia appears due to blocked PAC's in bigeminy.  - One period of 3:2 conduction (9:28am) without clear PR prolongation    I reviewed the reports of the following tests:   Echocardiogram 10/31/2018 ([place]):       Cardiac PET 02/17/2018:  1.   Normal perfusion in the left ventricle without myocardial uptake of FDG. Findings are suggestive of no active myocardial inflammatory process or scar.  2.    Normal left ventricular wall motion with a calculated left ventricular ejection fraction of >65% on rest perfusion  images.     Cardiac MRI 12/31/2017:  1. Mid wall and subepicardial late gadolinium enhancement in the lateral wall at the level of the base which can be seen with myocarditis or other inflammatory cardiomyopathy. Remaining myocardium demonstrates no evidence of scar.  2. Normal left ventricular function (ejection fraction 73%).     Echocardiogram 04/02/2017:  The patient's blood pressure was 154 mmHg/70 mmHg during the study. The heart rate during the study was 73. Color Flow Doppler was utilized for this exam. Spectral Doppler was utilized for this exam.  1. The left ventricular volume is normal. LV function is hyperdynamic. LV ejection fraction is estimated to be 70 to 75%.  2. The right ventricular volume is normal. Right ventricular function is normal.  3. Left atrial size is normal. Right atrial size is normal.  4. There is no hemodynamically significant valvular disease.  5. There is no Doppler evidence of LV diastolic dysfunction.  6. The RV systolic pressure is estimated to be 23 mmHg based on a right atrial pressure of 3 mmHg.  7. No pericardial effusion noted. The inferior vena cava is less than 21 mm in diameter and collapses with inspiration consistent with a right atrial pressure of 3 mmHg.  8. Aortic root dimension is normal.  - Compared to the previous study on 09/23/15, the prior study is a stress echo.     Exercise stress echocardiogram 09/23/2015 [facility]):  - Resting EKG sinus rhythm with right bundle branch block   - Resting echocardiogram: Normal LV size, wall thickness, and function, EF 60-65%.  Atria normal size.  RV normal.  Mild anterior leaflet MVP and mild MR.  No other significant valvular pathology.  No pericardial effusion.  - Walked 8 min 11 sec on [place], with peak HR 118 bpm early but development of 2:1 AV block at peak exercise with HR in the 60s and associated shortness of breath (2:1 block versus atrial bigeminy with block).  Returned to 1:1 conduction in recovery.   Episodes of supraventricular bigeminy noted.  - I reviewed the tracings:  - At 2:24, there is a period of 3:2 conduction with constant P-P interval and no clear PR prolongation, that appears to persist through 5:50  - HR then drops from 89 to 74 but P-waves difficult to see  - At 6:50 there appears to be 2:1 block with occasional outflow tract PVC's  - At 7:50 there is suspicion for 2:1 conduction with blocked P-waves in prior T-waves.  - At 15 seconds into recovery there are periods with 1:1 conduction at double the rate of final exercise EKGs (5:4 and 6:5 conduction, but with little to no change in PR interval and R-R interval around blocked P-wave exactly double that of conducted intervals).  This apparent Mobitz type II second degree AV block becomes more  apparent as the rate continues to slow in recovery.  - By 2 minutes into recovery there is clear 2:1 AV block that persists to 3 minutes, though with one episode of 3:2 block that appears slightly more consistent with Wenckebach based on the PR intervals and length of pause  - Stress echocardiogram:  Appropriate increase in contractility of all segments; no evidence of ischemia.     Chest CTA 09/22/2015 [facility]):  - Negative for PE or other explanation for shortness of breath.  - Few liver lesions, probably cysts.    - Ascending and descending aorta normal caliber     PFTs 09/08/2015:  - FVC, FEV1, FEV1/FVC ratio, and FEF25-75% within normal limits  - TLC within normal limits  - FRC and RV increased  - With bronchodilators, there is significant response.  - Diffusing capacity high.  - Conclusions: Increased diffusing capacity consistent with left heart failure or shunting.  Although flow rates are within normal limits, the overinflation and response to bronchodilators are characteristic of reactive airways ("minimal")    I reviewed laboratory evaluation from [facility] dated 04/14/2019:  Na 141, K 3.8, Cl 105, CO2 32, BUN 17, Cr 0.6, Glu 100  LFTs  normal (alk phos low at 44)  WBC 3.7, Hb 13.5, Plt 143  TnI <0.012  nT-BNP 238  U/A negative      ASSESSMENT AND RECOMMENDATIONS     In summary, Ms. Rahe is a 67 y.o. 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  She was subsequently diagnosed with paroxysmal atrial fibrillation, as well as occasional NSVT, and underwent ablation for atrial fibrillation 12/21/2019.  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    We focused our evaluation and discussion today on the following problems:    1) Atrial fibrillation:  - She had a prior history of relatively infrequent palpitations and was been diagnosed with paroxysmal atrial fibrillation via device interrogation.  The burden continued to slowly increase, and she was symptomatic with longer episodes.  She decided to proceed with catheter ablation, performed 12/21/2019 -- device interrogation has shown no episodes of true atrial fibrillation since, and her symptoms are markedly improved.    We agreed on the following plan:  - We will continue to monitor arrhythmia burden via the implanted device  - She will continue metoprolol    2) NSVT:  - She had a symptomatic, 20-beat run of NSVT in 08/2017 associated with significant lightheadedness.  We initiated the metoprolol after that, with improvement in NSVT burden, though she continues to have brief, occasional episodes.  - Given the combination of AV block, sinus node dysfunction, paroxysmal atrial fibrillation, and NSVT, I suggested further evaluation for inflammatory cardiomyopathy such as sarcoid.  MRI showed evidence of basal-lateral mid-myocardial and epicardial LGE (with preserved LV function), and subsequent PET  showed no perfusion defects or evidence of inflammation.  The etiology of the LGE is not clear -- perhaps she had a prior episode of myocarditis.    - The combination of the LGE and NSVT is obviously concerning, though she has normal LV function, no sustained ventricular arrhythmias, and no syncope.  Since starting low dose metoprolol she has had only infrequent NSVT. I do not  think there is an indication for ICD upgrade at this time.   - If NSVT episodes become more frequent or bothersome, it would be reasonable to try increasing the very low dose metoprolol further.    3) Stroke prevention:  - Her CHA2DS2-VASc score is 1, for gender only (but will be 2 when she turns 36 in March 2022).  This corresponds to a yearly stroke risk of 1.5% with no therapy, 1.2% with aspirin alone, and 0.3-0.5% on therapeutic anticoagulation.  - She stopped apixaban 3 months post-procedure, in part given her preference as someone who enjoys riding horses.  Since we are able to monitor for atrial fibrillation via the device, and her only risk factors will be age and gender, I think it will be safe to remain off therapeutic anticoagulation for the time being.  We will have a low threshold to resume if she has recurrent atrial fibrillation.    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  Her ventricular pacing burden has remained low overall.  - She will continue routine pacemaker checks remotely and in the clinic.    5) Possible sinus node dysfunction:  - She may have some degree of chronotropic incompetence that contributes to exertional dyspnea, but felt poorly with rate-response enabled.  We could consider another trial of rate-response with less aggressive settings in the future.      It is a pleasure participating in Ms. Shinsky's care. I spent a total of *** minutes on her care on the day of her visit excluding time  spent related to any billed procedures. This time includes face-to-face time with the patient as well as time spent documenting in the medical record, reviewing the patient's records and tests, obtaining history, placing orders, communicating with other healthcare professionals, counseling the patient, family, or caregiver, and/or care coordination for the diagnoses above.    Please do not hesitate to contact me with any questions or concerns.    Estrellita Ludwig. Manus Rudd, MD, [facility], [facility]  Professor of Clinical Medicine  Division of Cardiology  Cardiac Electrophysiology

## 2021-08-02 ENCOUNTER — Other Ambulatory Visit: Payer: Medicare Other

## 2021-08-02 ENCOUNTER — Other Ambulatory Visit: Payer: Self-pay

## 2021-08-02 DIAGNOSIS — E1169 Type 2 diabetes mellitus with other specified complication: Secondary | ICD-10-CM

## 2021-08-03 LAB — HEMOGLOBIN A1C
Hgb A1c MFr Bld: 7.8 % of total Hgb — ABNORMAL HIGH (ref <5.7)
Mean Plasma Glucose: 177 mg/dL
eAG (mmol/L): 9.8 mmol/L

## 2021-08-03 LAB — COMPLETE METABOLIC PANEL WITH GFR
AG Ratio: 1.4 (calc) (ref 1.0–2.5)
ALT: 17 U/L (ref 6–29)
AST: 14 U/L (ref 10–35)
Albumin: 4.2 g/dL (ref 3.6–5.1)
Alkaline phosphatase (APISO): 77 U/L (ref 37–153)
BUN: 11 mg/dL (ref 7–25)
CO2: 24 mmol/L (ref 20–32)
Calcium: 9.1 mg/dL (ref 8.6–10.4)
Chloride: 108 mmol/L (ref 98–110)
Creat: 0.68 mg/dL (ref 0.50–1.05)
Globulin: 2.9 g/dL (calc) (ref 1.9–3.7)
Glucose, Bld: 141 mg/dL — ABNORMAL HIGH (ref 65–99)
Potassium: 4 mmol/L (ref 3.5–5.3)
Sodium: 142 mmol/L (ref 135–146)
Total Bilirubin: 0.3 mg/dL (ref 0.2–1.2)
Total Protein: 7.1 g/dL (ref 6.1–8.1)
eGFR: 97 mL/min/{1.73_m2} (ref >=60)

## 2021-08-04 ENCOUNTER — Ambulatory Visit (INDEPENDENT_AMBULATORY_CARE_PROVIDER_SITE_OTHER): Payer: Medicare Other | Admitting: Nurse Practitioner

## 2021-08-04 ENCOUNTER — Encounter: Payer: Self-pay | Admitting: Nurse Practitioner

## 2021-08-04 ENCOUNTER — Other Ambulatory Visit: Payer: Self-pay

## 2021-08-04 ENCOUNTER — Telehealth: Payer: Self-pay | Admitting: *Deleted

## 2021-08-04 VITALS — BP 130/80 | HR 87 | Temp 97.1°F | Ht 63.0 in | Wt 170.8 lb

## 2021-08-04 DIAGNOSIS — G40909 Epilepsy, unspecified, not intractable, without status epilepticus: Secondary | ICD-10-CM

## 2021-08-04 DIAGNOSIS — E118 Type 2 diabetes mellitus with unspecified complications: Secondary | ICD-10-CM

## 2021-08-04 DIAGNOSIS — I10 Essential (primary) hypertension: Secondary | ICD-10-CM

## 2021-08-04 DIAGNOSIS — D509 Iron deficiency anemia, unspecified: Secondary | ICD-10-CM

## 2021-08-04 DIAGNOSIS — E1169 Type 2 diabetes mellitus with other specified complication: Secondary | ICD-10-CM

## 2021-08-04 DIAGNOSIS — E785 Hyperlipidemia, unspecified: Secondary | ICD-10-CM

## 2021-08-04 MED ORDER — LINAGLIPTIN 5 MG PO TABS: 5.0000 mg | ORAL_TABLET | Freq: Every day | ORAL | 1 refills | Status: DC

## 2021-08-04 NOTE — Progress Notes (Signed)
Careteam: Patient Care Team: Lauree Chandler, NP as PCP - General (Geriatric Medicine)  PLACE OF SERVICE:  [facility] Directive information Does Patient Have a Medical Advance Directive?: Yes, Type of Advance Directive: Out of facility DNR (pink MOST or yellow form), Pre-existing out of facility DNR order (yellow form or pink MOST form): Pink MOST form placed in chart (order not valid for inpatient use), Does patient want to make changes to medical advance directive?: No - Patient declined  Allergies  Allergen Reactions   Simvastatin Other (See Comments)    Stomach pain, low back pain    Shrimp [Shellfish Allergy] Other (See Comments)    Swelling and hypersensitivity    Chief Complaint  Patient presents with   Medical Management of Chronic Issues    3 month follow up. Discuss lab results. Patient working on getting eye exam   Health Maintenance    Tetanus/tdap, foot exam, 2nd COVID booster, eye exam, mammogram and dexa scan.     HPI: Patient is a 66 y.o. female for routine follow up  DM- she was not able to tolerate glipizide due to hypoglycemia. Working on dietary modifications and increase in physical activity. Just got her glucose monitor at home.  She has been guessing on what her blood sugars have been.  No  more hypoglycemia noted  Has lost 2 lbs since last visit.    Review of Systems:  Review of Systems  Constitutional:  Negative for chills, fever and weight loss.  HENT:  Negative for tinnitus.   Respiratory:  Negative for cough, sputum production and shortness of breath.   Cardiovascular:  Negative for chest pain, palpitations and leg swelling.  Gastrointestinal:  Negative for abdominal pain, constipation, diarrhea and heartburn.  Genitourinary:  Negative for dysuria, frequency and urgency.  Musculoskeletal:  Negative for back pain, falls, joint pain and myalgias.  Skin: Negative.   Neurological:  Negative for dizziness, seizures and headaches.   Psychiatric/Behavioral:  Negative for depression and memory loss. The patient does not have insomnia.    Past Medical History:  Diagnosis Date   Allergy    Anemia, unspecified    Asthma    as a child, no problem as adult, no inhaler   Bradycardia    past hx    Diabetes mellitus    Type II   History of blood transfusion    pre hysterectomy   Osteoarthrosis, unspecified whether generalized or localized, unspecified site    Other and unspecified hyperlipidemia    Other convulsions    last one 2013   Other malaise and fatigue    Other specified cardiac dysrhythmias(427.89) 2006   "history of Bradycardia" .  wore a mointor nothing found   Seizure disorder ([place])    last seizure 2013 per pt 05-12-2019 PV    Unspecified essential hypertension    Unspecified vitamin D deficiency    Past Surgical History:  Procedure Laterality Date   ABDOMINAL HYSTERECTOMY     APPENDECTOMY     BREAST LUMPECTOMY Right 1997   BREAST LUMPECTOMY Bilateral    neg for cancer   CHOLECYSTECTOMY N/A 02/28/2016   Procedure: LAPAROSCOPIC CHOLECYSTECTOMY;  Surgeon: Mickeal Skinner, MD;  Location: [place];  Service: General;  Laterality: N/A;   IR CATHETER TUBE CHANGE  01/02/2018   IR RADIOLOGIST EVAL & MGMT  12/25/2017   IR RADIOLOGIST EVAL & MGMT  01/08/2018   LAPAROSCOPIC APPENDECTOMY N/A 01/15/2018   Procedure: LAPAROSCOPIC APPENDECTOMY CONVERTED TO OPEN  APPENDECTOMY SMALL BOWEL RESECTION;  Surgeon: Donnie Mesa, MD;  Location: [place];  Service: General;  Laterality: N/A;   SUPRA-UMBILICAL HERNIA N/A 0/25/4270   Procedure: OPEN REPAIR OF SUPRA-UMBILICAL VENTRAL HERNIA WITH MESH;  Surgeon: Donnie Mesa, MD;  Location: [place];  Service: General;  Laterality: N/A;   Social History:   reports that she has never smoked. She has never used smokeless tobacco. She reports that she does not drink alcohol and does not use drugs.  Family History  Problem Relation Age of Onset   Diabetes Father    Kidney failure  Father    Heart disease Father    Hypertension Father    Seizures Father    Colon polyps Neg Hx    Colon cancer Neg Hx    Rectal cancer Neg Hx    Stomach cancer Neg Hx     Medications: Patient's Medications  New Prescriptions   LINAGLIPTIN (TRADJENTA) 5 MG TABS TABLET    Take 1 tablet (5 mg total) by mouth daily.  Previous Medications   ACCU-CHEK FASTCLIX LANCETS MISC    Use to test blood sugar twice daily. Dx: E11.9   AMLODIPINE (NORVASC) 10 MG TABLET    TAKE 1 TABLET EVERY DAY   BIOTIN PO    Take 1 tablet by mouth daily.   BLOOD GLUCOSE MONITORING SUPPL (ACCU-CHEK GUIDE) W/DEVICE KIT    Use to test blood sugar twice daily. Dx: E11.9   CETIRIZINE (ZYRTEC) 10 MG TABLET    Take 10 mg by mouth at bedtime.   COLLAGEN PO    Take by mouth daily. Powder formula, adds to coffee   EZETIMIBE (ZETIA) 10 MG TABLET    TAKE 1 TABLET EVERY DAY   FERROUS SULFATE 325 (65 FE) MG TABLET    Take 1 tablet (325 mg total) by mouth 2 (two) times daily with a meal.   GLUCOSE BLOOD (ACCU-CHEK GUIDE) TEST STRIP    Use to test blood sugar twice daily. Dx: E11.9   LANCETS MISC. (ACCU-CHEK FASTCLIX LANCET) KIT    by Does not apply route.   LEVETIRACETAM (KEPPRA) 500 MG TABLET    TAKE 1 TABLET BY MOUTH EVERY 12 (TWELVE) HOURS FOR SEIZURES   LISINOPRIL (ZESTRIL) 30 MG TABLET    TAKE 1 TABLET EVERY DAY FOR BLOOD PRESSURE   METFORMIN (GLUCOPHAGE) 1000 MG TABLET    TAKE 1 TABLET TWICE DAILY WITH A MEAL TO CONTROL BLOOD SUGAR   ROSUVASTATIN (CRESTOR) 5 MG TABLET    Take 0.5 tablets (2.5 mg total) by mouth at bedtime.   ZOSTER VACCINE ADJUVANTED [facility]) INJECTION    Inject 0.5 mLs into the muscle daily.  Modified Medications   No medications on file  Discontinued Medications   No medications on file    Physical Exam:  Vitals:   08/04/21 0928  BP: 130/80  Pulse: 87  Temp: (!) 97.1 F (36.2 C)  SpO2: 97%  Weight: 170 lb 12.8 oz (77.5 kg)  Height: 5' 3"  (1.6 m)   Body mass index is 30.26 kg/m. Wt  Readings from Last 3 Encounters:  08/04/21 170 lb 12.8 oz (77.5 kg)  06/12/21 172 lb 3.2 oz (78.1 kg)  05/01/21 164 lb (74.4 kg)    Physical Exam Constitutional:      General: She is not in acute distress.    Appearance: She is well-developed. She is not diaphoretic.  HENT:     Head: Normocephalic and atraumatic.  Eyes:     Conjunctiva/sclera: Conjunctivae normal.  Pupils: Pupils are equal, round, and reactive to light.  Cardiovascular:     Rate and Rhythm: Normal rate and regular rhythm.     Heart sounds: Normal heart sounds.  Pulmonary:     Effort: Pulmonary effort is normal.     Breath sounds: Normal breath sounds.  Musculoskeletal:     Cervical back: Normal range of motion and neck supple.     Right lower leg: No edema.     Left lower leg: No edema.  Skin:    General: Skin is warm and dry.  Neurological:     Mental Status: She is alert.  Psychiatric:        Mood and Affect: Mood normal.    Labs reviewed: Basic Metabolic Panel: Recent Labs    12/12/20 1555 05/01/21 0848 08/02/21 0834  NA 141 138 142  K 3.7 3.6 4.0  CL 107 104 108  CO2 26 23 24   GLUCOSE 112* 240* 141*  BUN 12 13 11   CREATININE 0.78 0.95 0.68  CALCIUM 9.3 9.5 9.1   Liver Function Tests: Recent Labs    12/12/20 1555 05/01/21 0848 08/02/21 0834  AST 19 14 14   ALT 18 15 17   BILITOT 0.4 0.5 0.3  PROT 7.1 7.3 7.1   No results for input(s): LIPASE, AMYLASE in the last 8760 hours. No results for input(s): AMMONIA in the last 8760 hours. CBC: Recent Labs    12/12/20 1555 05/01/21 0848  WBC 7.3 6.7  NEUTROABS 3,730 3,645  HGB 11.9 12.5  HCT 37.2 38.5  MCV 81.4 81.1  PLT 317 300   Lipid Panel: Recent Labs    12/12/20 1555  CHOL 140  HDL 71  LDLCALC 50  TRIG 105  CHOLHDL 2.0   TSH: No results for input(s): TSH in the last 8760 hours. A1C: Lab Results  Component Value Date   HGBA1C 7.8 (H) 08/02/2021     Assessment/Plan 1. Seizure disorder ([place]) -well controlled  no recurrent seizures noted.   2. Iron deficiency anemia, unspecified iron deficiency anemia type Will follow up cbc with next lab, continues on supplement  3. Essential hypertension --stable. Goal bp <140/90. Continue on current regimen with low sodium diet.  - CBC with Differential/Platelet; Future - CMP with eGFR([facility]); Future  4. Hyperlipidemia associated with type 2 diabetes mellitus ([place]) -continues on  crestor and zetia with dietary modifications.  - Lipid Panel; Future  5. Controlled type 2 diabetes mellitus with complication, without long-term current use of insulin ([place]) -Encouraged dietary compliance, routine foot care/monitoring and to keep up with diabetic eye exams through ophthalmology  -a1c not at goal, continues on  - Hemoglobin A1c; Future - linagliptin (TRADJENTA) 5 MG TABS tablet; Take 1 tablet (5 mg total) by mouth daily.  Dispense: 30 tablet; Refill: 1    Return in about 3 months (around 11/01/2021) for routine followup, labs prior to visit . Carlos American. [place], [facility] [phone]

## 2021-08-04 NOTE — Telephone Encounter (Signed)
Patient called and stated that the Tradjenta prescribed today is going to be over $400, too high. Stated that the pharmacy told her a paper would need to be submitted.     Prior Authorization for Tradjenta initiated through Cover My Meds. [id number] Went in determination, plan will fax with determination within 1-5 business days.  Awaiting Determination.   Patient Aware.

## 2021-08-04 NOTE — Patient Instructions (Signed)
Call to schedule bone density and mammogram [phone]  To start tradjenta 5 mg daily for blood sugar- call if this is too expensive, we can send in another medication to help get you to goal.  Continue to work on dietary modifications.

## 2021-10-31 ENCOUNTER — Other Ambulatory Visit: Payer: Medicare Other

## 2021-11-03 ENCOUNTER — Ambulatory Visit: Payer: Medicare Other | Admitting: Nurse Practitioner

## 2022-03-05 ENCOUNTER — Other Ambulatory Visit: Payer: Self-pay | Admitting: Nurse Practitioner

## 2022-03-05 DIAGNOSIS — I10 Essential (primary) hypertension: Secondary | ICD-10-CM

## 2022-03-05 DIAGNOSIS — E1169 Type 2 diabetes mellitus with other specified complication: Secondary | ICD-10-CM

## 2022-05-07 ENCOUNTER — Ambulatory Visit (INDEPENDENT_AMBULATORY_CARE_PROVIDER_SITE_OTHER): Payer: Medicare Other | Admitting: Family

## 2022-05-07 ENCOUNTER — Encounter: Payer: Self-pay | Admitting: Family

## 2022-05-07 VITALS — BP 128/70 | HR 88 | Temp 98.2°F | Resp 16 | Ht 63.0 in | Wt 164.1 lb

## 2022-05-07 DIAGNOSIS — I10 Essential (primary) hypertension: Secondary | ICD-10-CM

## 2022-05-07 DIAGNOSIS — G40909 Epilepsy, unspecified, not intractable, without status epilepticus: Secondary | ICD-10-CM

## 2022-05-07 DIAGNOSIS — Z91013 Allergy to seafood: Secondary | ICD-10-CM

## 2022-05-07 DIAGNOSIS — Z860100 Personal history of colon polyps, unspecified: Secondary | ICD-10-CM | POA: Insufficient documentation

## 2022-05-07 DIAGNOSIS — E785 Hyperlipidemia, unspecified: Secondary | ICD-10-CM

## 2022-05-07 DIAGNOSIS — K141 Geographic tongue: Secondary | ICD-10-CM

## 2022-05-07 DIAGNOSIS — E1169 Type 2 diabetes mellitus with other specified complication: Secondary | ICD-10-CM | POA: Diagnosis not present

## 2022-05-07 DIAGNOSIS — E119 Type 2 diabetes mellitus without complications: Secondary | ICD-10-CM

## 2022-05-07 DIAGNOSIS — Z79899 Other long term (current) drug therapy: Secondary | ICD-10-CM | POA: Diagnosis not present

## 2022-05-07 DIAGNOSIS — Z780 Asymptomatic menopausal state: Secondary | ICD-10-CM

## 2022-05-07 DIAGNOSIS — K5790 Diverticulosis of intestine, part unspecified, without perforation or abscess without bleeding: Secondary | ICD-10-CM

## 2022-05-07 DIAGNOSIS — Z0000 Encounter for general adult medical examination without abnormal findings: Secondary | ICD-10-CM

## 2022-05-07 DIAGNOSIS — Z1231 Encounter for screening mammogram for malignant neoplasm of breast: Secondary | ICD-10-CM

## 2022-05-07 DIAGNOSIS — D509 Iron deficiency anemia, unspecified: Secondary | ICD-10-CM

## 2022-05-07 MED ORDER — EPINEPHRINE 0.3 MG/0.3ML IJ SOAJ: 0.3000 mg | INTRAMUSCULAR | 0 refills | Status: DC | PRN

## 2022-05-07 NOTE — Progress Notes (Signed)
New Patient Office Visit  Subjective:  Patient ID: Sarah Brock, female    DOB: Dec 12, 1955  Age: 66 y.o. MRN: [id number]  CC:  Chief Complaint  Patient presents with   Establish Care    HPI Sarah Brock is here to establish care as a new patient.  Prior provider was: Sherrie Mustache, PCP  Pt is without acute concerns.   chronic concerns:  DM2: metformin 1000 mg twicgeographe daily and tradjenta 5 mg tablets however she states was too expensive. States had appt to be seen by opthalmology however had to wait 2-3 months to be seen, and so didn't schedule.  Lab Results  Component Value Date   HGBA1C 7.8 (H) 08/02/2021    HTN: on lisinopril 30 mg and amlodipine 10 mg. Blood pressure 128/70.  No cp palp or sob. Has lost some weight which has helped.  Wt Readings from Last 3 Encounters:  05/07/22 164 lb 2 oz (74.4 kg)  08/04/21 170 lb 12.8 oz (77.5 kg)  06/12/21 172 lb 3.2 oz (78.1 kg)   Seizures: last seizure event was maybe 7-8 years ago, has since been controlled on keppra 500 mg po bid.   Hyperlipidemia: tolerating crestor 2.5 mg once daily as well as zetia 10 mg at bedtime.   IDA: on ferrous sulfate 325 mg once daily.    Past Medical History:  Diagnosis Date   Allergy    Anemia, unspecified    Asthma    as a child, no problem as adult, no inhaler   Bradycardia    past hx    Diabetes mellitus    Type II   History of blood transfusion    pre hysterectomy   Osteoarthrosis, unspecified whether generalized or localized, unspecified site    Other and unspecified hyperlipidemia    Other convulsions    last one 2013   Other malaise and fatigue    Other specified cardiac dysrhythmias(427.89) 2006   "history of Bradycardia" .  wore a mointor nothing found   Seizure disorder ([place])    last seizure 2013 per pt 05-12-2019 PV    Unspecified essential hypertension    Unspecified vitamin D deficiency     Past Surgical History:  Procedure Laterality Date    ABDOMINAL HYSTERECTOMY     still with bil ovaries , taken out for uterine fibroids   APPENDECTOMY     BREAST LUMPECTOMY Right 1997   BREAST LUMPECTOMY Bilateral    neg for cancer   CHOLECYSTECTOMY N/A 02/28/2016   Procedure: LAPAROSCOPIC CHOLECYSTECTOMY;  Surgeon: Mickeal Skinner, MD;  Location: [place];  Service: General;  Laterality: N/A;   IR CATHETER TUBE CHANGE  01/02/2018   IR RADIOLOGIST EVAL & MGMT  12/25/2017   IR RADIOLOGIST EVAL & MGMT  01/08/2018   LAPAROSCOPIC APPENDECTOMY N/A 01/15/2018   Procedure: LAPAROSCOPIC APPENDECTOMY CONVERTED TO OPEN APPENDECTOMY SMALL BOWEL RESECTION;  Surgeon: Donnie Mesa, MD;  Location: [place];  Service: General;  Laterality: N/A;   SUPRA-UMBILICAL HERNIA N/A 03/70/4888   Procedure: OPEN REPAIR OF SUPRA-UMBILICAL VENTRAL HERNIA WITH MESH;  Surgeon: Donnie Mesa, MD;  Location: [place];  Service: General;  Laterality: N/A;    Family History  Problem Relation Age of Onset   Diabetes Father    Kidney failure Father    Heart disease Father    Hypertension Father    Seizures Father    Colon polyps Neg Hx    Colon cancer Neg Hx    Rectal cancer Neg  Hx    Stomach cancer Neg Hx     Social History   Socioeconomic History   Marital status: Married    Spouse name: Not on file   Number of children: Not on file   Years of education: Not on file   Highest education level: Not on file  Occupational History   Occupation: [employer]    Employer:   Tobacco Use   Smoking status: Never   Smokeless tobacco: Never  Vaping Use   Vaping Use: Never used  Substance and Sexual Activity   Alcohol use: No    Alcohol/week: 0.0 standard drinks of alcohol   Drug use: No   Sexual activity: Not Currently  Other Topics Concern   Not on file  Social History Narrative   Not on file   Social Determinants of Health   Financial Resource Strain: Not on file  Food Insecurity: Not on file  Transportation Needs: Not on file  Physical  Activity: Not on file  Stress: Not on file  Social Connections: Not on file  Intimate Partner Violence: Not on file    Outpatient Medications Prior to Visit  Medication Sig Dispense Refill   Accu-Chek FastClix Lancets MISC Use to test blood sugar twice daily. Dx: E11.9 200 each 3   amLODipine (NORVASC) 10 MG tablet TAKE 1 TABLET EVERY DAY 90 tablet 1   BIOTIN PO Take 1 tablet by mouth daily.     Blood Glucose Monitoring Suppl (ACCU-CHEK GUIDE) w/Device KIT Use to test blood sugar twice daily. Dx: E11.9 1 kit 0   cetirizine (ZYRTEC) 10 MG tablet Take 10 mg by mouth at bedtime.     COLLAGEN PO Take by mouth daily. Powder formula, adds to coffee     ezetimibe (ZETIA) 10 MG tablet TAKE 1 TABLET EVERY DAY 90 tablet 1   ferrous sulfate 325 (65 FE) MG tablet Take 1 tablet (325 mg total) by mouth 2 (two) times daily with a meal. 60 tablet 2   glucose blood (ACCU-CHEK GUIDE) test strip Use to test blood sugar twice daily. Dx: E11.9 200 each 3   Lancets Misc. (ACCU-CHEK FASTCLIX LANCET) KIT by Does not apply route.     levETIRAcetam (KEPPRA) 500 MG tablet TAKE 1 TABLET BY MOUTH EVERY 12 (TWELVE) HOURS FOR SEIZURES 180 tablet 1   lisinopril (ZESTRIL) 30 MG tablet TAKE 1 TABLET EVERY DAY FOR BLOOD PRESSURE 90 tablet 1   metFORMIN (GLUCOPHAGE) 1000 MG tablet TAKE 1 TABLET TWICE DAILY WITH A MEAL TO CONTROL BLOOD SUGAR 180 tablet 1   rosuvastatin (CRESTOR) 5 MG tablet Take 0.5 tablets (2.5 mg total) by mouth at bedtime. 45 tablet 3   linagliptin (TRADJENTA) 5 MG TABS tablet Take 1 tablet (5 mg total) by mouth daily. 30 tablet 1   Zoster Vaccine Adjuvanted [facility]) injection Inject 0.5 mLs into the muscle daily. 1 each 1   No facility-administered medications prior to visit.    Allergies  Allergen Reactions   Simvastatin Other (See Comments)    Stomach pain, low back pain    Shrimp [Shellfish Allergy] Other (See Comments)    Swelling and hypersensitivity    ROS Review of Systems  Review of  Systems  Respiratory:  Negative for shortness of breath.   Cardiovascular:  Negative for chest pain and palpitations.  Gastrointestinal:  Negative for constipation and diarrhea.  Genitourinary:  Negative for dysuria, frequency and urgency.  Musculoskeletal:  Negative for myalgias.  Psychiatric/Behavioral:  Negative for depression and  suicidal ideas.   All other systems reviewed and are negative.    Objective:    Physical Exam Vitals reviewed.  Constitutional:      General: She is not in acute distress.    Appearance: Normal appearance. She is not ill-appearing or toxic-appearing.  HENT:     Right Ear: Tympanic membrane normal.     Left Ear: Tympanic membrane normal.     Mouth/Throat:     Mouth: Mucous membranes are moist.     Pharynx: No pharyngeal swelling.     Tonsils: No tonsillar exudate.  Eyes:     Extraocular Movements: Extraocular movements intact.     Conjunctiva/sclera: Conjunctivae normal.     Pupils: Pupils are equal, round, and reactive to light.  Neck:     Thyroid: No thyroid mass.  Cardiovascular:     Rate and Rhythm: Normal rate and regular rhythm.  Pulmonary:     Effort: Pulmonary effort is normal.     Breath sounds: Normal breath sounds.  Abdominal:     General: Abdomen is flat. Bowel sounds are normal.     Palpations: Abdomen is soft.  Musculoskeletal:        General: Normal range of motion.  Lymphadenopathy:     Cervical:     Right cervical: No superficial cervical adenopathy.    Left cervical: No superficial cervical adenopathy.  Skin:    General: Skin is warm.     Capillary Refill: Capillary refill takes less than 2 seconds.  Neurological:     General: No focal deficit present.     Mental Status: She is alert and oriented to person, place, and time.  Psychiatric:        Mood and Affect: Mood normal.        Behavior: Behavior normal.        Thought Content: Thought content normal.        Judgment: Judgment normal.     BP 128/70   Pulse 88    Temp 98.2 F (36.8 C)   Resp 16   Ht _0  (1.6 m)   Wt 164 lb 2 oz (74.4 kg)   LMP  (LMP Unknown)   SpO2 97%   BMI 29.07 kg/m  Wt Readings from Last 3 Encounters:  05/07/22 164 lb 2 oz (74.4 kg)  08/04/21 170 lb 12.8 oz (77.5 kg)  06/12/21 172 lb 3.2 oz (78.1 kg)     Health Maintenance Due  Topic Date Due   OPHTHALMOLOGY EXAM  Never done   MAMMOGRAM  05/19/2011   Diabetic kidney evaluation - Urine ACR  10/28/2014   TETANUS/TDAP  06/26/2019   DEXA SCAN  Never done   Pneumonia Vaccine 96+ Years old (47 - PPSV23 or PCV20) 09/26/2021   INFLUENZA VACCINE  01/23/2022   HEMOGLOBIN A1C  01/30/2022   Medicare Annual Wellness (AWV)  06/12/2022    There are no preventive care reminders to display for this patient.  Lab Results  Component Value Date   TSH 1.29 12/16/2017   Lab Results  Component Value Date   WBC 6.7 05/01/2021   HGB 12.5 05/01/2021   HCT 38.5 05/01/2021   MCV 81.1 05/01/2021   PLT 300 05/01/2021   Lab Results  Component Value Date   NA 142 08/02/2021   K 4.0 08/02/2021   CO2 24 08/02/2021   GLUCOSE 141 (H) 08/02/2021   BUN 11 08/02/2021   CREATININE 0.68 08/02/2021   BILITOT 0.3 08/02/2021   ALKPHOS 193 (H)  12/22/2017   AST 14 08/02/2021   ALT 17 08/02/2021   PROT 7.1 08/02/2021   ALBUMIN 2.1 (L) 12/22/2017   CALCIUM 9.1 08/02/2021   ANIONGAP 10 12/04/2019   EGFR 97 08/02/2021   Lab Results  Component Value Date   CHOL 140 12/12/2020   Lab Results  Component Value Date   HDL 71 12/12/2020   Lab Results  Component Value Date   LDLCALC 50 12/12/2020   Lab Results  Component Value Date   TRIG 105 12/12/2020   Lab Results  Component Value Date   CHOLHDL 2.0 12/12/2020   Lab Results  Component Value Date   HGBA1C 7.8 (H) 08/02/2021      Assessment & Plan:   Problem List Items Addressed This Visit       Cardiovascular and Mediastinum   Essential hypertension    Continue lisinopril 30 mg and amlodipine 10 mg once daily.         Relevant Medications   EPINEPHrine 0.3 mg/0.3 mL IJ SOAJ injection   Other Relevant Orders   Hemoglobin A1c   CBC     Digestive   Diverticulosis     Endocrine   Diabetes mellitus type 2, controlled, without complications ([place])    Ordered hga1c today pending results. Work on diabetic diet and exercise as tolerated. Yearly foot exam, and annual eye exam.   Continue metformin 1000 mg twice daily.       Relevant Orders   Hemoglobin A1c   Microalbumin / creatinine urine ratio   Ambulatory referral to Ophthalmology   Hyperlipidemia associated with type 2 diabetes mellitus ([place])    Continue crestor 20 mg  Ordered lipid panel, pending results. Work on low cholesterol diet and exercise as tolerated       Relevant Medications   EPINEPHrine 0.3 mg/0.3 mL IJ SOAJ injection   Other Relevant Orders   Comprehensive metabolic panel   Lipid panel     Nervous and Auditory   Seizure disorder ([facility])    Continue keppra 500 mg twice daily.  Last episode >8 years ago.          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 Please refer to Health maintenance section. Pt advised on safe sex, wearing seatbelts in car, and proper nutrition labwork ordered today for annual Dental health: Discussed importance of regular tooth brushing, flossing, and dental visits.  Referred to ophthalmology for appt       Other Visit Diagnoses     Encounter for screening mammogram for malignant  neoplasm of breast       Relevant Orders   MM 3D SCREEN BREAST BILATERAL   Geographic tongue           Meds ordered this encounter  Medications   EPINEPHrine 0.3 mg/0.3 mL IJ SOAJ injection    Sig: Inject 0.3 mg into the muscle as needed for anaphylaxis.    Dispense:  1 each  Refill:  0    Order Specific Question:   Supervising Provider    Answer:   Diona Browner AMY E [[id number]    Follow-up: Return in about 6 months (around 11/05/2022) for f/u diabetes.    Eugenia Pancoast, FNP

## 2022-05-07 NOTE — Assessment & Plan Note (Signed)
Continue crestor 20 mg  Ordered lipid panel, pending results. Work on low cholesterol diet and exercise as tolerated

## 2022-05-07 NOTE — Assessment & Plan Note (Signed)
Patient Counseling(The following topics were reviewed):  Preventative care handout given to pt  Health maintenance and immunizations reviewed. Please refer to Health maintenance section. Pt advised on safe sex, wearing seatbelts in car, and proper nutrition labwork ordered today for annual Dental health: Discussed importance of regular tooth brushing, flossing, and dental visits.  Referred to ophthalmology for appt

## 2022-05-07 NOTE — Assessment & Plan Note (Signed)
Continue keppra 500 mg twice daily.  Last episode >8 years ago.

## 2022-05-07 NOTE — Assessment & Plan Note (Signed)
Continue ferrous sulfate otc

## 2022-05-07 NOTE — Patient Instructions (Addendum)
------------------------------------     I have created an order for lab work today during our visit.  Please schedule an appointment on your way out to return to the lab at your convenience. Please return fasting at your lab appointment (meaning you can only drink black coffee and or water prior to your appointment). I will reach out to you in regards to the labs when I receive the results.     ------------------------------------ You are due for:   Tetanus vaccination  Influenza vaccination  Pneumococcal 23 vaccination  I have sent an electronic order over to your preferred location for the following:   '[]'$   2D Mammogram  '[x]'$   3D Mammogram  '[x]'$   Bone Density   Please give this center a call to get scheduled at your convenience.   '[x]'$   [facility]      [place], [place]         Make sure to wear two piece  clothing  No lotions powders or deodorants the day of the appointment Make sure to bring picture ID and insurance card.  Bring list of medications you are currently taking including any supplements.    ------------------------------------  Welcome to our clinic, I am happy to have you as my new patient. I am excited to continue on this healthcare journey with you.  Please keep in mind Any my chart messages you send have up to a three business day turnaround for a response.  Phone calls may take up to a one full business day turnaround for a  response.   If you need a medication refill I recommend you request it through the pharmacy as this is easiest for [place] rather than sending a message and or phone call.   Due to recent changes in healthcare laws, you may see results of your imaging and/or laboratory studies on MyChart before I have had a chance to review them.  I understand that in some cases there may be results that are confusing or concerning to you. Please understand that not all results are received  at the same time and often I may need to interpret multiple results in order to provide you with the best plan of care or course of treatment. Therefore, I ask that you please give me 2 business days to thoroughly review all your results before contacting my office for clarification. Should we see a critical lab result, you will be contacted sooner.   It was a pleasure seeing you today! Please do not hesitate to reach out with any questions and or concerns.  Regards,   Eugenia Pancoast FNP-C

## 2022-05-07 NOTE — Assessment & Plan Note (Signed)
Ordered hga1c today pending results. Work on diabetic diet and exercise as tolerated. Yearly foot exam, and annual eye exam.   Continue metformin 1000 mg twice daily.

## 2022-05-07 NOTE — Assessment & Plan Note (Signed)
Continue lisinopril 30 mg and amlodipine 10 mg once daily.

## 2022-05-09 ENCOUNTER — Other Ambulatory Visit (INDEPENDENT_AMBULATORY_CARE_PROVIDER_SITE_OTHER): Payer: Medicare Other

## 2022-05-09 DIAGNOSIS — Z79899 Other long term (current) drug therapy: Secondary | ICD-10-CM | POA: Diagnosis not present

## 2022-05-09 DIAGNOSIS — E119 Type 2 diabetes mellitus without complications: Secondary | ICD-10-CM | POA: Diagnosis not present

## 2022-05-09 DIAGNOSIS — E1169 Type 2 diabetes mellitus with other specified complication: Secondary | ICD-10-CM

## 2022-05-09 DIAGNOSIS — E1165 Type 2 diabetes mellitus with hyperglycemia: Secondary | ICD-10-CM

## 2022-05-09 DIAGNOSIS — E785 Hyperlipidemia, unspecified: Secondary | ICD-10-CM

## 2022-05-09 DIAGNOSIS — I10 Essential (primary) hypertension: Secondary | ICD-10-CM | POA: Diagnosis not present

## 2022-05-09 LAB — CBC
HCT: 39.4 % (ref 36.0–46.0)
Hemoglobin: 12.7 g/dL (ref 12.0–15.0)
MCHC: 32.3 g/dL (ref 30.0–36.0)
MCV: 80.2 fl (ref 78.0–100.0)
Platelets: 305 10*3/uL (ref 150.0–400.0)
RBC: 4.91 Mil/uL (ref 3.87–5.11)
RDW: 13.3 % (ref 11.5–15.5)
WBC: 6 10*3/uL (ref 4.0–10.5)

## 2022-05-09 LAB — LIPID PANEL
Cholesterol: 106 mg/dL (ref 0–200)
HDL: 55.4 mg/dL (ref >39.00)
LDL Cholesterol: 23 mg/dL (ref 0–99)
NonHDL: 50.66
Total CHOL/HDL Ratio: 2
Triglycerides: 136 mg/dL (ref 0.0–149.0)
VLDL: 27.2 mg/dL (ref 0.0–40.0)

## 2022-05-09 LAB — COMPREHENSIVE METABOLIC PANEL
ALT: 27 U/L (ref 0–35)
AST: 27 U/L (ref 0–37)
Albumin: 4.1 g/dL (ref 3.5–5.2)
Alkaline Phosphatase: 80 U/L (ref 39–117)
BUN: 7 mg/dL (ref 6–23)
CO2: 29 mEq/L (ref 19–32)
Calcium: 9 mg/dL (ref 8.4–10.5)
Chloride: 101 mEq/L (ref 96–112)
Creatinine, Ser: 0.77 mg/dL (ref 0.40–1.20)
GFR: 80.25 mL/min (ref >60.00)
Glucose, Bld: 348 mg/dL — ABNORMAL HIGH (ref 70–99)
Potassium: 3.8 mEq/L (ref 3.5–5.1)
Sodium: 137 mEq/L (ref 135–145)
Total Bilirubin: 0.5 mg/dL (ref 0.2–1.2)
Total Protein: 7.2 g/dL (ref 6.0–8.3)

## 2022-05-09 LAB — MICROALBUMIN / CREATININE URINE RATIO
Creatinine,U: 211.9 mg/dL
Microalb Creat Ratio: 5.7 mg/g (ref 0.0–30.0)
Microalb, Ur: 12.1 mg/dL — ABNORMAL HIGH (ref 0.0–1.9)

## 2022-05-09 LAB — HEMOGLOBIN A1C: Hgb A1c MFr Bld: 13.5 % — ABNORMAL HIGH (ref 4.6–6.5)

## 2022-05-10 NOTE — Progress Notes (Signed)
Diabetes is out of control, went from 7.8 to 13.5 goal is less than 7.  Would pt be willing to start injection? Would like to try to prescribe ozempic and or mounjaro and have pt continue metformin. The injections could also help with some weight loss. Would pt be willing?

## 2022-05-11 MED ORDER — TIRZEPATIDE 2.5 MG/0.5ML ~~LOC~~ SOAJ: SUBCUTANEOUS | 0 refills | Status: DC

## 2022-05-11 MED ORDER — TIRZEPATIDE 5 MG/0.5ML ~~LOC~~ SOAJ: SUBCUTANEOUS | 2 refills | Status: DC

## 2022-05-11 NOTE — Progress Notes (Signed)
Sent in mounjaro to start, advise pt will start once weekly at 2.5 mg and ten after four weeks increase to 5 mg once a week. Continue metformin. Have pt f/u in three months to repeat

## 2022-05-11 NOTE — Addendum Note (Signed)
Addended by: Eugenia Pancoast on: 05/11/2022 02:59 PM   Modules accepted: Orders

## 2022-05-14 ENCOUNTER — Telehealth: Payer: Self-pay | Admitting: Family

## 2022-05-14 DIAGNOSIS — E1165 Type 2 diabetes mellitus with hyperglycemia: Secondary | ICD-10-CM

## 2022-05-14 MED ORDER — TIRZEPATIDE 2.5 MG/0.5ML ~~LOC~~ SOAJ: SUBCUTANEOUS | 0 refills | Status: DC

## 2022-05-14 MED ORDER — TIRZEPATIDE 5 MG/0.5ML ~~LOC~~ SOAJ: SUBCUTANEOUS | 2 refills | Status: DC

## 2022-05-14 NOTE — Telephone Encounter (Signed)
Pt called stating her prescription for tirzepatide [facility]) 2.5 MG/0.5ML Pen [[id number]] Was sent to [facility] instead of her preferred pharmacy of [facility]. Pt stated the cost of the meds from [facility] was too high & cheaper through [facility]. Pt stated [facility] is going to send meds through mail order & should be calling office to verify some info on meds. Call back # [phone]

## 2022-05-14 NOTE — Telephone Encounter (Signed)
Please advise pt this was sent to [facility] bc new start however will attempt to sent for mail order instead to see if covered.

## 2022-05-14 NOTE — Telephone Encounter (Signed)
Called pt and informed her of the RX being sent in though mail order.

## 2022-06-12 ENCOUNTER — Telehealth: Payer: Self-pay

## 2022-06-12 DIAGNOSIS — E785 Hyperlipidemia, unspecified: Secondary | ICD-10-CM

## 2022-06-13 MED ORDER — ROSUVASTATIN CALCIUM 5 MG PO TABS: 2.5000 mg | ORAL_TABLET | Freq: Every day | ORAL | 0 refills | Status: DC

## 2022-06-13 NOTE — Telephone Encounter (Signed)
Refills sent to pharmacy. 

## 2022-06-13 NOTE — Addendum Note (Signed)
Addended by: Pleas Koch on: 06/13/2022 02:12 PM   Modules accepted: Orders

## 2022-06-15 ENCOUNTER — Encounter: Payer: 59 | Admitting: Nurse Practitioner

## 2022-08-09 ENCOUNTER — Other Ambulatory Visit: Payer: Self-pay | Admitting: Family

## 2022-08-09 DIAGNOSIS — E1165 Type 2 diabetes mellitus with hyperglycemia: Secondary | ICD-10-CM

## 2022-08-09 NOTE — Telephone Encounter (Signed)
Called and scheduled patient f/u appt 08/15/22

## 2022-08-09 NOTE — Telephone Encounter (Signed)
Pt is due for f/u since starting mounjaro. Was supposed to make three month f/u after starting which ws in November.

## 2022-08-10 NOTE — Telephone Encounter (Signed)
Janett Billow, with Dr. Werner Lean at [place]    Requesting STAT records for cardiac clearance. Please refer to incoming fax for further information.     Preferred phone number:  [phone]  Last appointment: 08/01/2021  Next appointment: 08/14/2022 Virgel Bouquet, MD    [place], [place]

## 2022-08-14 ENCOUNTER — Ambulatory Visit: Admit: 2022-08-14 | Discharge: 2022-08-31 | Payer: MEDICARE | Attending: Physician | Primary: Physician

## 2022-08-14 DIAGNOSIS — I480 Paroxysmal atrial fibrillation: Secondary | ICD-10-CM

## 2022-08-14 MED ORDER — CONJUGATED ESTROGENS 0.625 MG/GRAM VAGINAL CREAM
0.625 | Freq: Every day | VAGINAL | 2.00 refills | 60.00000 days | Status: AC
Start: 2022-08-14 — End: ?

## 2022-08-14 NOTE — Patient Instructions (Addendum)
It was a pleasure to see you again in Electrophysiology!    I suggest you speak with Dr. Josie Saunders about the possibility of a stress test locally to help evaluate the recent left-sided chest discomfort.  I will send him a letter with recent pacemaker information.    Continue the low-dose metoprolol and let me know if you are have any more arrhythmia symptoms -- we will continue to follow your heart rhythm via regular pacemaker interrogations.    Let's plan to follow-up in 1 year, or sooner as needed.      ===========================================      MyChart Messages  For your safety and best care, please DO NOT use MyChart messages to report symptoms. (Symptoms should be reported by calling [phone]). Please use MyChart for non-urgent matters such as general questions, non-urgent prescription refills, or non-urgent scheduling.   -Please note that MyChart messages are routed to a central pool and one of your provider's team members will get back to you. Expect up to 3 business days for response.    Lab and test scheduling and results:  During your visit, your provider may order radiology exams or other tests. If you have not heard back within 1-2 weeks to be scheduled, please call:   For echocardiogram or stress-echo: [phone] [id number], Option 1.   For radiology (CT scan, MRI, PET scan, perfusion stress-test): [phone]   For other tests, call our office: [phone]  Test results may take up to 7- 10 business days and may be communicated to you in the following ways:   If you have signed up for MyChart, your results will be available on your MyChart account.   If you are scheduled for a follow up appointment after your tests are completed, your provider may wish to wait and discuss them during the follow up appointment.    We will not call you with the results of a normal test or device interrogation.     Procedure/Admission scheduling  Our EP procedure scheduling team will contact you to pick a date for your  procedure or admission. Please call [phone] to speak with the schedulers if you do not hear back in 5 business days. This is the direct scheduling line and should only be used for procedure scheduling.    Paging Operator:   If you have an urgent/emergent problem after 5pm, during the weekend, or on a holiday, please call [phone] and ask the operator to page the EP fellow on-call.    Temporary leave/disability forms directly related to EP procedures:  Please fill out all your demographic information and include anticipated work absence dates.  Please allow for a 2-week turnaround time for all paperwork submitted.   EP clinic fax: [phone]    Clinical Notes on My Chart:  Progress notes documented by your healthcare team will now be available on the MyChart portal. We believe that patients should be a part of the healthcare team. Please recognize that these notes are for documentation purposes and typos/minor errors may occur. If you identify any major discrepancies in the documentation, please let us know via MyChart or bring them to your next scheduled visit. With increased transparency, our hope is that we create more trust, better communication and increased satisfaction.    Research studies  At [facility], our primary goal is to treat your cardiac arrhythmia. We also strive to gather new information to help improve our understanding and treatment of all patient with cardiac arrhythmias. You may hear  from our research coordinators about several studies we are conducting. These may involve collecting special ECG recordings or blood samples, or data during an electrophysiology study or ablation procedure. All these protocols are discussed and approved by your electrophysiologist as well as the [employer] human research committee before they are offered. We appreciate your consideration of participation in these research protocols to allow you early access to new technology and to help future patients with cardiac  arrhythmias. Your electrophysiologist is happy to discuss any specific questions you may have about participation in these protocols, either over [facility] or via video visit.      Self Scheduling  You may receive a self-scheduling ticket via MyChart that allows you to schedule your next appointment online at the most convenient time for you. Please check MyChart after the visit.

## 2022-08-14 NOTE — Progress Notes (Unsigned)
I performed this evaluation using real-time telehealth tools, including a live video Zoom connection between my location and the patient's location. Prior to initiating, the patient consented to perform this evaluation using telehealth tools. My location for an independent visit or attending location for a shared visit is not in a [place] clinical facility.    Failed Video Connection:  Due to failed video connection, I obtained consent from the patient to conduct this visit by telephone only. I spent a total of *** minutes in audio communication with this patient.     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 Shaketia Liberti in Cardiac Electrophysiology today for further evaluation of symptomatic bradycardia, status post dual-chamber pacemaker, atrial fibrillation, and NSVT.    Ms. Gaza is a 67 y.o. 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    Allow me to review her history for the electronic MEDICAL RECORD NUMBER Exertional fatigue and mild shortness of breath started on 08/23/2015 and persisted thereafter, were also sometimes associated with lightheadedness.  She had previously been very active, feeding and riding her horse nearly every day.  Evaluation was notable for a baseline right bundle branch block, and periods of symptomatic 2:1 AV block while reaching peak exercise on a treadmill stress test (see details below).  Holter monitoring also reportedly showed blocked PAC's with resultant bradycardia, though to my review blocked atrial activity was not typically premature (see details below). 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    A dual-chamber device  was implanted without complication on 10/31/2015.  Thereafter, she noticed significant improvement in her symptoms, but had one episode very similar to her pre-pacemaker symptoms on 11/18/2015 and several milder episodes afterwards aborted by resting.  Device function was normal -- at follow-up 11/2015, she paced 5% in the RA and 1% in the RV.  She underwent breast augmentation surgery on 04/18/2016.    At her visit in April 2018, she reported having a "wide complex tachycardia" for 5 minutes (up to 115 bpm) after a colonoscopy 06/2016.  She also started having episodes of palpitations in February and specifically recalled symptomatic episodes on 2/12 and 2/24 -- the latter correlated with a >1 hour episode of atrial fibrillation recorded by the device.  She was started on aspirin 81 mg daily and recommended to take metoprolol 12.5 mg twice daily, though opted not to take it.    At her visit in 03/2017, she reported shortness of breath with exertion (such as walking up stairs) and wondered if there could be a functional issue with her heart -- she actually contacted me prior to the visit to request that an echocardiogram be scheduled the same day.  The echocardiogram showed normal LV function and no hemodynamically significant valvular disease.  Device interrogation showed 62 minutes of atrial fibrillation between April and October 2018, with the longest episode lasting 11:30 minutes on 01/01/2017.  Rate histograms showed a shift to the left, with atrial sensing or pacing in the 60s more than 50% of the time and only rare atrial sensing above 90 bpm.  We agreed to try adding rate-response to her device, and to continue  conservative management of the rare atrial fibrillation.  The rate-response was later disabled because she was feeling "jittery."  Device check in 06/2017 showed 0.1% atrial high rate detection (10 episodes), with the longest 53 minutes.  Device check in 10/2017 showed 0.1% atrial high rate detection, with  the longest episode 35 minutes.  There was also a 20 beat episode of NSVT recorded on 09/17/2017, correlating with an episode of lightheadedness (see my note dated 11/15/2017).  I recommended she try taking metoprolol 12.5 mg twice daily and consider cardiac MRI and/or PET (particularly given the paroxysmal AV block and atrial fibrillation).  Cardiac MRI in 12/2017 revealed mid-wall and subepicardial late gadolinium enhancement in the lateral wall at the level of the base.  A follow-up PET CT scan showed no abnormal FDG uptake and no resting perfusion defects.  She contacted me 03/19/2018 to report some chest pain "off and on" starting the day before and sent remote device transmissions.  At her visit in 04/2018, she was feeling well overall. She had occasional symptoms that corresponded to atrial fibrillation, though she did not feel it was impacting her life significantly. She denied any additional lightheadedness. She preferred to continue metoprolol for atrial fibrillation.   She had a follow-up echocardiogram in early May 2020 showing normal LV function (details below).  Device interrogations continued to show an overall low burden of atrial fibrillation.  At follow-up 11/18/2018, she was feeing "pretty good". She had some sensation of an irregular heart beat at night affecting sleep. We increased her metoprolol evening dose. Device interrogation showed rare brief NSVT.   On 04/14/2019, she presented to the ED at [facility] with episodes of low blood pressure of 80s systolic at home over the prior several days and some "off or blurry" vision. She reportedly had no focal neurologic deficits, and per records from her hospitalization, it was not felt symptoms were consistent with stroke. She was treated with IV fluids and discharged from ED.   Remote device interrogation 04/16/2019 showed normal function, with 106 episodes of atrial fibrillation since 02/10/2019 (burden 1.7%).  The longest formally measured episode  was 3 hours 40 min on 03/11/2019, though based on the log I suspect she was continuously in atrial fibrillation from about midnight until 9 am on 04/13/2019; average ventricular rates were in the 80s.  At follow-up 04/21/2019, she was  "hanging in there."  During the recent atrial fibrillation episode, she felt an irregular heart rate, but not a "super high" pulse.  She did not take her metoprolol, being concerned about lowering her blood pressure further. She continued to have some shortness of breath with exertion, particularly at altitude, forcing her to rest more than she felt she should have to.  We recalled that previously enabling the rate-response feature on her pacemaker in 03/2017 made her feel anxious and jittery.  We reviewed rhythm control options, but she preferred to continue low-dose metoprolol to try to prevent episodes.  Device interrogation 10/12/2019 showed 54 high rate atrial episodes, with burden 3% -- the longest was 08/01/2019 for 6 hours and 24 minutes.  Histograms show predominant V rates 60-140 bpm with minimal counts in bins to 180 bpm during AF.  She contacted me with questions about 10/10/2019 after 11pm: episode logs show atrial fibrillation from about 1 am to about 6 am on 10/11/2019.  At her visit 10/20/2019, she reported fatigue and low blood pressure that prompted her to decrease her metoprolol back to 12.5 mg twice daily.  She  got the first dose of Pfizer Covid-19 vaccine in 06/2019 and had atrial fibrillation that night -- she planned to defer the second shot for the time being. After discussion, she decided to proceed with ablation for the atrial fibrillation.  The ablation procedure was performed 12/21/2019.  All 4 PVs were isolated.  She was discharged the following day.    Remote transmission from her device 02/01/2020 recorded 2 episodes of non-sustained AF since the ablation, on 01/23/2020 for 9 seconds and on 01/26/2020 for 13 seconds -- however, EGMs appeared consistent with brief  oversensing.  At follow-up in 01/2020, she was feeling "OK" overall, with days when she felt very "tired" and "out of it."  She had some palpitations that didn't feel like atrial fibrillation. Device interrogation showed no arrhythmia episodes, and I recommended she continue metoprolol therapy.  She discontinued apixaban 03/22/2020 (3 months post-procedure)  She contacted me in 03/2020 with episodes of palpitations and brief episodes of sharp, "shooting chest pain."  Device interrogation showed no major arrhythmia episodes.  There was a total of 24 minutes of atrial fibrillation from 8/9 through 10/1, but none lasting more than a minute (so no recordings to review).  We decided to proceed with Zio monitoring.  The Zio and subsequent interrogations showed no atrial fibrillation.    She developed Covid 07/06/2020.  She subsequently reported sensations like she was not getting enough air (different than atrial fibrillation).  Device interrogation 07/19/2020 showed 4 episodes of AT/AF since 06/14/2020, the longest 30 seconds on 07/12/2020 at 3:23pm.  EGMs all showed atrial oversensing.  At follow-up 07/26/2020, she was feeling well and had recovered from Covid. She felt that the atrial fibrillation symptoms were better since the ablation in 11/2019, and there were no true atrial fibrillation events on pacemaker interrogation.  She did report episodes of "strong" heart beats she thought could be NSVT. She was planning to reduce her work hours and switch to [employer]. We agreed to continue monitoring arrhythmia burden via her pacemaker, and increase metoprolol if NSVT episodes became more frequent or bothersome.  At follow-up in 07/2021, he felt well overall -- "kind of funny" for about a month after device programming, but that improved.  She retired 08/11/2020 and was doing more outdoor activities, but not riding much anymore.  She noted only 1 or 2 episodes that she thought could be NSVT, but without symptoms nearly as strong as  the first time. She denied symptoms of recurrent atrial fibrillation.  We agreed to continue metoprolol and arrhythmia monitoring via her device. We deferred starting therapeutic anticoagulation.   Device monitoring over the next year showed no significant arrhythmia episodes.    Today, Ms. Arundel ***      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    History of motion sickness     IBS (irritable bowel syndrome) 10/18/2015    IBS (irritable bowel syndrome)     Mobitz type 2 second degree atrioventricular block 10/18/2015    s/p pacemaker     Pacemaker 11/29/2015    RBBB 10/18/2015    Thyroid nodule 10/18/2015    since 2003       CURRENT MEDICATIONS     Your Medications at the End of This Visit         Disp Refills Start End    diazePAM (VALIUM) 5 mg tablet -- -- 05/11/2021 --  Sig - Route: Take 5 mg by mouth daily as needed - Oral    Class: Historical Med    lansoprazole (PREVACID) 30 mg capsule 14 capsule 0 12/23/2019 --    Sig - Route: Take 1 capsule (30 mg total) by mouth daily Please take medication for two weeks then stop. - Oral    Renewals       Renewal requests to authorizing provider Thurston Hole Azalia Bilis Destruel, NP) <b>prohibited</b>            metoprolol tartrate (LOPRESSOR) 25 mg tablet -- --  --    Sig - Route: Take 12.5 mg by mouth Twice a day    - Oral    Class: Historical Med            ALLERGIES     Allergies as of 08/13/2022  Review status set to Review Complete by Burnetta Sabin on 07/31/2021        Severity Noted Reaction Type Reactions    Sulfamethoxazole-trimethoprim Not Specified 10/18/2015    Itching    Insomnia            SOCIAL HISTORY     She reports that she has never smoked. She has never used smokeless tobacco. She reports current alcohol use. She reports that she does not use drugs.    She is divorced.  She has 3 children.  She lives in [place], [place].  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    She has 2 horses (a Palomino and a Bay, ages 27 and 63).  She also wakeboards in the summer.    FAMILY HISTORY     Her father underwent CABG in his late 38's and has cardiomyopathy.  He was a heavy smoker. He passed away in early Jun 21, 2019.    Her mother had a heart attack around age 34.  She died after complications from a head injury in June 2016 after being knocked down by a horse.    Her grandfather had colon cancer.    REVIEW OF SYSTEMS     Gastrointestinal:  + IBS with cramping and gas.  No blood in the stool.  + polyps on colonoscopy in 06-20-14.    PHYSICAL EXAMINATION     Telephone only    DATA     I reviewed prior 12-lead EKGs:  - 12/21/2019: sinus rhythm with right bundle branch block at 75 bpm  - 04/02/2017: competing sinus rhythm and atrial pacing at 60 bpm with intact AV conduction and right bundle branch block (QRSd 124 msec).  - 10/02/2016: atrial pacing at 61 bpm with intact AV conduction and right bundle branch block  - 11/29/2015: normal sinus rhythm with right bundle branch block at 64 bpm.  - 10/18/2015: sinus bradycardia at 57 bpm with right bundle branch block  - 08/29/2015: sinus bradycardia at 57 bpm with right bundle branch block.    I reviewed prior pacemaker interrogations:  07/18/2022 (remote): Ap 34.8%, Vp 24.2%. Battery 4 years.  AT/AF <0.1% since 03/2022 -- 2 episodes, both less than 1 minute (0% from 12/2021-03/2022).  No EGMs:    06/15/2021 (remote): Vp 14.9%.  No arrhythmia episodes since 05/30/2021  05/30/2021 (remote): Several AT/AF episodes, with EGMs showing atrial oversensing and not true atrial fibrillation. Atrial sensitivity decreased (0.45 mV to 0.9 mV)  07/19/2020 (remote):  4 episodes of AT/AF since 06/14/2020, the longest 30 seconds on 07/12/2020 at 3:23pm.  There was one 8 beat episode of NSVT on 06/25/2020 at about 5am.  AT/AF EGMs all showed atrial  oversensing and not true atrial fibrillation:      02/01/2020 (remote): 2 episodes of non-sustained AF since the ablation, on 01/23/2020 for 9 seconds and on 01/26/2020 for 13 seconds, but EGMs appeared consistent with brief oversensing and not true atrial fibrillation.  10/12/2019 (remote): 54 high rate atrial episodes, with 46% A-pacing and 4% V-pacing.  AT/AF burden was 3% -- the longest episode was 08/01/2019 for 6 hours and 24 minutes (though other shorter episodes may have been part of one longer episode, including on 10/11/2019).  Histograms show predominant V rates 60-140 bpm with minimal counts in bins to 180 bpm during AF.    04/16/2019 (remote): normal overall function, with 48% atrial pacing and 2.9% ventricular pacing.  Total atrial fibrillation burden was 1.7% since 01/2019, with longest episode 3 hours 40 minutes on 03/11/2019.  There were multiple episodes 10/18-10/19/2020.  The longest episode within that time period measured formally was 1 h 20 min, though based on the log I suspect she was continuously in atrial fibrillation from ~midnight until ~9am on 04/13/2019.  11/18/2018 (see separate full report in [facility]):    - She paces 45.4% in the RA (stable) and 4.6% in the RV (stable).    - Atrial fibrillation burden has been 0.9%, the longest episode lasting 5 hours on 11/07/2018 with max V-rate of 130 bpm (max V-rate during all atrial fibrillation episodes 133 bpm)  - No NSVT recorded  - Many longer episodes were overnight    I reviewed the report and tracings from a [employer] monitor worn 6/8-6/23/2022, which showed:  * The observed rhythms are paced rhythm with intrinsic beat.  * The Maximum Heart Rate recorded was 150 bpm, Day 6 / 05:30:53 pm, the Minimum Heart Rate recorded was 59 bpm, Day 3 / 04:00:42 pm and the Average Heart Rate was 66 bpm.  * There were 132 PVCs with a burden of 0.01 %.  * There were 4 PSVCs with a burden of < 0.01 %. There was 1 occurrences of Supraventricular Tachycardia with the  longest episode 4 beats, Day 6 / 05:30:57 pm and the fastest episode 150 bpm, Day 6 / 05:30:57 pm.  * QTcB >460 ms 59.83%  * There were 0 Patient reported events.    I reviewed the report and tracings from a Zio patch worn 10/13-10/27/2021, which showed:  - Patient had a min HR of 60 bpm, max HR of 103 bpm, and avg HR of 66 bpm. Predominant underlying rhythm was Sinus Rhythm.   - Isolated SVEs were rare (<1.0%), SVE Couplets were rare (<1.0%), and SVE Triplets were rare (<1.0%).   - Isolated VEs were rare (<1.0%), VE Couplets were rare (<1.0%), and no VE Triplets were present.  - Final interpretation:  1. Predominant rhythm is sinus. Intermittent atrial and ventricular pacing noted.  2. Supraventricular and ventricular ectopic burden as above.  3. Single patient triggered event demonstrates sinus rhythm.  4. No identified atrial fibrillation.    I reviewed the report and tracings from a 24-hour Holter monitor 08/31/2015 [facility]):  - Average HR 64.  Minimum 38.  Maximum 97.  Predominant rhythm sinus.  - Single PVC's occasional; no runs.  - Frequent blocked PACs with resultant bradycardia.  No SVT or atrial fibrillation.    -  Occasional shortness of breath during periods of bradycardia with blocked PACs.  - On my review of tracings, several strips appear more consistent with sinus rhythm with 2:1 block than with blocked PAC's based on timing (though P-wave morphology is subtly different with blocked beats).  On tracings with both 2:1 and 1:1 conduction, the P-wave CL remains constant and ventricular rate exactly 1/2 during 2:1 block.  When transitioning from 1:1 to 2:1 block, the first blocked P-wave comes slightly early, but possibly due to increase in sinus rate.  - Less frequently (particularly 2:26am), bradycardia appears due to blocked PAC's in bigeminy.  - One period of 3:2 conduction (9:28am) without clear PR prolongation    I reviewed the reports of the following tests:  Echocardiogram 03/06/2021  [facility]):  - LV normal size and function; no LVH; EF 60-65%; normal diastolic function; normal wall motion  - RV normal function  - LA and RA sizes normal  - AV normal; no AS or AI; aortic root size normal  - Mitral valve sclerotic; no MS or MR  - Tricuspid valve normal; no RS or TR  - No significant pericardial effusion  - IVC normal size     Echocardiogram 10/31/2018 [facility]):      Cardiac PET 02/17/2018:  1.   Normal perfusion in the left ventricle without myocardial uptake of FDG. Findings are suggestive of no active myocardial inflammatory process or scar.  2.    Normal left ventricular wall motion with a calculated left ventricular ejection fraction of >65% on rest perfusion images.    Cardiac MRI 12/31/2017:  1. Mid wall and subepicardial late gadolinium enhancement in the lateral wall at the level of the base which can be seen with myocarditis or other inflammatory cardiomyopathy. Remaining myocardium demonstrates no evidence of scar.  2. Normal left ventricular function (ejection fraction 73%).     Echocardiogram 04/02/2017:  The patient's blood pressure was 154 mmHg/70 mmHg during the study. The heart rate during the study was 73. Color Flow Doppler was utilized for this exam. Spectral Doppler was utilized for this exam.  1. The left ventricular volume is normal. LV function is hyperdynamic. LV ejection fraction is estimated to be 70 to 75%.  2. The right ventricular volume is normal. Right ventricular function is normal.  3. Left atrial size is normal. Right atrial size is normal.  4. There is no hemodynamically significant valvular disease.  5. There is no Doppler evidence of LV diastolic dysfunction.  6. The RV systolic pressure is estimated to be 23 mmHg based on a right atrial pressure of 3 mmHg.  7. No pericardial effusion noted. The inferior vena cava is less than 21 mm in diameter and collapses with inspiration consistent with a right atrial pressure of 3 mmHg.  8. Aortic  root dimension is normal.  - Compared to the previous study on 09/23/15, the prior study is a stress echo.    Exercise stress echocardiogram 09/23/2015 [facility]):  - Resting EKG sinus rhythm with right bundle branch block   - Resting echocardiogram: Normal LV size, wall thickness, and function, EF 60-65%.  Atria normal size.  RV normal.  Mild anterior leaflet MVP and mild MR.  No other significant valvular pathology.  No pericardial effusion.  - Walked 8 min 11 sec on [place], with peak HR 118 bpm early but development of 2:1 AV block at peak exercise with HR in the 60s and associated shortness of breath (2:1  block versus atrial bigeminy with block).  Returned to 1:1 conduction in recovery.  Episodes of supraventricular bigeminy noted.  - I reviewed the tracings:  - At 2:24, there is a period of 3:2 conduction with constant P-P interval and no clear PR prolongation, that appears to persist through 5:50  - HR then drops from 89 to 74 but P-waves difficult to see  - At 6:50 there appears to be 2:1 block with occasional outflow tract PVC's  - At 7:50 there is suspicion for 2:1 conduction with blocked P-waves in prior T-waves.  - At 15 seconds into recovery there are periods with 1:1 conduction at double the rate of final exercise EKGs (5:4 and 6:5 conduction, but with little to no change in PR interval and R-R interval around blocked P-wave exactly double that of conducted intervals).  This apparent Mobitz type II second degree AV block becomes more apparent as the rate continues to slow in recovery.  - By 2 minutes into recovery there is clear 2:1 AV block that persists to 3 minutes, though with one episode of 3:2 block that appears slightly more consistent with Wenckebach based on the PR intervals and length of pause  - Stress echocardiogram:  Appropriate increase in contractility of all segments; no evidence of ischemia.    Chest CTA 09/22/2015 [facility]):  - Negative for PE or other explanation for shortness  of breath.  - Few liver lesions, probably cysts.    - Ascending and descending aorta normal caliber    PFTs 09/08/2015:  - FVC, FEV1, FEV1/FVC ratio, and FEF25-75% within normal limits  - TLC within normal limits  - FRC and RV increased  - With bronchodilators, there is significant response.  - Diffusing capacity high.  - Conclusions: Increased diffusing capacity consistent with left heart failure or shunting.  Although flow rates are within normal limits, the overinflation and response to bronchodilators are characteristic of reactive airways ("minimal")      The most recent laboratory evaluation at [facility] included the following:  Lab Results   Component Value Date/Time    NA 143 12/22/2019 05:10 AM    K 3.4 (L) 12/22/2019 05:10 AM    CL 109 12/22/2019 05:10 AM    CO2 28 12/22/2019 05:10 AM    BUN 8 12/22/2019 05:10 AM    GLU 99 12/22/2019 05:10 AM    CHOL 195 12/21/2019 10:18 AM    LDL 119 12/21/2019 10:18 AM    HDL 62 12/21/2019 10:18 AM    TG 69 12/21/2019 10:18 AM     Lab Results   Component Value Date    Creatinine 0.65 12/22/2019    Urea Nitrogen, Serum / Plasma 8 12/22/2019    Sodium, Serum / Plasma 143 12/22/2019    Potassium, Serum / Plasma 3.4 (L) 12/22/2019    Chloride, Serum / Plasma 109 12/22/2019    Carbon Dioxide, Total 28 12/22/2019    eGFR - high estimate 109 12/22/2019    eGFR - low estimate 94 12/22/2019     No results found for: "ALT", "AST", "ALKP", "DBILI", "TBILI", "ANA4", "TBILWB", "GGT", "GGTEXL", "GGTEXQ", "ANA5"   No results found for: "WBC", "WBCM", "WCBD", "HGB", "HGBM", "HCT", "HCTM", "NSH", "HCT22", "MCV", "PLT"  No results found for: "TSH"  No results found for: "INR", "PT", "LABPROT", "PROTIME"     ASSESSMENT AND RECOMMENDATIONS     In summary, Ms. Kulhanek is a 67 y.o. woman with exertional fatigue and shortness of breath, with evidence of Mobitz type II  second degree AV block and periods of 2:1 block on stress testing and Holter monitoring, status post dual-chamber pacemaker implant on  10/31/2015.  She was subsequently diagnosed with paroxysmal atrial fibrillation, as well as occasional NSVT, and underwent ablation for atrial fibrillation 12/21/2019.  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    We focused our evaluation and discussion today on the following problems:    1) Atrial fibrillation:  - She had a prior history of relatively infrequent palpitations and was diagnosed with paroxysmal atrial fibrillation via device interrogation.  The burden continued to slowly increase, and she was symptomatic with longer episodes.  She decided to proceed with catheter ablation, performed 12/21/2019 -- device interrogation has shown no confirmed episodes of true atrial fibrillation since, and her symptoms markedly improved.    We agreed on the following plan:  - We will continue to monitor arrhythmia burden via the implanted device  - She will continue metoprolol    2) NSVT:  - She had a symptomatic, 20-beat run of NSVT in 08/2017 associated with significant lightheadedness.  We initiated the metoprolol after that, with improvement in NSVT burden. Given the combination of AV block, sinus node dysfunction, paroxysmal atrial fibrillation, and NSVT, I suggested further evaluation for inflammatory cardiomyopathy such as sarcoid.  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  The etiology of the LGE is not clear -- perhaps she had a prior episode of myocarditis.    - The combination of the LGE and NSVT is obviously concerning, though she has normal LV function, no sustained ventricular arrhythmias, and no syncope.  Since starting low dose metoprolol she has had only infrequent and mild symptoms. I do not think there is an indication for ICD upgrade at this time.    - If NSVT episodes become more frequent or bothersome, it would be reasonable to try increasing the very low dose metoprolol further.    3) Stroke prevention:  - Her CHA2DS2-VASc score is 2.  - She stopped apixaban 3 months post-procedure, in part given her preference as someone who enjoys riding horses.  Since we are able to monitor for atrial fibrillation via the device, and her only risk factors will be age and gender, I think it will be safe to remain off therapeutic anticoagulation for the time being.  We will have a low threshold to resume if she has recurrent atrial fibrillation (particularly if she is no longer riding).    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  Her ventricular pacing burden has remained low overall, and LV function has remained normal.  - She will continue routine pacemaker checks remotely and in the clinic.    5) Possible sinus node dysfunction:  - She may have some degree of chronotropic incompetence that contributes to exertional dyspnea, but felt poorly with rate-response enabled.  We could consider another trial of rate-response with less aggressive settings in the future.    It is a pleasure participating in Ms. Dillavou's care. I spent a total of *** minutes on her care on the day of her visit excluding time spent related to any billed procedures (for a total shared visit time of *** minutes). This  time includes face-to-face time with the patient as well as time spent documenting in the medical record, reviewing the patient's records and tests, obtaining history, placing orders, communicating with other healthcare professionals, counseling the patient, family, or caregiver, and/or care coordination for the diagnoses above. Please do not hesitate to contact me with any questions or concerns.    It is a pleasure participating in Ms. Linehan's care. Please do not  hesitate to contact me with any questions or concerns.    Alric Seton. Rachelle Hora, MD, [facility], [facility]  Professor of Clinical Medicine  Division of Cardiology  Cardiac Electrophysiology

## 2022-08-14 NOTE — Telephone Encounter (Signed)
Faxed records to Dr Allayne Stack office.

## 2022-08-15 ENCOUNTER — Ambulatory Visit (INDEPENDENT_AMBULATORY_CARE_PROVIDER_SITE_OTHER): Payer: Medicare Other | Admitting: Family

## 2022-08-15 ENCOUNTER — Encounter: Payer: Self-pay | Admitting: Family

## 2022-08-15 VITALS — BP 136/82 | HR 88 | Temp 97.8°F | Ht 63.0 in | Wt 156.0 lb

## 2022-08-15 DIAGNOSIS — E1169 Type 2 diabetes mellitus with other specified complication: Secondary | ICD-10-CM | POA: Diagnosis not present

## 2022-08-15 DIAGNOSIS — E1142 Type 2 diabetes mellitus with diabetic polyneuropathy: Secondary | ICD-10-CM | POA: Diagnosis not present

## 2022-08-15 DIAGNOSIS — E119 Type 2 diabetes mellitus without complications: Secondary | ICD-10-CM

## 2022-08-15 DIAGNOSIS — I10 Essential (primary) hypertension: Secondary | ICD-10-CM

## 2022-08-15 DIAGNOSIS — E785 Hyperlipidemia, unspecified: Secondary | ICD-10-CM

## 2022-08-15 DIAGNOSIS — B351 Tinea unguium: Secondary | ICD-10-CM

## 2022-08-15 LAB — POCT GLYCOSYLATED HEMOGLOBIN (HGB A1C): Hemoglobin A1C: 6.9 % — AB (ref 4.0–5.6)

## 2022-08-15 MED ORDER — CICLOPIROX 8 % EX SOLN: Freq: Every day | CUTANEOUS | 0 refills | Status: AC

## 2022-08-15 NOTE — Patient Instructions (Signed)
  A referral was placed today for ophthalmology exam for your diabetes.  Please let us know if you have not heard back within 2 weeks about the referral.   Regards,   Leonard Hendler FNP-C

## 2022-08-15 NOTE — Assessment & Plan Note (Addendum)
Hga1c today much improved Continue mounjaro 2.5 mg weekly  Referral placed for ophthalmologist for yearly eye screening for diabetic retinopathy  Foot exam completed in office today  Pt to check feet daily, instructed pt  Urine microalbumin ordered for today

## 2022-08-15 NOTE — Assessment & Plan Note (Signed)
Stable continue rosuvastatin

## 2022-08-15 NOTE — Progress Notes (Signed)
Established Patient Office Visit  Subjective:      CC:  Chief Complaint  Patient presents with   Medical Management of Chronic Issues    HPI: Sarah Brock is a 67 y.o. female presenting on 08/15/2022 for Medical Management of Chronic Issues . Dm2: fasting glucose levels , lowest 77 highest 117 Since starting mounjaro has lost 8 pounds  Overude for annual eye exam.  Denies side effects with mounjaro, denies nausea/vomiting and or constipation.  Lab Results  Component Value Date   HGBA1C 6.9 (A) 08/15/2022   Wt Readings from Last 3 Encounters:  08/15/22 156 lb (70.8 kg)  05/07/22 164 lb 2 oz (74.4 kg)  08/04/21 170 lb 12.8 oz (77.5 kg)   HTN: stable doing well with amlodipine and lisinopril   Crestor: rosuvastatin 5 mg , taking 1/2 table tolerating well. Denies myalgias.   Diabetic Foot Form - Detailed   Diabetic Foot Exam - detailed Can the patient see the bottom of their feet?: Yes Are the shoes appropriate in style and fit?: Yes Is there swelling or and abnormal foot shape?: No Is there a claw toe deformity?: No Is there elevated skin temparature?: No Is there foot or ankle muscle weakness?: No Normal Range of Motion: Yes Pulse Foot Exam completed.: Yes   Right posterior Tibialias: Present Left posterior Tibialias: Present   Right Dorsalis Pedis: Present Left Dorsalis Pedis: Present  Semmes-Weinstein Monofilament Test R Site 1-Great Toe: Pos L Site 1-Great Toe: Pos             Social history:  Relevant past medical, surgical, family and social history reviewed and updated as indicated. Interim medical history since our last visit reviewed.  Allergies and medications reviewed and updated.  DATA REVIEWED: CHART IN EPIC     ROS: Negative unless specifically indicated above in HPI.    Current Outpatient Medications:    Accu-Chek FastClix Lancets MISC, Use to test blood sugar twice daily. Dx: E11.9, Disp: 200 each, Rfl: 3   amLODipine  (NORVASC) 10 MG tablet, TAKE 1 TABLET EVERY DAY, Disp: 90 tablet, Rfl: 1   BIOTIN PO, Take 1 tablet by mouth daily., Disp: , Rfl:    Blood Glucose Monitoring Suppl (ACCU-CHEK GUIDE) w/Device KIT, Use to test blood sugar twice daily. Dx: E11.9, Disp: 1 kit, Rfl: 0   cetirizine (ZYRTEC) 10 MG tablet, Take 10 mg by mouth at bedtime., Disp: , Rfl:    ciclopirox (PENLAC) 8 % solution, Apply topically at bedtime. Apply over nail and surrounding skin. Apply daily over previous coat. After seven (7) days, may remove with alcohol and continue cycle., Disp: 6.6 mL, Rfl: 0   COLLAGEN PO, Take by mouth daily. Powder formula, adds to coffee, Disp: , Rfl:    ezetimibe (ZETIA) 10 MG tablet, TAKE 1 TABLET EVERY DAY, Disp: 90 tablet, Rfl: 1   ferrous sulfate 325 (65 FE) MG tablet, Take 1 tablet (325 mg total) by mouth 2 (two) times daily with a meal., Disp: 60 tablet, Rfl: 2   glucose blood (ACCU-CHEK GUIDE) test strip, Use to test blood sugar twice daily. Dx: E11.9, Disp: 200 each, Rfl: 3   Lancets Misc. (ACCU-CHEK FASTCLIX LANCET) KIT, by Does not apply route., Disp: , Rfl:    levETIRAcetam (KEPPRA) 500 MG tablet, TAKE 1 TABLET BY MOUTH EVERY 12 (TWELVE) HOURS FOR SEIZURES, Disp: 180 tablet, Rfl: 1   lisinopril (ZESTRIL) 30 MG tablet, TAKE 1 TABLET EVERY DAY FOR BLOOD PRESSURE, Disp: 90 tablet, Rfl:  1   metFORMIN (GLUCOPHAGE) 1000 MG tablet, TAKE 1 TABLET TWICE DAILY WITH A MEAL TO CONTROL BLOOD SUGAR, Disp: 180 tablet, Rfl: 1   rosuvastatin (CRESTOR) 5 MG tablet, Take 0.5 tablets (2.5 mg total) by mouth at bedtime. for cholesterol., Disp: 45 tablet, Rfl: 0   tirzepatide (MOUNJARO) 5 MG/0.5ML Pen, Inject 5 mg into the skin once a week., Disp: 2 mL, Rfl: 0      Objective:    BP 136/82   Pulse 88   Temp 97.8 F (36.6 C) (Temporal)   Ht 5' 3"$  (1.6 m)   Wt 156 lb (70.8 kg)   LMP  (LMP Unknown)   SpO2 99%   BMI 27.63 kg/m   Wt Readings from Last 3 Encounters:  08/15/22 156 lb (70.8 kg)  05/07/22 164 lb 2  oz (74.4 kg)  08/04/21 170 lb 12.8 oz (77.5 kg)    Physical Exam Vitals reviewed.  Constitutional:      General: She is not in acute distress.    Appearance: Normal appearance. She is normal weight. She is not ill-appearing, toxic-appearing or diaphoretic.  HENT:     Head: Normocephalic.  Cardiovascular:     Rate and Rhythm: Normal rate and regular rhythm.     Pulses:          Dorsalis pedis pulses are 2+ on the right side and 2+ on the left side.       Posterior tibial pulses are 2+ on the right side and 2+ on the left side.  Pulmonary:     Effort: Pulmonary effort is normal.  Musculoskeletal:        General: Normal range of motion.     Right lower leg: No edema.     Left lower leg: No edema.     Right foot: Normal range of motion. No deformity.     Left foot: Normal range of motion. No deformity.  Feet:     Right foot:     Protective Sensation: 10 sites tested.  10 sites sensed.     Skin integrity: Skin integrity normal.     Toenail Condition: Right toenails are normal.     Left foot:     Protective Sensation: 10 sites tested.  10 sites sensed.     Skin integrity: Skin integrity normal.     Toenail Condition: Fungal disease present. Neurological:     General: No focal deficit present.     Mental Status: She is alert and oriented to person, place, and time. Mental status is at baseline.  Psychiatric:        Mood and Affect: Mood normal.        Behavior: Behavior normal.        Thought Content: Thought content normal.        Judgment: Judgment normal.            Assessment & Plan:  Type 2 diabetes mellitus with diabetic polyneuropathy, without long-term current use of insulin (HCC) -     Microalbumin / creatinine urine ratio -     POCT glycosylated hemoglobin (Hb A1C) -     Ambulatory referral to Ophthalmology  Hyperlipidemia associated with type 2 diabetes mellitus ([place]) Assessment & Plan: Stable continue rosuvastatin    Essential hypertension  Toenail  fungus -     Ciclopirox; Apply topically at bedtime. Apply over nail and surrounding skin. Apply daily over previous coat. After seven (7) days, may remove with alcohol and continue cycle.  Dispense:  6.6 mL; Refill: 0  Controlled type 2 diabetes mellitus without complication, without long-term current use of insulin ([place]) Assessment & Plan: Hga1c today much improved Continue mounjaro 2.5 mg weekly  Referral placed for ophthalmologist for yearly eye screening for diabetic retinopathy  Foot exam completed in office today  Pt to check feet daily, instructed pt  Urine microalbumin ordered for today       Return in about 6 months (around 02/13/2023) for f/u diabetes.  Eugenia Pancoast, MSN, APRN, FNP-C [place]

## 2022-08-16 LAB — MICROALBUMIN / CREATININE URINE RATIO
Creatinine,U: 524.5 mg/dL
Microalb Creat Ratio: 1.6 mg/g (ref 0.0–30.0)
Microalb, Ur: 8.3 mg/dL — ABNORMAL HIGH (ref 0.0–1.9)

## 2022-11-05 ENCOUNTER — Ambulatory Visit: Payer: Medicare Other | Admitting: Family

## 2022-11-12 ENCOUNTER — Other Ambulatory Visit: Payer: Self-pay | Admitting: Nurse Practitioner

## 2022-11-12 DIAGNOSIS — E1169 Type 2 diabetes mellitus with other specified complication: Secondary | ICD-10-CM

## 2022-11-12 DIAGNOSIS — I10 Essential (primary) hypertension: Secondary | ICD-10-CM

## 2022-12-04 ENCOUNTER — Other Ambulatory Visit: Payer: Self-pay | Admitting: Family

## 2022-12-04 ENCOUNTER — Other Ambulatory Visit: Payer: Self-pay | Admitting: Primary Care

## 2022-12-04 DIAGNOSIS — E785 Hyperlipidemia, unspecified: Secondary | ICD-10-CM

## 2022-12-04 DIAGNOSIS — E1165 Type 2 diabetes mellitus with hyperglycemia: Secondary | ICD-10-CM

## 2022-12-04 NOTE — Telephone Encounter (Signed)
Is pt still taking mounjaro I do not see it on her medication list anymore?

## 2022-12-04 NOTE — Telephone Encounter (Signed)
Spoke with the patient confirmed that she is still taking the [facility] 5mg . She has another week left on her current pen.

## 2022-12-05 ENCOUNTER — Other Ambulatory Visit: Payer: Self-pay

## 2022-12-05 DIAGNOSIS — E1169 Type 2 diabetes mellitus with other specified complication: Secondary | ICD-10-CM

## 2022-12-05 NOTE — Telephone Encounter (Signed)
Received a fax refill request from [employer].

## 2022-12-06 MED ORDER — EZETIMIBE 10 MG PO TABS
10.0000 mg | ORAL_TABLET | Freq: Every day | ORAL | 1 refills | Status: DC
Start: 2022-12-06 — End: 2023-11-10

## 2023-01-09 ENCOUNTER — Ambulatory Visit: Payer: Medicare Other | Admitting: Family

## 2023-01-29 ENCOUNTER — Other Ambulatory Visit: Payer: Self-pay | Admitting: Family

## 2023-01-29 DIAGNOSIS — I10 Essential (primary) hypertension: Secondary | ICD-10-CM

## 2023-01-29 MED ORDER — LEVETIRACETAM 500 MG PO TABS: 500.0000 mg | ORAL_TABLET | Freq: Two times a day (BID) | ORAL | 3 refills | Status: DC

## 2023-01-29 MED ORDER — METFORMIN HCL 1000 MG PO TABS: 1000.0000 mg | ORAL_TABLET | Freq: Two times a day (BID) | ORAL | 3 refills | Status: DC

## 2023-01-29 MED ORDER — AMLODIPINE BESYLATE 10 MG PO TABS: 10.0000 mg | ORAL_TABLET | Freq: Every day | ORAL | 3 refills | Status: DC

## 2023-01-29 MED ORDER — LISINOPRIL 30 MG PO TABS: 30.0000 mg | ORAL_TABLET | Freq: Every day | ORAL | 3 refills | Status: DC

## 2023-01-29 NOTE — Telephone Encounter (Signed)
Prescription Request  01/29/2023  LOV: 08/15/2022  What is the name of the medication or equipment? amLODipine (NORVASC) 10 MG tablet, metFORMIN (GLUCOPHAGE) 1000 MG tablet, levETIRAcetam (KEPPRA) 500 MG tablet & lisinopril (ZESTRIL) 30 MG tablet   Have you contacted your pharmacy to request a refill? Yes   Which pharmacy would you like this sent to?  [facility] - [place], [place] - [address] [place] [address] Phone: [phone] Fax: [phone]     Patient notified that their request is being sent to the clinical staff for review and that they should receive a response within 2 business days.   Please advise at Mobile [phone] (mobile)

## 2023-01-29 NOTE — Addendum Note (Signed)
Addended by: Shon Millet on: 01/29/2023 10:26 AM   Modules accepted: Orders

## 2023-02-07 ENCOUNTER — Encounter (INDEPENDENT_AMBULATORY_CARE_PROVIDER_SITE_OTHER): Payer: Self-pay

## 2023-02-26 ENCOUNTER — Other Ambulatory Visit (HOSPITAL_COMMUNITY): Payer: Self-pay

## 2023-03-14 ENCOUNTER — Encounter: Payer: Self-pay | Admitting: Family

## 2023-03-14 ENCOUNTER — Ambulatory Visit (INDEPENDENT_AMBULATORY_CARE_PROVIDER_SITE_OTHER): Payer: Medicare Other | Admitting: Family

## 2023-03-14 ENCOUNTER — Encounter: Payer: Self-pay | Admitting: *Deleted

## 2023-03-14 VITALS — BP 120/70 | HR 93 | Temp 97.5°F | Ht 63.0 in | Wt 158.6 lb

## 2023-03-14 DIAGNOSIS — Z7984 Long term (current) use of oral hypoglycemic drugs: Secondary | ICD-10-CM | POA: Diagnosis not present

## 2023-03-14 DIAGNOSIS — E1142 Type 2 diabetes mellitus with diabetic polyneuropathy: Secondary | ICD-10-CM

## 2023-03-14 DIAGNOSIS — E785 Hyperlipidemia, unspecified: Secondary | ICD-10-CM | POA: Diagnosis not present

## 2023-03-14 DIAGNOSIS — Z862 Personal history of diseases of the blood and blood-forming organs and certain disorders involving the immune mechanism: Secondary | ICD-10-CM

## 2023-03-14 DIAGNOSIS — Z780 Asymptomatic menopausal state: Secondary | ICD-10-CM

## 2023-03-14 DIAGNOSIS — I10 Essential (primary) hypertension: Secondary | ICD-10-CM | POA: Diagnosis not present

## 2023-03-14 DIAGNOSIS — Z7985 Long-term (current) use of injectable non-insulin antidiabetic drugs: Secondary | ICD-10-CM

## 2023-03-14 DIAGNOSIS — E119 Type 2 diabetes mellitus without complications: Secondary | ICD-10-CM

## 2023-03-14 DIAGNOSIS — E1169 Type 2 diabetes mellitus with other specified complication: Secondary | ICD-10-CM

## 2023-03-14 DIAGNOSIS — Z1231 Encounter for screening mammogram for malignant neoplasm of breast: Secondary | ICD-10-CM

## 2023-03-14 DIAGNOSIS — Z23 Encounter for immunization: Secondary | ICD-10-CM | POA: Diagnosis not present

## 2023-03-14 LAB — CBC
HCT: 37.7 % (ref 36.0–46.0)
Hemoglobin: 12.1 g/dL (ref 12.0–15.0)
MCHC: 32 g/dL (ref 30.0–36.0)
MCV: 81.3 fl (ref 78.0–100.0)
Platelets: 313 10*3/uL (ref 150.0–400.0)
RBC: 4.64 Mil/uL (ref 3.87–5.11)
RDW: 14.5 % (ref 11.5–15.5)
WBC: 6.2 10*3/uL (ref 4.0–10.5)

## 2023-03-14 LAB — BASIC METABOLIC PANEL
BUN: 14 mg/dL (ref 6–23)
CO2: 27 mEq/L (ref 19–32)
Calcium: 9.4 mg/dL (ref 8.4–10.5)
Chloride: 106 mEq/L (ref 96–112)
Creatinine, Ser: 0.9 mg/dL (ref 0.40–1.20)
GFR: 66.15 mL/min (ref >60.00)
Glucose, Bld: 94 mg/dL (ref 70–99)
Potassium: 4.1 mEq/L (ref 3.5–5.1)
Sodium: 141 mEq/L (ref 135–145)

## 2023-03-14 LAB — LIPID PANEL
Cholesterol: 156 mg/dL (ref 0–200)
HDL: 60.6 mg/dL (ref >39.00)
LDL Cholesterol: 71 mg/dL (ref 0–99)
NonHDL: 95.81
Total CHOL/HDL Ratio: 3
Triglycerides: 122 mg/dL (ref 0.0–149.0)
VLDL: 24.4 mg/dL (ref 0.0–40.0)

## 2023-03-14 LAB — HEMOGLOBIN A1C: Hgb A1c MFr Bld: 6.2 % (ref 4.6–6.5)

## 2023-03-14 NOTE — Assessment & Plan Note (Signed)
Cbc ordered pending results.

## 2023-03-14 NOTE — Progress Notes (Signed)
Subjective:  Patient ID: Sarah Brock, female    DOB: 10/29/1955  Age: 67 y.o. MRN: [id number]  Patient Care Team: Mort Sawyers, FNP as PCP - General (Family Medicine)   CC:  Chief Complaint  Patient presents with   Annual Exam    HPI Sarah Brock is a 67 y.o. female who presents today for an annual physical exam, follow up. She reports consuming a general diet. The patient does not participate in regular exercise at present. She generally feels well. She reports sleeping well. She does have additional problems to discuss today.   Vision:Not within last year Dental:Receives regular dental care  Mammogram:  ordered 05/07/22 pt has not yet scheduled.  Last pap:  Colonoscopy: 05/28/2019 Q5y Bone density scan: overdue ordered 05/07/22 pt needs to schedule.  Flu vaccination: will get today  Pneumonia vaccination: will get today   Pt is with acute concerns.   DM2: having issues with mounjaro because has been expensive. Called her insurance and advised her of a number to call to help with the medication.  Referral was placed for ophthalmologist 2/24 but pt never made appointment.   Advanced Directives Patient does not have advanced directives    DEPRESSION SCREENING    03/14/2023    8:24 AM 08/15/2022   10:28 AM 06/12/2021    3:43 PM 11/04/2019    3:35 PM 12/31/2018    3:45 PM 10/08/2018   11:39 AM 06/20/2018    3:50 PM  PHQ 2/9 Scores  PHQ - 2 Score 0 0 0 0 0 0 0  PHQ- 9 Score 0 1          ROS: Negative unless specifically indicated above in HPI.    Current Outpatient Medications:    Accu-Chek FastClix Lancets MISC, Use to test blood sugar twice daily. Dx: E11.9, Disp: 200 each, Rfl: 3   amLODipine (NORVASC) 10 MG tablet, Take 1 tablet (10 mg total) by mouth daily., Disp: 90 tablet, Rfl: 3   BIOTIN PO, Take 1 tablet by mouth daily., Disp: , Rfl:    Blood Glucose Monitoring Suppl (ACCU-CHEK GUIDE) w/Device KIT, Use to test blood sugar twice daily. Dx: E11.9,  Disp: 1 kit, Rfl: 0   cetirizine (ZYRTEC) 10 MG tablet, Take 10 mg by mouth at bedtime., Disp: , Rfl:    ciclopirox (PENLAC) 8 % solution, Apply topically at bedtime. Apply over nail and surrounding skin. Apply daily over previous coat. After seven (7) days, may remove with alcohol and continue cycle., Disp: 6.6 mL, Rfl: 0   COLLAGEN PO, Take by mouth daily. Powder formula, adds to coffee, Disp: , Rfl:    ezetimibe (ZETIA) 10 MG tablet, Take 1 tablet (10 mg total) by mouth daily., Disp: 90 tablet, Rfl: 1   ferrous sulfate 325 (65 FE) MG tablet, Take 1 tablet (325 mg total) by mouth 2 (two) times daily with a meal., Disp: 60 tablet, Rfl: 2   glucose blood (ACCU-CHEK GUIDE) test strip, Use to test blood sugar twice daily. Dx: E11.9, Disp: 200 each, Rfl: 3   Lancets Misc. (ACCU-CHEK FASTCLIX LANCET) KIT, by Does not apply route., Disp: , Rfl:    levETIRAcetam (KEPPRA) 500 MG tablet, Take 1 tablet (500 mg total) by mouth every 12 (twelve) hours., Disp: 180 tablet, Rfl: 3   lisinopril (ZESTRIL) 30 MG tablet, Take 1 tablet (30 mg total) by mouth daily., Disp: 90 tablet, Rfl: 3   metFORMIN (GLUCOPHAGE) 1000 MG tablet, Take 1 tablet (1,000 mg total)  by mouth 2 (two) times daily with a meal., Disp: 180 tablet, Rfl: 3   MOUNJARO 5 MG/0.5ML Pen, Inject into the skin., Disp: , Rfl:    rosuvastatin (CRESTOR) 5 MG tablet, TAKE 1/2 TABLET AT BEDTIME FOR CHOLESTEROL, Disp: 45 tablet, Rfl: 3   tirzepatide (MOUNJARO) 5 MG/0.5ML Pen, INJECT 5MG  (1 PEN) UNDER THE SKIN ONCE A WEEK, Disp: 6 mL, Rfl: 1    Objective:    BP 120/70 (BP Location: Left Arm, Patient Position: Sitting, Cuff Size: Normal)   Pulse 93   Temp (!) 97.5 F (36.4 C) (Temporal)   Ht 5\' 3"  (1.6 m)   Wt 158 lb 9.6 oz (71.9 kg)   LMP  (LMP Unknown)   SpO2 98%   BMI 28.09 kg/m   BP Readings from Last 3 Encounters:  03/14/23 120/70  08/15/22 136/82  05/07/22 128/70      Physical Exam Constitutional:      General: She is not in acute  distress.    Appearance: Normal appearance. She is normal weight. She is not ill-appearing.  HENT:     Head: Normocephalic.     Right Ear: Tympanic membrane normal.     Left Ear: Tympanic membrane normal.     Nose: Nose normal.     Mouth/Throat:     Mouth: Mucous membranes are moist.  Eyes:     Extraocular Movements: Extraocular movements intact.     Pupils: Pupils are equal, round, and reactive to light.  Cardiovascular:     Rate and Rhythm: Normal rate and regular rhythm.  Pulmonary:     Effort: Pulmonary effort is normal.     Breath sounds: Normal breath sounds.  Abdominal:     Tenderness: There is no guarding or rebound.  Musculoskeletal:        General: Normal range of motion.     Cervical back: Normal range of motion.  Skin:    General: Skin is warm.     Capillary Refill: Capillary refill takes less than 2 seconds.  Neurological:     General: No focal deficit present.     Mental Status: She is alert.  Psychiatric:        Mood and Affect: Mood normal.        Behavior: Behavior normal.        Thought Content: Thought content normal.        Judgment: Judgment normal.          Assessment & Plan:  Screening mammogram for breast cancer -     3D Screening Mammogram, Left and Right; Future  Postmenopausal -     DG Bone Density; Future  Controlled type 2 diabetes mellitus without complication, without long-term current use of insulin (HCC) -     Ambulatory referral to Ophthalmology -     Hemoglobin A1c -     Basic metabolic panel  History of anemia Assessment & Plan: Cbc ordered pending results.   Orders: -     CBC  Hyperlipidemia associated with type 2 diabetes mellitus (HCC) Assessment & Plan: Ordered lipid panel, pending results. Work on low cholesterol diet and exercise as tolerated Continue rosuvastatin 5 mg once daily as well as zetia 10 mg  Orders: -     Lipid panel  Essential hypertension Assessment & Plan: Continue lisinopril 30 mg once daily  and amlodipine 10 mg once daily     Type 2 diabetes mellitus with diabetic polyneuropathy, without long-term current use of insulin (HCC) Assessment &  Plan: Foot exam in office today  Overdue for eye exam.  Conitnue mounjaro and metformin 1000 mg twice daily        Follow-up: No follow-ups on file.   Mort Sawyers, FNP

## 2023-03-14 NOTE — Assessment & Plan Note (Signed)
Ordered lipid panel, pending results. Work on low cholesterol diet and exercise as tolerated Continue rosuvastatin 5 mg once daily as well as zetia 10 mg

## 2023-03-14 NOTE — Assessment & Plan Note (Signed)
Continue lisinopril 30 mg once daily and amlodipine 10 mg once daily

## 2023-03-14 NOTE — Patient Instructions (Signed)
  I have sent an electronic order over to your preferred location for the following:   []   2D Mammogram  [x]   3D Mammogram  [x]   Bone Density   Please give this center a call to get scheduled at your convenience.   [x]   [facility]      [address] [place], [place]        [phone]         Make sure to wear two piece  clothing  No lotions powders or deodorants the day of the appointment Make sure to bring picture ID and insurance card.  Bring list of medications you are currently taking including any supplements.    ------------------------------------

## 2023-03-14 NOTE — Assessment & Plan Note (Signed)
Foot exam in office today  Overdue for eye exam.  Conitnue mounjaro and metformin 1000 mg twice daily

## 2023-03-14 NOTE — Addendum Note (Signed)
Addended by: Jaynee Eagles C on: 03/14/2023 09:07 AM   Modules accepted: Orders

## 2023-03-19 ENCOUNTER — Other Ambulatory Visit: Payer: Self-pay | Admitting: Family

## 2023-03-19 ENCOUNTER — Telehealth: Payer: Self-pay

## 2023-03-19 ENCOUNTER — Other Ambulatory Visit (HOSPITAL_COMMUNITY): Payer: Self-pay

## 2023-03-19 DIAGNOSIS — E1169 Type 2 diabetes mellitus with other specified complication: Secondary | ICD-10-CM

## 2023-03-19 DIAGNOSIS — I10 Essential (primary) hypertension: Secondary | ICD-10-CM

## 2023-03-19 DIAGNOSIS — E1142 Type 2 diabetes mellitus with diabetic polyneuropathy: Secondary | ICD-10-CM

## 2023-03-19 MED ORDER — OZEMPIC (0.25 OR 0.5 MG/DOSE) 2 MG/3ML ~~LOC~~ SOPN
PEN_INJECTOR | SUBCUTANEOUS | 2 refills | Status: DC
Start: 2023-03-19 — End: 2024-04-20

## 2023-03-19 NOTE — Telephone Encounter (Signed)
Received denial for tier exception for Mounjaro 5mg /ml, due to coverage gap, pt is expected to pay $227.19. If this is unaffordable for pt, please consider switching to Ozempic, as [employer] has a pt assistance program, that would provide 4 month supplies free of charge to pt if pt qualifies.

## 2023-03-19 NOTE — Telephone Encounter (Signed)
PAP: Application for Ozempic has been submitted to PAP Companies: [employer], online, currently pending

## 2023-03-19 NOTE — Progress Notes (Unsigned)
ozempi

## 2023-03-19 NOTE — Telephone Encounter (Signed)
Thank you for the recommendation.   I sent in the RX for ozempic. Do you do anything further with this or do I? Do I need to send to prior auth team?

## 2023-03-21 NOTE — Telephone Encounter (Signed)
PAP: Patient assistance application for Ozempic has been approved by PAP Companies: [employer] from 03/21/2023 to 06/25/2023.   Medication should be delivered to Provider's office   For further shipping updates, please contact [employer] at [phone] Pt ID is: [id number]

## 2023-04-03 ENCOUNTER — Telehealth: Payer: Self-pay | Admitting: Family

## 2023-04-03 NOTE — Telephone Encounter (Signed)
Received pt's Ozempic today in the mail. #4 boxes were received. These have been placed in the fridge. ATC pt but received a message that my call could not be completed at this time and to try again later. Will try back.

## 2023-04-04 ENCOUNTER — Encounter: Payer: Self-pay | Admitting: *Deleted

## 2023-04-04 NOTE — Telephone Encounter (Signed)
ATC pt but received a message that my call could not be completed at this time and to try again later.  I sent pt a MyChart message letting her know that we received her medication.

## 2023-05-16 ENCOUNTER — Telehealth: Payer: Self-pay

## 2023-05-16 ENCOUNTER — Other Ambulatory Visit (HOSPITAL_COMMUNITY): Payer: Self-pay

## 2023-05-16 NOTE — Telephone Encounter (Signed)
PAP: Application for Ozempic has been submitted to PAP Companies: [employer], online  Patient pages submitted. Faxing provider portion to office.

## 2023-05-16 NOTE — Telephone Encounter (Signed)
Patient responded and said to complete application on her behalf.

## 2023-05-22 NOTE — Progress Notes (Deleted)
Pharmacy Medication Assistance Program Note    05/22/2023  Patient ID: Sarah Brock, female   DOB: 09/28/1955, 67 y.o.   MRN: [id number]     05/16/2023  Outreach [employer]  Manufacturer [employer] [facility] Ozempic  Type of Radiographer, therapeutic Assistance  Date Application Sent to Prescriber 05/16/2023  Name of Prescriber [employer]       Signature

## 2023-05-28 ENCOUNTER — Telehealth: Payer: Self-pay | Admitting: Family

## 2023-05-28 NOTE — Telephone Encounter (Signed)
The forms dropped off by the pt have the pt's information in the sections that should have Tabitha's information. I have located blank forms and they have been filled out and placed in Tabitha's in box.

## 2023-05-28 NOTE — Telephone Encounter (Signed)
Patient dropped off document Patient Assistance Application, to be filled out by provider. Patient requested to send it back via Call Patient to pick up within 5-days. Document is located in providers tray at front office.Please advise at [facility] [phone]

## 2023-05-31 NOTE — Telephone Encounter (Signed)
Completed in outbox.

## 2023-05-31 NOTE — Telephone Encounter (Signed)
Forms have been placed up front for pick up. Pt is aware. Nothing further was needed.

## 2023-05-31 NOTE — Telephone Encounter (Signed)
Did I already complete these forms?

## 2023-05-31 NOTE — Telephone Encounter (Signed)
You have not returned anything to me.

## 2023-06-14 NOTE — Telephone Encounter (Signed)
Re-Sent Provider portion to be signed to Office/ awaiting signature on Nov[employer] APP.

## 2023-06-28 NOTE — Telephone Encounter (Signed)
Patient calling for the following reason: Patient requesting to speak with device team regarding recent appointment in [place] on November 12 and requesting clarification on mychart message.     Attempted to contact device team, unsuccessful. TE routed to device team.      Preferred mode of communication is Phone number on file.  Preferred phone number:  [phone]    Molli Knock to leave message Yes.    Last appointment: 08/14/2022    Next appointment: 08/06/2023 Mike Craze, MD    Aldona Bar, RN  [facility]

## 2023-07-09 ENCOUNTER — Ambulatory Visit (INDEPENDENT_AMBULATORY_CARE_PROVIDER_SITE_OTHER): Payer: Medicare Other | Admitting: *Deleted

## 2023-07-09 DIAGNOSIS — Z0000 Encounter for general adult medical examination without abnormal findings: Secondary | ICD-10-CM | POA: Diagnosis not present

## 2023-07-09 NOTE — Patient Instructions (Signed)
 [name] , Thank you for taking time to come for your Medicare Wellness Visit. I appreciate your ongoing commitment to your health goals. Please review the following plan we discussed and let me know if I can assist you in the future.   Screening recommendations/referrals: Colonoscopy: up to date Mammogram: Education provided Bone Density: Education provided Recommended yearly ophthalmology/optometry visit for glaucoma screening and checkup Recommended yearly dental visit for hygiene and checkup  Vaccinations: Influenza vaccine: up to date Pneumococcal vaccine: up to date Tdap vaccine: Education provided Shingles vaccine: Education provided       Preventive Care 65 Years and Older, Female Preventive care refers to lifestyle choices and visits with your health care provider that can promote health and wellness. What does preventive care include? A yearly physical exam. This is also called an annual well check. Dental exams once or twice a year. Routine eye exams. Ask your health care provider how often you should have your eyes checked. Personal lifestyle choices, including: Daily care of your teeth and gums. Regular physical activity. Eating a healthy diet. Avoiding tobacco and drug use. Limiting alcohol use. Practicing safe sex. Taking low-dose aspirin every day. Taking vitamin and mineral supplements as recommended by your health care provider. What happens during an annual well check? The services and screenings done by your health care provider during your annual well check will depend on your age, overall health, lifestyle risk factors, and family history of disease. Counseling  Your health care provider may ask you questions about your: Alcohol use. Tobacco use. Drug use. Emotional well-being. Home and relationship well-being. Sexual activity. Eating habits. History of falls. Memory and ability to understand (cognition). Work and work astronomer. Reproductive  health. Screening  You may have the following tests or measurements: Height, weight, and BMI. Blood pressure. Lipid and cholesterol levels. These may be checked every 5 years, or more frequently if you are over 6 years old. Skin check. Lung cancer screening. You may have this screening every year starting at age 60 if you have a 30-pack-year history of smoking and currently smoke or have quit within the past 15 years. Fecal occult blood test (FOBT) of the stool. You may have this test every year starting at age 17. Flexible sigmoidoscopy or colonoscopy. You may have a sigmoidoscopy every 5 years or a colonoscopy every 10 years starting at age 56. Hepatitis C blood test. Hepatitis B blood test. Sexually transmitted disease (STD) testing. Diabetes screening. This is done by checking your blood sugar (glucose) after you have not eaten for a while (fasting). You may have this done every 1-3 years. Bone density scan. This is done to screen for osteoporosis. You may have this done starting at age 29. Mammogram. This may be done every 1-2 years. Talk to your health care provider about how often you should have regular mammograms. Talk with your health care provider about your test results, treatment options, and if necessary, the need for more tests. Vaccines  Your health care provider may recommend certain vaccines, such as: Influenza vaccine. This is recommended every year. Tetanus, diphtheria, and acellular pertussis (Tdap, Td) vaccine. You may need a Td booster every 10 years. Zoster vaccine. You may need this after age 41. Pneumococcal 13-valent conjugate (PCV13) vaccine. One dose is recommended after age 27. Pneumococcal polysaccharide (PPSV23) vaccine. One dose is recommended after age 58. Talk to your health care provider about which screenings and vaccines you need and how often you need them. This information is not  intended to replace advice given to you by your health care provider.  Make sure you discuss any questions you have with your health care provider. Document Released: 07/08/2015 Document Revised: 02/29/2016 Document Reviewed: 04/12/2015 Elsevier Interactive Patient Education  2017 [employer].  Fall Prevention in the Home Falls can cause injuries. They can happen to people of all ages. There are many things you can do to make your home safe and to help prevent falls. What can I do on the outside of my home? Regularly fix the edges of walkways and driveways and fix any cracks. Remove anything that might make you trip as you walk through a door, such as a raised step or threshold. Trim any bushes or trees on the path to your home. Use bright outdoor lighting. Clear any walking paths of anything that might make someone trip, such as rocks or tools. Regularly check to see if handrails are loose or broken. Make sure that both sides of any steps have handrails. Any raised decks and porches should have guardrails on the edges. Have any leaves, snow, or ice cleared regularly. Use sand or salt on walking paths during winter. Clean up any spills in your garage right away. This includes oil or grease spills. What can I do in the bathroom? Use night lights. Install grab bars by the toilet and in the tub and shower. Do not use towel bars as grab bars. Use non-skid mats or decals in the tub or shower. If you need to sit down in the shower, use a plastic, non-slip stool. Keep the floor dry. Clean up any water  that spills on the floor as soon as it happens. Remove soap buildup in the tub or shower regularly. Attach bath mats securely with double-sided non-slip rug tape. Do not have throw rugs and other things on the floor that can make you trip. What can I do in the bedroom? Use night lights. Make sure that you have a light by your bed that is easy to reach. Do not use any sheets or blankets that are too big for your bed. They should not hang down onto the floor. Have a  firm chair that has side arms. You can use this for support while you get dressed. Do not have throw rugs and other things on the floor that can make you trip. What can I do in the kitchen? Clean up any spills right away. Avoid walking on wet floors. Keep items that you use a lot in easy-to-reach places. If you need to reach something above you, use a strong step stool that has a grab bar. Keep electrical cords out of the way. Do not use floor polish or wax that makes floors slippery. If you must use wax, use non-skid floor wax. Do not have throw rugs and other things on the floor that can make you trip. What can I do with my stairs? Do not leave any items on the stairs. Make sure that there are handrails on both sides of the stairs and use them. Fix handrails that are broken or loose. Make sure that handrails are as long as the stairways. Check any carpeting to make sure that it is firmly attached to the stairs. Fix any carpet that is loose or worn. Avoid having throw rugs at the top or bottom of the stairs. If you do have throw rugs, attach them to the floor with carpet tape. Make sure that you have a light switch at the top of the stairs  and the bottom of the stairs. If you do not have them, ask someone to add them for you. What else can I do to help prevent falls? Wear shoes that: Do not have high heels. Have rubber bottoms. Are comfortable and fit you well. Are closed at the toe. Do not wear sandals. If you use a stepladder: Make sure that it is fully opened. Do not climb a closed stepladder. Make sure that both sides of the stepladder are locked into place. Ask someone to hold it for you, if possible. Clearly mark and make sure that you can see: Any grab bars or handrails. First and last steps. Where the edge of each step is. Use tools that help you move around (mobility aids) if they are needed. These include: Canes. Walkers. Scooters. Crutches. Turn on the lights when you  go into a dark area. Replace any light bulbs as soon as they burn out. Set up your furniture so you have a clear path. Avoid moving your furniture around. If any of your floors are uneven, fix them. If there are any pets around you, be aware of where they are. Review your medicines with your doctor. Some medicines can make you feel dizzy. This can increase your chance of falling. Ask your doctor what other things that you can do to help prevent falls. This information is not intended to replace advice given to you by your health care provider. Make sure you discuss any questions you have with your health care provider. Document Released: 04/07/2009 Document Revised: 11/17/2015 Document Reviewed: 07/16/2014 Elsevier Interactive Patient Education  2017 [employer].

## 2023-07-09 NOTE — Progress Notes (Signed)
 Subjective:   Sarah Brock is a 68 y.o. female who presents for Medicare Annual (Subsequent) preventive examination.  Visit Complete: Virtual I connected with  Sarah Brock on 07/09/23 by a audio enabled telemedicine application and verified that I am speaking with the correct person using two identifiers.  Patient Location: Home  Provider Location: Home Office  I discussed the limitations of evaluation and management by telemedicine. The patient expressed understanding and agreed to proceed.  Vital Signs: Because this visit was a virtual/telehealth visit, some criteria may be missing or patient reported. Any vitals not documented were not able to be obtained and vitals that have been documented are patient reported.  Patient Medicare AWV questionnaire was completed by the patient on 07-09-2023; I have confirmed that all information answered by patient is correct and no changes since this date.  Cardiac Risk Factors include: advanced age (>94men, >65 women);diabetes mellitus     Objective:    There were no vitals filed for this visit. There is no height or weight on file to calculate BMI.     07/09/2023   10:21 AM 08/04/2021    9:26 AM 06/12/2021    3:44 PM 12/12/2020    3:29 PM 05/09/2020    3:57 PM 12/10/2019   10:48 AM 12/04/2019    2:23 PM  Advanced Directives  Does Patient Have a Medical Advance Directive? No Yes No No Yes  Yes  Type of Advance Directive  Out of facility DNR (pink MOST or yellow form)   Healthcare Power of [place];Living will  Healthcare Power of [place];Living will  Does patient want to make changes to medical advance directive?  No - Patient declined   No - Patient declined No - Patient declined No - Patient declined  Copy of Healthcare Power of Attorney in Chart?     No - copy requested  No - copy requested  Would patient like information on creating a medical advance directive? No - Patient declined  No - Patient declined Yes  (MAU/Ambulatory/Procedural Areas - Information given)     Pre-existing out of facility DNR order (yellow form or pink MOST form)  Pink MOST form placed in chart (order not valid for inpatient use)         Current Medications (verified) Outpatient Encounter Medications as of 07/09/2023  Medication Sig   Accu-Chek FastClix Lancets MISC Use to test blood sugar twice daily. Dx: E11.9   amLODipine  (NORVASC ) 10 MG tablet Take 1 tablet (10 mg total) by mouth daily.   BIOTIN PO Take 1 tablet by mouth daily.   Blood Glucose Monitoring Suppl (ACCU-CHEK GUIDE) w/Device KIT Use to test blood sugar twice daily. Dx: E11.9   cetirizine (ZYRTEC) 10 MG tablet Take 10 mg by mouth at bedtime.   ciclopirox  (PENLAC ) 8 % solution Apply topically at bedtime. Apply over nail and surrounding skin. Apply daily over previous coat. After seven (7) days, may remove with alcohol and continue cycle.   COLLAGEN PO Take by mouth daily. Powder formula, adds to coffee   ezetimibe  (ZETIA ) 10 MG tablet Take 1 tablet (10 mg total) by mouth daily.   ferrous sulfate  325 (65 FE) MG tablet Take 1 tablet (325 mg total) by mouth 2 (two) times daily with a meal.   glucose blood (ACCU-CHEK GUIDE) test strip Use to test blood sugar twice daily. Dx: E11.9   Lancets Misc. (ACCU-CHEK FASTCLIX LANCET) KIT by Does not apply route.   levETIRAcetam  (KEPPRA ) 500 MG tablet Take  1 tablet (500 mg total) by mouth every 12 (twelve) hours.   lisinopril  (ZESTRIL ) 30 MG tablet Take 1 tablet (30 mg total) by mouth daily.   metFORMIN  (GLUCOPHAGE ) 1000 MG tablet Take 1 tablet (1,000 mg total) by mouth 2 (two) times daily with a meal.   rosuvastatin  (CRESTOR ) 5 MG tablet TAKE 1/2 TABLET AT BEDTIME FOR CHOLESTEROL   Semaglutide ,0.25 or 0.5MG /DOS, (OZEMPIC , 0.25 OR 0.5 MG/DOSE,) 2 MG/3ML SOPN Inject 0.25 mg once weekly for four weeks then increase to 0.5 mg once weekly   No facility-administered encounter medications on file as of 07/09/2023.    Allergies  (verified) Simvastatin  and Shrimp [shellfish allergy]   History: Past Medical History:  Diagnosis Date   Allergy    Anemia, unspecified    Asthma    as a child, no problem as adult, no inhaler   Bradycardia    past hx    Diabetes mellitus    Type II   History of blood transfusion    pre hysterectomy   Osteoarthrosis, unspecified whether generalized or localized, unspecified site    Other and unspecified hyperlipidemia    Other convulsions    last one 2013   Other malaise and fatigue    Other specified cardiac dysrhythmias(427.89) 2006   history of Bradycardia .  wore a mointor nothing found   Seizure disorder (HCC)    last seizure 2013 per pt 05-12-2019 PV    Unspecified essential hypertension    Unspecified vitamin D  deficiency    Past Surgical History:  Procedure Laterality Date   APPENDECTOMY     BREAST LUMPECTOMY Right 1997   BREAST LUMPECTOMY Bilateral    neg for cancer   CHOLECYSTECTOMY N/A 02/28/2016   Procedure: LAPAROSCOPIC CHOLECYSTECTOMY;  Surgeon: Herlene Beverley Bureau, MD;  Location: [facility] OR;  Service: General;  Laterality: N/A;   IR CATHETER TUBE CHANGE  01/02/2018   IR RADIOLOGIST EVAL & MGMT  12/25/2017   IR RADIOLOGIST EVAL & MGMT  01/08/2018   LAPAROSCOPIC APPENDECTOMY N/A 01/15/2018   Procedure: LAPAROSCOPIC APPENDECTOMY CONVERTED TO OPEN APPENDECTOMY SMALL BOWEL RESECTION;  Surgeon: Belinda Cough, MD;  Location: [facility] OR;  Service: General;  Laterality: N/A;   SUPRA-UMBILICAL HERNIA N/A 12/10/2019   Procedure: OPEN REPAIR OF SUPRA-UMBILICAL VENTRAL HERNIA WITH MESH;  Surgeon: Belinda Cough, MD;  Location: [facility] OR;  Service: General;  Laterality: N/A;   VAGINAL HYSTERECTOMY     still with ovaries   Family History  Problem Relation Age of Onset   Diabetes Father    Kidney failure Father    Heart disease Father    Hypertension Father    Seizures Father    Colon polyps Neg Hx    Colon cancer Neg Hx    Rectal cancer Neg Hx    Stomach cancer Neg Hx     Social History   Socioeconomic History   Marital status: Married    Spouse name: Not on file   Number of children: Not on file   Years of education: Not on file   Highest education level: Some college, no degree  Occupational History   Occupation: PSYCHOLOGIST, SPORT AND EXERCISE    Employer: [employer]  Tobacco Use   Smoking status: Never   Smokeless tobacco: Never  Vaping Use   Vaping status: Never Used  Substance and Sexual Activity   Alcohol use: No    Alcohol/week: 0.0 standard drinks of alcohol   Drug use: No   Sexual activity: Not Currently  Other Topics Concern  Not on file  Social History Narrative   Not on file   Social Drivers of Health   Financial Resource Strain: Low Risk  (07/09/2023)   Overall Financial Resource Strain (CARDIA)    Difficulty of Paying Living Expenses: Not hard at all  Food Insecurity: No Food Insecurity (07/09/2023)   Hunger Vital Sign    Worried About Running Out of Food in the Last Year: Never true    Ran Out of Food in the Last Year: Never true  Transportation Needs: No Transportation Needs (07/09/2023)   PRAPARE - Administrator, Civil Service (Medical): No    Lack of Transportation (Non-Medical): No  Physical Activity: Unknown (07/09/2023)   Exercise Vital Sign    Days of Exercise per Week: Patient declined    Minutes of Exercise per Session: Not on file  Stress: No Stress Concern Present (07/09/2023)   [employer] of Occupational Health - Occupational Stress Questionnaire    Feeling of Stress : Not at all  Social Connections: Moderately Integrated (07/09/2023)   Social Connection and Isolation Panel [NHANES]    Frequency of Communication with Friends and Family: More than three times a week    Frequency of Social Gatherings with Friends and Family: More than three times a week    Attends Religious Services: More than 4 times per year    Active Member of [employer] or Organizations: Yes    Attends Engineer, Structural: More than  4 times per year    Marital Status: Divorced    Tobacco Counseling Counseling given: Not Answered   Clinical Intake:  Pre-visit preparation completed: Yes  Pain : No/denies pain     Diabetes: Yes CBG done?: No Did pt. bring in CBG monitor from home?: No  How often do you need to have someone help you when you read instructions, pamphlets, or other written materials from your doctor or pharmacy?: 1 - Never  Interpreter Needed?: No  Information entered by :: Mliss Graff LPN   Activities of Daily Living    07/09/2023   10:20 AM 07/09/2023    8:11 AM  In your present state of health, do you have any difficulty performing the following activities:  Hearing? 0 0  Vision? 1 1  Difficulty concentrating or making decisions? 0 0  Walking or climbing stairs? 0 0  Dressing or bathing? 0 0  Doing errands, shopping? 0 0  Preparing Food and eating ? N N  Using the Toilet? N N  In the past six months, have you accidently leaked urine? Y Y  Do you have problems with loss of bowel control? N N  Managing your Medications? N N  Managing your Finances? N N  Housekeeping or managing your Housekeeping? N N    Patient Care Team: Corwin Antu, FNP as PCP - General (Family Medicine)  Indicate any recent Medical Services you may have received from other than [facility] providers in the past year (date may be approximate).     Assessment:   This is a routine wellness examination for [place].  Hearing/Vision screen Hearing Screening - Comments:: No trouble hearing Vision Screening - Comments:: Not up to date   Goals Addressed             This Visit's Progress    Patient Stated       Continue current lifestyle        Depression Screen    07/09/2023   10:23 AM 03/14/2023    8:24  AM 08/15/2022   10:28 AM 06/12/2021    3:43 PM 11/04/2019    3:35 PM 12/31/2018    3:45 PM 10/08/2018   11:39 AM  PHQ 2/9 Scores  PHQ - 2 Score 0 0 0 0 0 0 0  PHQ- 9 Score 0 0 1        Fall  Risk    07/09/2023   10:18 AM 07/09/2023    8:11 AM 03/14/2023    8:24 AM 08/15/2022   10:27 AM 08/04/2021    9:27 AM  Fall Risk   Falls in the past year? 0 0 0 0 0  Number falls in past yr: 0 0 0 0 0  Injury with Fall? 0 0 0 0 0  Risk for fall due to :   No Fall Risks  No Fall Risks  Follow up Falls evaluation completed;Education provided;Falls prevention discussed  Falls evaluation completed Falls evaluation completed;Education provided;Falls prevention discussed Falls evaluation completed    MEDICARE RISK AT HOME: Medicare Risk at Home Any stairs in or around the home?: (Patient-Rptd) Yes If so, are there any without handrails?: (Patient-Rptd) No Home free of loose throw rugs in walkways, pet beds, electrical cords, etc?: (Patient-Rptd) Yes Adequate lighting in your home to reduce risk of falls?: (Patient-Rptd) Yes Life alert?: (Patient-Rptd) No Use of a cane, walker or w/c?: (Patient-Rptd) No Grab bars in the bathroom?: (Patient-Rptd) No Shower chair or bench in shower?: (Patient-Rptd) No Elevated toilet seat or a handicapped toilet?: (Patient-Rptd) No  TIMED UP AND GO:  Was the test performed?  No    Cognitive Function:    06/12/2021    4:21 PM  MMSE - Mini Mental State Exam  Orientation to time 5  Orientation to Place 5  Registration 3  Attention/ Calculation 5  Recall 2  Language- name 2 objects 2  Language- repeat 1  Language- follow 3 step command 3  Language- read & follow direction 1  Write a sentence 1  Copy design 1  Total score 29        07/09/2023   10:18 AM  6CIT Screen  What Year? 0 points  What month? 0 points  What time? 0 points  Count back from 20 0 points  Months in reverse 0 points  Repeat phrase 0 points  Total Score 0 points    Immunizations Immunization History  Administered Date(s) Administered   Fluad Trivalent(High Dose 65+) 03/14/2023   Influenza Split 03/31/2013   Influenza, High Dose Seasonal PF 04/16/2016    Influenza,inj,Quad PF,6+ Mos 03/20/2019, 03/25/2020, 04/05/2021   Influenza-Unspecified 03/26/2015, 03/25/2018   PFIZER(Purple Top)SARS-COV-2 Vaccination 06/27/2019, 07/17/2019, 04/08/2020   PNEUMOCOCCAL CONJUGATE-20 03/14/2023   Pneumococcal Conjugate-13 07/09/2012, 05/19/2014   Pneumococcal Polysaccharide-23 09/26/2016   Tdap 06/25/2009   Zoster Recombinant(Shingrix ) 02/10/2021, 07/27/2021    TDAP status: Due, Education has been provided regarding the importance of this vaccine. Advised may receive this vaccine at local pharmacy or Health Dept. Aware to provide a copy of the vaccination record if obtained from local pharmacy or Health Dept. Verbalized acceptance and understanding.  Flu Vaccine status: Up to date  Pneumococcal vaccine status: Up to date  Covid-19 vaccine status: Information provided on how to obtain vaccines.   Qualifies for Shingles Vaccine? No   Zostavax completed No   Shingrix  Completed?: No.    Education has been provided regarding the importance of this vaccine. Patient has been advised to call insurance company to determine out of pocket expense if they have  not yet received this vaccine. Advised may also receive vaccine at local pharmacy or Health Dept. Verbalized acceptance and understanding.  Screening Tests Health Maintenance  Topic Date Due   COVID-19 Vaccine (4 - 2024-25 season) 07/25/2023 (Originally 02/24/2023)   OPHTHALMOLOGY EXAM  10/29/2023 (Originally 09/09/1965)   MAMMOGRAM  01/06/2024 (Originally 05/19/2011)   DEXA SCAN  07/08/2024 (Originally 09/09/2020)   Diabetic kidney evaluation - Urine ACR  08/16/2023   HEMOGLOBIN A1C  09/11/2023   Diabetic kidney evaluation - eGFR measurement  03/13/2024   Colonoscopy  05/27/2024   FOOT EXAM  07/08/2024   Medicare Annual Wellness (AWV)  07/08/2024   Pneumonia Vaccine 56+ Years old  Completed   INFLUENZA VACCINE  Completed   Hepatitis C Screening  Completed   Zoster Vaccines- Shingrix   Completed   HPV  VACCINES  Aged Out   DTaP/Tdap/Td  Discontinued    Health Maintenance  There are no preventive care reminders to display for this patient.   Colorectal cancer screening: Type of screening: Colonoscopy. Completed 2020. Repeat every 5 years  Mammogram   Education provided  Bone Density  Education provided  Lung Cancer Screening: (Low Dose CT Chest recommended if Age 26-80 years, 20 pack-year currently smoking OR have quit w/in 15years.) does not qualify.   Lung Cancer Screening Referral:   Additional Screening:  Hepatitis C Screening: does not qualify; Completed 2019  Vision Screening: Recommended annual ophthalmology exams for early detection of glaucoma and other disorders of the eye. Is the patient up to date with their annual eye exam?  No  Who is the provider or what is the name of the office in which the patient attends annual eye exams? Education provided If pt is not established with a provider, would they like to be referred to a provider to establish care? No .   Dental Screening: Recommended annual dental exams for proper oral hygiene  Nutrition Risk Assessment:  Has the patient had any N/V/D within the last 2 months?  No  Does the patient have any non-healing wounds?  No  Has the patient had any unintentional weight loss or weight gain?  No   Diabetes:  Is the patient diabetic?  Yes  If diabetic, was a CBG obtained today?  No  Did the patient bring in their glucometer from home?  No  How often do you monitor your CBG's? Does not check   Education provided.   Financial Strains and Diabetes Management:  Are you having any financial strains with the device, your supplies or your medication? No .  Does the patient want to be seen by Chronic Care Management for management of their diabetes?  No  Would the patient like to be referred to a Nutritionist or for Diabetic Management?  No   Diabetic Exams:  Diabetic Eye Exam: . Overdue for diabetic eye exam. Pt has  been advised about the importance in completing this exam. A  Diabetic Foot Exam: . Pt has been advised about the importance in completing this exam..    Community Resource Referral / Chronic Care Management: CRR required this visit?  No   CCM required this visit?  No     Plan:     I have personally reviewed and noted the following in the patient's chart:   Medical and social history Use of alcohol, tobacco or illicit drugs  Current medications and supplements including opioid prescriptions. Patient is not currently taking opioid prescriptions. Functional ability and status Nutritional status Physical activity Advanced  directives List of other physicians Hospitalizations, surgeries, and ER visits in previous 12 months Vitals Screenings to include cognitive, depression, and falls Referrals and appointments  In addition, I have reviewed and discussed with patient certain preventive protocols, quality metrics, and best practice recommendations. A written personalized care plan for preventive services as well as general preventive health recommendations were provided to patient.     Mliss Graff, LPN   [id number]   After Visit Summary: (MyChart) Due to this being a telephonic visit, the after visit summary with patients personalized plan was offered to patient via MyChart   Nurse Notes:

## 2023-07-11 ENCOUNTER — Telehealth: Payer: Self-pay | Admitting: Family

## 2023-07-11 NOTE — Telephone Encounter (Signed)
Ozempic was received in the mail on 07/11/2023 from [employer]. #4 boxes were received and placed in the fridge. Lot #: [id number] Exp: 02/22/2025 She is aware and will come by to pick the medication. Nothing further was needed.

## 2023-07-12 NOTE — Telephone Encounter (Signed)
Patient Picked up medication

## 2023-07-30 ENCOUNTER — Telehealth: Payer: Self-pay | Admitting: Family

## 2023-07-30 ENCOUNTER — Encounter: Payer: Self-pay | Admitting: *Deleted

## 2023-07-30 NOTE — Progress Notes (Signed)
 Pharmacy Medication Assistance Program Note    07/30/2023  Patient ID: Sarah Brock, female   DOB: 12/08/1955, 68 y.o.   MRN: [id number]     05/16/2023  Outreach Medication One  Manufacturer Medication One Novo Nordisk  [facility] Ozempic   Type of Radiographer, Therapeutic Assistance  Date Application Sent to Prescriber 05/16/2023  Name of Prescriber Ginger Patrick  Patient Assistance Determination Approved  Approval End Date 06/24/2024

## 2023-07-30 NOTE — Telephone Encounter (Signed)
Ozempic was received in the mail on 07/30/2023 from [employer]. #4 boxes were received and placed in the fridge. Lot #: [id number] Exp: 02/22/2026 Pt is aware and will come by to pick the medication. Nothing further was needed.

## 2023-08-02 NOTE — Telephone Encounter (Signed)
 Patient picked up medication

## 2023-08-06 ENCOUNTER — Ambulatory Visit: Admit: 2023-08-06 | Payer: MEDICARE | Attending: Physician | Primary: Physician

## 2023-08-06 DIAGNOSIS — I441 Atrioventricular block, second degree: Secondary | ICD-10-CM

## 2023-08-06 NOTE — Progress Notes (Signed)
I performed this evaluation using real-time telehealth tools, including a live video Zoom connection between my location and the patient's location. Prior to initiating, the patient consented to perform this evaluation using telehealth tools.     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 in Cardiac Electrophysiology today for further evaluation of symptomatic bradycardia, status post dual-chamber pacemaker, atrial fibrillation, and NSVT.    [name] is a 68 y.o. 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    Allow me to review her history for the electronic MEDICAL RECORD NUMBER Exertional fatigue and mild shortness of breath started on 08/23/2015 and persisted thereafter, were also sometimes associated with lightheadedness.  She had previously been very active, feeding and riding her horse nearly every day.  Evaluation was notable for a baseline right bundle branch block, and periods of symptomatic 2:1 AV block while reaching peak exercise on a treadmill stress test (see details below).  Holter monitoring also reportedly showed blocked PAC's with resultant bradycardia, though to my review blocked atrial activity was not typically premature (see details below). 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    A dual-chamber device was implanted without complication on 10/31/2015.  Thereafter, she noticed significant improvement in her symptoms, but had one episode very similar to her pre-pacemaker symptoms on 11/18/2015 and several milder episodes afterwards aborted by resting.  Device function was normal -- at follow-up 11/2015, she paced 5% in the RA  and 1% in the RV.  She underwent breast augmentation surgery on 04/18/2016.    At her visit in April 2018, she reported having a "wide complex tachycardia" for 5 minutes (up to 115 bpm) after a colonoscopy 06/2016.  She also started having episodes of palpitations in February and specifically recalled symptomatic episodes on 2/12 and 2/24 -- the latter correlated with a >1 hour episode of atrial fibrillation recorded by the device.  She was started on aspirin 81 mg daily and recommended to take metoprolol 12.5 mg twice daily, though opted not to take it.    At her visit in 03/2017, she reported shortness of breath with exertion (such as walking up stairs) and wondered if there could be a functional issue with her heart -- she actually contacted me prior to the visit to request that an echocardiogram be scheduled the same day.  The echocardiogram showed normal LV function and no hemodynamically significant valvular disease.  Device interrogation showed 62 minutes of atrial fibrillation between April and October 2018, with the longest episode lasting 11:30 minutes on 01/01/2017.  Rate histograms showed a shift to the left, with atrial sensing or pacing in the 60s more than 50% of the time and only rare atrial sensing above 90 bpm.  We agreed to try adding rate-response to her device, and to continue conservative management of the rare atrial fibrillation.  The rate-response was later disabled because she was feeling "jittery."  Device check in 06/2017 showed 0.1% atrial high rate detection (10 episodes), with the longest 53 minutes.  Device check in 10/2017 showed 0.1% atrial high rate detection, with the longest episode 35 minutes.  There was also a  20 beat episode of NSVT recorded on 09/17/2017, correlating with an episode of lightheadedness (see my note dated 11/15/2017).  I recommended she try taking metoprolol 12.5 mg twice daily and consider cardiac MRI and/or PET (particularly given the paroxysmal AV block and atrial  fibrillation).  Cardiac MRI in 12/2017 revealed mid-wall and subepicardial late gadolinium enhancement in the lateral wall at the level of the base.  A follow-up PET CT scan showed no abnormal FDG uptake and no resting perfusion defects.  She contacted me 03/19/2018 to report some chest pain "off and on" starting the day before and sent remote device transmissions.  At her visit in 04/2018, she was feeling well overall. She had occasional symptoms that corresponded to atrial fibrillation, though she did not feel it was impacting her life significantly. She denied any additional lightheadedness. She preferred to continue metoprolol for atrial fibrillation.   She had a follow-up echocardiogram in early May 2020 showing normal LV function (details below).  Device interrogations continued to show an overall low burden of atrial fibrillation.  At follow-up 11/18/2018, she was feeing "pretty good". She had some sensation of an irregular heart beat at night affecting sleep. We increased her metoprolol evening dose. Device interrogation showed rare brief NSVT.   On 04/14/2019, she presented to the ED at [facility] with episodes of low blood pressure of 80s systolic at home over the prior several days and some "off or blurry" vision. She reportedly had no focal neurologic deficits, and per records from her hospitalization, it was not felt symptoms were consistent with stroke. She was treated with IV fluids and discharged from ED.   Remote device interrogation 04/16/2019 showed normal function, with 106 episodes of atrial fibrillation since 02/10/2019 (burden 1.7%).  The longest formally measured episode was 3 hours 40 min on 03/11/2019, though based on the log I suspect she was continuously in atrial fibrillation from about midnight until 9 am on 04/13/2019; average ventricular rates were in the 80s.  At follow-up 04/21/2019, she was  "hanging in there."  During the recent atrial fibrillation episode, she felt an irregular  heart rate, but not a "super high" pulse.  She did not take her metoprolol, being concerned about lowering her blood pressure further. She continued to have some shortness of breath with exertion, particularly at altitude, forcing her to rest more than she felt she should have to.  We recalled that previously enabling the rate-response feature on her pacemaker in 03/2017 made her feel anxious and jittery.  We reviewed rhythm control options, but she preferred to continue low-dose metoprolol to try to prevent episodes.  Device interrogation 10/12/2019 showed 54 high rate atrial episodes, with burden 3% -- the longest was 08/01/2019 for 6 hours and 24 minutes.  Histograms show predominant V rates 60-140 bpm with minimal counts in bins to 180 bpm during AF.  She contacted me with questions about 10/10/2019 after 11pm: episode logs show atrial fibrillation from about 1 am to about 6 am on 10/11/2019.  At her visit 10/20/2019, she reported fatigue and low blood pressure that prompted her to decrease her metoprolol back to 12.5 mg twice daily.  She got the first dose of Pfizer Covid-19 vaccine in 06/2019 and had atrial fibrillation that night -- she planned to defer the second shot for the time being. After discussion, she decided to proceed with ablation for the atrial fibrillation.  The ablation procedure was performed 12/21/2019.  All 4 PVs were isolated.  She was discharged the  following day.    Remote transmission from her device 02/01/2020 recorded 2 episodes of non-sustained AF since the ablation, on 01/23/2020 for 9 seconds and on 01/26/2020 for 13 seconds -- however, EGMs appeared consistent with brief oversensing.  At follow-up in 01/2020, she was feeling "OK" overall, with days when she felt very "tired" and "out of it."  She had some palpitations that didn't feel like atrial fibrillation. Device interrogation showed no arrhythmia episodes, and I recommended she continue metoprolol therapy.  She discontinued apixaban  03/22/2020 (3 months post-procedure)  She contacted me in 03/2020 with episodes of palpitations and brief episodes of sharp, "shooting chest pain."  Device interrogation showed no major arrhythmia episodes.  There was a total of 24 minutes of atrial fibrillation from 8/9 through 10/1, but none lasting more than a minute (so no recordings to review).  We decided to proceed with Zio monitoring.  The Zio and subsequent interrogations showed no atrial fibrillation.    She developed Covid 07/06/2020.  She subsequently reported sensations like she was not getting enough air (different than atrial fibrillation).  Device interrogation 07/19/2020 showed 4 episodes of AT/AF since 06/14/2020, the longest 30 seconds on 07/12/2020 at 3:23pm.  EGMs all showed atrial oversensing.  At follow-up 07/26/2020, she was feeling well and had recovered from Covid. She felt that the atrial fibrillation symptoms were better since the ablation in 11/2019, and there were no true atrial fibrillation events on pacemaker interrogation.  She did report episodes of "strong" heart beats she thought could be NSVT. She was planning to reduce her work hours and switch to [employer]. We agreed to continue monitoring arrhythmia burden via her pacemaker, and increase metoprolol if NSVT episodes became more frequent or bothersome.  At follow-up in 07/2021, he felt well overall -- "kind of funny" for about a month after device programming, but that improved.  She retired 08/11/2020 and was doing more outdoor activities, but not riding much anymore.  She noted only 1 or 2 episodes that she thought could be NSVT, but without symptoms nearly as strong as the first time. She denied symptoms of recurrent atrial fibrillation.  We agreed to continue metoprolol and arrhythmia monitoring via her device. We deferred starting therapeutic anticoagulation.   Device monitoring over the next year showed no significant arrhythmia episodes.  At follow-up in 07/2022, she denied any  significant arrhythmia-related problems, but she had difficult issues related to bladder prolapse since mid-2023. She also noted several weeks of left-sided chest discomfort (lower left axilla) -- she mostly noticed it when she drank coffee, with occasional relief with ibuprofen. The discomfort would lasts up to half a day, and it was often worse when she bent over (as when pulling weeds).  After discussion, we agreed to continue arrhythmia monitoring via her pacemaker. I recommended she speak to her local cardiologist about the chest discomfort and schedule a stress test there if appropriate.  She contacted [place] in 09/2022, 12/2022, 03/2023 , and 05/2023 to inquire about pacemaker interrogations -- there was 0% atrial fibrillation since the prior check in 06/2022 (though we did not have data between 03/2023 and 05/07/2023, when the device was interrogated elsewhere).      Today, Ms. Salmi is feeling fine. She has not had any symptoms suggestive of atrial fibrillation.  Once in a while she'll feel a rapid heart rate that lasts only seconds.  She has started having more episodes of dizziness, and chest discomfort mid-way to 3/4 down on the left side of  her rib case.  She recently had a carotid ultrasound and an echocardiogram locally, though she is now between cardiologists -- she is scheduled to see Dr. Otelia Limes later this month, and she has a stress test scheduled this week.    We discussed questions she had about the pacemaker battery and advertisements she has seen for Watchman devices and maze procedures.      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    History of motion sickness     IBS (irritable bowel syndrome) 10/18/2015    IBS (irritable bowel syndrome)     Mobitz type 2 second degree atrioventricular block 10/18/2015    s/p pacemaker     Pacemaker 11/29/2015    RBBB 10/18/2015    Thyroid nodule 10/18/2015    since 08-31-01        CURRENT MEDICATIONS     Your Medications at the End of This Visit         Disp Refills Start End    diazePAM (VALIUM) 5 mg tablet -- -- 05/11/2021 --    Sig - Route: Take 1 tablet (5 mg total) by mouth daily as needed - Oral    Class: Historical Med    estrogens, conjugated, (PREMARIN) vaginal cream -- --  --    Sig - Route: Place into the vagina daily - Vaginal    Class: Historical Med    metoprolol tartrate (LOPRESSOR) 25 mg tablet -- --  --    Sig - Route: Take 0.5 tablets (12.5 mg total) by mouth in the morning and 0.5 tablets (12.5 mg total) in the evening. - Oral    Class: Historical Med            ALLERGIES     Allergies as of 08/05/2023  Review status set to Review Complete by Burnetta Sabin on 07/31/2021        Severity Noted Reaction Type Reactions    Sulfamethoxazole-trimethoprim Not Specified 10/18/2015    Itching    Insomnia            SOCIAL HISTORY     She reports that she has never smoked. She has never used smokeless tobacco. She reports current alcohol use. She reports that she does not use drugs.    She is divorced.  She has 3 children.  She lives in [place], [place].  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    She has 2 horses (a Palomino and a Bay, both over 51 years old).  She also wakeboards in the summer.    FAMILY HISTORY     Her father underwent CABG in his late 46's and has cardiomyopathy.  He was a heavy smoker. He passed away in early 2018-08-31.    Her mother had a heart attack around age 28.  She died after complications from a head injury in June 2016 after being knocked down by a horse.    Her grandfather had colon cancer.    REVIEW OF SYSTEMS     Gastrointestinal:  + IBS with cramping and gas.  No blood in the stool.  + polyps on colonoscopy in 2013-08-31.    PHYSICAL EXAMINATION     Constitutional: Patient appears well-developed and  well-nourished. Pleasant and appropriately interactive.  Head: Normocephalic and atraumatic.   Eyes: Wearing glasses  Pulmonary/Chest: Effort normal. No respiratory distress. No cough.  Neurological: Alert and oriented to person, place, and time.   Psychiatric: Normal mood and affect. Behavior is normal. Judgment and thought content normal.   Skin: No visible rash.     DATA     I reviewed prior 12-lead EKGs:  - 12/21/2019: sinus rhythm with right bundle branch block at 75 bpm  - 04/02/2017: competing sinus rhythm and atrial pacing at 60 bpm with intact AV conduction and right bundle branch block (QRSd 124 msec).  - 10/02/2016: atrial pacing at 61 bpm with intact AV conduction and right bundle branch block  - 11/29/2015: normal sinus rhythm with right bundle branch block at 64 bpm.  - 10/18/2015: sinus bradycardia at 57 bpm with right bundle branch block  - 08/29/2015: sinus bradycardia at 57 bpm with right bundle branch block.    I reviewed prior pacemaker interrogations:  06/25/2023 (remote): Ap 41%, Vp 30%.  Battery 3.5 years. AT/AF <0.1%.  4 seconds noted on 06/22/2023:      07/18/2022 (remote): Ap 34.8%, Vp 24.2%. Battery 4 years.  AT/AF <0.1% since 03/2022 -- 2 episodes, both less than 1 minute (0% from 12/2021-03/2022).  No EGMs  06/15/2021 (remote): Vp 14.9%.  No arrhythmia episodes since 05/30/2021  05/30/2021 (remote): Several AT/AF episodes, with EGMs showing atrial oversensing and not true atrial fibrillation. Atrial sensitivity decreased (0.45 mV to 0.9 mV)  07/19/2020 (remote): 4 episodes of AT/AF since 06/14/2020, the longest 30 seconds on 07/12/2020 at 3:23pm.  There was one 8 beat episode of NSVT on 06/25/2020 at about 5am.  AT/AF EGMs all showed atrial oversensing and not true atrial fibrillation:      02/01/2020 (remote): 2 episodes of non-sustained AF since the ablation, on 01/23/2020 for 9 seconds and on 01/26/2020 for 13 seconds, but EGMs appeared consistent with brief oversensing and not true atrial  fibrillation.  10/12/2019 (remote): 54 high rate atrial episodes, with 46% A-pacing and 4% V-pacing.  AT/AF burden was 3% -- the longest episode was 08/01/2019 for 6 hours and 24 minutes (though other shorter episodes may have been part of one longer episode, including on 10/11/2019).  Histograms show predominant V rates 60-140 bpm with minimal counts in bins to 180 bpm during AF.    04/16/2019 (remote): normal overall function, with 48% atrial pacing and 2.9% ventricular pacing.  Total atrial fibrillation burden was 1.7% since 01/2019, with longest episode 3 hours 40 minutes on 03/11/2019.  There were multiple episodes 10/18-10/19/2020.  The longest episode within that time period measured formally was 1 h 20 min, though based on the log I suspect she was continuously in atrial fibrillation from ~midnight until ~9am on 04/13/2019.  11/18/2018 (see separate full report in [facility]):    - She paces 45.4% in the RA (stable) and 4.6% in the RV (stable).    - Atrial fibrillation burden has been 0.9%, the longest episode lasting 5 hours on 11/07/2018 with max V-rate of 130 bpm (max V-rate during all atrial fibrillation episodes 133 bpm)  - No NSVT recorded  - Many longer episodes were overnight    I reviewed the report and tracings from a [employer] monitor worn 6/8-6/23/2022, which showed:  * The observed rhythms are paced rhythm with intrinsic beat.  * The Maximum Heart Rate recorded was 150 bpm, Day 6 / 05:30:53 pm, the Minimum Heart Rate recorded was 59 bpm, Day  3 / 04:00:42 pm and the Average Heart Rate was 66 bpm.  * There were 132 PVCs with a burden of 0.01 %.  * There were 4 PSVCs with a burden of < 0.01 %. There was 1 occurrences of Supraventricular Tachycardia with the longest episode 4 beats, Day 6 / 05:30:57 pm and the fastest episode 150 bpm, Day 6 / 05:30:57 pm.  * QTcB >460 ms 59.83%  * There were 0 Patient reported events.    I reviewed the report and tracings from a Zio patch worn 10/13-10/27/2021, which showed:  -  Patient had a min HR of 60 bpm, max HR of 103 bpm, and avg HR of 66 bpm. Predominant underlying rhythm was Sinus Rhythm.   - Isolated SVEs were rare (<1.0%), SVE Couplets were rare (<1.0%), and SVE Triplets were rare (<1.0%).   - Isolated VEs were rare (<1.0%), VE Couplets were rare (<1.0%), and no VE Triplets were present.  - Final interpretation:  1. Predominant rhythm is sinus. Intermittent atrial and ventricular pacing noted.  2. Supraventricular and ventricular ectopic burden as above.  3. Single patient triggered event demonstrates sinus rhythm.  4. No identified atrial fibrillation.    I reviewed the report and tracings from a 24-hour Holter monitor 08/31/2015 [facility]):  - Average HR 64.  Minimum 38.  Maximum 97.  Predominant rhythm sinus.  - Single PVC's occasional; no runs.  - Frequent blocked PACs with resultant bradycardia.  No SVT or atrial fibrillation.    - Occasional shortness of breath during periods of bradycardia with blocked PACs.  - On my review of tracings, several strips appear more consistent with sinus rhythm with 2:1 block than with blocked PAC's based on timing (though P-wave morphology is subtly different with blocked beats).  On tracings with both 2:1 and 1:1 conduction, the P-wave CL remains constant and ventricular rate exactly 1/2 during 2:1 block.  When transitioning from 1:1 to 2:1 block, the first blocked P-wave comes slightly early, but possibly due to increase in sinus rate.  - Less frequently (particularly 2:26am), bradycardia appears due to blocked PAC's in bigeminy.  - One period of 3:2 conduction (9:28am) without clear PR prolongation    I reviewed the reports of the following tests:  Echocardiogram 07/30/2022 [facility]):   1. Normal LV systolic function, EF 60 to 65%.    2. Normal RV size and function.    3. Normal aortic valve.    4. Mild mitral regurgitation.    5. Mild tricuspid regurgitation.    6. No pericardial effusion.     Carotid ultrasound  07/12/2023:  Mild atherosclerotic plaque within the bilateral carotid bulbs  No hemodynamically significant stenosis.     Echocardiogram 03/06/2021 [facility]):  - LV normal size and function; no LVH; EF 60-65%; normal diastolic function; normal wall motion  - RV normal function  - LA and RA sizes normal  - AV normal; no AS or AI; aortic root size normal  - Mitral valve sclerotic; no MS or MR  - Tricuspid valve normal; no RS or TR  - No significant pericardial effusion  - IVC normal size     Echocardiogram 10/31/2018 [facility]):      Cardiac PET 02/17/2018:  1.   Normal perfusion in the left ventricle without myocardial uptake of FDG. Findings are suggestive of no active myocardial inflammatory process or scar.  2.    Normal left ventricular wall motion with a calculated  left ventricular ejection fraction of >65% on rest perfusion images.    Cardiac MRI 12/31/2017:  1. Mid wall and subepicardial late gadolinium enhancement in the lateral wall at the level of the base which can be seen with myocarditis or other inflammatory cardiomyopathy. Remaining myocardium demonstrates no evidence of scar.  2. Normal left ventricular function (ejection fraction 73%).     Echocardiogram 04/02/2017:  The patient's blood pressure was 154 mmHg/70 mmHg during the study. The heart rate during the study was 73. Color Flow Doppler was utilized for this exam. Spectral Doppler was utilized for this exam.  1. The left ventricular volume is normal. LV function is hyperdynamic. LV ejection fraction is estimated to be 70 to 75%.  2. The right ventricular volume is normal. Right ventricular function is normal.  3. Left atrial size is normal. Right atrial size is normal.  4. There is no hemodynamically significant valvular disease.  5. There is no Doppler evidence of LV diastolic dysfunction.  6. The RV systolic pressure is estimated to be 23 mmHg based on a right atrial pressure of 3 mmHg.  7. No pericardial effusion noted.  The inferior vena cava is less than 21 mm in diameter and collapses with inspiration consistent with a right atrial pressure of 3 mmHg.  8. Aortic root dimension is normal.  - Compared to the previous study on 09/23/15, the prior study is a stress echo.    Exercise stress echocardiogram 09/23/2015 [facility]):  - Resting EKG sinus rhythm with right bundle branch block   - Resting echocardiogram: Normal LV size, wall thickness, and function, EF 60-65%.  Atria normal size.  RV normal.  Mild anterior leaflet MVP and mild MR.  No other significant valvular pathology.  No pericardial effusion.  - Walked 8 min 11 sec on [place], with peak HR 118 bpm early but development of 2:1 AV block at peak exercise with HR in the 60s and associated shortness of breath (2:1 block versus atrial bigeminy with block).  Returned to 1:1 conduction in recovery.  Episodes of supraventricular bigeminy noted.  - I reviewed the tracings:  - At 2:24, there is a period of 3:2 conduction with constant P-P interval and no clear PR prolongation, that appears to persist through 5:50  - HR then drops from 89 to 74 but P-waves difficult to see  - At 6:50 there appears to be 2:1 block with occasional outflow tract PVC's  - At 7:50 there is suspicion for 2:1 conduction with blocked P-waves in prior T-waves.  - At 15 seconds into recovery there are periods with 1:1 conduction at double the rate of final exercise EKGs (5:4 and 6:5 conduction, but with little to no change in PR interval and R-R interval around blocked P-wave exactly double that of conducted intervals).  This apparent Mobitz type II second degree AV block becomes more apparent as the rate continues to slow in recovery.  - By 2 minutes into recovery there is clear 2:1 AV block that persists to 3 minutes, though with one episode of 3:2 block that appears slightly more consistent with Wenckebach based on the PR intervals and length of pause  - Stress echocardiogram:  Appropriate increase  in contractility of all segments; no evidence of ischemia.    Chest CTA 09/22/2015 [facility]):  - Negative for PE or other explanation for shortness of breath.  - Few liver lesions, probably cysts.    - Ascending and descending aorta normal caliber    PFTs  09/08/2015:  - FVC, FEV1, FEV1/FVC ratio, and FEF25-75% within normal limits  - TLC within normal limits  - FRC and RV increased  - With bronchodilators, there is significant response.  - Diffusing capacity high.  - Conclusions: Increased diffusing capacity consistent with left heart failure or shunting.  Although flow rates are within normal limits, the overinflation and response to bronchodilators are characteristic of reactive airways ("minimal")      The most recent laboratory evaluation at [facility] included the following:  Lab Results   Component Value Date/Time    NA 143 12/22/2019 05:10 AM    K 3.4 (L) 12/22/2019 05:10 AM    CL 109 12/22/2019 05:10 AM    CO2 28 12/22/2019 05:10 AM    BUN 8 12/22/2019 05:10 AM    GLU 99 12/22/2019 05:10 AM    CHOL 195 12/21/2019 10:18 AM    LDL 119 12/21/2019 10:18 AM    HDL 62 12/21/2019 10:18 AM    TG 69 12/21/2019 10:18 AM     Lab Results   Component Value Date    Creatinine 0.65 12/22/2019    Urea Nitrogen, serum/plasma 8 12/22/2019    Sodium, Serum / Plasma 143 12/22/2019    Potassium, Serum / Plasma 3.4 (L) 12/22/2019    Chloride, Serum / Plasma 109 12/22/2019    Carbon Dioxide, Total 28 12/22/2019    eGFR - high estimate 109 12/22/2019    eGFR - low estimate 94 12/22/2019     No results found for: "ALT", "AST", "ALKP", "DBILI", "TBILI", "ANA4", "TBILWB", "GGT", "GGTEXL", "GGTEXQ", "ANA5"   No results found for: "WBC", "WBCM", "WCBD", "HGB", "HGBM", "HCT", "HCTM", "NSH", "HCT22", "MCV", "PLT"  No results found for: "TSH"  No results found for: "INR", "PT", "LABPROT", "PROTIME"     ASSESSMENT AND RECOMMENDATIONS     In summary, Ms. Rothenberger is a 68 y.o. 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  She was subsequently diagnosed with paroxysmal atrial fibrillation, as well as occasional NSVT, and underwent ablation for atrial fibrillation 12/21/2019.  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    We focused our evaluation and discussion today on the following problems:    1) Atrial fibrillation:  - She had a prior history of relatively infrequent palpitations and was diagnosed with paroxysmal atrial fibrillation via device interrogation.  The burden continued to slowly increase, and she was symptomatic with longer episodes.  She decided to proceed with catheter ablation, performed 12/21/2019 -- device interrogation has shown no confirmed episodes of true atrial fibrillation since, and her symptoms have markedly improved.    We agreed on the following plan:  - We will continue to monitor arrhythmia burden via the implanted device  - She will continue metoprolol    2) NSVT:  - She had a symptomatic, 20-beat run of NSVT in 08/2017 associated with significant lightheadedness.  We initiated the metoprolol after that, with improvement in NSVT burden. Given the combination of AV block, sinus node dysfunction, paroxysmal atrial fibrillation, and NSVT, I suggested further evaluation for inflammatory cardiomyopathy such as sarcoid.  MRI showed evidence of basal-lateral mid-myocardial and epicardial LGE (with preserved LV function), and subsequent PET showed no perfusion defects or evidence  of inflammation.  The etiology of the LGE is not clear -- perhaps she had a prior episode of myocarditis.    - The combination of the LGE and NSVT is obviously concerning, though she has normal LV function, no sustained ventricular arrhythmias, and no syncope.   Since starting low dose metoprolol she has had only infrequent and very mild symptoms. There is no current indication for ICD upgrade.  - If NSVT episodes become more frequent or bothersome, it would be reasonable to try increasing the very low dose metoprolol further.    3) Stroke prevention:  - Her CHA2DS2-VASc score is 2 (age and gender).  - She stopped apixaban 3 months post-procedure, in part given her preference as someone who enjoys riding horses.  Since we are able to monitor for atrial fibrillation via the device, and her only risk factors remain age and gender, I think it is safe to remain off therapeutic anticoagulation for the time being.  We will have a low threshold to resume if she has recurrent atrial fibrillation (particularly if she is no longer riding).    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  Her ventricular pacing burden is increasing but remains under 40%.  LV function has remained normal as recently as an echocardiogram earlier this month (07/2023).  - She will continue routine pacemaker checks remotely and in the clinic.    5) Atypical chest discomfort:  - She has had intermittent discomfort in the left mid-axillary line that does not sound characteristically ischemic, particularly given the prolonged nature of episodes.  She is understandably concerned about coronary artery disease given her father's history, and her last stress test was in 2017, so she is interested in potentially pursuing another.  - She is scheduled for a pharmacologic stress test this week after discussion with the PA in her local cardiologist's office.    It is a pleasure participating in Ms. Voris's care. I spent a total of 41 minutes on her care on the day of her visit excluding time spent related to any billed procedures. This time includes face-to-face time with the patient as well as time  spent documenting in the medical record, reviewing the patient's records and tests, obtaining history, placing orders, communicating with other healthcare professionals, counseling the patient, family, or caregiver, and/or care coordination for the diagnoses above.    Please do not hesitate to contact me with any questions or concerns.    Alric Seton. Rachelle Hora, MD, [facility], [facility]  Professor of Clinical Medicine  Division of Cardiology  Cardiac Electrophysiology

## 2023-08-06 NOTE — Patient Instructions (Signed)
It was a pleasure to see you again in Electrophysiology!    Let me know when the stress test is completed so I can also follow-up the results. I will send your new cardiologist Dr. Lambert Keto a letter with recent pacemaker information.    Continue the low-dose metoprolol and let me know if you are have any more arrhythmia symptoms -- we will continue to follow your heart rhythm via regular pacemaker interrogations.    Let's plan to follow-up in 18 months, or sooner as needed.      ===========================================      MyChart Messages  For your safety and best care, please DO NOT use MyChart messages to report symptoms. (Symptoms should be reported by calling [phone]). Please use MyChart for non-urgent matters such as general questions, non-urgent prescription refills, or non-urgent scheduling.   -Please note that MyChart messages are routed to a central pool and one of your provider's team members will get back to you. Expect up to 3 business days for response.    Lab and test scheduling and results:  During your visit, your provider may order radiology exams or other tests. If you have not heard back within 1-2 weeks to be scheduled, please call:   For echocardiogram or stress-echo: [phone], Option 1.   For radiology (CT scan, MRI, PET scan, perfusion stress-test): [phone]   For other tests, call our office: [phone]  Test results may take up to 7- 10 business days and may be communicated to you in the following ways:   If you have signed up for MyChart, your results will be available on your MyChart account.   If you are scheduled for a follow up appointment after your tests are completed, your provider may wish to wait and discuss them during the follow up appointment.    We will not call you with the results of a normal test or device interrogation.     Procedure/Admission scheduling  Our EP procedure scheduling team will contact you to pick a date for your procedure or admission.  Please call [phone] to speak with the schedulers if you do not hear back in 5 business days. This is the direct scheduling line and should only be used for procedure scheduling.    Paging Operator:   If you have an urgent/emergent problem after 5pm, during the weekend, or on a holiday, please call [phone] and ask the operator to page the EP fellow on-call.    Temporary leave/disability forms directly related to EP procedures:  Please fill out all your demographic information and include anticipated work absence dates.  Please allow for a 2-week turnaround time for all paperwork submitted.   EP clinic fax: [phone]    Clinical Notes on My Chart:  Progress notes documented by your healthcare team will now be available on the MyChart portal. We believe that patients should be a part of the healthcare team. Please recognize that these notes are for documentation purposes and typos/minor errors may occur. If you identify any major discrepancies in the documentation, please let us know via MyChart or bring them to your next scheduled visit. With increased transparency, our hope is that we create more trust, better communication and increased satisfaction.    Research studies  At [facility], our primary goal is to treat your cardiac arrhythmia. We also strive to gather new information to help improve our understanding and treatment of all patient with cardiac arrhythmias. You may hear from our research coordinators  about several studies we are conducting. These may involve collecting special ECG recordings or blood samples, or data during an electrophysiology study or ablation procedure. All these protocols are discussed and approved by your electrophysiologist as well as the [place] human research committee before they are offered. We appreciate your consideration of participation in these research protocols to allow you early access to new technology and to help future patients with cardiac arrhythmias. Your  electrophysiologist is happy to discuss any specific questions you may have about participation in these protocols, either over MyChart or via video visit.      Self Scheduling  You may receive a self-scheduling ticket via MyChart that allows you to schedule your next appointment online at the most convenient time for you. Please check MyChart after the visit.

## 2023-09-30 ENCOUNTER — Ambulatory Visit
Admission: RE | Admit: 2023-09-30 | Discharge: 2023-09-30 | Disposition: A | Discharge status: Home / Self Care | Source: Ambulatory Visit | Attending: Family | Admitting: Family

## 2023-09-30 DIAGNOSIS — Z1231 Encounter for screening mammogram for malignant neoplasm of breast: Secondary | ICD-10-CM

## 2023-10-03 ENCOUNTER — Encounter: Payer: Self-pay | Admitting: Family

## 2023-10-30 ENCOUNTER — Other Ambulatory Visit: Payer: Self-pay | Admitting: Family

## 2023-10-30 DIAGNOSIS — E785 Hyperlipidemia, unspecified: Secondary | ICD-10-CM

## 2023-11-05 ENCOUNTER — Encounter: Payer: Self-pay | Admitting: *Deleted

## 2023-11-05 NOTE — Telephone Encounter (Signed)
 Ozempic  0.5mg  was received in the mail on 11/05/23 from [facility]. #4 boxes were received and placed in the fridge. Lot #: [id number] Exp: 03/24/2026 Pt is aware and will come by to pick the medication. Nothing further was needed.

## 2023-11-08 ENCOUNTER — Other Ambulatory Visit: Payer: Self-pay | Admitting: Family

## 2023-11-08 DIAGNOSIS — E1169 Type 2 diabetes mellitus with other specified complication: Secondary | ICD-10-CM

## 2023-11-16 ENCOUNTER — Other Ambulatory Visit: Payer: Self-pay | Admitting: Family

## 2023-11-16 DIAGNOSIS — I10 Essential (primary) hypertension: Secondary | ICD-10-CM

## 2024-01-02 NOTE — Telephone Encounter (Signed)
 Records requested

## 2024-03-30 ENCOUNTER — Encounter: Payer: Self-pay | Admitting: Pharmacist

## 2024-04-20 ENCOUNTER — Encounter: Payer: Self-pay | Admitting: Family

## 2024-04-20 ENCOUNTER — Ambulatory Visit: Admitting: Family

## 2024-04-20 VITALS — BP 136/90 | HR 99 | Temp 97.7°F | Ht 63.0 in | Wt 168.0 lb

## 2024-04-20 DIAGNOSIS — Z7984 Long term (current) use of oral hypoglycemic drugs: Secondary | ICD-10-CM

## 2024-04-20 DIAGNOSIS — I10 Essential (primary) hypertension: Secondary | ICD-10-CM | POA: Diagnosis not present

## 2024-04-20 DIAGNOSIS — J4520 Mild intermittent asthma, uncomplicated: Secondary | ICD-10-CM | POA: Insufficient documentation

## 2024-04-20 DIAGNOSIS — E1142 Type 2 diabetes mellitus with diabetic polyneuropathy: Secondary | ICD-10-CM | POA: Diagnosis not present

## 2024-04-20 LAB — MICROALBUMIN / CREATININE URINE RATIO
Creatinine,U: 312.2 mg/dL
Microalb Creat Ratio: 31.5 mg/g — ABNORMAL HIGH (ref 0.0–30.0)
Microalb, Ur: 9.8 mg/dL — ABNORMAL HIGH (ref 0.0–1.9)

## 2024-04-20 LAB — POCT GLYCOSYLATED HEMOGLOBIN (HGB A1C): Hemoglobin A1C: 7.7 % — AB (ref 4.0–5.6)

## 2024-04-20 MED ORDER — EMPAGLIFLOZIN 10 MG PO TABS: 10.0000 mg | ORAL_TABLET | Freq: Every day | ORAL | 3 refills | Status: DC

## 2024-04-20 MED ORDER — METFORMIN HCL ER 500 MG PO TB24: 500.0000 mg | ORAL_TABLET | Freq: Every day | ORAL | 0 refills | Status: AC

## 2024-04-20 NOTE — Progress Notes (Signed)
 j  Established Patient Office Visit  Subjective:      CC:  Chief Complaint  Patient presents with   Acute Visit    Increased wheezing and coughing for several months    HPI: Sarah Brock is a 68 y.o. female presenting on 04/20/2024 for Acute Visit (Increased wheezing and coughing for several months) .  Discussed the use of AI scribe software for clinical note transcription with the patient, who gave verbal consent to proceed.  History of Present Illness Sarah Brock is a 68 year old female with asthma who presents with persistent coughing and wheezing.  She has been experiencing persistent coughing, initially attributing these symptoms to allergies when her mother visited in May. Over time, the symptoms have worsened, with coughing triggered by various situations such as being in the car or after being questioned by the nurse. Nighttime coughing is particularly bothersome, waking her up and disturbing her son.  She has a history of asthma and has noted ear irritation for the past month, which she associates with her lifelong pattern of ear issues when unwell. Additionally, she experiences headaches twice a month for the past six months, which are severe and temporarily relieved by Tylenol . She has not checked her blood pressure during these episodes.  She experiences allergy symptoms such as itchy, watery eyes, and sneezing, which started about one to two weeks ago. She takes Zyrtec (cetirizine) every night for allergies but feels it may not be effective anymore. She also reports sinus pressure and facial congestion.  Her current medications include metformin , amlodipine , and Zyrtec. She has not used Flonase recently but did many years ago. She has been taking lisinopril  for years and notes that the coughing is a recent development. She experiences occasional heartburn and acid reflux, which are not persistent. She stopped taking Ozempic  three weeks ago due to severe  abdominal discomfort.         Social history:  Relevant past medical, surgical, family and social history reviewed and updated as indicated. Interim medical history since our last visit reviewed.  Allergies and medications reviewed and updated.  DATA REVIEWED: CHART IN EPIC     ROS: Negative unless specifically indicated above in HPI.    Current Outpatient Medications:    Accu-Chek FastClix Lancets MISC, Use to test blood sugar twice daily. Dx: E11.9, Disp: 200 each, Rfl: 3   amLODipine  (NORVASC ) 10 MG tablet, TAKE 1 TABLET EVERY DAY, Disp: 90 tablet, Rfl: 3   BIOTIN PO, Take 1 tablet by mouth daily., Disp: , Rfl:    Blood Glucose Monitoring Suppl (ACCU-CHEK GUIDE) w/Device KIT, Use to test blood sugar twice daily. Dx: E11.9, Disp: 1 kit, Rfl: 0   ciclopirox  (PENLAC ) 8 % solution, Apply topically at bedtime. Apply over nail and surrounding skin. Apply daily over previous coat. After seven (7) days, may remove with alcohol and continue cycle., Disp: 6.6 mL, Rfl: 0   COLLAGEN PO, Take by mouth daily. Powder formula, adds to coffee, Disp: , Rfl:    empagliflozin (JARDIANCE) 10 MG TABS tablet, Take 1 tablet (10 mg total) by mouth daily., Disp: 90 tablet, Rfl: 3   ezetimibe  (ZETIA ) 10 MG tablet, TAKE 1 TABLET EVERY DAY, Disp: 90 tablet, Rfl: 3   ferrous sulfate  325 (65 FE) MG tablet, Take 1 tablet (325 mg total) by mouth 2 (two) times daily with a meal., Disp: 60 tablet, Rfl: 2   fexofenadine (ALLEGRA) 180 MG tablet, Take 180 mg by mouth daily., Disp: ,  Rfl:    glucose blood (ACCU-CHEK GUIDE) test strip, Use to test blood sugar twice daily. Dx: E11.9, Disp: 200 each, Rfl: 3   Lancets Misc. (ACCU-CHEK FASTCLIX LANCET) KIT, by Does not apply route., Disp: , Rfl:    levETIRAcetam  (KEPPRA ) 500 MG tablet, TAKE 1 TABLET EVERY 12 HOURS, Disp: 180 tablet, Rfl: 3   lisinopril  (ZESTRIL ) 30 MG tablet, TAKE 1 TABLET EVERY DAY, Disp: 90 tablet, Rfl: 3   rosuvastatin  (CRESTOR ) 5 MG tablet, TAKE  1/2 TABLET AT BEDTIME FOR CHOLESTEROL, Disp: 45 tablet, Rfl: 3   metFORMIN  (GLUCOPHAGE -XR) 500 MG 24 hr tablet, Take 1 tablet (500 mg total) by mouth daily with breakfast., Disp: 360 tablet, Rfl: 0        Objective:        BP (!) 136/90 (BP Location: Left Arm, Patient Position: Sitting, Cuff Size: Normal)   Pulse 99   Temp 97.7 F (36.5 C) (Temporal)   Ht 5' 3 (1.6 m)   Wt 168 lb (76.2 kg)   LMP  (LMP Unknown)   SpO2 98%   BMI 29.76 kg/m   Physical Exam HEENT: Nasal mucosa swollen, significant allergies. No sinus tenderness. Allergic shiners present. CHEST: Lungs clear to auscultation bilaterally.  Wt Readings from Last 3 Encounters:  04/20/24 168 lb (76.2 kg)  03/14/23 158 lb 9.6 oz (71.9 kg)  08/15/22 156 lb (70.8 kg)    Physical Exam Vitals reviewed.  Constitutional:      General: She is not in acute distress.    Appearance: Normal appearance. She is normal weight. She is not ill-appearing, toxic-appearing or diaphoretic.  HENT:     Head: Normocephalic.     Right Ear: Tympanic membrane normal.     Left Ear: Tympanic membrane normal.     Nose: Nose normal.     Right Turbinates: Swollen.     Mouth/Throat:     Mouth: Mucous membranes are dry.     Pharynx: No oropharyngeal exudate or posterior oropharyngeal erythema.     Comments: Geographic tongue Eyes:     Extraocular Movements: Extraocular movements intact.     Pupils: Pupils are equal, round, and reactive to light.  Cardiovascular:     Rate and Rhythm: Normal rate and regular rhythm.     Pulses: Normal pulses.     Heart sounds: Normal heart sounds.  Pulmonary:     Effort: Pulmonary effort is normal.     Breath sounds: Normal breath sounds.  Musculoskeletal:     Cervical back: Normal range of motion.  Neurological:     General: No focal deficit present.     Mental Status: She is alert and oriented to person, place, and time. Mental status is at baseline.  Psychiatric:        Mood and Affect: Mood  normal.        Behavior: Behavior normal.        Thought Content: Thought content normal.        Judgment: Judgment normal.          Results   Assessment & Plan:   Assessment and Plan Assessment & Plan Allergic rhinitis with nasal congestion and postnasal drainage Significant nasal congestion and postnasal drainage likely due to allergies. Nasal examination reveals swelling and inflamed sacs. Allergic shiners present. Symptoms persist despite Zyrtec, indicating possible tolerance. - Switch from Zyrtec to Allegra for allergy management. - Consider using Flonase for nasal congestion. - Recommend changing air filters and using air purifiers in main rooms. -  Advise warm washing of pillows and using allergen covers.  Chronic cough and intermittent wheezing likely secondary to allergies Chronic cough and wheezing likely exacerbated by postnasal drainage from allergic rhinitis. Symptoms have worsened recently, affecting sleep and daily activities. No wheezing or crackles on examination. - Switch from Zyrtec to Allegra to address potential tolerance. - Consider using Flonase to reduce nasal inflammation and postnasal drip.  Recurrent headaches Recurrent severe headaches occurring twice a month for several months. Sinus pressure and congestion in the face noted.suspect this is due to allergic rhinitis   Type 2 diabetes mellitus without complication Type 2 diabetes mellitus without complication. Recent discontinuation of Ozempic  due to adverse gastrointestinal effects. - Checked A1c, increased to 7.7 from 6.2 Pt self d/c ozempic  due to side effects, still taking metformin  1000 mg twice daily.  -will start jardiance 10 mg once daily. Advised pt to monitor for candida s/e advised proper hygiene to try to keep this risk less likely.  -f/u 3 months for diabetes management -stop metformin , start metformin  500 mg two tablets twice daily (XR)   Essential hypertension Essential hypertension  managed with lisinopril . Pt advised of the following:  Continue medication as prescribed. Monitor blood pressure periodically and/or when you feel symptomatic. Goal is <130/90 on average. Ensure that you have rested for 30 minutes prior to checking your blood pressure. Record your readings and bring them to your next visit if necessary.work on a low sodium diet.  Hyperlipidemia associated with type 2 diabetes mellitus Hyperlipidemia associated with type 2 diabetes mellitus.     Return for f/u CPE.     Ginger Patrick, MSN, APRN, FNP-C [facility]

## 2024-04-21 ENCOUNTER — Ambulatory Visit: Payer: Self-pay | Admitting: Family

## 2024-05-24 ENCOUNTER — Encounter: Payer: Self-pay | Admitting: Family

## 2024-05-24 DIAGNOSIS — E1142 Type 2 diabetes mellitus with diabetic polyneuropathy: Secondary | ICD-10-CM

## 2024-05-25 MED ORDER — DAPAGLIFLOZIN PROPANEDIOL 5 MG PO TABS: 5.0000 mg | ORAL_TABLET | Freq: Every day | ORAL | 3 refills | Status: AC

## 2024-08-13 ENCOUNTER — Ambulatory Visit

## 2024-09-01 ENCOUNTER — Encounter: Admitting: Family
